# Patient Record
Sex: Male | Born: 1945 | Race: White | Hispanic: No | Marital: Married | Smoking: Former smoker
Health system: Southern US, Community
[De-identification: ages and names within clinical notes are randomized; demographics above are authoritative.]

## PROBLEM LIST (undated history)

## (undated) DIAGNOSIS — F329 Major depressive disorder, single episode, unspecified: Secondary | ICD-10-CM

## (undated) DIAGNOSIS — K219 Gastro-esophageal reflux disease without esophagitis: Secondary | ICD-10-CM

## (undated) DIAGNOSIS — M1990 Unspecified osteoarthritis, unspecified site: Secondary | ICD-10-CM

## (undated) DIAGNOSIS — M549 Dorsalgia, unspecified: Secondary | ICD-10-CM

## (undated) DIAGNOSIS — G8929 Other chronic pain: Secondary | ICD-10-CM

## (undated) DIAGNOSIS — F1910 Other psychoactive substance abuse, uncomplicated: Secondary | ICD-10-CM

## (undated) DIAGNOSIS — T4145XA Adverse effect of unspecified anesthetic, initial encounter: Secondary | ICD-10-CM

## (undated) DIAGNOSIS — N179 Acute kidney failure, unspecified: Secondary | ICD-10-CM

## (undated) DIAGNOSIS — I10 Essential (primary) hypertension: Secondary | ICD-10-CM

## (undated) DIAGNOSIS — C61 Malignant neoplasm of prostate: Secondary | ICD-10-CM

## (undated) DIAGNOSIS — Z5189 Encounter for other specified aftercare: Secondary | ICD-10-CM

## (undated) DIAGNOSIS — H269 Unspecified cataract: Secondary | ICD-10-CM

## (undated) DIAGNOSIS — I6529 Occlusion and stenosis of unspecified carotid artery: Secondary | ICD-10-CM

## (undated) DIAGNOSIS — H9190 Unspecified hearing loss, unspecified ear: Secondary | ICD-10-CM

## (undated) DIAGNOSIS — I639 Cerebral infarction, unspecified: Secondary | ICD-10-CM

## (undated) DIAGNOSIS — E785 Hyperlipidemia, unspecified: Secondary | ICD-10-CM

## (undated) DIAGNOSIS — F4310 Post-traumatic stress disorder, unspecified: Secondary | ICD-10-CM

## (undated) DIAGNOSIS — F319 Bipolar disorder, unspecified: Secondary | ICD-10-CM

## (undated) DIAGNOSIS — F32A Depression, unspecified: Secondary | ICD-10-CM

## (undated) DIAGNOSIS — G459 Transient cerebral ischemic attack, unspecified: Secondary | ICD-10-CM

## (undated) DIAGNOSIS — D649 Anemia, unspecified: Secondary | ICD-10-CM

## (undated) HISTORY — DX: Dorsalgia, unspecified: M54.9

## (undated) HISTORY — PX: COLONOSCOPY: SHX174

## (undated) HISTORY — DX: Unspecified hearing loss, unspecified ear: H91.90

## (undated) HISTORY — DX: Gastro-esophageal reflux disease without esophagitis: K21.9

## (undated) HISTORY — DX: Other psychoactive substance abuse, uncomplicated: F19.10

## (undated) HISTORY — DX: Encounter for other specified aftercare: Z51.89

## (undated) HISTORY — DX: Post-traumatic stress disorder, unspecified: F43.10

## (undated) HISTORY — DX: Occlusion and stenosis of unspecified carotid artery: I65.29

## (undated) HISTORY — DX: Hyperlipidemia, unspecified: E78.5

## (undated) HISTORY — PX: BACK SURGERY: SHX140

## (undated) HISTORY — DX: Unspecified cataract: H26.9

## (undated) HISTORY — PX: POLYPECTOMY: SHX149

## (undated) HISTORY — DX: Other chronic pain: G89.29

## (undated) HISTORY — PX: CARDIAC CATHETERIZATION: SHX172

## (undated) HISTORY — PX: EYE SURGERY: SHX253

## (undated) HISTORY — PX: PROSTATE SURGERY: SHX751

---

## 1998-07-05 DIAGNOSIS — T8859XA Other complications of anesthesia, initial encounter: Secondary | ICD-10-CM

## 1998-07-05 HISTORY — DX: Other complications of anesthesia, initial encounter: T88.59XA

## 2012-11-19 ENCOUNTER — Emergency Department (HOSPITAL_COMMUNITY)
Admission: EM | Admit: 2012-11-19 | Discharge: 2012-11-19 | Disposition: A | Discharge status: Home / Self Care | Payer: Medicare Other | Attending: Emergency Medicine | Admitting: Emergency Medicine

## 2012-11-19 ENCOUNTER — Encounter (HOSPITAL_COMMUNITY): Payer: Self-pay | Admitting: Family Medicine

## 2012-11-19 ENCOUNTER — Emergency Department (HOSPITAL_COMMUNITY): Payer: Medicare Other

## 2012-11-19 DIAGNOSIS — Z8546 Personal history of malignant neoplasm of prostate: Secondary | ICD-10-CM | POA: Insufficient documentation

## 2012-11-19 DIAGNOSIS — N453 Epididymo-orchitis: Secondary | ICD-10-CM | POA: Insufficient documentation

## 2012-11-19 DIAGNOSIS — Z8673 Personal history of transient ischemic attack (TIA), and cerebral infarction without residual deficits: Secondary | ICD-10-CM | POA: Insufficient documentation

## 2012-11-19 DIAGNOSIS — K219 Gastro-esophageal reflux disease without esophagitis: Secondary | ICD-10-CM | POA: Insufficient documentation

## 2012-11-19 DIAGNOSIS — N451 Epididymitis: Secondary | ICD-10-CM

## 2012-11-19 DIAGNOSIS — Z791 Long term (current) use of non-steroidal anti-inflammatories (NSAID): Secondary | ICD-10-CM | POA: Insufficient documentation

## 2012-11-19 DIAGNOSIS — Z880 Allergy status to penicillin: Secondary | ICD-10-CM | POA: Insufficient documentation

## 2012-11-19 DIAGNOSIS — N509 Disorder of male genital organs, unspecified: Secondary | ICD-10-CM | POA: Insufficient documentation

## 2012-11-19 DIAGNOSIS — N5089 Other specified disorders of the male genital organs: Secondary | ICD-10-CM | POA: Insufficient documentation

## 2012-11-19 DIAGNOSIS — Z87891 Personal history of nicotine dependence: Secondary | ICD-10-CM | POA: Insufficient documentation

## 2012-11-19 DIAGNOSIS — Z79899 Other long term (current) drug therapy: Secondary | ICD-10-CM | POA: Insufficient documentation

## 2012-11-19 DIAGNOSIS — F319 Bipolar disorder, unspecified: Secondary | ICD-10-CM | POA: Insufficient documentation

## 2012-11-19 DIAGNOSIS — I10 Essential (primary) hypertension: Secondary | ICD-10-CM | POA: Insufficient documentation

## 2012-11-19 HISTORY — DX: Major depressive disorder, single episode, unspecified: F32.9

## 2012-11-19 HISTORY — DX: Cerebral infarction, unspecified: I63.9

## 2012-11-19 HISTORY — DX: Gastro-esophageal reflux disease without esophagitis: K21.9

## 2012-11-19 HISTORY — DX: Malignant neoplasm of prostate: C61

## 2012-11-19 HISTORY — DX: Depression, unspecified: F32.A

## 2012-11-19 HISTORY — DX: Essential (primary) hypertension: I10

## 2012-11-19 HISTORY — DX: Bipolar disorder, unspecified: F31.9

## 2012-11-19 LAB — URINALYSIS, ROUTINE W REFLEX MICROSCOPIC
Bilirubin Urine: NEGATIVE
Hgb urine dipstick: NEGATIVE
Specific Gravity, Urine: 1.027 (ref 1.005–1.030)
pH: 6 (ref 5.0–8.0)

## 2012-11-19 MED ORDER — MORPHINE SULFATE 4 MG/ML IJ SOLN
4.0000 mg | Freq: Once | INTRAMUSCULAR | Status: AC
  Administered 2012-11-19: 4 mg via INTRAVENOUS
  Filled 2012-11-19: qty 1

## 2012-11-19 MED ORDER — SODIUM CHLORIDE 0.9 % IV BOLUS (SEPSIS)
1000.0000 mL | Freq: Once | INTRAVENOUS | Status: AC
  Administered 2012-11-19: 1000 mL via INTRAVENOUS

## 2012-11-19 MED ORDER — LEVOFLOXACIN 500 MG PO TABS: 500.0000 mg | ORAL_TABLET | Freq: Every day | ORAL | Status: AC

## 2012-11-19 NOTE — ED Notes (Signed)
Per pt sts groin swelling and testicle swelling since yesterday. sts also red and warm to touch.

## 2012-11-19 NOTE — ED Notes (Signed)
Pt states understanding of discharge instruction 

## 2012-11-19 NOTE — ED Provider Notes (Signed)
I saw and evaluated the patient, reviewed the resident's note and I agree with the findings and plan.  Patient with rather sudden pain to the side of right testicle that began yesterday, has gradually been worsening. No penile discharge, ulcerations, discharge. Patient has a history of prostate cancer status post prostatectomy. He reports no significant history of urinary tract infection. He denies any fever or chills. No abdominal pain. On exam, right scrotum is mildly swollen with area of erythema most in the proximal lateral portion of the scrotum. No tenderness along his perineum. His penis is normal. I doubt portion, suspect more likely a mild cellulitis versus epididymitis. No abscess is palpable. Plan is to obtain a ultrasound, likely will require antibiotics but I suspect the patient is stable for outpatient followup.    Jason [name]. Oletta Lamas, MD 11/19/12 1801

## 2012-11-19 NOTE — ED Provider Notes (Signed)
History     CSN: [id number]  Arrival date & time 11/19/12  1645   First MD Initiated Contact with Patient 11/19/12 1707      Chief Complaint  Patient presents with  . Groin Swelling    (Consider location/radiation/quality/duration/timing/severity/associated sxs/prior treatment) HPI Comments: 67 y.o. male who presents to the Er w/ the cc of right testicular pain and swelling. Started yesterday. He has not had fevers or chills. States that has noticed right testicular pain and redness overlying his right scrotum. He does not have hx of torsion or diabetes.   Patient is a 67 y.o. male presenting with general illness. The history is provided by the patient.  Illness  The current episode started yesterday. The onset was gradual. The problem occurs rarely. The problem has been unchanged. The problem is mild. Nothing relieves the symptoms. Nothing aggravates the symptoms. Pertinent negatives include no fever, no abdominal pain, no constipation, no diarrhea, no nausea, no vomiting, no headaches, no mouth sores, no neck pain, no wheezing, no rash, no eye discharge and no eye pain.    Past Medical History  Diagnosis Date  . Hypertension   . Stroke   . Prostate cancer   . Depression   . Bipolar 1 disorder   . Acid reflux disease with ulcer     Past Surgical History  Procedure Laterality Date  . Prostate surgery      History reviewed. No pertinent family history.  History  Substance Use Topics  . Smoking status: Former Games developer  . Smokeless tobacco: Not on file  . Alcohol Use: No      Review of Systems  Constitutional: Negative for fever, chills and activity change.  HENT: Negative for drooling, mouth sores and neck pain.   Eyes: Negative for pain and discharge.  Respiratory: Negative for chest tightness, shortness of breath and wheezing.   Gastrointestinal: Negative for nausea, vomiting, abdominal pain, diarrhea and constipation.  Genitourinary: Positive for scrotal swelling  and testicular pain. Negative for dysuria, flank pain and difficulty urinating.  Musculoskeletal: Negative for back pain and arthralgias.  Skin: Negative for color change, pallor and rash.  Neurological: Negative for dizziness, weakness, light-headedness and headaches.  Psychiatric/Behavioral: Negative for behavioral problems, confusion and agitation.    Allergies  Penicillins  Home Medications   Current Outpatient Rx  Name  Route  Sig  Dispense  Refill  . divalproex (DEPAKOTE ER) 250 MG 24 hr tablet   Oral   Take 1,750 mg by mouth every evening.         Marland Kitchen FLUoxetine (PROZAC) 20 MG capsule   Oral   Take 60 mg by mouth daily.         Marland Kitchen glucosamine-chondroitin 500-400 MG tablet   Oral   Take 1 tablet by mouth 3 (three) times daily.         Marland Kitchen HYDROcodone-acetaminophen (NORCO/VICODIN) 5-325 MG per tablet   Oral   Take 2 tablets by mouth every 8 (eight) hours as needed for pain.         Marland Kitchen lisinopril-hydrochlorothiazide (PRINZIDE,ZESTORETIC) 20-12.5 MG per tablet   Oral   Take 1 tablet by mouth daily.         . mirtazapine (REMERON) 45 MG tablet   Oral   Take 45 mg by mouth at bedtime.         . niacin (SLO-NIACIN) 500 MG tablet   Oral   Take 1,000 mg by mouth at bedtime.         Marland Kitchen  omeprazole (PRILOSEC) 20 MG capsule   Oral   Take 40 mg by mouth daily.         . salsalate (DISALCID) 750 MG tablet   Oral   Take 1,500 mg by mouth 2 (two) times daily.         . simvastatin (ZOCOR) 80 MG tablet   Oral   Take 120 mg by mouth at bedtime.           BP 133/82  Pulse 101  Temp(Src) 97.9 F (36.6 C) (Oral)  Resp 18  SpO2 96%  Physical Exam  Constitutional: He is oriented to person, place, and time. He appears well-developed. No distress.  HENT:  Head: Normocephalic.  Eyes: Pupils are equal, round, and reactive to light. Right eye exhibits no discharge. Left eye exhibits no discharge.  Neck: Neck supple. No tracheal deviation present.   Cardiovascular: Regular rhythm and intact distal pulses.   Pulmonary/Chest: Effort normal. No stridor. No respiratory distress. He has no wheezes.  Abdominal: Soft. He exhibits no distension. There is no tenderness. There is no rebound.  Genitourinary:  Right testicle is ttp, he has increased fullness of his right scrotum, with overlying erythema on right side of scrotum.   Musculoskeletal: Normal range of motion. He exhibits no tenderness.  Neurological: He is alert and oriented to person, place, and time. No cranial nerve deficit.  Skin: Skin is warm. No rash noted. He is not diaphoretic. No erythema.    ED Course  Procedures (including critical care time)  Labs Reviewed  GC/CHLAMYDIA PROBE AMP  URINALYSIS, ROUTINE W REFLEX MICROSCOPIC   US Scrotum  11/19/2012   *RADIOLOGY REPORT*  Clinical Data:  Right-sided scrotal pain and swelling.  SCROTAL ULTRASOUND DOPPLER ULTRASOUND OF THE TESTICLES  Technique: Complete ultrasound examination of the testicles, epididymis, and other scrotal structures was performed.  Color and spectral Doppler ultrasound were also utilized to evaluate blood flow to the testicles.  Comparison:  None  Findings:  Right testis:  3.4 x 2.0 x 2.3 cm.  No evidence of testicular mass or microlithiasis. Internal blood flow is seen on color Doppler ultrasound.  Left testis:  3.6 x 2.2 x 2.3 cm.  No evidence of testicular mass or microlithiasis.  Internal blood flow is seen on color Doppler ultrasound.  Right epididymis:  Enlarged right epididymis is seen with increased blood flow on color Doppler ultrasound, consistent with epididymitis.  Multiple epididymal cysts or spermatoceles also noted.  Left epididymis:  Normal in size.  No increased blood flow seen. Several tiny less than 1 cm epididymal cysts or spermatoceles noted.  Hydrocele:  Moderate right and small left hydrocele is noted.  Varicocele:  None visualized  Pulsed Doppler interrogation of both testes demonstrates low  resistance flow bilaterally.  IMPRESSION:  1.  No evidence of testicular mass or torsion. 2.  Right-sided epididymitis. 3.  Moderate right and small left hydroceles.   Original Report Authenticated By: Myles Rosenthal, M.D.   [place] Art/ven Flow Abd Pelv Doppler  11/19/2012   *RADIOLOGY REPORT*  Clinical Data:  Right-sided scrotal pain and swelling.  SCROTAL ULTRASOUND DOPPLER ULTRASOUND OF THE TESTICLES  Technique: Complete ultrasound examination of the testicles, epididymis, and other scrotal structures was performed.  Color and spectral Doppler ultrasound were also utilized to evaluate blood flow to the testicles.  Comparison:  None  Findings:  Right testis:  3.4 x 2.0 x 2.3 cm.  No evidence of testicular mass or microlithiasis. Internal blood flow is seen on  color Doppler ultrasound.  Left testis:  3.6 x 2.2 x 2.3 cm.  No evidence of testicular mass or microlithiasis.  Internal blood flow is seen on color Doppler ultrasound.  Right epididymis:  Enlarged right epididymis is seen with increased blood flow on color Doppler ultrasound, consistent with epididymitis.  Multiple epididymal cysts or spermatoceles also noted.  Left epididymis:  Normal in size.  No increased blood flow seen. Several tiny less than 1 cm epididymal cysts or spermatoceles noted.  Hydrocele:  Moderate right and small left hydrocele is noted.  Varicocele:  None visualized  Pulsed Doppler interrogation of both testes demonstrates low resistance flow bilaterally.  IMPRESSION:  1.  No evidence of testicular mass or torsion. 2.  Right-sided epididymitis. 3.  Moderate right and small left hydroceles.   Original Report Authenticated By: Myles Rosenthal, M.D.     MDM  Suspect epidymidis verse variocele, lower suspicion for torsion -- however, will get U/S to rule out torsion and to further eval right testicular pain. No signs of necrotizing fascitis on exam, no crepitus appreciated.   U/S does not show torsion, shows epididymitis. Will tx w/ levofloxacin,  pt does not have prostate (s/p prostatectomy). Have told to f/u with his pcp within 2 days for re-evaluation of symptoms.    1. Epididymitis, right            Bernadene Person, MD 11/19/12 2012

## 2013-02-12 ENCOUNTER — Encounter (HOSPITAL_COMMUNITY): Payer: Self-pay | Admitting: Emergency Medicine

## 2013-02-12 ENCOUNTER — Inpatient Hospital Stay (HOSPITAL_COMMUNITY)
Admission: EM | Admit: 2013-02-12 | Discharge: 2013-02-14 | DRG: 195 | Disposition: A | Discharge status: Home / Self Care | Payer: Medicare Other | Attending: Family Medicine | Admitting: Family Medicine

## 2013-02-12 ENCOUNTER — Emergency Department (HOSPITAL_COMMUNITY): Payer: Medicare Other

## 2013-02-12 DIAGNOSIS — Z8546 Personal history of malignant neoplasm of prostate: Secondary | ICD-10-CM

## 2013-02-12 DIAGNOSIS — F329 Major depressive disorder, single episode, unspecified: Secondary | ICD-10-CM | POA: Diagnosis present

## 2013-02-12 DIAGNOSIS — I10 Essential (primary) hypertension: Secondary | ICD-10-CM | POA: Diagnosis present

## 2013-02-12 DIAGNOSIS — K219 Gastro-esophageal reflux disease without esophagitis: Secondary | ICD-10-CM | POA: Diagnosis present

## 2013-02-12 DIAGNOSIS — R112 Nausea with vomiting, unspecified: Secondary | ICD-10-CM | POA: Diagnosis present

## 2013-02-12 DIAGNOSIS — Z880 Allergy status to penicillin: Secondary | ICD-10-CM

## 2013-02-12 DIAGNOSIS — I69993 Ataxia following unspecified cerebrovascular disease: Secondary | ICD-10-CM

## 2013-02-12 DIAGNOSIS — Z9181 History of falling: Secondary | ICD-10-CM

## 2013-02-12 DIAGNOSIS — Z8673 Personal history of transient ischemic attack (TIA), and cerebral infarction without residual deficits: Secondary | ICD-10-CM

## 2013-02-12 DIAGNOSIS — E785 Hyperlipidemia, unspecified: Secondary | ICD-10-CM | POA: Diagnosis present

## 2013-02-12 DIAGNOSIS — M7918 Myalgia, other site: Secondary | ICD-10-CM | POA: Diagnosis present

## 2013-02-12 DIAGNOSIS — F32A Depression, unspecified: Secondary | ICD-10-CM | POA: Diagnosis present

## 2013-02-12 DIAGNOSIS — J189 Pneumonia, unspecified organism: Principal | ICD-10-CM | POA: Diagnosis present

## 2013-02-12 DIAGNOSIS — R2689 Other abnormalities of gait and mobility: Secondary | ICD-10-CM | POA: Diagnosis present

## 2013-02-12 DIAGNOSIS — Z87891 Personal history of nicotine dependence: Secondary | ICD-10-CM

## 2013-02-12 DIAGNOSIS — F319 Bipolar disorder, unspecified: Secondary | ICD-10-CM | POA: Diagnosis present

## 2013-02-12 LAB — COMPREHENSIVE METABOLIC PANEL
Albumin: 3.1 g/dL — ABNORMAL LOW (ref 3.5–5.2)
Alkaline Phosphatase: 88 U/L (ref 39–117)
BUN: 26 mg/dL — ABNORMAL HIGH (ref 6–23)
Chloride: 100 mEq/L (ref 96–112)
GFR calc Af Amer: 75 mL/min — ABNORMAL LOW (ref >90)
Glucose, Bld: 120 mg/dL — ABNORMAL HIGH (ref 70–99)
Potassium: 3.6 mEq/L (ref 3.5–5.1)
Total Bilirubin: 0.2 mg/dL — ABNORMAL LOW (ref 0.3–1.2)

## 2013-02-12 LAB — CREATININE, SERUM
Creatinine, Ser: 1.06 mg/dL (ref 0.50–1.35)
GFR calc Af Amer: 83 mL/min — ABNORMAL LOW (ref >90)
GFR calc non Af Amer: 72 mL/min — ABNORMAL LOW (ref >90)

## 2013-02-12 LAB — CBC WITH DIFFERENTIAL/PLATELET
Basophils Relative: 0 % (ref 0–1)
Eosinophils Absolute: 0.1 10*3/uL (ref 0.0–0.7)
Eosinophils Relative: 1 % (ref 0–5)
Hemoglobin: 12.6 g/dL — ABNORMAL LOW (ref 13.0–17.0)
MCH: 32.1 pg (ref 26.0–34.0)
MCHC: 34.2 g/dL (ref 30.0–36.0)
MCV: 93.9 fL (ref 78.0–100.0)
Monocytes Absolute: 1.5 10*3/uL — ABNORMAL HIGH (ref 0.1–1.0)
Monocytes Relative: 12 % (ref 3–12)
Neutrophils Relative %: 76 % (ref 43–77)

## 2013-02-12 LAB — CBC
Hemoglobin: 11.8 g/dL — ABNORMAL LOW (ref 13.0–17.0)
RBC: 3.91 MIL/uL — ABNORMAL LOW (ref 4.22–5.81)
WBC: 11.9 10*3/uL — ABNORMAL HIGH (ref 4.0–10.5)

## 2013-02-12 LAB — URINALYSIS, DIPSTICK ONLY
Ketones, ur: 15 mg/dL — AB
Nitrite: NEGATIVE
Protein, ur: NEGATIVE mg/dL
Urobilinogen, UA: 1 mg/dL (ref 0.0–1.0)
pH: 5 (ref 5.0–8.0)

## 2013-02-12 LAB — POCT I-STAT TROPONIN I: Troponin i, poc: 0 ng/mL (ref 0.00–0.08)

## 2013-02-12 MED ORDER — SODIUM CHLORIDE 0.9 % IV SOLN
INTRAVENOUS | Status: DC
  Administered 2013-02-12 – 2013-02-14 (×5): via INTRAVENOUS

## 2013-02-12 MED ORDER — LEVOFLOXACIN IN D5W 750 MG/150ML IV SOLN
750.0000 mg | INTRAVENOUS | Status: DC
  Administered 2013-02-13: 750 mg via INTRAVENOUS
  Filled 2013-02-12 (×2): qty 150

## 2013-02-12 MED ORDER — MIRTAZAPINE 45 MG PO TABS
45.0000 mg | ORAL_TABLET | Freq: Every day | ORAL | Status: DC
  Administered 2013-02-12 – 2013-02-13 (×2): 45 mg via ORAL
  Filled 2013-02-12 (×3): qty 1

## 2013-02-12 MED ORDER — DOCUSATE SODIUM 100 MG PO CAPS
100.0000 mg | ORAL_CAPSULE | Freq: Two times a day (BID) | ORAL | Status: DC | PRN
  Filled 2013-02-12: qty 1

## 2013-02-12 MED ORDER — FLUOXETINE HCL 20 MG PO CAPS
60.0000 mg | ORAL_CAPSULE | Freq: Every day | ORAL | Status: DC
  Administered 2013-02-12 – 2013-02-14 (×3): 60 mg via ORAL
  Filled 2013-02-12 (×3): qty 3

## 2013-02-12 MED ORDER — HYDROCODONE-ACETAMINOPHEN 5-325 MG PO TABS
2.0000 | ORAL_TABLET | Freq: Three times a day (TID) | ORAL | Status: DC | PRN
  Administered 2013-02-12: 2 via ORAL
  Filled 2013-02-12: qty 2

## 2013-02-12 MED ORDER — LISINOPRIL-HYDROCHLOROTHIAZIDE 20-12.5 MG PO TABS: 1.0000 | ORAL_TABLET | Freq: Every day | ORAL | Status: DC

## 2013-02-12 MED ORDER — SODIUM CHLORIDE 0.9 % IV BOLUS (SEPSIS)
1000.0000 mL | Freq: Once | INTRAVENOUS | Status: AC
  Administered 2013-02-12: 1000 mL via INTRAVENOUS

## 2013-02-12 MED ORDER — SODIUM CHLORIDE 0.9 % IV SOLN
INTRAVENOUS | Status: AC
  Administered 2013-02-12: 18:00:00 via INTRAVENOUS

## 2013-02-12 MED ORDER — SODIUM CHLORIDE 0.9 % IJ SOLN
3.0000 mL | Freq: Two times a day (BID) | INTRAMUSCULAR | Status: DC
  Administered 2013-02-12: 3 mL via INTRAVENOUS

## 2013-02-12 MED ORDER — LISINOPRIL 20 MG PO TABS
20.0000 mg | ORAL_TABLET | Freq: Every day | ORAL | Status: DC
  Administered 2013-02-12 – 2013-02-14 (×3): 20 mg via ORAL
  Filled 2013-02-12 (×3): qty 1

## 2013-02-12 MED ORDER — BIOTENE DRY MOUTH MT LIQD
15.0000 mL | Freq: Two times a day (BID) | OROMUCOSAL | Status: DC
  Administered 2013-02-13 – 2013-02-14 (×2): 15 mL via OROMUCOSAL

## 2013-02-12 MED ORDER — HEPARIN SODIUM (PORCINE) 5000 UNIT/ML IJ SOLN
5000.0000 [IU] | Freq: Three times a day (TID) | INTRAMUSCULAR | Status: DC
  Administered 2013-02-12 – 2013-02-14 (×7): 5000 [IU] via SUBCUTANEOUS
  Filled 2013-02-12 (×9): qty 1

## 2013-02-12 MED ORDER — ACETAMINOPHEN 650 MG RE SUPP: 650.0000 mg | Freq: Four times a day (QID) | RECTAL | Status: DC | PRN

## 2013-02-12 MED ORDER — ASPIRIN EC 81 MG PO TBEC
81.0000 mg | DELAYED_RELEASE_TABLET | Freq: Every day | ORAL | Status: DC
  Administered 2013-02-12 – 2013-02-14 (×3): 81 mg via ORAL
  Filled 2013-02-12 (×3): qty 1

## 2013-02-12 MED ORDER — DIVALPROEX SODIUM ER 500 MG PO TB24
1750.0000 mg | ORAL_TABLET | Freq: Every evening | ORAL | Status: DC
  Administered 2013-02-12 – 2013-02-13 (×2): 1750 mg via ORAL
  Filled 2013-02-12 (×3): qty 1

## 2013-02-12 MED ORDER — ONDANSETRON HCL 4 MG/2ML IJ SOLN: 4.0000 mg | Freq: Four times a day (QID) | INTRAMUSCULAR | Status: DC | PRN

## 2013-02-12 MED ORDER — ASPIRIN 325 MG PO TABS
325.0000 mg | ORAL_TABLET | Freq: Every day | ORAL | Status: DC
  Administered 2013-02-12: 325 mg via ORAL
  Filled 2013-02-12: qty 1

## 2013-02-12 MED ORDER — ONDANSETRON HCL 4 MG PO TABS: 4.0000 mg | ORAL_TABLET | Freq: Four times a day (QID) | ORAL | Status: DC | PRN

## 2013-02-12 MED ORDER — SALSALATE 750 MG PO TABS
1500.0000 mg | ORAL_TABLET | Freq: Two times a day (BID) | ORAL | Status: DC
  Administered 2013-02-12 – 2013-02-14 (×4): 1500 mg via ORAL
  Filled 2013-02-12 (×5): qty 2

## 2013-02-12 MED ORDER — ATORVASTATIN CALCIUM 40 MG PO TABS
40.0000 mg | ORAL_TABLET | Freq: Every day | ORAL | Status: DC
  Administered 2013-02-12 – 2013-02-13 (×2): 40 mg via ORAL
  Filled 2013-02-12 (×3): qty 1

## 2013-02-12 MED ORDER — ACETAMINOPHEN 325 MG PO TABS: 650.0000 mg | ORAL_TABLET | Freq: Four times a day (QID) | ORAL | Status: DC | PRN

## 2013-02-12 MED ORDER — PNEUMOCOCCAL VAC POLYVALENT 25 MCG/0.5ML IJ INJ
0.5000 mL | INJECTION | INTRAMUSCULAR | Status: AC
  Administered 2013-02-13: 0.5 mL via INTRAMUSCULAR
  Filled 2013-02-12: qty 0.5

## 2013-02-12 MED ORDER — HYDROCHLOROTHIAZIDE 12.5 MG PO CAPS
12.5000 mg | ORAL_CAPSULE | Freq: Every day | ORAL | Status: DC
  Administered 2013-02-12 – 2013-02-14 (×3): 12.5 mg via ORAL
  Filled 2013-02-12 (×3): qty 1

## 2013-02-12 MED ORDER — LEVOFLOXACIN IN D5W 750 MG/150ML IV SOLN
750.0000 mg | Freq: Once | INTRAVENOUS | Status: AC
  Administered 2013-02-12: 750 mg via INTRAVENOUS
  Filled 2013-02-12: qty 150

## 2013-02-12 MED ORDER — CHLORHEXIDINE GLUCONATE 0.12 % MT SOLN
15.0000 mL | Freq: Two times a day (BID) | OROMUCOSAL | Status: DC
  Administered 2013-02-12 – 2013-02-14 (×4): 15 mL via OROMUCOSAL
  Filled 2013-02-12 (×6): qty 15

## 2013-02-12 MED ORDER — PANTOPRAZOLE SODIUM 40 MG PO TBEC
80.0000 mg | DELAYED_RELEASE_TABLET | Freq: Every day | ORAL | Status: DC
  Administered 2013-02-12 – 2013-02-14 (×3): 80 mg via ORAL
  Filled 2013-02-12 (×3): qty 2

## 2013-02-12 NOTE — ED Notes (Signed)
ECG given to Dr. Rubin Payor, no older copy

## 2013-02-12 NOTE — ED Notes (Signed)
Wife stated in the waiting area that at times he was sweating profusely and feeling like he was going to fall.

## 2013-02-12 NOTE — Care Management Note (Signed)
    Page 1 of 2   02/15/2013     10:39:27 AM   CARE MANAGEMENT NOTE 02/15/2013  Patient:  Jason [name],Jason [name]   Account Number:  [id number]  Date Initiated:  02/12/2013  Documentation initiated by:  Letha Cape  Subjective/Objective Assessment:   dx pna, n/v  admit- lives with spouse.     Action/Plan:   pt eval- rec hhpt and rolling walker   Anticipated DC Date:  02/14/2013   Anticipated DC Plan:  HOME W HOME HEALTH SERVICES      DC Planning Services  CM consult      [facility] Choice  HOME HEALTH   Choice offered to / List presented to:  C-1 Patient   DME arranged  Levan Hurst      DME agency  [facility]     HH arranged  HH-2 PT      [facility] agency  [facility]   Status of service:  Completed, signed off Medicare Important Message given?   (If response is "NO", the following Medicare IM given date fields will be blank) Date Medicare IM given:   Date Additional Medicare IM given:    Discharge Disposition:  HOME W HOME HEALTH SERVICES  Per UR Regulation:  Reviewed for med. necessity/level of care/duration of stay  If discussed at Long Length of Stay Meetings, dates discussed:    Comments:  02/15/13 10:28 Letha Cape RN, BSN [phone] patient dc to home, [facility] notified.  I spoke with wife she states they did not get their walker, informed her to go to the [facility]. [facility] store to pick it up , Jill Alexanders will be sending the order over for them, it [facility] deliver this to them they will charge $75.  Wife states she will go to [facility]. store to pick it up.  02/14/13 11:24 Letha Cape RN BSN [phone] patient lives with spouse and daughter, patient will need HHPT, patient chose [facility] from agency list, referral made to [facility] and order in for rolling walker.  Soc will begin 24-48 hrs post discharge.

## 2013-02-12 NOTE — Progress Notes (Signed)
PGY-2 Interim Note UPPER LEVEL COSIGN/ADDENDUM TO H&P PENDING, DELAYED DUE TO DIRECT PATIENT CARE OF ANOTHER, CRITICALLY ILL PATIENT.  Paged by RN. New stroke is on pt's differential but no evidence of new neuro deficit on exam and CT is negative for acute findings. Per RN, 5W is not a stroke-certified floor and thus she felt rapid response would be more appropriate for stroke swallow screen. Per rapid response, SLP may be more appropriate for formal swallow evaluation. Will place order for NPO, sips with meds, and SLP swallow eval.  Bobbye Morton, MD PGY-2, [facility] 02/12/2013, 6:41 PM FPTS Service pager: [phone] (text pages welcome through [facility])

## 2013-02-12 NOTE — H&P (Signed)
Family Medicine Teaching [facility] Admission History and Physical Service Pager: [phone]  Patient name: Jason [name] Medical record number: [id number] Date of birth: Nov 21, 1945 Age: 67 y.o. Gender: male  Primary Care Provider: No primary provider on file. Consultants: none  Code Status: Full Code  Chief Complaint: nausea and vomiting x 4d, right sided back pain, cough, balance issues  Assessment and Plan: Jason [name] is a 67 y.o. 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 PMH is significant for a right cerebellar stroke in 2005 with subsequent chronic balance issues with a recent increase in falls without known trauma. Also he has been previously diagnosed with and treated for bipolar disorder, HTN, GERD/PUD, prostate cancer, as well as a heart catheterization "for a blockage" but has never had surgery and is only on a daily aspirin.   1. Right Upper lobe pneumonia- CXR shows upper lobe consolidation. Pt has non productive cough. Was given one dose of Levaquin 750 mg PO, and this will be continued during his hospital stay.   2. Back pain, NOS- monitor symptoms, Continue Acetaminophen for now. Pain is worse with deep inspiration. Has been coughing and pain may be musculoskeletal related to repetat ive coughing, and patient also has a history of back pain, of which this may be an exacerbation. May consider Xray in the future if pain worsens.   3. Ataxia- at this time CT shows no new or worsening of old cerebellar infarct, but balance issues are most likely chronic that have been exacerbated by an acute illness.   4. Nausea/Emesis- treat with zofran 4 mg PO q6h PRN   FEN/GI: IV NS @100ml /hr / NPO pending swallow ability clearance  / Protonix 80 mg PO daily  Prophylaxis: Heparin SQ  Disposition: home pending medical clearance  History of Present  Illness: Jason [name] is a 67 y.o. year old male presenting with a 4-5 day history of nausea and vomiting that has been worsening. He was described by his wife as being sweaty, gray, and weak last night and he has fallen several times over the past week. He has some dizziness. He also has had worsening dyspnea on exertion and a cough that has been worsening lately as well, without production of sputum. His wife stated patient had a low grade fever "the other night" of 99.9 and he had fevers and chills. His wife noted that their granddaughter has had "strep throat" recently, but no one else in the family has been sick. He has been able to walk, dress himself since recovering from his 2005 stroke. He also notes increased urination frequency with slightly less control with a few episodes of dribbling and possible bowel incontinence, but this was not made clear by patient. He has had a poor appetite over the last several days. His past medical history includes a right cerebellar stroke in 2005 with subsequent chronic balance issues with a recent increase in falls without known trauma. Also he has been previously diagnosed with and treated for bipolar disorder, HTN, GERD/PUD, prostate cancer, as well as a heart catheterization "for a blockage" but has never had surgery and is only on a daily aspirin.   Review Of Systems: Per HPI with the following additions: Denies abdominal pain, denies N/B/T in extremities.  Otherwise 12 point review of systems was performed and was unremarkable.  There are no active problems to display for this patient.  Past Medical History: Past Medical History  Diagnosis Date  . Hypertension   . Stroke   . Prostate cancer   . Depression   . Bipolar 1 disorder   . Acid reflux disease with ulcer    Past Surgical History: Past Surgical History  Procedure Laterality Date  . Prostate surgery     Social History: History  Substance Use Topics  . Smoking status: Former Smoker     Quit date: 03/06/2003  . Smokeless tobacco: Not on file  . Alcohol Use: No   Additional social history: none  Please also refer to relevant sections of EMR.  Family History: No family history on file. Allergies and Medications: Allergies  Allergen Reactions  . Penicillins     Unknown reaction   No current facility-administered medications on file prior to encounter.   Current Outpatient Prescriptions on File Prior to Encounter  Medication Sig Dispense Refill  . divalproex (DEPAKOTE ER) 250 MG 24 hr tablet Take 1,750 mg by mouth every evening.      Marland Kitchen FLUoxetine (PROZAC) 20 MG capsule Take 60 mg by mouth daily.      Marland Kitchen glucosamine-chondroitin 500-400 MG tablet Take 1 tablet by mouth 2 (two) times daily.       Marland Kitchen HYDROcodone-acetaminophen (NORCO/VICODIN) 5-325 MG per tablet Take 2 tablets by mouth every 8 (eight) hours as needed for pain.      Marland Kitchen lisinopril-hydrochlorothiazide (PRINZIDE,ZESTORETIC) 20-12.5 MG per tablet Take 1 tablet by mouth daily.      . mirtazapine (REMERON) 45 MG tablet Take 45 mg by mouth at bedtime.      . niacin (SLO-NIACIN) 500 MG tablet Take 1,000 mg by mouth at bedtime.      Marland Kitchen omeprazole (PRILOSEC) 20 MG capsule Take 40 mg by mouth daily.      . salsalate (DISALCID) 750 MG tablet Take 1,500 mg by mouth 2 (two) times daily.      . simvastatin (ZOCOR) 80 MG tablet Take 40 mg by mouth at bedtime.         Objective: BP 93/53  Pulse 92  Temp(Src) 99.8 F (37.7 C) (Oral)  Resp 18  Ht 5\' 4"  (1.626 m)  Wt 179 lb 6.4 oz (81.375 kg)  BMI 30.78 kg/m2  SpO2 93% Exam: General: NAD, well nourished, AAO x3.  HEENT:   Head: atraumatic and normocephalic  Mouth: poor dentition with malodorous breath  Eyes: PERRLA, EOMI, sclera non-icteric  Throat: Slightly erythematous without exudate or petechiae.   Neck: No thyromegaly or lymphadenopathy noted on palpation.   Cardiovascular: RRR, no m/r/g Respiratory: Right sided upper and lower course lung sounds Abdomen: NBS,  Soft/NT/ND, no guarding.  Extremities: no cyanosis, clubbing, or edema, pulses 2+ b/l upper and lower extremiteis Neuro: CN2-12 grossly intact, strength 5/5 upper and lower extremity b/l, mild dysmetria with finger to nose test, right slightly worse than left. Heel-to-shin test normal b/l, no dysdiadokinesis noted.  Skin: No lesions, rashes, or wounds noted on limited exam.  MSK: Tenderness to palpation of right sided mid-thoracic region of back, reproducing patient's pain.   Labs and Imaging: CBC BMET   Recent Labs Lab 02/12/13 1831  WBC 11.9*  HGB 11.8*  HCT 36.7*  PLT 199    Recent Labs Lab 02/12/13 1049 02/12/13 1831  NA 139  --   K 3.6  --   CL 100  --   CO2 25  --   BUN 26*  --   CREATININE 1.15 1.06  GLUCOSE 120*  --  CALCIUM 9.6  --      8.11.14 Head CT-scan IMPRESSION:  Atrophy and chronic microvascular ischemic changes.  Remote right inferior cerebellar infarct.  No acute finding by noncontrast CT.  Original Report Authenticated By: Judie Petit. Shick, M.D.  8.11.14 Chest X-Ray IMPRESSION:  Patchy opacity in the right upper lobe may reflect pneumonia in the  appropriate clinical setting.  An underlying pulmonary nodule is difficult to exclude. Recommend  imaging followup to resolution.  Original Report Authenticated By: Malachy Moan, M.D.  Stephanie Coup Somnang Mahan, MD 02/13/2013, 12:21 AM MS-4, [facility] FPTS Intern pager: [phone], text pages welcome  FPTS Upper-Level Resident Addendum Physical Exam: General: adult male in NAD HEENT: [place]/AT, poor dentition but MMM, throat erythematous but otherwise normal  PERRLA, EOMI Cardiovascular: RRR, no m/r/g Respiratory: coarse breath sounds diffusely in right lung fields, upper field with  Abdomen: soft, nontender, BS+  Extremities: no cyanosis/clubbing/edema, distal pulses intact/symmetric Neuro: CN2-12 grossly intact, strength 5/5 in all four extremities  mild dysmetria with finger to nose test,  question subtle intention tremor; no tremor at rest   Heel-to-shin normal bilaterally, normal ability of rapid-alternating movements of hands  Skin: warm, dry, intact, no frank rashes MSK: mild/moderate reproducible chest wall pain right side of mid-back  A/P: Kelvyn Schunk is a 67 y.o. year old male presenting with nausea/vomiting, cough, right sided back pain, and increased frequency of falls at home/worsening chronic balance issues. PMH is significant for a right cerebellar stroke in 2005 with subsequent chronic balance issues, bipolar disorder, HTN, GERD/PUD, prostate cancer (treated with "some kind of surgery"); pt has had heart cath in the past but is only on a daily aspirin  #Right Upper lobe pneumonia - noted on CXR; question acute illness worsening chronic balance/other issues -no hypoxia noted, normal WOB and no respiratory distress at time of admission -s/p Levaquin x1 in the ED, will continue daily for now -could consider alternative therapy if aspiration is a concern (uncertain if PNA caused/worsened current symptoms or developed subsequently with cough, etc) [ ]  f/u resp status  #Hx of cerebellar stroke, ?chronic vs ?acute ataxia - CT shows no obvious new/worsening infarct -possible worsening baseline symptoms due to acute illness -will need to evaluate functioning once PNA starts to clear [ ]  f/u PT/OT [ ]  consider SW consult if SNF/acute rehab recommended; pt agreeable  #Back pain, appears MSK - worse with inspiration, possibly related to PNA itself, cough, or falls, or combination -continue Tylenol PRN, home Norco  #Bipolar - at baseline, on Depakote, Prozac, Remeron -continue home meds; may need to consider reduction/hold to see if contributing to falls  #HTN - Doubt new stroke, as above, and BP generally normotensive on home meds -continue home HCTZ, lisinopril  #HLD/hx of stroke - continue home statin (Lipitor sub for Zocor)  #GERD - continue home salsalate,  Protonix substituted for home omeprazole  #Nausea/Emesis - uncertain if due to pneumonia or if pt may have had mild GI/URI-type illness, which has then lead to a new pneumonia - monitor clinically - Zofran PRN   #FEN/GI: IV NS @100ml /h, NPO pending SLP eval (see interim note by Dr. Casper Harrison) #Prophylaxis: subQ heparin #Follow-up: Pt currently seen at the [facility], though interested in f/u with [facility] urgent/primary care office  DISPO - Management as above, admitting to telemetry bed, attending Dr. McDiarmid  -anticipate discharge home in 1-2 days pending improvement in symptoms, vs short-term rehab/SNF  I have independently interviewed and examined the patient. I have discussed the  above with Mr. Blondell Reveal and agree with his documentation as above. The above addendum reflects my independent exam and assessment/plan, with any edits for correction/additions/clarification to Mr. Kristine Garbe original note in orange. Please see also any attending notes.  Bobbye Morton, MD PGY-2, [facility] FPTS Service pager: [phone] (text pages welcome through [facility])

## 2013-02-12 NOTE — ED Notes (Signed)
Pt. Stated, i started having N/V and back pain to the point were I can't hardly cough.  All this started last Wednesday.

## 2013-02-12 NOTE — Progress Notes (Signed)
Jason [name] [id number] Admission Data: 02/12/2013 6:45 PM Attending Provider: Leighton Roach McDiarmid, MD  PCP:No primary provider on file. Consults/ Treatment Team:    Jason [name] is a 67 y.o. male patient admitted from ED awake, alert  & orientated  X 3,  No Order, VSS - Blood pressure 130/74, pulse 87, temperature 98.2 F (36.8 C), temperature source Oral, resp. rate 20, height 5\' 4"  (1.626 m), weight 81.375 kg (179 lb 6.4 oz), SpO2 97.00%., no c/o shortness of breath, no c/o chest pain, no distress noted. Tele #06 placed and pt is currently running:normal sinus rhythm.   IV site WDL:  wrist left, condition patent and no redness with a transparent dsg that's clean dry and intact.  Allergies:   Allergies  Allergen Reactions  . Penicillins     Unknown reaction     Past Medical History  Diagnosis Date  . Hypertension   . Stroke   . Prostate cancer   . Depression   . Bipolar 1 disorder   . Acid reflux disease with ulcer     History:  obtained from the patient. Tobacco/alcohol: denied none  Pt orientation to unit, room and routine. Information packet given to patient/family and safety video watched.  Admission INP armband ID verified with patient/family, and in place. SR up x 2, fall risk assessment complete with Patient and family verbalizing understanding of risks associated with falls. Pt verbalizes an understanding of how to use the call bell and to call for help before getting out of bed.  Skin, clean-dry- intact without evidence of bruising, or skin tears.   No evidence of skin break down noted on exam. no rashes, no ecchymoses    Will cont to monitor and assist as needed.  Jason [name] Consuella Lose, RN 02/12/2013 6:45 PM

## 2013-02-12 NOTE — ED Provider Notes (Signed)
CSN: [id number]     Arrival date & time 02/12/13  1018 History     First MD Initiated Contact with Patient 02/12/13 1037     Chief Complaint  Patient presents with  . Nausea  . Emesis   (Consider location/radiation/quality/duration/timing/severity/associated sxs/prior Treatment) HPI Comments: This a 67 year old man with history of CVA and hypertension who presents with nausea, vomiting, left-sided back pain, and recurrent falls. History was taken from the patient and his wife.  Per the wife, the patient had 2 falls last Wednesday night. The patient describes getting off balance and falling. He denies any syncope. Patient has a history of a cerebellar stroke and has fallen in the past.  The patient states that since last Wednesday he has had multiple falls and worsening left-sided back pain with nausea. This morning he woke up and "couldn't take it anymore."   He has had multiple episodes of nonbilious, nonbloody emesis.  Regarding the patient's back pain, the patient states that it is nonradiating.  It was worsened with movement and coughing makes it worse. He states he had a back pain prior to his falls.   He denies any chest pain or shortness of breath.  Non-productive cough.  Denies fever with a tmax of 99.9. He did have a cardiac catheterization in 2007 and was told "he has some blockage." He was not intervened upon at that time.  The patient denies any focal weakness or numbness but states that he feels that when he gets off balance he tends to fall to the right.  The wife states that the patient also appears to be having staring spells. She states that he is slow to respond during these episodes they last for a few seconds at a time. He is not appear to be postictal afterwards.  Patient is a 67 y.o. male presenting with vomiting. The history is provided by the patient and a significant other.  Emesis Severity:  Moderate Duration:  5 days Timing:  Intermittent Emesis appearance: nonbloody,  nonbilious. Able to tolerate:  Liquids and solids Progression:  Worsening Chronicity:  New Recent urination:  Normal Relieved by:  Nothing Worsened by:  Nothing tried Associated symptoms: chills   Associated symptoms: no headaches     Past Medical History  Diagnosis Date  . Hypertension   . Stroke   . Prostate cancer   . Depression   . Bipolar 1 disorder   . Acid reflux disease with ulcer    Past Surgical History  Procedure Laterality Date  . Prostate surgery     No family history on file. History  Substance Use Topics  . Smoking status: Former Games developer  . Smokeless tobacco: Not on file  . Alcohol Use: No    Review of Systems  Constitutional: Positive for chills. Negative for fever.  HENT: Negative for tinnitus.   Respiratory: Negative.   Cardiovascular: Negative.   Gastrointestinal: Positive for vomiting.  Neurological: Positive for weakness. Negative for dizziness, facial asymmetry, speech difficulty, numbness and headaches.  All other systems reviewed and are negative.    Allergies  Penicillins  Home Medications   Current Outpatient Rx  Name  Route  Sig  Dispense  Refill  . divalproex (DEPAKOTE ER) 250 MG 24 hr tablet   Oral   Take 1,750 mg by mouth every evening.         Marland Kitchen FLUoxetine (PROZAC) 20 MG capsule   Oral   Take 60 mg by mouth daily.         Marland Kitchen  glucosamine-chondroitin 500-400 MG tablet   Oral   Take 1 tablet by mouth 2 (two) times daily.          Marland Kitchen HYDROcodone-acetaminophen (NORCO/VICODIN) 5-325 MG per tablet   Oral   Take 2 tablets by mouth every 8 (eight) hours as needed for pain.         Marland Kitchen lisinopril-hydrochlorothiazide (PRINZIDE,ZESTORETIC) 20-12.5 MG per tablet   Oral   Take 1 tablet by mouth daily.         . Magnesium Hydroxide (MAGNESIA PO)   Oral   Take 1 tablet by mouth daily as needed (for headache).         . mirtazapine (REMERON) 45 MG tablet   Oral   Take 45 mg by mouth at bedtime.         . niacin  (SLO-NIACIN) 500 MG tablet   Oral   Take 1,000 mg by mouth at bedtime.         Marland Kitchen omeprazole (PRILOSEC) 20 MG capsule   Oral   Take 40 mg by mouth daily.         . salsalate (DISALCID) 750 MG tablet   Oral   Take 1,500 mg by mouth 2 (two) times daily.         . simvastatin (ZOCOR) 80 MG tablet   Oral   Take 40 mg by mouth at bedtime.           BP 123/79  Pulse 89  Temp(Src) 98.1 F (36.7 C) (Oral)  Resp 16  SpO2 95% Physical Exam  Constitutional: He is oriented to person, place, and time. He appears well-developed and well-nourished.  HENT:  Head: Normocephalic and atraumatic.  Eyes: EOM are normal. Pupils are equal, round, and reactive to light.  Cardiovascular: Normal rate, regular rhythm and normal heart sounds.   Pulmonary/Chest: Effort normal and breath sounds normal.  Abdominal: Soft. Bowel sounds are normal.  Musculoskeletal:  Tenderness to palpation over the right scapula and right back, no mass noted  Neurological: He is alert and oriented to person, place, and time. He has normal reflexes. No cranial nerve deficit.  Mild dysmetria with finger-nose-finger on the right  Skin: Skin is warm and dry.  Psychiatric: He has a normal mood and affect.    ED Course   ED EKG   (only if pt is 66 y.o. or older and pain is above umbilicus)  Date/Time: 02/12/2013 10:26 AM Performed by: Ross Marcus, F Authorized by: Shon Baton Interpreted by ED physician Previous ECG: no previous ECG available Rhythm: sinus rhythm Rate: normal BPM: 92 QRS axis: normal ST Segments: ST segments normal T Waves: T waves normal    (including critical care time)  Labs Reviewed  COMPREHENSIVE METABOLIC PANEL - Abnormal; Notable for the following:    Glucose, Bld 120 (*)    BUN 26 (*)    Albumin 3.1 (*)    Total Bilirubin 0.2 (*)    GFR calc non Af Amer 65 (*)    GFR calc Af Amer 75 (*)    All other components within normal limits  CBC WITH DIFFERENTIAL -  Abnormal; Notable for the following:    WBC 12.8 (*)    RBC 3.92 (*)    Hemoglobin 12.6 (*)    HCT 36.8 (*)    Neutro Abs 9.8 (*)    Lymphocytes Relative 11 (*)    Monocytes Absolute 1.5 (*)    All other components within normal limits  URINALYSIS, DIPSTICK  ONLY - Abnormal; Notable for the following:    Hgb urine dipstick TRACE (*)    Ketones, ur 15 (*)    Leukocytes, UA SMALL (*)    All other components within normal limits  CBC WITH DIFFERENTIAL  POCT I-STAT TROPONIN I   Dg Chest 2 View  02/12/2013   *RADIOLOGY REPORT*  Clinical Data: Back pain, nausea and emesis  CHEST - 2 VIEW  Comparison: None.  Findings: Irregular patchy opacity in the right lung apex.  The remainder the lungs are clear.  Cardiac and mediastinal contours are within normal limits.  No acute osseous abnormality.  No pleural effusion or pneumothorax.  Visualized upper abdominal bowel gas pattern is unremarkable.  IMPRESSION:  Patchy opacity in the right upper lobe may reflect pneumonia in the appropriate clinical setting.  An underlying pulmonary nodule is difficult to exclude.  Recommend imaging followup to resolution.   Original Report Authenticated By: Malachy Moan, M.D.   Ct Head Wo Contrast  02/12/2013   *RADIOLOGY REPORT*  Clinical Data: Left-sided weakness, nausea, unsteady gait  CT HEAD WITHOUT CONTRAST  Technique:  Contiguous axial images were obtained from the base of the skull through the vertex without contrast.  Comparison: None.  Findings: Diffuse brain atrophy evident with periventricular chronic microvascular ischemic changes diffusely.  No acute intracranial hemorrhage, definite acute infarction, mass lesion, midline shift, herniation, hydrocephalus, or extra-axial fluid collection.  Cisterns patent.  Remote inferior right cerebellar infarct with encephalomalacia.  Mastoids and sinuses clear.  IMPRESSION: Atrophy and chronic microvascular ischemic changes.  Remote right inferior cerebellar infarct.  No  acute finding by noncontrast CT.   Original Report Authenticated By: Judie Petit. Miles Costain, M.D.   No diagnosis found.  MDM  This is a 67 year old woman who presents with nausea, vomiting, back pain, and recurrent falls. He is nontoxic-appearing on exam his vital signs are notable for a pulse ox of 94%. Patient physical exam is notable for mild dysmetria on the right and tenderness to palpation in the right scapula. Otherwise exam is benign. The patient presents with multiple complaints. EKG shows no evidence of ST elevation. Given that his symptoms have been present for days, 1 troponin was sent and is negative. Chest x-ray is concerning for right upper lobe opacity which may represent pneumonia. The patient has had a dry cough and temperature to 99.9. He has an allergy to penicillins and was given Levaquin. Regarding the patient's recurrent falls and staring spells, this could be neurologic in nature however my suspicion is that the dysmetria is likely residual from his old stroke and his recurrent falls may be exacerbated by dehydration and pneumonia. A CT scan of the head shows no acute intracranial abnormality. I will defer an MRI to the inpatient team. The patient will be admitted to the inpatient service for rehydration, pneumonia management, serial troponins, and possible further neurologic evaluation.  Shon Baton, MD 02/12/13 1410

## 2013-02-13 DIAGNOSIS — M7918 Myalgia, other site: Secondary | ICD-10-CM | POA: Diagnosis present

## 2013-02-13 DIAGNOSIS — F319 Bipolar disorder, unspecified: Secondary | ICD-10-CM | POA: Diagnosis present

## 2013-02-13 DIAGNOSIS — J189 Pneumonia, unspecified organism: Secondary | ICD-10-CM | POA: Diagnosis present

## 2013-02-13 DIAGNOSIS — Z8546 Personal history of malignant neoplasm of prostate: Secondary | ICD-10-CM

## 2013-02-13 DIAGNOSIS — K219 Gastro-esophageal reflux disease without esophagitis: Secondary | ICD-10-CM | POA: Diagnosis present

## 2013-02-13 DIAGNOSIS — Z8673 Personal history of transient ischemic attack (TIA), and cerebral infarction without residual deficits: Secondary | ICD-10-CM

## 2013-02-13 DIAGNOSIS — R2689 Other abnormalities of gait and mobility: Secondary | ICD-10-CM | POA: Diagnosis present

## 2013-02-13 DIAGNOSIS — R112 Nausea with vomiting, unspecified: Secondary | ICD-10-CM | POA: Diagnosis present

## 2013-02-13 DIAGNOSIS — F329 Major depressive disorder, single episode, unspecified: Secondary | ICD-10-CM | POA: Diagnosis present

## 2013-02-13 DIAGNOSIS — I10 Essential (primary) hypertension: Secondary | ICD-10-CM | POA: Diagnosis present

## 2013-02-13 LAB — COMPREHENSIVE METABOLIC PANEL
AST: 13 U/L (ref 0–37)
CO2: 23 mEq/L (ref 19–32)
Calcium: 9.4 mg/dL (ref 8.4–10.5)
Chloride: 103 mEq/L (ref 96–112)
Creatinine, Ser: 1.18 mg/dL (ref 0.50–1.35)
GFR calc Af Amer: 73 mL/min — ABNORMAL LOW (ref >90)
GFR calc non Af Amer: 63 mL/min — ABNORMAL LOW (ref >90)
Glucose, Bld: 92 mg/dL (ref 70–99)
Total Bilirubin: 0.2 mg/dL — ABNORMAL LOW (ref 0.3–1.2)

## 2013-02-13 LAB — CBC
HCT: 34.5 % — ABNORMAL LOW (ref 39.0–52.0)
Hemoglobin: 11.8 g/dL — ABNORMAL LOW (ref 13.0–17.0)
MCV: 93 fL (ref 78.0–100.0)
RDW: 13.1 % (ref 11.5–15.5)

## 2013-02-13 LAB — TROPONIN I: Troponin I: <0.3 ng/mL (ref <0.30)

## 2013-02-13 MED ORDER — OXYCODONE HCL 5 MG PO TABS
2.5000 mg | ORAL_TABLET | ORAL | Status: DC | PRN
  Administered 2013-02-13 – 2013-02-14 (×2): 2.5 mg via ORAL
  Filled 2013-02-13 (×2): qty 1

## 2013-02-13 MED ORDER — LIDOCAINE 5 % EX PTCH
1.0000 | MEDICATED_PATCH | CUTANEOUS | Status: DC
  Administered 2013-02-13 – 2013-02-14 (×2): 1 via TRANSDERMAL
  Filled 2013-02-13 (×2): qty 1

## 2013-02-13 MED ORDER — ACETAMINOPHEN 325 MG PO TABS
650.0000 mg | ORAL_TABLET | Freq: Four times a day (QID) | ORAL | Status: DC
  Administered 2013-02-13 – 2013-02-14 (×5): 650 mg via ORAL
  Filled 2013-02-13 (×5): qty 2

## 2013-02-13 NOTE — H&P (Signed)
I discussed with  Dr Street.  I agree with their plans documented in their progress note for today.  

## 2013-02-13 NOTE — Progress Notes (Signed)
Family Medicine Teaching Service Daily Progress Note Intern Pager: [phone]  Patient name: Jason [name] Medical record number: [id number] Date of birth: 13-Dec-1945 Age: 67 y.o. Gender: male  Primary Care Provider: No primary provider on file. Consultants: neuro  Code Status: full code  Pt Overview and Major Events to Date: no new events or complaints.   Assessment and Plan:   Jason [name] is a 67 y.o. year old male with history of balance issues secondary to a cerebellar stroke who presented with nausea, cough, right sided back pain and found to have community acquired pneumonia and worsening of his baseline balance issues. PMH is significant for a right cerebellar stroke in 2005 with subsequent chronic balance issues with a recent increase in falls without known trauma.   1. Right Upper lobe pneumonia- Continue Levaquin 750 mg PO. Day 2/7 of antibiotics. May be cause of back pain.   2. Back pain, NOS- monitor symptoms, start treatment with oxycodone IR 2.5 mg q 4hr PRN for severe pain. Also start treatment with lidoderm patch 5% q12 hrs for 24 hrs.   3. Ataxia, improving- frequent neuro checks and ambulation with assistance. Seems to be improving with treatment of pneumonia-think pneumonia may have exacerbated issues by increasing weakness.  4. Nausea/Emesis- continue to treat with zofran 4 mg PO q6h PRN   FEN/GI: IV NS @100ml /hr. Regular diet. Protonix 80 mg PO daily  PPx: Heparin SQ  Disposition: home when medically cleared  Subjective: Jason [name] is doing well this morning. He admits to not sleeping very well, but has not vomited, nausea has improved slightly, and his dizziness/ataxia has reportedly improved as well. He was a little light headed when he stood to go to the bathroom but it soon passed and was able to walk with assistance. He his suppressing his cough because his back hurts so much, the pain wrapping around his ribs on the right when he takes a deep breath or  sneezes or coughs. He denies any headaches, chest pain, SOB, abdominal pain, N/V/D/C, although his one stool this morning was slightly loose. He did not notice any blood in his stool. Denies any numbness burning or tingling in his extremities.      Objective: Temp:  [98.2 F (36.8 C)-100.2 F (37.9 C)] 98.4 F (36.9 C) (08/12 1439) Pulse Rate:  [87-116] 98 (08/12 1439) Resp:  [18-20] 20 (08/12 1439) BP: (93-134)/(53-79) 134/79 mmHg (08/12 1439) SpO2:  [93 %-97 %] 97 % (08/12 1439) Weight:  [179 lb 6.4 oz (81.375 kg)] 179 lb 6.4 oz (81.375 kg) (08/11 1610) Physical Exam: General: NAD HEENT: [place]/AT, PERRLA, EOMI Cardiovascular: RRR, no m/r/g  Respiratory: course breath sounds on right, but improved from yesterday...no wheezes or ronchi.  Abdomen: NBSx4Q, Soft/NT/ND  Extremities: no cyanosis, clubbing, or edema, pulses 2+ upper and lower extremities bilaterally Neuro: CN2-12 intact, strength 5/5 upper and lower extremities b/l. Sensation intact upper and lower extremities b/l.  MSK: right lateral back mid thoracic region tender to palpation.   Laboratory:  Recent Labs Lab 02/12/13 1130 02/12/13 1831 02/13/13 0505  WBC 12.8* 11.9* 12.1*  HGB 12.6* 11.8* 11.8*  HCT 36.8* 36.7* 34.5*  PLT 197 199 194    Recent Labs Lab 02/12/13 1049 02/12/13 1831 02/13/13 0505  NA 139  --  141  K 3.6  --  3.5  CL 100  --  103  CO2 25  --  23  BUN 26*  --  20  CREATININE 1.15 1.06 1.18  CALCIUM 9.6  --  9.4  PROT 7.2  --  6.9  BILITOT 0.2*  --  0.2*  ALKPHOS 88  --  84  ALT 11  --  9  AST 14  --  13  GLUCOSE 120*  --  92     Imaging/Diagnostic Tests:  8.11.14 CT head w/o contrast  Findings: Diffuse brain atrophy evident with periventricular  chronic microvascular ischemic changes diffusely. No acute  intracranial hemorrhage, definite acute infarction, mass lesion,  midline shift, herniation, hydrocephalus, or extra-axial fluid  collection. Cisterns patent. Remote inferior right  cerebellar  infarct with encephalomalacia. Mastoids and sinuses clear.  IMPRESSION:  Atrophy and chronic microvascular ischemic changes.  Remote right inferior cerebellar infarct.  No acute finding by noncontrast CT.  Original Report Authenticated By: Judie Petit. Miles Costain, M.D.   Gretta Began, Med Student 02/13/2013, 4:01 PM MS-4, [facility] FPTS Intern pager: [phone], text pages welcome  Family Medicine Upper Level Addendum:   I have seen and examined the patient independently, discussed with student Doctor Judkings, fully reviewed the progress note and agree with it's contents with the additions as noted in blue text. My independent exam is below.   S: Feels much better overall including dizziness.   O: BP 134/79  Pulse 98  Temp(Src) 98.4 F (36.9 C) (Oral)  Resp 20  Ht 5\' 4"  (1.626 m)  Wt 179 lb 6.4 oz (81.375 kg)  BMI 30.78 kg/m2  SpO2 97% Gen: NAD, resting comfortably in bed HEENT: Mucous membranes are moist. CV: RRR no mrg  Lungs: slightly course breath sounds at right base otherwise CTAB Abd: soft/nontender/nondistended/normal bowel sounds  MSK: moves all extremities, no edema  Skin: warm and dry, no rash  Neuro: CN II-XII intact, sensation and reflexes normal throughout, 5/5 muscle strength in bilateral upper and lower extremities. Slight dysmetria noted. Did not have patient walk.    A/P:   67 year old male with community acquired pneumonia likely worsening baseline dizziness from prior CVA.  -Levaquin day 2/7 -Back pain ? Related to pneumonia-pain control as above -dizziness/ataxia improving with PNA treatment and hydration -continue zofran and hydration for nausea/emesis. Improving overall.   Bipolar-continue home meds  (depakote, fluoxetine) GERD-continue PPI HTN-continue home meds including lisinopril, HCTZ(doubt CVA so not requiring permissive HTN). Continue ASA.  HLD-continue lipitor  Tana Conch, MD, PGY3 02/13/2013 4:53 PM

## 2013-02-13 NOTE — Evaluation (Signed)
Physical Therapy Evaluation Patient Details Name: Jason [name] MRN: [id number] DOB: Nov 29, 1945 Today's Date: 02/13/2013 Time: 4098-1191 PT Time Calculation (min): 28 min  PT Assessment / Plan / Recommendation History of Present Illness  Pt is a 67 y/o male admitted for HCAP.  Clinical Impression  Pt is a very pleasant 67 y/o gentleman who reports back pain at beginning of session, which was intensified with coughing/sneezing. Pt reports feeling better after sitting in recliner, and does not have complaints of pain at end of session. Pt did well negotiating in bathroom with RW, and pt overall demonstrates ability to perform functional mobility with supervision/modified independence. Some balance deficits noted during ambulation but pt did not require any assist to recover.    PT Assessment  Patient needs continued PT services    Follow Up Recommendations  Home health PT    Does the patient have the potential to tolerate intense rehabilitation      Barriers to Discharge        Equipment Recommendations  Rolling walker with 5" wheels    Recommendations for Other Services     Frequency Min 3X/week    Precautions / Restrictions Precautions Precautions: Fall Restrictions Weight Bearing Restrictions: No   Pertinent Vitals/Pain Back pain rated 9/10 at beginning of session. Reports he had already received pain medicine, and did not want to call the nurse again. At end of session pt sitting in recliner and reports no back pain.      Mobility  Bed Mobility Bed Mobility: Supine to Sit;Sitting - Scoot to Edge of Bed Supine to Sit: 6: Modified independent (Device/Increase time);HOB elevated;With rails Sitting - Scoot to Edge of Bed: 6: Modified independent (Device/Increase time) Details for Bed Mobility Assistance: Pt demonstrated proper hand placement and safety awareness Transfers Transfers: Sit to Stand;Stand to Sit Sit to Stand: 5: Supervision;From bed;With upper extremity  assist Stand to Sit: 5: Supervision;To toilet;To chair/3-in-1;With upper extremity assist Ambulation/Gait Ambulation/Gait Assistance: 4: Min guard Ambulation Distance (Feet): 175 Feet Assistive device: Rolling walker Ambulation/Gait Assistance Details: Pt ambulated with decreased cadence and did not require any assist to maintain balance with the walker. Gait Pattern: Step-through pattern;Decreased stride length Gait velocity: Decreased    Exercises     PT Diagnosis: Difficulty walking  PT Problem List: Decreased strength;Decreased activity tolerance;Decreased balance;Decreased mobility;Decreased knowledge of use of DME;Decreased safety awareness PT Treatment Interventions: DME instruction;Gait training;Stair training;Functional mobility training;Therapeutic activities;Therapeutic exercise;Neuromuscular re-education;Patient/family education     PT Goals(Current goals can be found in the care plan section) Acute Rehab PT Goals Patient Stated Goal: To return home PT Goal Formulation: With patient Time For Goal Achievement: 02/20/13 Potential to Achieve Goals: Good  Visit Information  Last PT Received On: 02/13/13 Assistance Needed: +1 History of Present Illness: Pt is a 67 y/o male admitted for HCAP.       Prior Functioning  Home Living Family/patient expects to be discharged to:: Private residence Living Arrangements: Spouse/significant other Available Help at Discharge: Family Type of Home: House Home Access: Stairs to enter Secretary/administrator of Steps: 2 Home Layout: Two level Alternate Level Stairs-Number of Steps: 18 Alternate Level Stairs-Rails: Left Home Equipment: Cane - single point;Grab bars - tub/shower Prior Function Level of Independence: Independent with assistive device(s) Communication Communication: No difficulties Dominant Hand: Right    Cognition  Cognition Arousal/Alertness: Awake/alert Behavior During Therapy: WFL for tasks  assessed/performed Overall Cognitive Status: Within Functional Limits for tasks assessed    Extremity/Trunk Assessment Upper Extremity Assessment Upper Extremity Assessment: Defer  to OT evaluation Lower Extremity Assessment Lower Extremity Assessment: Overall WFL for tasks assessed Cervical / Trunk Assessment Cervical / Trunk Assessment: Kyphotic   Balance Balance Balance Assessed: Yes Static Sitting Balance Static Sitting - Balance Support: No upper extremity supported Static Sitting - Level of Assistance: 6: Modified independent (Device/Increase time) Static Sitting - Comment/# of Minutes: 5 Static Standing Balance Static Standing - Balance Support: Bilateral upper extremity supported Static Standing - Level of Assistance: 5: Stand by assistance Static Standing - Comment/# of Minutes: 1  End of Session PT - End of Session Equipment Utilized During Treatment: Gait belt Activity Tolerance: Patient tolerated treatment well Patient left: in chair;with call bell/phone within reach Nurse Communication: Mobility status  GP     Ruthann Cancer 02/13/2013, 1:37 PM  Ruthann Cancer, PT Acute Rehabilitation Services

## 2013-02-13 NOTE — Progress Notes (Signed)
I have seen and examined this patient. I have discussed with Dr Durene Cal and MS IV Judkins .  I agree with their findings and plans as documented in their Progress notes.

## 2013-02-13 NOTE — Evaluation (Signed)
Clinical/Bedside Swallow Evaluation Patient Details  Name: Jason [name] MRN: [id number] Date of Birth: [birth year]  Today's Date: 02/13/2013 Time: 0865-7846 SLP Time Calculation (min): 12 min  Past Medical History:  Past Medical History  Diagnosis Date  . Hypertension   . Stroke   . Prostate cancer   . Depression   . Bipolar 1 disorder   . Acid reflux disease with ulcer    Past Surgical History:  Past Surgical History  Procedure Laterality Date  . Prostate surgery     HPI:  67 y.o. 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 PMH is significant for a right cerebellar stroke in 2005 with subsequent chronic balance issues with a recent increase in falls without known trauma. Also he has been previously diagnosed with and treated for bipolar disorder, HTN, GERD/PUD, prostate cancer, as well as a heart catheterization    Assessment / Plan / Recommendation Clinical Impression  Pt presents with functional oropharyngeal swallow with adequate mastication, swift swallow trigger, and occasional throat-clearing during PO consumption, but overall appears to have adequate airway protection.  Recommend resuming PO diet - regular, thin liquids, meds whole with water.  SLP to sign off.     Aspiration Risk  Mild    Diet Recommendation Regular;Thin liquid   Liquid Administration via: Cup;Straw Medication Administration: Whole meds with liquid Supervision: Patient able to self feed    Other  Recommendations Oral Care Recommendations: Oral care BID   Follow Up Recommendations  None           SLP Swallow Goals     Swallow Study Prior Functional Status       General Date of Onset: 02/12/13 HPI: 67 y.o. year old male presenting with nausea and vomiting, cough, right sided back pain and chronic balance issues secondary to a cerebellar stroke that has recently  been worsening. PMH is significant for a right cerebellar stroke in 2005 with subsequent chronic balance issues with a recent increase in falls without known trauma. Also he has been previously diagnosed with and treated for bipolar disorder, HTN, GERD/PUD, prostate cancer, as well as a heart catheterization  Type of Study: Bedside swallow evaluation Previous Swallow Assessment: none per records Diet Prior to this Study: NPO Temperature Spikes Noted: No Respiratory Status: Room air Behavior/Cognition: Alert;Cooperative Oral Cavity - Dentition: Adequate natural dentition Self-Feeding Abilities: Able to feed self (tremor) Patient Positioning: Upright in bed Baseline Vocal Quality: Clear Volitional Cough: Strong Volitional Swallow: Able to elicit    Oral/Motor/Sensory Function Overall Oral Motor/Sensory Function: Appears within functional limits for tasks assessed   Ice Chips Ice chips: Within functional limits Presentation: Self Fed;Spoon   Thin Liquid Thin Liquid: Within functional limits Presentation: Cup;Straw;Self Fed    Nectar Thick Nectar Thick Liquid: Not tested   Honey Thick Honey Thick Liquid: Not tested   Puree Puree: Within functional limits   Solid   GO    Solid: Within functional limits Presentation: Self Fed       Jason [name] Jason [name] 02/13/2013,9:08 AM

## 2013-02-14 DIAGNOSIS — R29818 Other symptoms and signs involving the nervous system: Secondary | ICD-10-CM

## 2013-02-14 LAB — CBC
HCT: 33 % — ABNORMAL LOW (ref 39.0–52.0)
Hemoglobin: 11.1 g/dL — ABNORMAL LOW (ref 13.0–17.0)
MCH: 31.2 pg (ref 26.0–34.0)
MCHC: 33.6 g/dL (ref 30.0–36.0)
MCV: 92.7 fL (ref 78.0–100.0)

## 2013-02-14 MED ORDER — LEVOFLOXACIN 750 MG PO TABS
750.0000 mg | ORAL_TABLET | Freq: Every day | ORAL | Status: DC
  Administered 2013-02-14: 750 mg via ORAL
  Filled 2013-02-14: qty 1

## 2013-02-14 MED ORDER — ONDANSETRON HCL 4 MG PO TABS: 4.0000 mg | ORAL_TABLET | Freq: Four times a day (QID) | ORAL | Status: DC | PRN

## 2013-02-14 MED ORDER — OXYCODONE HCL 5 MG PO TABS: 2.5000 mg | ORAL_TABLET | ORAL | Status: DC | PRN

## 2013-02-14 MED ORDER — LIDOCAINE 5 % EX PTCH: 1.0000 | MEDICATED_PATCH | CUTANEOUS | Status: DC

## 2013-02-14 MED ORDER — LEVOFLOXACIN 750 MG PO TABS: 750.0000 mg | ORAL_TABLET | Freq: Every day | ORAL | Status: DC

## 2013-02-14 MED ORDER — ASPIRIN 81 MG PO TBEC: 81.0000 mg | DELAYED_RELEASE_TABLET | Freq: Every day | ORAL | Status: DC

## 2013-02-14 NOTE — Progress Notes (Signed)
Family Medicine Teaching Service Daily Progress Note Intern Pager: [phone]  Patient name: Jason [name] Medical record number: [id number] Date of birth: July 24, 1945 Age: 67 y.o. Gender: male  Primary Care Provider: No primary provider on file. Consultants: none Code Status: Full Code  Pt Overview and Major Events to Date: No new events or complaints. Leukocytosis increased over night despite abx, but patients symptoms are greatly improving.   Assessment and Plan:  Jason [name] is a 67 year old male who presented with worsening nausea and emesis along with a worsening of a chronic balance issue 2/2 and old cerebellar stroke. Found to have a pneumonia in right upper lobe on admission, as well as severe right mid thoracic back pain that was worse with inspiration.   1. RUL Pneumonia, improving: Continue Levaquin 750 mg, but convert to PO at same dose. Day 3/7 treatment. Cough has decreased greatly and denies fevers or chills. May be cause of back pain, which has also been improving with oxycodone and lidocaine patch.   2. Back pain, NOS, improving: Responded well to lidoderm patch. Continue treatment with patch on discharge. Anticipate discharge with with 30 tablets for 5 days of treatment.   3. Ataxia, improving- ambulation with assistance. Seems to be improving with treatment of pneumonia; pneumonia may have exacerbated issues by increasing weakness. PT/OT has recommended a wheeled walker for home to assist with ambulation.   4. Nausea/Emesis- continue to treat with zofran 4 mg PO q6h PRN.    FEN/GI: Regular diet. Protonix 80 mg PO daily  PPx: Heparin SQ   Disposition: home with assistance in ambulation (walker) into care of wife  Subjective: Jason [name] is doing much better this morning. His back pain is greatly improved with the lidoderm patch and oxycodone 2.5 mg q 4hr prn. His cough has improved greatly, too, and he feels as though his ataxia is better, only needing moderate  assistance to ambulate. He no longer is experiencing nausea or vomiting. He denies headache, dizziness, chest pain, sob, nausea, vomiting.   Objective: Temp:  [98.4 F (36.9 C)-101.1 F (38.4 C)] 98.9 F (37.2 C) (08/13 0542) Pulse Rate:  [95-98] 95 (08/13 0542) Resp:  [18-20] 18 (08/13 0542) BP: (113-151)/(45-89) 149/89 mmHg (08/13 1213) SpO2:  [94 %-98 %] 94 % (08/13 0542) Physical Exam: General: NAD, AAOx3 Cardiovascular: RRR, no m/r/g Respiratory: CTAB, maybe slight crackles in right lower lobe Abdomen: nbs, nt/nd, soft Extremities: no cyanosis, clubbing, edema, pulses 2+/4 upper and lower extremities, b/l  Laboratory:  Recent Labs Lab 02/12/13 1831 02/13/13 0505 02/14/13 0530  WBC 11.9* 12.1* 13.5*  HGB 11.8* 11.8* 11.1*  HCT 36.7* 34.5* 33.0*  PLT 199 194 203    Recent Labs Lab 02/12/13 1049 02/12/13 1831 02/13/13 0505  NA 139  --  141  K 3.6  --  3.5  CL 100  --  103  CO2 25  --  23  BUN 26*  --  20  CREATININE 1.15 1.06 1.18  CALCIUM 9.6  --  9.4  PROT 7.2  --  6.9  BILITOT 0.2*  --  0.2*  ALKPHOS 88  --  84  ALT 11  --  9  AST 14  --  13  GLUCOSE 120*  --  92    Imaging/Diagnostic Tests: None new in the last 48 hours.  Gretta Began, Med Student 02/14/2013, 2:14 PM MS4, [place] [facility] FPTS Intern pager: [phone], text pages welcome  FPTS Upper Level Addendum  S: As above.  Pt feels well, ambulated in hallway with PT and states he feels much closer to normal. His breathing is unlabored and his cough is improved. He states his back pain is much better with Lidoderm and PO meds. Denies fever/chills, abdominal pain.  O: I have reviewed the patient's current medications, labs/imaging, and diagnostic tests. BP 149/89  Pulse 95  Temp(Src) 98.9 F (37.2 C) (Oral)  Resp 18  Ht 5\' 4"  (1.626 m)  Wt 179 lb 6.4 oz (81.375 kg)  BMI 30.78 kg/m2  SpO2 94% General: elderly male eating breakfast in NAD, AAOx3 Cardiovascular: RRR, no murmur  appreciated Respiratory: CTAB, very soft crackles in the right base, much improved from previous days Abdomen: soft, nontender Extremities: no LE edema, distal pulses intact Neuro: strength 5/5 in both upper and lower extremities, no gross focal deficit  Did not stand/ambulate pt but he reports his unsteady balance is "no worse than it was before he got sick"  A/P: 67yo male with 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, improving with treatment of pneumonia # HTN - continue home HCTZ, lisinopril # GERD, N/V - continue salsalate, PPI # Bipolar disorder - continue home Depakote, Prozac, Remeron # HLD - continue home statin at discharge # N/V - seems resolved, continue Zofran PRN  #FEN/GI: saline lock IV #Prophylaxis: subQ heparin  Dispo: discharge home today with home health PT; will give pt information f/u / establish with new PCP of his choice  I have independently interviewed and examined this patient in conjunction with Jason [name], whose note is documented above with any additions/clarifications/edits in orange. The above addendum reflects my independent interview, exam, and assessment/plan. Please see also attending notes.   Bobbye Morton, MD PGY-2, [facility] 02/14/2013, 3:21 PM FPTS Service pager: [phone] (text pages welcome through [facility])

## 2013-02-14 NOTE — Progress Notes (Signed)
Physical Therapy Treatment Patient Details Name: Jason [name] MRN: [id number] DOB: 04-May-1946 Today's Date: 02/14/2013 Time: 4098-1191 PT Time Calculation (min): 30 min  PT Assessment / Plan / Recommendation  History of Present Illness Pt is a 67 y/o male admitted for HCAP.   PT Comments   Pt is progressing well towards physical therapy goals. Pt was able to improve ambulation distance with RW, and required only supervision throughout. Pt required cueing for improved posture and looking up throughout the session, however overall demonstrated good technique for functional mobility. D/C to home with HHPT to follow remains appropriate.  Follow Up Recommendations  Home health PT     Does the patient have the potential to tolerate intense rehabilitation     Barriers to Discharge        Equipment Recommendations  Rolling walker with 5" wheels    Recommendations for Other Services    Frequency Min 3X/week   Progress towards PT Goals Progress towards PT goals: Progressing toward goals  Plan Current plan remains appropriate    Precautions / Restrictions Precautions Precautions: Fall Restrictions Weight Bearing Restrictions: No   Pertinent Vitals/Pain Pt reports low back pain throughout session that he states feels better when he is up and walking. Pt has heating pad present in bed for LB pain and therapist set up at end of session.   Mobility  Bed Mobility Bed Mobility: Supine to Sit;Sitting - Scoot to [place] of Bed;Sit to Supine;Scooting to [facility] Supine to Sit: 6: Modified independent (Device/Increase time);HOB elevated;With rails Sitting - Scoot to Edge of Bed: 6: Modified independent (Device/Increase time);With rail Sit to Supine: 6: Modified independent (Device/Increase time);HOB flat;With rail Scooting to [facility]: 6: Modified independent (Device/Increase time);With rail Details for Bed Mobility Assistance: Pt demonstrated proper hand placement and safety  awareness Transfers Transfers: Sit to Stand;Stand to Sit;Lateral/Scoot Transfers Sit to Stand: 5: Supervision;From bed;With upper extremity assist Stand to Sit: 5: Supervision;To bed;With upper extremity assist Lateral/Scoot Transfers: 6: Modified independent (Device/Increase time) Details for Transfer Assistance: Pt demonstrated proper hand placement and safety awareness however seemed slightly unsteady with sit>stand. Pt also somewhat impulsive with transfers before walker is available. Ambulation/Gait Ambulation/Gait Assistance: 5: Supervision Ambulation Distance (Feet): 250 Feet Assistive device: Rolling walker Ambulation/Gait Assistance Details: Pt able to ambulate in hallway and to/from bathroom with supervision and assist for IV pole negotiation. Pt otherwise steady and safe with the walker. Gait Pattern: Step-through pattern;Decreased stride length;Trunk flexed Gait velocity: Decreased Stairs: No    Exercises General Exercises - Lower Extremity Long Arc Quad: 10 reps;Both Hip ABduction/ADduction: 10 reps;Both;Strengthening (with manual resistance each way) Hip Flexion/Marching: 10 reps;Both   PT Diagnosis:    PT Problem List:   PT Treatment Interventions:     PT Goals (current goals can now be found in the care plan section) Acute Rehab PT Goals Patient Stated Goal: To return home PT Goal Formulation: With patient Time For Goal Achievement: 02/20/13 Potential to Achieve Goals: Good  Visit Information  Last PT Received On: 02/14/13 Assistance Needed: +1 History of Present Illness: Pt is a 67 y/o male admitted for HCAP.    Subjective Data  Subjective: "I feel much better than yesterday, and I have a heat pack on my back." Patient Stated Goal: To return home   Cognition  Cognition Arousal/Alertness: Awake/alert Behavior During Therapy: WFL for tasks assessed/performed Overall Cognitive Status: Within Functional Limits for tasks assessed    Balance   Balance Balance Assessed: Yes Static Sitting Balance Static Sitting - Balance  Support: No upper extremity supported Static Sitting - Level of Assistance: 7: Independent Static Sitting - Comment/# of Minutes: 5 (while pt got shorts on and therapist got equipment ready) Static Standing Balance Static Standing - Balance Support: Bilateral upper extremity supported Static Standing - Level of Assistance: 6: Modified independent (Device/Increase time) Static Standing - Comment/# of Minutes: 2  End of Session PT - End of Session Equipment Utilized During Treatment: Gait belt Activity Tolerance: Patient tolerated treatment well Patient left: in bed;with call bell/phone within reach   GP     Ruthann Cancer 02/14/2013, 11:40 AM  Ruthann Cancer, PT Acute Rehabilitation Services

## 2013-02-14 NOTE — Evaluation (Signed)
Occupational Therapy Evaluation Patient Details Name: Jason [name] MRN: [id number] DOB: 08-06-1945 Today's Date: 02/14/2013 Time: 7829-5621 OT Time Calculation (min): 27 min  OT Assessment / Plan / Recommendation History of present illness Pt is a 67 y/o male admitted for HCAP.  H/o of old cerebellar stroke.   Clinical Impression   Pt admitted with above. Pt overall supervision/setup assist with ADLs. Pt reports he will have 24/7 supervision/assist at home from family.  Feel that pt does not need further acute OT as education needs have been met. Will sign off.    OT Assessment  Patient does not need any further OT services    Follow Up Recommendations  No OT follow up;Supervision/Assistance - 24 hour    Barriers to Discharge      Equipment Recommendations  None recommended by OT (pt to purchase shower chair in community)    Recommendations for Other Services    Frequency       Precautions / Restrictions Precautions Precautions: Fall   Pertinent Vitals/Pain See vitals    ADL  Lower Body Bathing: Performed;Set up Where Assessed - Lower Body Bathing: Unsupported sit to stand Upper Body Dressing: Performed;Set up Where Assessed - Upper Body Dressing: Unsupported sitting Lower Body Dressing: Performed;Set up Where Assessed - Lower Body Dressing: Unsupported sit to stand Toilet Transfer: Simulated;Supervision/safety Toilet Transfer Method: Sit to Barista:  (bed) Transfers/Ambulation Related to ADLs: supervision with RW ADL Comments: Pt with urgent need to use urinal on OT arrival.  Pt with someu urinary incontinence before able to reach for urinal. Pt reports he feels this is due to all the fluids he is getting and does not usually have episodes of incontinence.  Pt states he has had several falls in the shower over past few years, primarily due to reaching down for soap. Educated pt on use of shower chair vs bench and how to attain tub DME.  Pt  verbalized understanding and states he plans to have his wife or daughter purchase a shower chair in the community.    OT Diagnosis:    OT Problem List:   OT Treatment Interventions:     OT Goals(Current goals can be found in the care plan section) Acute Rehab OT Goals Patient Stated Goal: To return home  Visit Information  Last OT Received On: 02/14/13 Assistance Needed: +1 History of Present Illness: Pt is a 67 y/o male admitted for HCAP.       Prior Functioning     Home Living Family/patient expects to be discharged to:: Private residence Living Arrangements: Spouse/significant other Available Help at Discharge: Family;Available 24 hours/day Type of Home: House Home Access: Stairs to enter [employer] of Steps: 2 Home Layout: Two level Alternate Level Stairs-Number of Steps: 18 Alternate Level Stairs-Rails: Left Home Equipment: Cane - single point;Grab bars - tub/shower Prior Function Level of Independence: Independent with assistive device(s) Communication Communication: No difficulties Dominant Hand: Right         Vision/Perception     Cognition  Cognition Arousal/Alertness: Awake/alert Behavior During Therapy: WFL for tasks assessed/performed Overall Cognitive Status: Within Functional Limits for tasks assessed    Extremity/Trunk Assessment Upper Extremity Assessment Upper Extremity Assessment: Overall WFL for tasks assessed     Mobility Bed Mobility Bed Mobility: Supine to Sit;Sitting - Scoot to Edge of Bed;Sit to Supine Supine to Sit: 6: Modified independent (Device/Increase time) Sitting - Scoot to Edge of Bed: 6: Modified independent (Device/Increase time) Sit to Supine: 6: Modified independent (Device/Increase  time) Transfers Transfers: Stand to Sit;Sit to Stand Sit to Stand: 5: Supervision;From bed Stand to Sit: 5: Supervision;To bed     Exercise     Balance     End of Session OT - End of Session Equipment Utilized During  Treatment: Rolling walker Activity Tolerance: Patient tolerated treatment well Patient left: in bed;with call bell/phone within reach;with nursing/sitter in room Nurse Communication: Mobility status  GO    02/14/2013 Jason [name] OTR/L Pager [phone] Office [phone]  Jason, [name] 02/14/2013, 3:45 PM

## 2013-02-14 NOTE — Progress Notes (Signed)
NURSING PROGRESS NOTE  Quantarius Genrich [id number] Discharge Data: 02/14/2013 6:00 PM Attending Provider: Leighton Roach McDiarmid, MD PCP:No primary provider on file.     Baldo Ash to be D/C'd Home per MD order.  Discussed with the patient the After Visit Summary and all questions fully answered. All IV's discontinued with no bleeding noted. All belongings returned to patient for patient to take home.   Last Vital Signs:  Blood pressure 142/78, pulse 98, temperature 97.8 F (36.6 C), temperature source Oral, resp. rate 18, height 5\' 4"  (1.626 m), weight 81.375 kg (179 lb 6.4 oz), SpO2 98.00%.  Discharge Medication List   Medication List         aspirin 81 MG EC tablet  Take 1 tablet (81 mg total) by mouth daily.     divalproex 250 MG 24 hr tablet  Commonly known as:  DEPAKOTE ER  Take 1,750 mg by mouth every evening.     FLUoxetine 20 MG capsule  Commonly known as:  PROZAC  Take 60 mg by mouth daily.     glucosamine-chondroitin 500-400 MG tablet  Take 1 tablet by mouth 2 (two) times daily.     HYDROcodone-acetaminophen 5-325 MG per tablet  Commonly known as:  NORCO/VICODIN  Take 2 tablets by mouth every 8 (eight) hours as needed for pain.     levofloxacin 750 MG tablet  Commonly known as:  LEVAQUIN  Take 1 tablet (750 mg total) by mouth daily. First dose 8/14. One pill daily at lunchtime until finished.     lidocaine 5 %  Commonly known as:  LIDODERM  Place 1 patch onto the skin daily. Remove patch after 12 hours. DO NOT use more than 1 patch in any given 24-hour period.     lisinopril-hydrochlorothiazide 20-12.5 MG per tablet  Commonly known as:  PRINZIDE,ZESTORETIC  Take 1 tablet by mouth daily.     MAGNESIA PO  Take 1 tablet by mouth daily as needed (for headache).     mirtazapine 45 MG tablet  Commonly known as:  REMERON  Take 45 mg by mouth at bedtime.     niacin 500 MG tablet  Commonly known as:  SLO-NIACIN  Take 1,000 mg by mouth at bedtime.     omeprazole  20 MG capsule  Commonly known as:  PRILOSEC  Take 40 mg by mouth daily.     ondansetron 4 MG tablet  Commonly known as:  ZOFRAN  Take 1 tablet (4 mg total) by mouth every 6 (six) hours as needed for nausea.     oxyCODONE 5 MG immediate release tablet  Commonly known as:  Oxy IR/ROXICODONE  Take 0.5-1 tablets (2.5-5 mg total) by mouth every 4 (four) hours as needed.     salsalate 750 MG tablet  Commonly known as:  DISALCID  Take 1,500 mg by mouth 2 (two) times daily.     simvastatin 80 MG tablet  Commonly known as:  ZOCOR  Take 40 mg by mouth at bedtime.

## 2013-02-14 NOTE — Progress Notes (Signed)
Pt reassessed prior to discharge. Reported by RN to have several/increased number of voids, today. Per pt, he has no dysuria, burning or pain on urination, and no real difficulty with starting his stream, though he has had to stand "to get started." No suprapubic tenderness, flank tenderness, CVA tenderness, fever/chills.   Increased voids can likely be explained by IVF (pt has been on 100 mL/h for at least the past two days, despite good PO intake), and some mild retention could be explained by oxycodone use. Bladder scan performed with 128 mL residual. Considered in-and-out cath but did not feel this was necessary. Do not believe this should hold pt from discharge. Reviewed f/u plans with [facility] primary care office and provided pt with Rx's. Reviewed reasons to return to care promptly (including increased SOB/difficulty breathing, fever, worse symptoms related to balance, etc).  Bobbye Morton, MD PGY-2, [facility] 02/14/2013, 4:13 PM FPTS Service pager: [phone] (text pages welcome through [facility])

## 2013-02-14 NOTE — Discharge Summary (Signed)
Family Medicine Teaching [facility] Discharge Summary  Patient name: Jason [name] Medical record number: [id number] Date of birth: 1945-08-16 Age: 67 y.o. Gender: male Date of Admission: 02/12/2013  Date of Discharge: 8.13.14 Admitting Physician: Leighton Roach McDiarmid, MD  Primary Care Provider: No primary provider on file. (Plan to establish care with [facility]) Consultants: none  Indication for Hospitalization: nausea/emesis, worsening chronic balance issues, back pain, cough  Discharge Diagnoses/Problem List:  1. RUL Pneumonia 2. Musculoskeletal back pain 3. History of cerebellar stroke 4. Ataxia 5. Nausea/Emesis 6. Bipolar disorder 7. HTN 8. HLD 9. GERD  Disposition: home with home health PT  Discharge Condition: stable  Brief Hospital Course: Jason [name] presented with nausea and vomiting and falls due to worsening ataxia which had been present though less severe since his cerebellar stroke several years ago. He also complained on presentation with back pain that worsened greatly with inspiration or coughing, and a non-productive cough and increasing fevers at home. 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 CT scan of head was negative for any acute findings, and chest xray was notable for a right upper lobe consolidation, both done on 8.11.14. He was treated for pneumonia with Levaquin 750 mg IV, now on day 3 of 7, and was given oxycodone 2.5mg  plus a lidoderm 5% patch for back pain. His cough improved and his back pain was greatly improved with Lidoderm and oxycodone. His nausea also resolved over the course of his hospitalization and he did not have any episodes of emesis. His balance improved to where it had been prior to hospitalization according to patient as he was walking to and from the bathroom with a walker without issue; patient was evaluated  by PT, who recommended home health PT and a rolling walker, both of which were arranged at discharge.  Of note, pt had marked improvement with abx therapy, as above, though his WBC was increased at time of discharge; however, as pt had continued to be afebrile without O2 requirement and had clinically improved, this was not felt to be significant enough to hold his discharge.  Chronic issues were controlled with home meds (bipolar disorder, home Depakote, Prozac, Remeron; HTN, home HCTZ and Lisinopril, initially held due to concern for possible new stroke, continued at discharge; HLD, Lipitor substituted for home Zocor continued at discharge; GERD, home salsalate with Protonix substituted for home omeprazole, both home meds continued at discharge). Of note, pt was not on aspirin prior to discharge and was instructed to start 81 mg per day.  Issues for Follow Up:  1. RUL PNA - ensure pt completes course of Levaquin for total 7 days (4 days remaining at time of discharge); please evaluate for any further cough, N/V, fever, etc. Acute pneumonia believed to exacerbate chronic balance issues. Consider repeat CXR in 4-6 weeks and/or repeat CBC to evaluate for elevated WBC.  2. Hx of cerebellar stroke - please evaluate for any worsening neurologic symptoms, worse ataxia, etc. Note pt was not on aspirin previously and was instructed to start 81 mg per day. Pt was continued on BP meds, statin.  3. Chronic issues (HTN, HLD, GERD, bipolar) - Follow up any acute or new issues as needed.   Significant Procedures: none  Significant Labs and Imaging:   Recent Labs Lab 02/12/13 1831 02/13/13 0505 02/14/13 0530  WBC 11.9* 12.1* 13.5*  HGB 11.8* 11.8* 11.1*  HCT 36.7* 34.5* 33.0*  PLT 199 194 203    Recent Labs Lab 02/12/13 1049 02/12/13 1831 02/13/13 0505  NA 139  --  141  K 3.6  --  3.5  CL 100  --  103  CO2 25  --  23  GLUCOSE 120*  --  92  BUN 26*  --  20  CREATININE 1.15 1.06 1.18  CALCIUM  9.6  --  9.4  ALKPHOS 88  --  84  AST 14  --  13  ALT 11  --  9  ALBUMIN 3.1*  --  2.8*   8.11.14 Head CT-scan  IMPRESSION:  Atrophy and chronic microvascular ischemic changes.  Remote right inferior cerebellar infarct.  No acute finding by noncontrast CT.  Original Report Authenticated By: Judie Petit. Shick, M.D.   8.11.14 Chest X-Ray  IMPRESSION:  Patchy opacity in the right upper lobe may reflect pneumonia in the  appropriate clinical setting.  An underlying pulmonary nodule is difficult to exclude. Recommend  imaging followup to resolution.  Original Report Authenticated By: Malachy Moan, M.D.  Outstanding Results: none  Discharge Medications:    Medication List         aspirin 81 MG EC tablet  Take 1 tablet (81 mg total) by mouth daily.     divalproex 250 MG 24 hr tablet  Commonly known as:  DEPAKOTE ER  Take 1,750 mg by mouth every evening.     FLUoxetine 20 MG capsule  Commonly known as:  PROZAC  Take 60 mg by mouth daily.     glucosamine-chondroitin 500-400 MG tablet  Take 1 tablet by mouth 2 (two) times daily.     HYDROcodone-acetaminophen 5-325 MG per tablet  Commonly known as:  NORCO/VICODIN  Take 2 tablets by mouth every 8 (eight) hours as needed for pain.     levofloxacin 750 MG tablet  Commonly known as:  LEVAQUIN  Take 1 tablet (750 mg total) by mouth daily. First dose 8/14. One pill daily at lunchtime until finished.     lidocaine 5 %  Commonly known as:  LIDODERM  Place 1 patch onto the skin daily. Remove patch after 12 hours. DO NOT use more than 1 patch in any given 24-hour period.     lisinopril-hydrochlorothiazide 20-12.5 MG per tablet  Commonly known as:  PRINZIDE,ZESTORETIC  Take 1 tablet by mouth daily.     MAGNESIA PO  Take 1 tablet by mouth daily as needed (for headache).     mirtazapine 45 MG tablet  Commonly known as:  REMERON  Take 45 mg by mouth at bedtime.     niacin 500 MG tablet  Commonly known as:  SLO-NIACIN  Take 1,000  mg by mouth at bedtime.     omeprazole 20 MG capsule  Commonly known as:  PRILOSEC  Take 40 mg by mouth daily.     ondansetron 4 MG tablet  Commonly known as:  ZOFRAN  Take 1 tablet (4 mg total) by mouth every 6 (six) hours as needed for nausea.     oxyCODONE 5 MG immediate release tablet  Commonly known as:  Oxy IR/ROXICODONE  Take 0.5-1 tablets (2.5-5 mg total) by mouth every 4 (four) hours as needed.     salsalate 750 MG tablet  Commonly known as:  DISALCID  Take 1,500 mg by mouth 2 (two) times daily.     simvastatin 80 MG tablet  Commonly known as:  ZOCOR  Take 40 mg by mouth at bedtime.  Discharge Instructions: Please refer to Patient Instructions section of EMR for full details.  Patient was counseled important signs and symptoms that should prompt return to medical care, changes in medications, dietary instructions, activity restrictions, and follow up appointments.   Follow-Up Appointments: Follow-up Information   Follow up with [facility]. Schedule an appointment as soon as possible for a visit in 1 week.   Contact information:   [address] [place], [place] [address] [phone]     Gretta Began, Medical Student 02/14/2013, 4:48 PM MS-4, [facility]  FPTS Upper Level Resident Addendum  I independently interviewed and examined this patient. Jason [name]'s discharge planning as outlined above was discussed and arranged by the FPTS team as a whole, including attending physician Dr. McDiarmid. I have discussed the above with Jason [name] and agree with his documentation as above. The above reflects his original note with minor edits for correction/clarification/emphasis.  Bobbye Morton, MD PGY-2, [facility] 02/15/2013, 9:23 AM FPTS Service pager: [phone] (text pages welcome through [facility])

## 2013-02-15 NOTE — Discharge Summary (Signed)
I have seen and examined this patient. I have discussed with Dr Street.  I agree with their findings and plans as documented in their progress note.    

## 2013-02-15 NOTE — Progress Notes (Signed)
I discussed with  Jason [name] I agree with their plans documented in their progress note for today.

## 2014-04-13 ENCOUNTER — Encounter (HOSPITAL_COMMUNITY): Payer: Self-pay | Admitting: Radiology

## 2014-04-13 ENCOUNTER — Inpatient Hospital Stay (HOSPITAL_COMMUNITY): Payer: Medicare Other

## 2014-04-13 ENCOUNTER — Inpatient Hospital Stay (HOSPITAL_COMMUNITY)
Admission: EM | Admit: 2014-04-13 | Discharge: 2014-04-14 | DRG: 069 | Disposition: A | Discharge status: Home / Self Care | Payer: Medicare Other | Attending: Internal Medicine | Admitting: Internal Medicine

## 2014-04-13 ENCOUNTER — Emergency Department (HOSPITAL_COMMUNITY): Payer: Medicare Other

## 2014-04-13 DIAGNOSIS — Z79899 Other long term (current) drug therapy: Secondary | ICD-10-CM | POA: Diagnosis not present

## 2014-04-13 DIAGNOSIS — R32 Unspecified urinary incontinence: Secondary | ICD-10-CM | POA: Diagnosis present

## 2014-04-13 DIAGNOSIS — Z7982 Long term (current) use of aspirin: Secondary | ICD-10-CM

## 2014-04-13 DIAGNOSIS — N179 Acute kidney failure, unspecified: Secondary | ICD-10-CM | POA: Diagnosis present

## 2014-04-13 DIAGNOSIS — Z8546 Personal history of malignant neoplasm of prostate: Secondary | ICD-10-CM | POA: Diagnosis not present

## 2014-04-13 DIAGNOSIS — K2100 Gastro-esophageal reflux disease with esophagitis, without bleeding: Secondary | ICD-10-CM

## 2014-04-13 DIAGNOSIS — Z880 Allergy status to penicillin: Secondary | ICD-10-CM

## 2014-04-13 DIAGNOSIS — I6523 Occlusion and stenosis of bilateral carotid arteries: Secondary | ICD-10-CM | POA: Diagnosis present

## 2014-04-13 DIAGNOSIS — K219 Gastro-esophageal reflux disease without esophagitis: Secondary | ICD-10-CM | POA: Diagnosis present

## 2014-04-13 DIAGNOSIS — Z8673 Personal history of transient ischemic attack (TIA), and cerebral infarction without residual deficits: Secondary | ICD-10-CM

## 2014-04-13 DIAGNOSIS — E876 Hypokalemia: Secondary | ICD-10-CM | POA: Diagnosis present

## 2014-04-13 DIAGNOSIS — I10 Essential (primary) hypertension: Secondary | ICD-10-CM | POA: Diagnosis present

## 2014-04-13 DIAGNOSIS — J189 Pneumonia, unspecified organism: Secondary | ICD-10-CM

## 2014-04-13 DIAGNOSIS — E785 Hyperlipidemia, unspecified: Secondary | ICD-10-CM | POA: Diagnosis present

## 2014-04-13 DIAGNOSIS — Z87891 Personal history of nicotine dependence: Secondary | ICD-10-CM | POA: Diagnosis not present

## 2014-04-13 DIAGNOSIS — G459 Transient cerebral ischemic attack, unspecified: Secondary | ICD-10-CM

## 2014-04-13 DIAGNOSIS — F319 Bipolar disorder, unspecified: Secondary | ICD-10-CM | POA: Diagnosis present

## 2014-04-13 DIAGNOSIS — R471 Dysarthria and anarthria: Secondary | ICD-10-CM | POA: Diagnosis present

## 2014-04-13 DIAGNOSIS — F329 Major depressive disorder, single episode, unspecified: Secondary | ICD-10-CM

## 2014-04-13 DIAGNOSIS — G8194 Hemiplegia, unspecified affecting left nondominant side: Secondary | ICD-10-CM | POA: Diagnosis present

## 2014-04-13 DIAGNOSIS — I517 Cardiomegaly: Secondary | ICD-10-CM

## 2014-04-13 DIAGNOSIS — F32A Depression, unspecified: Secondary | ICD-10-CM | POA: Diagnosis present

## 2014-04-13 DIAGNOSIS — G8104 Flaccid hemiplegia affecting left nondominant side: Secondary | ICD-10-CM

## 2014-04-13 DIAGNOSIS — M7918 Myalgia, other site: Secondary | ICD-10-CM

## 2014-04-13 DIAGNOSIS — R2689 Other abnormalities of gait and mobility: Secondary | ICD-10-CM

## 2014-04-13 DIAGNOSIS — I639 Cerebral infarction, unspecified: Secondary | ICD-10-CM

## 2014-04-13 LAB — DIFFERENTIAL
BASOS ABS: 0 10*3/uL (ref 0.0–0.1)
BASOS PCT: 0 % (ref 0–1)
EOS ABS: 0.1 10*3/uL (ref 0.0–0.7)
Eosinophils Relative: 1 % (ref 0–5)
Lymphocytes Relative: 24 % (ref 12–46)
Lymphs Abs: 2.6 10*3/uL (ref 0.7–4.0)
MONO ABS: 0.5 10*3/uL (ref 0.1–1.0)
Monocytes Relative: 5 % (ref 3–12)
NEUTROS ABS: 7.4 10*3/uL (ref 1.7–7.7)
NEUTROS PCT: 70 % (ref 43–77)

## 2014-04-13 LAB — COMPREHENSIVE METABOLIC PANEL
ALBUMIN: 3.7 g/dL (ref 3.5–5.2)
ALT: 45 U/L (ref 0–53)
AST: 37 U/L (ref 0–37)
Alkaline Phosphatase: 171 U/L — ABNORMAL HIGH (ref 39–117)
Anion gap: 16 — ABNORMAL HIGH (ref 5–15)
BUN: 18 mg/dL (ref 6–23)
CHLORIDE: 101 meq/L (ref 96–112)
CO2: 26 mEq/L (ref 19–32)
CREATININE: 1.48 mg/dL — AB (ref 0.50–1.35)
Calcium: 8.6 mg/dL (ref 8.4–10.5)
GFR calc Af Amer: 55 mL/min — ABNORMAL LOW (ref >90)
GFR calc non Af Amer: 47 mL/min — ABNORMAL LOW (ref >90)
Glucose, Bld: 165 mg/dL — ABNORMAL HIGH (ref 70–99)
POTASSIUM: 3.3 meq/L — AB (ref 3.7–5.3)
Sodium: 143 mEq/L (ref 137–147)
TOTAL PROTEIN: 7.4 g/dL (ref 6.0–8.3)

## 2014-04-13 LAB — CBC
HCT: 33.4 % — ABNORMAL LOW (ref 39.0–52.0)
Hemoglobin: 10.8 g/dL — ABNORMAL LOW (ref 13.0–17.0)
MCH: 27.9 pg (ref 26.0–34.0)
MCHC: 32.3 g/dL (ref 30.0–36.0)
MCV: 86.3 fL (ref 78.0–100.0)
Platelets: 302 10*3/uL (ref 150–400)
RBC: 3.87 MIL/uL — ABNORMAL LOW (ref 4.22–5.81)
RDW: 13.9 % (ref 11.5–15.5)
WBC: 10.6 10*3/uL — ABNORMAL HIGH (ref 4.0–10.5)

## 2014-04-13 LAB — PROTIME-INR
INR: 1.12 (ref 0.00–1.49)
Prothrombin Time: 14.4 seconds (ref 11.6–15.2)

## 2014-04-13 LAB — I-STAT CHEM 8, ED
BUN: 18 mg/dL (ref 6–23)
CHLORIDE: 103 meq/L (ref 96–112)
Calcium, Ion: 1.07 mmol/L — ABNORMAL LOW (ref 1.13–1.30)
Creatinine, Ser: 1.7 mg/dL — ABNORMAL HIGH (ref 0.50–1.35)
GLUCOSE: 165 mg/dL — AB (ref 70–99)
HEMATOCRIT: 36 % — AB (ref 39.0–52.0)
Hemoglobin: 12.2 g/dL — ABNORMAL LOW (ref 13.0–17.0)
POTASSIUM: 3.1 meq/L — AB (ref 3.7–5.3)
SODIUM: 142 meq/L (ref 137–147)
TCO2: 26 mmol/L (ref 0–100)

## 2014-04-13 LAB — CBG MONITORING, ED: GLUCOSE-CAPILLARY: 148 mg/dL — AB (ref 70–99)

## 2014-04-13 LAB — HEMOGLOBIN A1C
HEMOGLOBIN A1C: 5.7 % — AB (ref <5.7)
MEAN PLASMA GLUCOSE: 117 mg/dL — AB (ref <117)

## 2014-04-13 LAB — I-STAT TROPONIN, ED: TROPONIN I, POC: 0 ng/mL (ref 0.00–0.08)

## 2014-04-13 LAB — APTT: APTT: 24 s (ref 24–37)

## 2014-04-13 MED ORDER — PANTOPRAZOLE SODIUM 40 MG PO TBEC
40.0000 mg | DELAYED_RELEASE_TABLET | Freq: Every day | ORAL | Status: DC
  Administered 2014-04-13 – 2014-04-14 (×2): 40 mg via ORAL
  Filled 2014-04-13 (×2): qty 1

## 2014-04-13 MED ORDER — SODIUM CHLORIDE 0.9 % IV SOLN
INTRAVENOUS | Status: DC
  Administered 2014-04-13 – 2014-04-14 (×2): 75 mL via INTRAVENOUS

## 2014-04-13 MED ORDER — DIVALPROEX SODIUM ER 500 MG PO TB24
1750.0000 mg | ORAL_TABLET | Freq: Every evening | ORAL | Status: DC
  Administered 2014-04-13: 1750 mg via ORAL
  Filled 2014-04-13 (×2): qty 1

## 2014-04-13 MED ORDER — SENNOSIDES-DOCUSATE SODIUM 8.6-50 MG PO TABS
1.0000 | ORAL_TABLET | Freq: Every evening | ORAL | Status: DC | PRN
  Filled 2014-04-13: qty 1

## 2014-04-13 MED ORDER — HEPARIN SODIUM (PORCINE) 5000 UNIT/ML IJ SOLN
5000.0000 [IU] | Freq: Three times a day (TID) | INTRAMUSCULAR | Status: DC
  Administered 2014-04-13 (×3): 5000 [IU] via SUBCUTANEOUS
  Filled 2014-04-13 (×4): qty 1

## 2014-04-13 MED ORDER — ASPIRIN EC 81 MG PO TBEC
81.0000 mg | DELAYED_RELEASE_TABLET | Freq: Every day | ORAL | Status: DC
  Administered 2014-04-13 – 2014-04-14 (×2): 81 mg via ORAL
  Filled 2014-04-13 (×3): qty 1

## 2014-04-13 MED ORDER — POTASSIUM CHLORIDE CRYS ER 20 MEQ PO TBCR
40.0000 meq | EXTENDED_RELEASE_TABLET | Freq: Once | ORAL | Status: AC
Start: 2014-04-13 — End: 2014-04-13
  Administered 2014-04-13: 40 meq via ORAL
  Filled 2014-04-13: qty 2

## 2014-04-13 MED ORDER — GLUCOSAMINE-CHONDROITIN 500-400 MG PO TABS: 1.0000 | ORAL_TABLET | Freq: Two times a day (BID) | ORAL | Status: DC

## 2014-04-13 MED ORDER — ATORVASTATIN CALCIUM 40 MG PO TABS
40.0000 mg | ORAL_TABLET | Freq: Every day | ORAL | Status: DC
  Administered 2014-04-13: 40 mg via ORAL
  Filled 2014-04-13 (×2): qty 1

## 2014-04-13 MED ORDER — IOHEXOL 350 MG/ML SOLN
50.0000 mL | Freq: Once | INTRAVENOUS | Status: AC | PRN
  Administered 2014-04-13: 50 mL via INTRAVENOUS

## 2014-04-13 MED ORDER — MIRTAZAPINE 45 MG PO TABS
45.0000 mg | ORAL_TABLET | Freq: Every day | ORAL | Status: DC
  Administered 2014-04-13: 45 mg via ORAL
  Filled 2014-04-13 (×2): qty 1

## 2014-04-13 MED ORDER — STROKE: EARLY STAGES OF RECOVERY BOOK
Freq: Once | Status: AC
  Administered 2014-04-13: 07:00:00
  Filled 2014-04-13: qty 1

## 2014-04-13 MED ORDER — SODIUM CHLORIDE 0.9 % IV BOLUS (SEPSIS)
500.0000 mL | Freq: Once | INTRAVENOUS | Status: AC
  Administered 2014-04-13: 500 mL via INTRAVENOUS

## 2014-04-13 MED ORDER — FLUOXETINE HCL 20 MG PO CAPS
60.0000 mg | ORAL_CAPSULE | Freq: Every day | ORAL | Status: DC
  Administered 2014-04-13 – 2014-04-14 (×2): 60 mg via ORAL
  Filled 2014-04-13 (×2): qty 3

## 2014-04-13 MED ORDER — LORAZEPAM 2 MG/ML IJ SOLN
1.0000 mg | Freq: Once | INTRAMUSCULAR | Status: AC
  Administered 2014-04-13: 1 mg via INTRAVENOUS
  Filled 2014-04-13: qty 1

## 2014-04-13 MED ORDER — NIACIN ER 500 MG PO CPCR
1000.0000 mg | ORAL_CAPSULE | Freq: Every day | ORAL | Status: DC
  Administered 2014-04-13: 1000 mg via ORAL
  Filled 2014-04-13 (×2): qty 2

## 2014-04-13 NOTE — Procedures (Signed)
ELECTROENCEPHALOGRAM REPORT   Patient: Jason [name]       Room #: 2I94 EEG No. ID: [id number] Age: 68 y.o.        Sex: male Referring Physician: Ree Kida Report Date:  04/13/2014        Interpreting Physician: Alexis Goodell D  History: Lautaro Koral is an 68 y.o. male presenting after a presyncopal episode  Medications:  Scheduled: . aspirin EC  81 mg Oral Daily  . atorvastatin  40 mg Oral q1800  . divalproex  1,750 mg Oral QPM  . FLUoxetine  60 mg Oral Daily  . heparin  5,000 Units Subcutaneous 3 times per day  . mirtazapine  45 mg Oral QHS  . niacin  1,000 mg Oral QHS  . pantoprazole  40 mg Oral Daily    Conditions of Recording:  This is a 16 channel EEG carried out with the patient in the awake, drowsy and asleep states.  Description:  The waking background activity consists of a low voltage, symmetrical, fairly well organized, 8 Hz alpha activity, seen from the parieto-occipital and posterior temporal regions.  Low voltage fast activity, poorly organized, is seen anteriorly and is at times superimposed on more posterior regions.  A mixture of theta and alpha rhythms are seen from the central and temporal regions.  The patient drowses with slowing to irregular, low voltage theta and beta activity.    The patient goes in to a light sleep with symmetrical sleep spindles, vertex central sharp transients and irregular slow activity.   Hyperventilation and intermittent photic stimulation were not performed.  IMPRESSION: This is a normal electroencephalogram.  No epileptiform activity is noted.     Alexis Goodell, MD [facility] [phone] 04/13/2014, 12:02 PM

## 2014-04-13 NOTE — Progress Notes (Signed)
OT Cancellation Note  Patient Details Name: Ugo Thoma MRN: [id number] DOB: 07/24/1945   Cancelled Treatment:    Reason Eval/Treat Not Completed: Patient not medically ready. Pt on bedrest till 5pm tonight. Will re-attempt to see pt at next available time.    Benito Mccreedy OTR/L [phone] 04/13/2014, 3:40 PM

## 2014-04-13 NOTE — Consult Note (Signed)
Referring Physician: ED    Chief Complaint: code stroke, left hemiparesis, speech impairment  HPI:                                                                                                                                         Jason [name] is an 68 y.o. 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 He said that he was sitting in front of his computer when suddenly developed an odd feeling, then became very sweaty, confused, and lost control of his bladder. He is unsure if he lost consciousness. EMS was summoned and find him with flaccid weakness left side, having speech difficulty, and with SBP in the low 80's,  but otherwise able to answer questions appropriately. NIHSS 5. CT/CTA brain showed no acute intracranial abnormality. Date last known well: 04/13/14 Time last known well: 12:30 am tPA Given: no: NIHSS: 5   Past Medical History  Diagnosis Date  . Hypertension   . Stroke   . Prostate cancer   . Depression   . Bipolar 1 disorder   . Acid reflux disease with ulcer     Past Surgical History  Procedure Laterality Date  . Prostate surgery      No family history on file. Social History:  reports that he quit smoking about 11 years ago. He does not have any smokeless tobacco history on file. He reports that he does not drink alcohol or use illicit drugs.  Allergies:  Allergies  Allergen Reactions  . Penicillins     Unknown reaction    Medications:                                                                                                                           I have reviewed the patient's current medications.  ROS:  History obtained from the patient and  EMS  General ROS: negative for - chills, fatigue, fever, night sweats, or weight loss Psychological ROS: negative for - behavioral disorder, hallucinations, memory difficulties Ophthalmic ROS: negative for - blurry vision, double vision, eye pain or loss of vision ENT ROS: negative for - epistaxis, nasal discharge, oral lesions, sore throat, tinnitus or vertigo Allergy and Immunology ROS: negative for - hives or itchy/watery eyes Hematological and Lymphatic ROS: negative for - bleeding problems, bruising or swollen lymph nodes Endocrine ROS: negative for - galactorrhea, hair pattern changes, polydipsia/polyuria or temperature intolerance Respiratory ROS: negative for - cough, hemoptysis, shortness of breath or wheezing Cardiovascular ROS: negative for - chest pain, dyspnea on exertion, edema or irregular heartbeat Gastrointestinal ROS: negative for - abdominal pain, diarrhea, hematemesis, nausea/vomiting or stool incontinence Genito-Urinary ROS: negative for - dysuria, hematuria, incontinence or urinary frequency/urgency Musculoskeletal ROS: negative for - joint swelling Neurological ROS: as noted in HPI Dermatological ROS: negative for rash and skin lesion changes  Physical exam: pleasant male in no apparent distress. Blood pressure 105/55, pulse 79, temperature 97.4 F (36.3 C), temperature source Oral, resp. rate 16, SpO2 100.00%. Head: normocephalic. Neck: supple, no bruits, no JVD. Cardiac: no murmurs. Lungs: clear. Abdomen: soft, no tender, no mass. Extremities: no edema.  Neurologic Examination:                                                                                                      General: Mental Status: Alert, oriented, thought content appropriate.  ? Mild dysarthria without evidence of aphasia.  Able to follow 3 step commands without difficulty. Cranial Nerves: II: Discs flat bilaterally; Visual fields grossly normal, pupils equal, round, reactive to light and  accommodation III,IV, VI: ptosis not present, extra-ocular motions intact bilaterally V,VII: smile symmetric, facial light touch sensation normal bilaterally VIII: hearing normal bilaterally IX,X: gag reflex present XI: bilateral shoulder shrug XII: midline tongue extension without atrophy or fasciculations Motor: Mild left sided weakness Tone and bulk:normal tone throughout; no atrophy noted Sensory: Pinprick and light touch intact throughout, bilaterally Deep Tendon Reflexes:  Right: Upper Extremity   Left: Upper extremity   biceps (C-5 to C-6) 2/4   biceps (C-5 to C-6) 2/4 tricep (C7) 2/4    triceps (C7) 2/4 Brachioradialis (C6) 2/4  Brachioradialis (C6) 2/4  Lower Extremity Lower Extremity  quadriceps (L-2 to L-4) 2/4   quadriceps (L-2 to L-4) 2/4 Achilles (S1) 2/4   Achilles (S1) 2/4 Plantars: Right: downgoing   Left: downgoing Cerebellar: normal finger-to-nose,  normal heel-to-shin test Gait:  No tested CV: pulses palpable throughout    Results for orders placed during the hospital encounter of 04/13/14 (from the past 48 hour(s))  PROTIME-INR     Status: None   Collection Time    04/13/14  1:39 AM      Result Value Ref Range   Prothrombin Time 14.4  11.6 - 15.2 seconds   INR 1.12  0.00 - 1.49  APTT     Status: None   Collection Time    04/13/14  1:39  AM      Result Value Ref Range   aPTT 24  24 - 37 seconds  CBC     Status: Abnormal   Collection Time    04/13/14  1:39 AM      Result Value Ref Range   WBC 10.6 (*) 4.0 - 10.5 K/uL   RBC 3.87 (*) 4.22 - 5.81 MIL/uL   Hemoglobin 10.8 (*) 13.0 - 17.0 g/dL   HCT 33.4 (*) 39.0 - 52.0 %   MCV 86.3  78.0 - 100.0 fL   MCH 27.9  26.0 - 34.0 pg   MCHC 32.3  30.0 - 36.0 g/dL   RDW 13.9  11.5 - 15.5 %   Platelets 302  150 - 400 K/uL  DIFFERENTIAL     Status: None   Collection Time    04/13/14  1:39 AM      Result Value Ref Range   Neutrophils Relative % 70  43 - 77 %   Neutro Abs 7.4  1.7 - 7.7 K/uL   Lymphocytes  Relative 24  12 - 46 %   Lymphs Abs 2.6  0.7 - 4.0 K/uL   Monocytes Relative 5  3 - 12 %   Monocytes Absolute 0.5  0.1 - 1.0 K/uL   Eosinophils Relative 1  0 - 5 %   Eosinophils Absolute 0.1  0.0 - 0.7 K/uL   Basophils Relative 0  0 - 1 %   Basophils Absolute 0.0  0.0 - 0.1 K/uL  I-STAT TROPOININ, ED     Status: None   Collection Time    04/13/14  1:45 AM      Result Value Ref Range   Troponin i, poc 0.00  0.00 - 0.08 ng/mL   Comment 3            Comment: Due to the release kinetics of cTnI,     a negative result within the first hours     of the onset of symptoms does not rule out     myocardial infarction with certainty.     If myocardial infarction is still suspected,     repeat the test at appropriate intervals.  I-STAT CHEM 8, ED     Status: Abnormal   Collection Time    04/13/14  1:49 AM      Result Value Ref Range   Sodium 142  137 - 147 mEq/L   Potassium 3.1 (*) 3.7 - 5.3 mEq/L   Chloride 103  96 - 112 mEq/L   BUN 18  6 - 23 mg/dL   Creatinine, Ser 1.70 (*) 0.50 - 1.35 mg/dL   Glucose, Bld 165 (*) 70 - 99 mg/dL   Calcium, Ion 1.07 (*) 1.13 - 1.30 mmol/L   TCO2 26  0 - 100 mmol/L   Hemoglobin 12.2 (*) 13.0 - 17.0 g/dL   HCT 36.0 (*) 39.0 - 52.0 %  CBG MONITORING, ED     Status: Abnormal   Collection Time    04/13/14  2:07 AM      Result Value Ref Range   Glucose-Capillary 148 (*) 70 - 99 mg/dL   Comment 1 Notify RN     Ct Head (brain) Wo Contrast  04/13/2014   CLINICAL DATA:  Code stroke. Left-sided weakness, ataxia and difficulty speaking. Initial encounter.  EXAM: CT HEAD WITHOUT CONTRAST  TECHNIQUE: Contiguous axial images were obtained from the base of the skull through the vertex without intravenous contrast.  COMPARISON:  CT of the  head performed 02/12/2013  FINDINGS: There is no evidence of acute infarction, mass lesion, or intra- or extra-axial hemorrhage on CT.  Prominence of the ventricles and sulci reflects moderate cortical volume loss. Diffuse  periventricular white matter change likely reflects small vessel ischemic microangiopathy. A chronic infarct is noted at the right cerebellar hemisphere, with associated encephalomalacia. Mild chronic ischemic change is noted at the basal ganglia bilaterally.  The brainstem and fourth ventricle are within normal limits. The cerebral hemispheres demonstrate grossly normal gray-white differentiation. No mass effect or midline shift is seen.  There is no evidence of fracture; visualized osseous structures are unremarkable in appearance. The visualized portions of the orbits are within normal limits. The paranasal sinuses and mastoid air cells are well-aerated. No significant soft tissue abnormalities are seen.  IMPRESSION: 1. No acute intracranial pathology seen on CT. 2. Moderate cortical volume loss and diffuse small vessel ischemic microangiopathy. 3. Chronic infarct at the right cerebellar hemisphere; mild chronic ischemic change at the basal ganglia bilaterally.  These results were called by telephone at the time of interpretation on 04/13/2014 at 1:52 am to Dr. Armida Sans, who verbally acknowledged these results.   Electronically Signed   By: Garald Balding M.D.   On: 04/13/2014 01:52     Assessment: 68 y.o. male brought in via EMS due to acute onset left hemiparesis, speech impairment. Patient had confusion and bladder incontinence. NIHSS 5, CT/CTA brain unremarkable for acute abnormality. I am unsure if patient had a seizure, a syncopal episode, or stroke, and thus did not treat with IV thrombolysis. Admit to medicine, complete stroke work up. Aspirin  Stroke Risk Factors - HTN, prior stroke  Plan: 1. HgbA1c, fasting lipid panel 2. MRI, MRA  of the brain without contrast 3. Echocardiogram 4. Carotid dopplers 5. Prophylactic therapy-aspirin 6. Risk factor modification 7. Telemetry monitoring 8. Frequent neuro checks 9. PT/OT SLP   Dorian Pod, MD [facility] [phone]  04/13/2014, 2:24 AM

## 2014-04-13 NOTE — ED Notes (Signed)
ECG HR erroneous d/t shivering and tremors. HR 88 manually. Correlates with pulse ox. Given warm blankets. IVF changed to warm IVF. family x2 at [facility].

## 2014-04-13 NOTE — ED Provider Notes (Signed)
CSN: [id number]     Arrival date & time 04/13/14  0138 History   First MD Initiated Contact with Patient 04/13/14 0214     Chief Complaint  Patient presents with  . Code Stroke    An emergency department physician performed an initial assessment on this suspected stroke patient at 68. (Consider location/radiation/quality/duration/timing/severity/associated sxs/prior Treatment) HPI 68 yo male presents from home via EMS as code stroke.  Pt reports around 1230 was sitting at computer and began to feel ill.  Pt lost control of urine, was shaky, clammy.  He attempted to get cleaned up and had profound left sided weakness.  Pt with improved left sided weakness upon arrival, but has some slurred speech, perseveration of speech, ataxia.  Pt has h/o stroke in 2004 and had similar ataxia a year ago when he had pneumonia.  No headache, no head trauma.  Pt reports he had been off all of his medications for some time, but recently got them refilled and started back on them yesterday.  Pt met at the bridge with Dr Aram Beecham with neurology and myself. Past Medical History  Diagnosis Date  . Hypertension   . Stroke   . Prostate cancer   . Depression   . Bipolar 1 disorder   . Acid reflux disease with ulcer    Past Surgical History  Procedure Laterality Date  . Prostate surgery     No family history on file. History  Substance Use Topics  . Smoking status: Former Smoker    Quit date: 03/06/2003  . Smokeless tobacco: Not on file  . Alcohol Use: No    Review of Systems  All other systems reviewed and are negative.     Allergies  Penicillins  Home Medications   Prior to Admission medications   Medication Sig Start Date End Date Taking? Authorizing Provider  aspirin EC 81 MG EC tablet Take 1 tablet (81 mg total) by mouth daily. 02/14/13  Yes Sharon Mt Street, MD  divalproex (DEPAKOTE ER) 250 MG 24 hr tablet Take 1,750 mg by mouth every evening.   Yes Historical Provider, MD   FLUoxetine (PROZAC) 20 MG capsule Take 60 mg by mouth daily.   Yes Historical Provider, MD  glucosamine-chondroitin 500-400 MG tablet Take 1 tablet by mouth 2 (two) times daily.    Yes Historical Provider, MD  lisinopril-hydrochlorothiazide (PRINZIDE,ZESTORETIC) 20-12.5 MG per tablet Take 1 tablet by mouth daily.   Yes Historical Provider, MD  Magnesium Hydroxide (MAGNESIA PO) Take 1 tablet by mouth daily as needed (for headache).   Yes Historical Provider, MD  mirtazapine (REMERON) 45 MG tablet Take 45 mg by mouth at bedtime.   Yes Historical Provider, MD  niacin (SLO-NIACIN) 500 MG tablet Take 1,000 mg by mouth at bedtime.   Yes Historical Provider, MD  omeprazole (PRILOSEC) 20 MG capsule Take 40 mg by mouth daily.   Yes Historical Provider, MD  salsalate (DISALCID) 750 MG tablet Take 1,500 mg by mouth 2 (two) times daily.   Yes Historical Provider, MD  simvastatin (ZOCOR) 80 MG tablet Take 40 mg by mouth at bedtime.    Yes Historical Provider, MD   BP 110/65  Pulse 86  Temp(Src) 97.4 F (36.3 C) (Oral)  Resp 13  SpO2 100% Physical Exam  Nursing note and vitals reviewed. Constitutional: He is oriented to person, place, and time. He appears well-developed and well-nourished.  HENT:  Head: Normocephalic and atraumatic.  Nose: Nose normal.  Mouth/Throat: Oropharynx is clear and moist.  Eyes: Conjunctivae and EOM are normal. Pupils are equal, round, and reactive to light.  Neck: Normal range of motion. Neck supple. No JVD present. No tracheal deviation present. No thyromegaly present.  Cardiovascular: Normal rate, regular rhythm, normal heart sounds and intact distal pulses.  Exam reveals no gallop and no friction rub.   No murmur heard. Pulmonary/Chest: Effort normal and breath sounds normal. No stridor. No respiratory distress. He has no wheezes. He has no rales. He exhibits no tenderness.  Abdominal: Soft. Bowel sounds are normal. He exhibits no distension and no mass. There is no  tenderness. There is no rebound and no guarding.  Musculoskeletal: Normal range of motion. He exhibits no edema and no tenderness.  Lymphadenopathy:    He has no cervical adenopathy.  Neurological: He is alert and oriented to person, place, and time. He displays normal reflexes. A cranial nerve deficit (left sided facial droop) is present. He exhibits normal muscle tone. Coordination (abnormal fnf, ataxia noted, weakness of left arm/leg compared to right) abnormal.  Rambling speech, perseveration  Skin: Skin is warm and dry. No rash noted. No erythema. No pallor.  Psychiatric: He has a normal mood and affect. His behavior is normal. Judgment and thought content normal.    ED Course  Procedures (including critical care time) Labs Review Labs Reviewed  CBC - Abnormal; Notable for the following:    WBC 10.6 (*)    RBC 3.87 (*)    Hemoglobin 10.8 (*)    HCT 33.4 (*)    All other components within normal limits  COMPREHENSIVE METABOLIC PANEL - Abnormal; Notable for the following:    Potassium 3.3 (*)    Glucose, Bld 165 (*)    Creatinine, Ser 1.48 (*)    Alkaline Phosphatase 171 (*)    Total Bilirubin <0.2 (*)    GFR calc non Af Amer 47 (*)    GFR calc Af Amer 55 (*)    Anion gap 16 (*)    All other components within normal limits  CBG MONITORING, ED - Abnormal; Notable for the following:    Glucose-Capillary 148 (*)    All other components within normal limits  I-STAT CHEM 8, ED - Abnormal; Notable for the following:    Potassium 3.1 (*)    Creatinine, Ser 1.70 (*)    Glucose, Bld 165 (*)    Calcium, Ion 1.07 (*)    Hemoglobin 12.2 (*)    HCT 36.0 (*)    All other components within normal limits  PROTIME-INR  APTT  DIFFERENTIAL  HEMOGLOBIN A1C  I-STAT TROPOININ, ED    Imaging Review Ct Angio Head W/cm &/or Wo Cm  04/13/2014   CLINICAL DATA:  Code stroke.  EXAM: CT ANGIOGRAPHY HEAD  TECHNIQUE: Multidetector CT imaging of the head was performed using the standard protocol  during bolus administration of intravenous contrast. Multiplanar CT image reconstructions and MIPs were obtained to evaluate the vascular anatomy.  CONTRAST:  76mL OMNIPAQUE IOHEXOL 350 MG/ML SOLN  COMPARISON:  Prior noncontrast head CT performed earlier on the same day.  FINDINGS: ANTERIOR CIRCULATION:  Visualized distal cervical segments of the internal carotid arteries are widely patent. Petrous segments within normal limits. Irregular calcified atheromatous plaque present throughout the cavernous segments of the internal carotid arteries bilaterally with associated multi focal stenoses of approximately 30-40%, slightly worse on the left. Supra clinoid segments well opacified.  A1 segments, anterior communicating artery, and anterior cerebral arteries are well opacified.  M1 segments are widely patent  without hemodynamically significant stenosis or proximal branch occlusion. MCA bifurcations and distal MCA branches within normal limits.  POSTERIOR CIRCULATION:  Left vertebral artery is dominant. Scattered atherosclerotic plaque present within the distal left vertebral artery without significant stenosis. Posterior inferior cerebral arteries grossly patent. Vertebrobasilar junction and basilar artery are within normal limits. There is mild ectasia of the basilar tip without focal aneurysm. Posterior cerebral arteries are patent bilaterally. Superior cerebellar arteries within normal limits. Proximal anterior inferior cerebral arteries are patent bilaterally.  Atrophy with chronic microvascular ischemic disease again noted. Chronic right cerebellar infarct again noted. No acute intracranial hemorrhage. No abnormal enhancement seen on delayed sequence.  IMPRESSION: 1. No proximal branch occlusion identified within the intracranial circulation. 2. Moderate multi focal atherosclerotic irregularity within the cavernous segments of the internal carotid arteries bilaterally. 3. Mild ectasia of the basilar tip without  focal aneurysm. Results were called by telephone at the time of interpretation on 04/13/2014 at 3:01 am to Dr. Dorian Pod , who verbally acknowledged these results.   Electronically Signed   By: Jeannine Boga M.D.   On: 04/13/2014 03:05   Ct Head (brain) Wo Contrast  04/13/2014   CLINICAL DATA:  Code stroke. Left-sided weakness, ataxia and difficulty speaking. Initial encounter.  EXAM: CT HEAD WITHOUT CONTRAST  TECHNIQUE: Contiguous axial images were obtained from the base of the skull through the vertex without intravenous contrast.  COMPARISON:  CT of the head performed 02/12/2013  FINDINGS: There is no evidence of acute infarction, mass lesion, or intra- or extra-axial hemorrhage on CT.  Prominence of the ventricles and sulci reflects moderate cortical volume loss. Diffuse periventricular white matter change likely reflects small vessel ischemic microangiopathy. A chronic infarct is noted at the right cerebellar hemisphere, with associated encephalomalacia. Mild chronic ischemic change is noted at the basal ganglia bilaterally.  The brainstem and fourth ventricle are within normal limits. The cerebral hemispheres demonstrate grossly normal gray-white differentiation. No mass effect or midline shift is seen.  There is no evidence of fracture; visualized osseous structures are unremarkable in appearance. The visualized portions of the orbits are within normal limits. The paranasal sinuses and mastoid air cells are well-aerated. No significant soft tissue abnormalities are seen.  IMPRESSION: 1. No acute intracranial pathology seen on CT. 2. Moderate cortical volume loss and diffuse small vessel ischemic microangiopathy. 3. Chronic infarct at the right cerebellar hemisphere; mild chronic ischemic change at the basal ganglia bilaterally.  These results were called by telephone at the time of interpretation on 04/13/2014 at 1:52 am to Dr. Armida Sans, who verbally acknowledged these results.   Electronically  Signed   By: Garald Balding M.D.   On: 04/13/2014 01:52     EKG Interpretation None     CRITICAL CARE Performed by: Kalman Drape Total critical care time: 60 min Critical care time was exclusive of separately billable procedures and treating other patients. Critical care was necessary to treat or prevent imminent or life-threatening deterioration. Critical care was time spent personally by me on the following activities: development of treatment plan with patient and/or surrogate as well as nursing, discussions with consultants, evaluation of patient's response to treatment, examination of patient, obtaining history from patient or surrogate, ordering and performing treatments and interventions, ordering and review of laboratory studies, ordering and review of radiographic studies, pulse oximetry and re-evaluation of patient's condition.  MDM   Final diagnoses:  CVA (cerebral vascular accident)    68 yo male with urinary incontinence, left sided weakness and ataxia,  sxs improving during ED evaluation.  Pt seen by Dr Aram Beecham who recommends further inpatient evaluation and treatment.  Will d/w hospitalist for admission.    Kalman Drape, MD 04/13/14 [phone]

## 2014-04-13 NOTE — H&P (Addendum)
[facility] History and Physical  Jason [name] TKW:[id number] DOB: 1945-09-08 DOA: 04/13/2014  Referring physician: ED physician PCP: No primary provider on file.  Specialists:   Chief Complaint: Dysarthria and confusion  HPI: Jason [name] is a 68 y.o. male with PMH of HTN, cerebellar infarct with mild residual left sided weakness, prostate cancer, bipolar disorder, who presents with   Patient reports that he was sitting in front of his computer yesterday evening when suddenly developed an odd feeling, then became very sweaty, confused, and lost control of his bladder. When her daughter came home at about 12:30 in the night, he was sitting in chair, shaky and very tired with difficulty in speaking. Patient is unsure if he lost consciousness. EMS was called and found him with flaccid weakness in the left side, having speech difficulty, and with SBP in the low 80's. Patient has a mild headache. No vision or hearing loss. He does not have fever, chills, chest pain, shortness of breath, nausea, vomiting, abdominal pain, diarrhea.  CT/CTA brain showed no acute intracranial abnormality. Neurology was consulted. Patient is admitted to the inpatient for further evaluation and treatment.  Review of Systems: As presented in the history of presenting illness, rest negative.  Where does patient live?   Can patient participate in ADLs? Yes  Allergy:  Allergies  Allergen Reactions  . Penicillins     Unknown reaction    Past Medical History  Diagnosis Date  . Hypertension   . Stroke   . Prostate cancer   . Depression   . Bipolar 1 disorder   . Acid reflux disease with ulcer     Past Surgical History  Procedure Laterality Date  . Prostate surgery      Social History:  reports that he quit smoking about 11 years ago. He does not have any smokeless tobacco history on file. He reports that he does not drink alcohol or use illicit drugs.  Family History:  Family History  Problem  Relation Age of Onset  . Stroke Mother     Hemorrhagic stroke  . Cancer Father     Pancreatic cancer  . Hypertension Father      Prior to Admission medications   Medication Sig Start Date End Date Taking? Authorizing Provider  aspirin EC 81 MG EC tablet Take 1 tablet (81 mg total) by mouth daily. 02/14/13  Yes Sharon Mt Street, MD  divalproex (DEPAKOTE ER) 250 MG 24 hr tablet Take 1,750 mg by mouth every evening.   Yes Historical Provider, MD  FLUoxetine (PROZAC) 20 MG capsule Take 60 mg by mouth daily.   Yes Historical Provider, MD  glucosamine-chondroitin 500-400 MG tablet Take 1 tablet by mouth 2 (two) times daily.    Yes Historical Provider, MD  lisinopril-hydrochlorothiazide (PRINZIDE,ZESTORETIC) 20-12.5 MG per tablet Take 1 tablet by mouth daily.   Yes Historical Provider, MD  Magnesium Hydroxide (MAGNESIA PO) Take 1 tablet by mouth daily as needed (for headache).   Yes Historical Provider, MD  mirtazapine (REMERON) 45 MG tablet Take 45 mg by mouth at bedtime.   Yes Historical Provider, MD  niacin (SLO-NIACIN) 500 MG tablet Take 1,000 mg by mouth at bedtime.   Yes Historical Provider, MD  omeprazole (PRILOSEC) 20 MG capsule Take 40 mg by mouth daily.   Yes Historical Provider, MD  salsalate (DISALCID) 750 MG tablet Take 1,500 mg by mouth 2 (two) times daily.   Yes Historical Provider, MD  simvastatin (ZOCOR) 80 MG tablet Take 40 mg by mouth  at bedtime.    Yes Historical Provider, MD    Physical Exam: Filed Vitals:   04/13/14 0345 04/13/14 0400 04/13/14 0415 04/13/14 0417  BP: 126/67 124/68 134/54 134/54  Pulse: 84 92 90   Temp:      TempSrc:      Resp: 19 12  17   SpO2: 97% 97% 97% 97%   General: Not in acute distress HEENT:       Eyes: PERRL, EOMI, no scleral icterus       ENT: No discharge from the ears and nose, no pharynx injection, no tonsillar enlargement.        Neck: No JVD, no bruit, no mass felt. Cardiac: S1/S2, RRR, No murmurs, gallops or rubs Pulm: Good air  movement bilaterally. Clear to auscultation bilaterally. No rales, wheezing, rhonchi or rubs. Abd: Soft, nondistended, nontender, no rebound pain, no organomegaly, BS present Ext: No edema. 2+DP/PT pulse bilaterally Musculoskeletal: No joint deformities, erythema, or stiffness, ROM full Skin: No rashes.  Neuro: Alert and oriented X3, cranial nerves II-XII grossly intact except for left facial mild droop, muscle strength 4/5 in left arm and leg. Sensation to light touch intact. Brachial reflex 2+ bilaterally. Knee reflex 2+ bilaterally. Negative Babinski's sign. Normal finger to nose test.   Labs on Admission:  Basic Metabolic Panel:  Recent Labs Lab 04/13/14 0139 04/13/14 0149  NA 143 142  K 3.3* 3.1*  CL 101 103  CO2 26  --   GLUCOSE 165* 165*  BUN 18 18  CREATININE 1.48* 1.70*  CALCIUM 8.6  --    Liver Function Tests:  Recent Labs Lab 04/13/14 0139  AST 37  ALT 45  ALKPHOS 171*  BILITOT <0.2*  PROT 7.4  ALBUMIN 3.7   No results found for this basename: LIPASE, AMYLASE,  in the last 168 hours No results found for this basename: AMMONIA,  in the last 168 hours CBC:  Recent Labs Lab 04/13/14 0139 04/13/14 0149  WBC 10.6*  --   NEUTROABS 7.4  --   HGB 10.8* 12.2*  HCT 33.4* 36.0*  MCV 86.3  --   PLT 302  --    Cardiac Enzymes: No results found for this basename: CKTOTAL, CKMB, CKMBINDEX, TROPONINI,  in the last 168 hours  BNP (last 3 results) No results found for this basename: PROBNP,  in the last 8760 hours CBG:  Recent Labs Lab 04/13/14 0207  GLUCAP 148*    Radiological Exams on Admission: Ct Angio Head W/cm &/or Wo Cm  04/13/2014   CLINICAL DATA:  Code stroke.  EXAM: CT ANGIOGRAPHY HEAD  TECHNIQUE: Multidetector CT imaging of the head was performed using the standard protocol during bolus administration of intravenous contrast. Multiplanar CT image reconstructions and MIPs were obtained to evaluate the vascular anatomy.  CONTRAST:  69mL OMNIPAQUE  IOHEXOL 350 MG/ML SOLN  COMPARISON:  Prior noncontrast head CT performed earlier on the same day.  FINDINGS: ANTERIOR CIRCULATION:  Visualized distal cervical segments of the internal carotid arteries are widely patent. Petrous segments within normal limits. Irregular calcified atheromatous plaque present throughout the cavernous segments of the internal carotid arteries bilaterally with associated multi focal stenoses of approximately 30-40%, slightly worse on the left. Supra clinoid segments well opacified.  A1 segments, anterior communicating artery, and anterior cerebral arteries are well opacified.  M1 segments are widely patent without hemodynamically significant stenosis or proximal branch occlusion. MCA bifurcations and distal MCA branches within normal limits.  POSTERIOR CIRCULATION:  Left vertebral artery is dominant.  Scattered atherosclerotic plaque present within the distal left vertebral artery without significant stenosis. Posterior inferior cerebral arteries grossly patent. Vertebrobasilar junction and basilar artery are within normal limits. There is mild ectasia of the basilar tip without focal aneurysm. Posterior cerebral arteries are patent bilaterally. Superior cerebellar arteries within normal limits. Proximal anterior inferior cerebral arteries are patent bilaterally.  Atrophy with chronic microvascular ischemic disease again noted. Chronic right cerebellar infarct again noted. No acute intracranial hemorrhage. No abnormal enhancement seen on delayed sequence.  IMPRESSION: 1. No proximal branch occlusion identified within the intracranial circulation. 2. Moderate multi focal atherosclerotic irregularity within the cavernous segments of the internal carotid arteries bilaterally. 3. Mild ectasia of the basilar tip without focal aneurysm. Results were called by telephone at the time of interpretation on 04/13/2014 at 3:01 am to Dr. Dorian Pod , who verbally acknowledged these results.    Electronically Signed   By: Jeannine Boga M.D.   On: 04/13/2014 03:05   Ct Head (brain) Wo Contrast  04/13/2014   CLINICAL DATA:  Code stroke. Left-sided weakness, ataxia and difficulty speaking. Initial encounter.  EXAM: CT HEAD WITHOUT CONTRAST  TECHNIQUE: Contiguous axial images were obtained from the base of the skull through the vertex without intravenous contrast.  COMPARISON:  CT of the head performed 02/12/2013  FINDINGS: There is no evidence of acute infarction, mass lesion, or intra- or extra-axial hemorrhage on CT.  Prominence of the ventricles and sulci reflects moderate cortical volume loss. Diffuse periventricular white matter change likely reflects small vessel ischemic microangiopathy. A chronic infarct is noted at the right cerebellar hemisphere, with associated encephalomalacia. Mild chronic ischemic change is noted at the basal ganglia bilaterally.  The brainstem and fourth ventricle are within normal limits. The cerebral hemispheres demonstrate grossly normal gray-white differentiation. No mass effect or midline shift is seen.  There is no evidence of fracture; visualized osseous structures are unremarkable in appearance. The visualized portions of the orbits are within normal limits. The paranasal sinuses and mastoid air cells are well-aerated. No significant soft tissue abnormalities are seen.  IMPRESSION: 1. No acute intracranial pathology seen on CT. 2. Moderate cortical volume loss and diffuse small vessel ischemic microangiopathy. 3. Chronic infarct at the right cerebellar hemisphere; mild chronic ischemic change at the basal ganglia bilaterally.  These results were called by telephone at the time of interpretation on 04/13/2014 at 1:52 am to Dr. Armida Sans, who verbally acknowledged these results.   Electronically Signed   By: Garald Balding M.D.   On: 04/13/2014 01:52    EKG: Independently reviewed.   Assessment/Plan Principal Problem:   Stroke Active Problems:   Bipolar 1  disorder   Depression   HTN (hypertension)   GERD (gastroesophageal reflux disease)  # Stroke: most likely ischemic stroke. CT-head without intracranial bleeding. Neurology was consulted. Dr. Armida Sans evaluated the patient to suggest stroke work up.   - will Admit to tele bed  - follow dr. Armida Sans recommendation as follows: 1. HgbA1c, fasting lipid panel 2. MRI, MRA  of the brain without contrast 3. Echocardiogram 4. Carotid dopplers 5. Prophylactic therapy-aspirin 6. Risk factor modification 7. Telemetry monitoring 8. Frequent neuro checks 9. PT/OT SLP 10. EEG  # Bipolar 1 disorder: Stable. On Depakote at home - continue home medications   #: HTN: on Prinzide at home  -Will hold Prinzide in the setting of acute stroke.  # GERD: continue PPI  #: depression: stable. Continue Prozac and Remeron  #: Hyperlipidemia: Continue statin and niacin  DVT ppx: SQ Heparin   Code Status: Full code Family Communication:  Yes, patient's wife and daughter at bed side Disposition Plan: Admit to inpatient   Date of Service 04/13/2014    Ivor Costa [facility] Pager [phone]  If 7PM-7AM, please contact night-coverage [email or web address] Password [email or web address] 04/13/2014, 5:02 AM

## 2014-04-13 NOTE — Progress Notes (Signed)
Patient admitted to floor from ED. Patient oriented to room and call bell system. Patient arrived to room with wife and daughter. Rod Holler [phone] (wife) and Gilmore Laroche [phone] (daughter) Patient placed on telemetry, vital signs obtained and patient assessed. Will continue to monitor accordingly.

## 2014-04-13 NOTE — Progress Notes (Addendum)
   [facility]                                                                              Patient Demographics  Jason [name], is a 68 y.o. male, DOB - [birth year], GYI:[id number]  Admit date - 04/13/2014   Admitting Physician Ivor Costa, MD  Outpatient Primary MD for the patient is No primary provider on file.  LOS - 0   Chief Complaint  Patient presents with  . Code Stroke      HPI on 04/13/2014 by Dr. Ivor Costa Jason [name] is a 68 y.o. 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  Patient reported that he was sitting in front of his computer yesterday evening when suddenly developed an odd feeling, then became very sweaty, confused, and lost control of his bladder. When his daughter came home at about 12:30 in the night, he was sitting in chair, shaky and very tired with difficulty speaking. Patient i=was unsure if he lost consciousness. EMS was called and found him with flaccid weakness in the left side, having speech difficulty, and with SBP in the low 80's. Patient has a mild headache. No vision or hearing loss. He does not have fever, chills, chest pain, shortness of breath, nausea, vomiting, abdominal pain, diarrhea.  CT/CTA brain showed no acute intracranial abnormality. Neurology was consulted. Patient was admitted to the inpatient for further evaluation and treatment.   Assessment & Plan   Patient admitted early this morning.  Agree with current assessment and plan by Dr. Ivor Costa.  Acute left-sided hearing paresis with speech impairment/?CVA -Appears to be improving -CT head: No acute intracranial pathology seen on CT. -Pending MRI and MRA of the brain, echocardiogram, carotid Dopplers, lipid panel, hemoglobin A1c -Neurology consulted and appreciated -Continue aspirin -PT, OT, speech therapy consulted for evaluation and  treatment  Hypertension -Will hold home medications at this time allow permissive hypertension  GERD -Continue PPI  Depression/bipolar 1 disorder -Continue Prozac, Remeron, Depakote  Hyperlipidemia -Continue statin and niacin -Fasting lipid panel pending  Hypokalemia -Will replace, and continue to monitor her BMP  Acute kidney injury -Baseline creatinine appears to be approximately 1.1, currently 1.7 -There is a decrease in GFR -Possibly secondary to medications: lisinopril/HCTZ -Will give gentle IVF and continue to monitor Cr  Code Status: Full  Family Communication: None at bedside  Disposition Plan: Admitted, pending further evaluation  Time Spent in minutes   30  minutes  Procedures  None  Consults   Neurology  DVT Prophylaxis  heparin  Jason [name] D.O. on 04/13/2014 at 8:09 AM  Between 7am to 7pm - Pager - [phone]  After 7pm go to [email or web address] - password [email or web address]  And look for the night coverage person covering for me after hours  [facility] Office  [phone]

## 2014-04-13 NOTE — ED Notes (Signed)
CTA head continues, no change, VSS.

## 2014-04-13 NOTE — ED Notes (Signed)
Transporting patient to new room assignment. 

## 2014-04-13 NOTE — Code Documentation (Signed)
68 yo wm brought to [facility] by [facility] after having an episode of starring off into space while working on the computer that resulted in loss of his bladder control.  He admits to having multiple energy drinks tonight & over the last few nights.  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  He has a hx of TIA x3 in 2004.  NIH 5.  See doc flowsheets for code stroke times.  No acute treatment at this time.  Admit to medicine.

## 2014-04-13 NOTE — ED Notes (Signed)
CBG 148.Marland Kitchen RN Notified

## 2014-04-13 NOTE — ED Notes (Signed)
CTA complete

## 2014-04-13 NOTE — Evaluation (Signed)
SLP Cancellation Note  Patient Details Name: Jason [name] MRN: [id number] DOB: [birth year]   Cancelled treatment:       Reason Eval/Treat Not Completed:  (order for speech language eval for 10/11, thank you)   Luanna Salk, [facility] SLP [phone]

## 2014-04-13 NOTE — Progress Notes (Signed)
  Echocardiogram 2D Echocardiogram has been performed.  Roseanna Rainbow R 04/13/2014, 1:32 PM

## 2014-04-13 NOTE — Progress Notes (Signed)
PT Cancellation Note  Patient Details Name: Jason [name] MRN: [id number] DOB: March 25, 1946   Cancelled Treatment:    Reason Eval/Treat Not Completed: Patient not medically ready. Pt on bedrest till 5pm tonight. Will re-attempt to see pt at next available time.    Melina Modena [place], [place] 04/13/2014, 2:28 PM

## 2014-04-13 NOTE — ED Notes (Signed)
Report called to 3W for primary RN.

## 2014-04-13 NOTE — Progress Notes (Signed)
EEG Completed; Results Pending  

## 2014-04-13 NOTE — ED Notes (Signed)
In to CT 3 at this time.

## 2014-04-13 NOTE — ED Notes (Signed)
Back in room D33. Neuro exam/assessment continues. RRT RN & neuro MD Dr. Aram Beecham at [facility] Dr. Sharol Given present. Pt alert, NAD, calm, interactive, participatory, cooperative, follows directions, IVF bolus continues, pending lab results.

## 2014-04-13 NOTE — ED Notes (Signed)
Back to CT1 for angio head. no changes.

## 2014-04-14 DIAGNOSIS — G459 Transient cerebral ischemic attack, unspecified: Principal | ICD-10-CM

## 2014-04-14 DIAGNOSIS — E876 Hypokalemia: Secondary | ICD-10-CM

## 2014-04-14 DIAGNOSIS — N179 Acute kidney failure, unspecified: Secondary | ICD-10-CM

## 2014-04-14 LAB — BASIC METABOLIC PANEL
ANION GAP: 14 (ref 5–15)
BUN: 16 mg/dL (ref 6–23)
CO2: 23 meq/L (ref 19–32)
Calcium: 8.3 mg/dL — ABNORMAL LOW (ref 8.4–10.5)
Chloride: 106 mEq/L (ref 96–112)
Creatinine, Ser: 1.05 mg/dL (ref 0.50–1.35)
GFR calc Af Amer: 83 mL/min — ABNORMAL LOW (ref >90)
GFR calc non Af Amer: 71 mL/min — ABNORMAL LOW (ref >90)
Glucose, Bld: 100 mg/dL — ABNORMAL HIGH (ref 70–99)
Potassium: 3.9 mEq/L (ref 3.7–5.3)
SODIUM: 143 meq/L (ref 137–147)

## 2014-04-14 LAB — LIPID PANEL
Cholesterol: 220 mg/dL — ABNORMAL HIGH (ref 0–200)
HDL: 28 mg/dL — ABNORMAL LOW (ref >39)
LDL CALC: 153 mg/dL — AB (ref 0–99)
TRIGLYCERIDES: 194 mg/dL — AB (ref <150)
Total CHOL/HDL Ratio: 7.9 RATIO
VLDL: 39 mg/dL (ref 0–40)

## 2014-04-14 MED ORDER — ATORVASTATIN CALCIUM 40 MG PO TABS: 40.0000 mg | ORAL_TABLET | Freq: Every day | ORAL | Status: AC

## 2014-04-14 NOTE — Evaluation (Signed)
Physical Therapy Evaluation Patient Details Name: Jason [name] MRN: [id number] DOB: 1946/06/27 Today's Date: 04/14/2014   History of Present Illness  Pt admitted with confusion, slurred speech and low BP, r/o CVA.  Clinical Impression  Pt admitted for testing to r/o stroke. All tests are negative for acute infarct.  Pt is at mod I to supervision level for all mobility and appears to be at baseline.  No further acute care PT or f/u services indicated.  Plan is for d/c home today.  PT signing off.    Follow Up Recommendations No PT follow up    Equipment Recommendations  None recommended by PT    Recommendations for Other Services       Precautions / Restrictions Precautions Precautions: None      Mobility  Bed Mobility Overal bed mobility: Modified Independent                Transfers Overall transfer level: Modified independent Equipment used: None                Ambulation/Gait Ambulation/Gait assistance: Supervision Ambulation Distance (Feet): 200 Feet Assistive device: None Gait Pattern/deviations: Step-through pattern;Decreased stride length Gait velocity: decreased      Stairs            Wheelchair Mobility    Modified Rankin (Stroke Patients Only)       Balance Overall balance assessment: History of Falls Sitting-balance support: No upper extremity supported Sitting balance-[name]: Normal     Standing balance support: No upper extremity supported Standing balance-[name]: Fair                               Pertinent Vitals/Pain Pain Assessment: No/denies pain    Home Living Family/patient expects to be discharged to:: Private residence Living Arrangements: Spouse/significant other Available Help at Discharge: Family;Available 24 hours/day Type of Home: House Home Access: Stairs to enter   [employer] of Steps: 1 Home Layout: Two level Home Equipment: Cane - single point;Grab bars -  tub/shower      Prior Function Level of Independence: Independent with assistive device(s)               Hand Dominance   Dominant Hand: Right    Extremity/Trunk Assessment   Upper Extremity Assessment: Overall WFL for tasks assessed           Lower Extremity Assessment: Overall WFL for tasks assessed      Cervical / Trunk Assessment: Normal  Communication   Communication: No difficulties  Cognition Arousal/Alertness: Awake/alert Behavior During Therapy: WFL for tasks assessed/performed Overall Cognitive Status: Within Functional Limits for tasks assessed                      General Comments      Exercises        Assessment/Plan    PT Assessment Patent does not need any further PT services  PT Diagnosis Difficulty walking   PT Problem List    PT Treatment Interventions     PT Goals (Current goals can be found in the Care Plan section) Acute Rehab PT Goals Patient Stated Goal: home today PT Goal Formulation: No goals set, d/c therapy    Frequency     Barriers to discharge        Co-evaluation               End of Session Equipment Utilized  During Treatment: Gait belt Activity Tolerance: Patient tolerated treatment well Patient left: in bed;with family/visitor present;with call bell/phone within reach Nurse Communication: Mobility status         Time: 4235-3614 PT Time Calculation (min): 25 min   Charges:   PT Evaluation $Initial PT Evaluation Tier I: 1 Procedure PT Treatments $Gait Training: 8-22 mins   PT G Codes:          Lorriane Shire 04/14/2014, 12:39 PM

## 2014-04-14 NOTE — Discharge Summary (Signed)
Physician Discharge Summary  Jason [name] JSE:[id number] DOB: 11-Jan-1946 DOA: 04/13/2014  PCP: Pcp Not In System  Admit date: 04/13/2014 Discharge date: 04/14/2014  Time spent: 45 minutes  Recommendations for Outpatient Follow-up:  Patient will be discharged to home. He is to followup with his primary care physician within one week of discharge. Patient should also follow up Dr. Milta Deiters, neurologist, within one week of discharge. Patient should continue taking his medications as prescribed. He should continue to follow a heart healthy diet. Patient will need a repeat carotid doppler in 6 months.  Discharge Diagnoses:  Acute left-sided hemiparesis with speech impairment/?TIA  Hypertension  GERD  Depression/bipolar 1 disorder  Hyperlipidemia Hypokalemia  Acute kidney injury   Discharge Condition: Stable  Diet recommendation: Heart healthy  Filed Weights   04/13/14 0530  Weight: 84.278 kg (185 lb 12.8 oz)    History of present illness:  on 04/13/2014 by Dr. Ivor Costa  Jason [name] is a 68 y.o. 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 Patient reported that he was sitting in front of his computer yesterday evening when suddenly developed an odd feeling, then became very sweaty, confused, and lost control of his bladder. When his daughter came home at about 12:30 in the night, he was sitting in chair, shaky and very tired with difficulty speaking. Patient i=was unsure if he lost consciousness. EMS was called and found him with flaccid weakness in the left side, having speech difficulty, and with SBP in the low 80's. Patient has a mild headache. No vision or hearing loss. He does not have fever, chills, chest pain, shortness of breath, nausea, vomiting, abdominal pain, diarrhea. CT/CTA brain showed no acute intracranial abnormality. Neurology was consulted. Patient  was admitted to the inpatient for further evaluation and treatment.  Hospital Course:  Acute left-sided hemiparesis with speech impairment/?TIA  -Appears to be improving  -CT head: No acute intracranial pathology seen on CT.  -MRI head: No acute intracranial abnormality. -Echocardiogram: EF 65-70%, no PFO or ASD -Carotid Doppler: Right: 1-39% ICA stenosis. Left: 60-79% internal carotid artery stenosis. Bilateral: Vertebral artery flow is antegrade.   -CTA: internal carotid arteries bilaterally with associated multi focal stenoses of approximately 30-40%, slightly  worse on the left.  -Spoke with Dr. Scot Dock, vascular surgery, via phone, who recommended 9month followup with carotid doppler. -EEG: This is a normal electroencephalogram. No epileptiform activity is noted.  -LDL 153, hemoglobin A1c 5.7 -Neurology consulted and appreciated. Recommended patient followup with Dr.Ahren within one week of discharge.  -Continue aspirin and statin -PT, OT consulted and patient does not require further therapy.  Hypertension  -Will hold home medications at this time allow permissive hypertension   GERD  -Continue PPI   Depression/bipolar 1 disorder  -Continue Prozac, Remeron, Depakote   Hyperlipidemia  -Lipid panel: Total cholesterol 220, triglycerides 194, HDL 20, LDL 153 -Continue niacin and statin however increase the dose of statin.  Hypokalemia  -Resolved  Acute kidney injury  -Resolved -Baseline creatinine appears to be approximately 1.1, was 1.7 upon admission, currently 1.05 -Possibly secondary to medications: lisinopril/HCTZ   Procedures: Echocardiogram: Study Conclusions  - Left ventricle: The cavity size was normal. Wall thickness was increased in a pattern of mild LVH. Systolic function was vigorous. The estimated ejection fraction was in the range of 65% to 70%. Wall motion was normal; there were no regional wall motion abnormalities. The study is not technically  sufficient  to allow evaluation of LV diastolic function. - Aortic valve: Trileaflet; mildly calcified leaflets. There was no significant regurgitation. - Mitral valve: There was trivial regurgitation. - Left atrium: The atrium was mildly dilated. - Right atrium: Central venous pressure (est): 3 mm Hg. - Atrial septum: No defect or patent foramen ovale was identified. - Tricuspid valve: There was physiologic regurgitation. - Pulmonary arteries: Systolic pressure could not be accurately estimated. - Pericardium, extracardiac: There was no pericardial effusion. Impressions: Mild LVH with LVEF 65-70%, indeterminate diastolic function. Mild left atrial enlargement. Unable to assess PASP. No obvious PFO or ASD.  Carotid doppler Right: 1-39% ICA stenosis. Left: 60-79% internal carotid artery stenosis. Bilateral: Vertebral artery flow is antegrade.   EEG This is a normal electroencephalogram. No epileptiform activity is noted.   Consultations: Neurology  Vascular surgery, Dr. Scot Dock, via phone  Discharge Exam: Filed Vitals:   04/14/14 1348  BP: 144/67  Pulse: 95  Temp: 98.1 F (36.7 C)  Resp: 19     General: Well developed, well nourished, NAD, appears stated age  HEENT: NCAT, PERRLA, EOMI, Anicteic Sclera, mucous membranes moist.  Neck: Supple, no JVD, no masses  Cardiovascular: S1 S2 auscultated, no rubs, murmurs or gallops. Regular rate and rhythm.  Respiratory: Clear to auscultation bilaterally with equal chest rise  Abdomen: Soft, nontender, nondistended, + bowel sounds  Extremities: warm dry without cyanosis clubbing or edema  Neuro: AAOx3, cranial nerves grossly intact. Strength 5/5 in patient's upper and lower extremities bilaterally  Skin: Without rashes exudates or nodules  Psych: Normal affect and demeanor with intact judgement and insight  Discharge Instructions     Medication List    STOP taking these medications       simvastatin 80 MG tablet  Commonly known  as:  ZOCOR  Replaced by:  atorvastatin 40 MG tablet      TAKE these medications       aspirin 81 MG EC tablet  Take 1 tablet (81 mg total) by mouth daily.     atorvastatin 40 MG tablet  Commonly known as:  LIPITOR  Take 1 tablet (40 mg total) by mouth daily at 6 PM.     divalproex 250 MG 24 hr tablet  Commonly known as:  DEPAKOTE ER  Take 1,750 mg by mouth every evening.     FLUoxetine 20 MG capsule  Commonly known as:  PROZAC  Take 60 mg by mouth daily.     glucosamine-chondroitin 500-400 MG tablet  Take 1 tablet by mouth 2 (two) times daily.     lisinopril-hydrochlorothiazide 20-12.5 MG per tablet  Commonly known as:  PRINZIDE,ZESTORETIC  Take 1 tablet by mouth daily.     MAGNESIA PO  Take 1 tablet by mouth daily as needed (for headache).     mirtazapine 45 MG tablet  Commonly known as:  REMERON  Take 45 mg by mouth at bedtime.     omeprazole 20 MG capsule  Commonly known as:  PRILOSEC  Take 40 mg by mouth daily.     salsalate 750 MG tablet  Commonly known as:  DISALCID  Take 1,500 mg by mouth 2 (two) times daily.       Allergies  Allergen Reactions  . Penicillins     Unknown reaction   Follow-up Information   Follow up with Primary care physician. Schedule an appointment as soon as possible for a visit in 1 week. [facility] followup)       Follow up with Dr. Milta Deiters.  Schedule an appointment as soon as possible for a visit in 1 week. [facility] followup)    Contact information:   [facility] [place], [place] [address] Tel: [phone]       The results of significant diagnostics from this hospitalization (including imaging, microbiology, ancillary and laboratory) are listed below for reference.    Significant Diagnostic Studies: Ct Angio Head W/cm &/or Wo Cm  04/13/2014   CLINICAL DATA:  Code stroke.  EXAM: CT ANGIOGRAPHY HEAD  TECHNIQUE: Multidetector CT imaging of the head was performed using the  standard protocol during bolus administration of intravenous contrast. Multiplanar CT image reconstructions and MIPs were obtained to evaluate the vascular anatomy.  CONTRAST:  57mL OMNIPAQUE IOHEXOL 350 MG/ML SOLN  COMPARISON:  Prior noncontrast head CT performed earlier on the same day.  FINDINGS: ANTERIOR CIRCULATION:  Visualized distal cervical segments of the internal carotid arteries are widely patent. Petrous segments within normal limits. Irregular calcified atheromatous plaque present throughout the cavernous segments of the internal carotid arteries bilaterally with associated multi focal stenoses of approximately 30-40%, slightly worse on the left. Supra clinoid segments well opacified.  A1 segments, anterior communicating artery, and anterior cerebral arteries are well opacified.  M1 segments are widely patent without hemodynamically significant stenosis or proximal branch occlusion. MCA bifurcations and distal MCA branches within normal limits.  POSTERIOR CIRCULATION:  Left vertebral artery is dominant. Scattered atherosclerotic plaque present within the distal left vertebral artery without significant stenosis. Posterior inferior cerebral arteries grossly patent. Vertebrobasilar junction and basilar artery are within normal limits. There is mild ectasia of the basilar tip without focal aneurysm. Posterior cerebral arteries are patent bilaterally. Superior cerebellar arteries within normal limits. Proximal anterior inferior cerebral arteries are patent bilaterally.  Atrophy with chronic microvascular ischemic disease again noted. Chronic right cerebellar infarct again noted. No acute intracranial hemorrhage. No abnormal enhancement seen on delayed sequence.  IMPRESSION: 1. No proximal branch occlusion identified within the intracranial circulation. 2. Moderate multi focal atherosclerotic irregularity within the cavernous segments of the internal carotid arteries bilaterally. 3. Mild ectasia of the  basilar tip without focal aneurysm. Results were called by telephone at the time of interpretation on 04/13/2014 at 3:01 am to Dr. Dorian Pod , who verbally acknowledged these results.   Electronically Signed   By: Jeannine Boga M.D.   On: 04/13/2014 03:05   Ct Head (brain) Wo Contrast  04/13/2014   CLINICAL DATA:  Code stroke. Left-sided weakness, ataxia and difficulty speaking. Initial encounter.  EXAM: CT HEAD WITHOUT CONTRAST  TECHNIQUE: Contiguous axial images were obtained from the base of the skull through the vertex without intravenous contrast.  COMPARISON:  CT of the head performed 02/12/2013  FINDINGS: There is no evidence of acute infarction, mass lesion, or intra- or extra-axial hemorrhage on CT.  Prominence of the ventricles and sulci reflects moderate cortical volume loss. Diffuse periventricular white matter change likely reflects small vessel ischemic microangiopathy. A chronic infarct is noted at the right cerebellar hemisphere, with associated encephalomalacia. Mild chronic ischemic change is noted at the basal ganglia bilaterally.  The brainstem and fourth ventricle are within normal limits. The cerebral hemispheres demonstrate grossly normal gray-white differentiation. No mass effect or midline shift is seen.  There is no evidence of fracture; visualized osseous structures are unremarkable in appearance. The visualized portions of the orbits are within normal limits. The paranasal sinuses and mastoid air cells are well-aerated. No significant soft tissue abnormalities are seen.  IMPRESSION:  1. No acute intracranial pathology seen on CT. 2. Moderate cortical volume loss and diffuse small vessel ischemic microangiopathy. 3. Chronic infarct at the right cerebellar hemisphere; mild chronic ischemic change at the basal ganglia bilaterally.  These results were called by telephone at the time of interpretation on 04/13/2014 at 1:52 am to Dr. Armida Sans, who verbally acknowledged these  results.   Electronically Signed   By: Garald Balding M.D.   On: 04/13/2014 01:52   Mr Brain Wo Contrast  04/13/2014   CLINICAL DATA:  68 year old male found down with flaccid weakness on the left and abnormal speech. Hypotension with systolic blood pressure of 80 mmHg. Initial encounter.  EXAM: MRI HEAD WITHOUT CONTRAST  TECHNIQUE: Multiplanar, multiecho pulse sequences of the brain and surrounding structures were obtained without intravenous contrast.  COMPARISON:  CTA head 0218 hr the same day. Head CT without contrast 0151 hrs.  FINDINGS: Chronic right PICA cerebellar infarct with encephalomalacia.  No restricted diffusion to suggest acute infarction. No midline shift, mass effect, evidence of mass lesion, ventriculomegaly, extra-axial collection or acute intracranial hemorrhage. Cervicomedullary junction and pituitary are within normal limits. Negative visualized cervical spine. Major intracranial vascular flow voids are preserved.  Widespread patchy cerebral white matter T2 and FLAIR hyperintensity. Summed superimposed perivascular spaces but some areas also most suggestive of chronic white matter lacunar infarcts. Moderate patchy T2 and FLAIR hyperintensity in the pons.  Visible internal auditory structures appear normal. Mastoids are clear. Minor paranasal sinus mucosal thickening. Visualized orbit soft tissues are within normal limits. Visualized scalp soft tissues are within normal limits. Normal bone marrow signal.  IMPRESSION: 1.  No acute intracranial abnormality. 2. Chronic small and medium-sized vessel ischemia.   Electronically Signed   By: Lars Pinks M.D.   On: 04/13/2014 16:28    Microbiology: No results found for this or any previous visit (from the past 240 hour(s)).   Labs: Basic Metabolic Panel:  Recent Labs Lab 04/13/14 0139 04/13/14 0149 04/14/14 0442  NA 143 142 143  K 3.3* 3.1* 3.9  CL 101 103 106  CO2 26  --  23  GLUCOSE 165* 165* 100*  BUN 18 18 16   CREATININE 1.48*  1.70* 1.05  CALCIUM 8.6  --  8.3*   Liver Function Tests:  Recent Labs Lab 04/13/14 0139  AST 37  ALT 45  ALKPHOS 171*  BILITOT <0.2*  PROT 7.4  ALBUMIN 3.7   No results found for this basename: LIPASE, AMYLASE,  in the last 168 hours No results found for this basename: AMMONIA,  in the last 168 hours CBC:  Recent Labs Lab 04/13/14 0139 04/13/14 0149  WBC 10.6*  --   NEUTROABS 7.4  --   HGB 10.8* 12.2*  HCT 33.4* 36.0*  MCV 86.3  --   PLT 302  --    Cardiac Enzymes: No results found for this basename: CKTOTAL, CKMB, CKMBINDEX, TROPONINI,  in the last 168 hours BNP: BNP (last 3 results) No results found for this basename: PROBNP,  in the last 8760 hours CBG:  Recent Labs Lab 04/13/14 0207  GLUCAP 148*    Signed:  Camy Leder  [facility] 04/14/2014, 2:43 PM

## 2014-04-14 NOTE — Discharge Instructions (Signed)
Transient Ischemic Attack  A transient ischemic attack (TIA) is a "warning stroke" that causes stroke-like symptoms. Unlike a stroke, a TIA does not cause permanent damage to the brain. The symptoms of a TIA can happen very fast and do not last long. It is important to know the symptoms of a TIA and what to do. This can help prevent a major stroke or death.  CAUSES   · A TIA is caused by a temporary blockage in an artery in the brain or neck (carotid artery). The blockage does not allow the brain to get the blood supply it needs and can cause different symptoms. The blockage can be caused by either:  ¨ A blood clot.  ¨ Fatty buildup (plaque) in a neck or brain artery.  RISK FACTORS  · High blood pressure (hypertension).  · High cholesterol.  · Diabetes mellitus.  · Heart disease.  · The build up of plaque in the blood vessels (peripheral artery disease or atherosclerosis).  · The build up of plaque in the blood vessels providing blood and oxygen to the brain (carotid artery stenosis).  · An abnormal heart rhythm (atrial fibrillation).  · Obesity.  · Smoking.  · Taking oral contraceptives (especially in combination with smoking).  · Physical inactivity.  · A diet high in fats, salt (sodium), and calories.  · Alcohol use.  · Use of illegal drugs (especially cocaine and methamphetamine).  · Being male.  · Being African American.  · Being over the age of 55.  · Family history of stroke.  · Previous history of blood clots, stroke, TIA, or heart attack.  · Sickle cell disease.  SYMPTOMS   TIA symptoms are the same as a stroke but are temporary. These symptoms usually develop suddenly, or may be newly present upon awakening from sleep:  · Sudden weakness or numbness of the face, arm, or leg, especially on one side of the body.  · Sudden trouble walking or difficulty moving arms or legs.  · Sudden confusion.  · Sudden personality changes.  · Trouble speaking (aphasia) or understanding.  · Difficulty swallowing.  · Sudden  trouble seeing in one or both eyes.  · Double vision.  · Dizziness.  · Loss of balance or coordination.  · Sudden severe headache with no known cause.  · Trouble reading or writing.  · Loss of bowel or bladder control.  · Loss of consciousness.  DIAGNOSIS   Your caregiver may be able to determine the presence or absence of a TIA based on your symptoms, history, and physical exam. Computed tomography (CT scan) of the brain is usually performed to help identify a TIA. Other tests may be done to diagnose a TIA. These tests may include:  · Electrocardiography.  · Continuous heart monitoring.  · Echocardiography.  · Carotid ultrasonography.  · Magnetic resonance imaging (MRI).  · A scan of the brain circulation.  · Blood tests.  PREVENTION   The risk of a TIA can be decreased by appropriately treating high blood pressure, high cholesterol, diabetes, heart disease, and obesity and by quitting smoking, limiting alcohol, and staying physically active.  TREATMENT   Time is of the essence. Since the symptoms of TIA are the same as a stroke, it is important to seek treatment as soon as possible because you may need a medicine to dissolve the clot (thrombolytic) that cannot be given if too much time has passed. Treatment options vary. Treatment options may include rest, oxygen, intravenous (IV) fluids,   and medicines to thin the blood (anticoagulants). Medicines and diet may be used to address diabetes, high blood pressure, and other risk factors. Measures will be taken to prevent short-term and long-term complications, including infection from breathing foreign material into the lungs (aspiration pneumonia), blood clots in the legs, and falls. Treatment options include procedures to either remove plaque in the carotid arteries or dilate carotid arteries that have narrowed due to plaque. Those procedures are:  · Carotid endarterectomy.  · Carotid angioplasty and stenting.  HOME CARE INSTRUCTIONS   · Take all medicines prescribed  by your caregiver. Follow the directions carefully. Medicines may be used to control risk factors for a stroke. Be sure you understand all your medicine instructions.  · You may be told to take aspirin or the anticoagulant warfarin. Warfarin needs to be taken exactly as instructed.  ¨ Taking too much or too little warfarin is dangerous. Too much warfarin increases the risk of bleeding. Too little warfarin continues to allow the risk for blood clots. While taking warfarin, you will need to have regular blood tests to measure your blood clotting time. A PT blood test measures how long it takes for blood to clot. Your PT is used to calculate another value called an INR. Your PT and INR help your caregiver to adjust your dose of warfarin. The dose can change for many reasons. It is critically important that you take warfarin exactly as prescribed.  ¨ Many foods, especially foods high in vitamin K can interfere with warfarin and affect the PT and INR. Foods high in vitamin K include spinach, kale, broccoli, cabbage, collard and turnip greens, brussels sprouts, peas, cauliflower, seaweed, and parsley as well as beef and pork liver, green tea, and soybean oil. You should eat a consistent amount of foods high in vitamin K. Avoid major changes in your diet, or notify your caregiver before changing your diet. Arrange a visit with a dietitian to answer your questions.  ¨ Many medicines can interfere with warfarin and affect the PT and INR. You must tell your caregiver about any and all medicines you take, this includes all vitamins and supplements. Be especially cautious with aspirin and anti-inflammatory medicines. Do not take or discontinue any prescribed or over-the-counter medicine except on the advice of your caregiver or pharmacist.  ¨ Warfarin can have side effects, such as excessive bruising or bleeding. You will need to hold pressure over cuts for longer than usual. Your caregiver or pharmacist will discuss other  potential side effects.  ¨ Avoid sports or activities that may cause injury or bleeding.  ¨ Be mindful when shaving, flossing your teeth, or handling sharp objects.  ¨ Alcohol can change the body's ability to handle warfarin. It is best to avoid alcoholic drinks or consume only very small amounts while taking warfarin. Notify your caregiver if you change your alcohol intake.  ¨ Notify your dentist or other caregivers before procedures.  · Eat a diet that includes 5 or more servings of fruits and vegetables each day. This may reduce the risk of stroke. Certain diets may be prescribed to address high blood pressure, high cholesterol, diabetes, or obesity.  ¨ A low-sodium, low-saturated fat, low-trans fat, low-cholesterol diet is recommended to manage high blood pressure.  ¨ A low-saturated fat, low-trans fat, low-cholesterol, and high-fiber diet may control cholesterol levels.  ¨ A controlled-carbohydrate, controlled-sugar diet is recommended to manage diabetes.  ¨ A reduced-calorie, low-sodium, low-saturated fat, low-trans fat, low-cholesterol diet is recommended to manage obesity.  ·   Maintain a healthy weight.  · Stay physically active. It is recommended that you get at least 30 minutes of activity on most or all days.  · Do not smoke.  · Limit alcohol use even if you are not taking warfarin. Moderate alcohol use is considered to be:  ¨ No more than 2 drinks each day for men.  ¨ No more than 1 drink each day for nonpregnant women.  · Stop drug abuse.  · Home safety. A safe home environment is important to reduce the risk of falls. Your caregiver may arrange for specialists to evaluate your home. Having grab bars in the bedroom and bathroom is often important. Your caregiver may arrange for equipment to be used at home, such as raised toilets and a seat for the shower.  · Follow all instructions for follow-up with your caregiver. This is very important. This includes any referrals and lab tests. Proper follow up can  prevent a stroke or another TIA from occurring.  SEEK MEDICAL CARE IF:  · You have personality changes.  · You have difficulty swallowing.  · You are seeing double.  · You have dizziness.  · You have a fever.  · You have skin breakdown.  SEEK IMMEDIATE MEDICAL CARE IF:   Any of these symptoms may represent a serious problem that is an emergency. Do not wait to see if the symptoms will go away. Get medical help right away. Call your local emergency services (911 in [place] Do not drive yourself to the hospital.  · You have sudden weakness or numbness of the face, arm, or leg, especially on one side of the body.  · You have sudden trouble walking or difficulty moving arms or legs.  · You have sudden confusion.  · You have trouble speaking (aphasia) or understanding.  · You have sudden trouble seeing in one or both eyes.  · You have a loss of balance or coordination.  · You have a sudden, severe headache with no known cause.  · You have new chest pain or an irregular heartbeat.  · You have a partial or total loss of consciousness.  MAKE SURE YOU:   · Understand these instructions.  · Will watch your condition.  · Will get help right away if you are not doing well or get worse.  Document Released: 03/31/2005 Document Revised: 06/26/2013 Document Reviewed: 09/26/2013  [employer]® Patient Information ©2015 [employer]. This information is not intended to replace advice given to you by your health care provider. Make sure you discuss any questions you have with your health care provider.

## 2014-04-14 NOTE — Progress Notes (Signed)
Bilateral carotid artery duplex completed.  Right:  1-39% ICA stenosis.  Left:  60-79% internal carotid artery stenosis.  Bilateral:  Vertebral artery flow is antegrade.

## 2014-04-14 NOTE — Progress Notes (Signed)
STROKE TEAM PROGRESS NOTE   HISTORY  Jason [name] is an 68 y.o. 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  He said that he was sitting in front of his computer when suddenly developed an odd feeling, then became very sweaty, confused, and lost control of his bladder. He is unsure if he lost consciousness. EMS was summoned and find him with flaccid weakness left side, having speech difficulty, and with SBP in the low 80's, but otherwise able to answer questions appropriately.  NIHSS 5.  CT/CTA brain showed no acute intracranial abnormality.  Date last known well: 04/13/14  Time last known well: 12:30 am  tPA Given: no:  NIHSS: 5   SUBJECTIVE (INTERVAL HISTORY) He feels much improved. He had a PMHx of stroke with left-sided weakness. Was working at home Friday in his office when he started feeling bad, it got worse. He noticed he had urinated in his pants but didn't remember urinating. He thinks he blacked out, felt confused, got weak all over, legs felt like dead weight and he had difficulty changing his pants.    OBJECTIVE Temp:  [97.8 F (36.6 C)-98.1 F (36.7 C)] 98.1 F (36.7 C) (10/11 1348) Pulse Rate:  [83-97] 95 (10/11 1348) Cardiac Rhythm:  [-] Normal sinus rhythm (10/11 0800) Resp:  [19-22] 19 (10/11 1348) BP: (139-148)/(49-79) 144/67 mmHg (10/11 1348) SpO2:  [95 %-97 %] 95 % (10/11 1348)   Recent Labs Lab 04/13/14 0207  GLUCAP 148*    Recent Labs Lab 04/13/14 0139 04/13/14 0149 04/14/14 0442  NA 143 142 143  K 3.3* 3.1* 3.9  CL 101 103 106  CO2 26  --  23  GLUCOSE 165* 165* 100*  BUN 18 18 16   CREATININE 1.48* 1.70* 1.05  CALCIUM 8.6  --  8.3*    Recent Labs Lab 04/13/14 0139  AST 37  ALT 45  ALKPHOS 171*  BILITOT <0.2*  PROT 7.4  ALBUMIN 3.7    Recent Labs Lab  04/13/14 0139 04/13/14 0149  WBC 10.6*  --   NEUTROABS 7.4  --   HGB 10.8* 12.2*  HCT 33.4* 36.0*  MCV 86.3  --   PLT 302  --    No results found for this basename: CKTOTAL, CKMB, CKMBINDEX, TROPONINI,  in the last 168 hours  Recent Labs  04/13/14 0139  LABPROT 14.4  INR 1.12   No results found for this basename: COLORURINE, APPERANCEUR, LABSPEC, PHURINE, GLUCOSEU, HGBUR, BILIRUBINUR, KETONESUR, PROTEINUR, UROBILINOGEN, NITRITE, LEUKOCYTESUR,  in the last 72 hours     Component Value Date/Time   CHOL 220* 04/14/2014 0442   TRIG 194* 04/14/2014 0442   HDL 28* 04/14/2014 0442   CHOLHDL 7.9 04/14/2014 0442   VLDL 39 04/14/2014 0442   LDLCALC 153* 04/14/2014 0442   Lab Results  Component Value Date   HGBA1C 5.7* 04/13/2014   No results found for this basename: labopia, cocainscrnur, labbenz, amphetmu, thcu, labbarb    No results found for this basename: ETH,  in the last 168 hours  Ct Angio Head W/cm &/or Wo Cm  04/13/2014   CLINICAL DATA:  Code stroke.  EXAM: CT ANGIOGRAPHY HEAD  TECHNIQUE: Multidetector CT imaging of the head was performed using the standard protocol during bolus administration of intravenous contrast. Multiplanar CT image reconstructions and MIPs were obtained to evaluate the vascular anatomy.  CONTRAST:  15mL OMNIPAQUE IOHEXOL 350 MG/ML SOLN  COMPARISON:  Prior noncontrast head CT performed earlier on the same day.  FINDINGS: ANTERIOR CIRCULATION:  Visualized distal cervical segments of the internal carotid arteries are widely patent. Petrous segments within normal limits. Irregular calcified atheromatous plaque present throughout the cavernous segments of the internal carotid arteries bilaterally with associated multi focal stenoses of approximately 30-40%, slightly worse on the left. Supra clinoid segments well opacified.  A1 segments, anterior communicating artery, and anterior cerebral arteries are well opacified.  M1 segments are widely patent without  hemodynamically significant stenosis or proximal branch occlusion. MCA bifurcations and distal MCA branches within normal limits.  POSTERIOR CIRCULATION:  Left vertebral artery is dominant. Scattered atherosclerotic plaque present within the distal left vertebral artery without significant stenosis. Posterior inferior cerebral arteries grossly patent. Vertebrobasilar junction and basilar artery are within normal limits. There is mild ectasia of the basilar tip without focal aneurysm. Posterior cerebral arteries are patent bilaterally. Superior cerebellar arteries within normal limits. Proximal anterior inferior cerebral arteries are patent bilaterally.  Atrophy with chronic microvascular ischemic disease again noted. Chronic right cerebellar infarct again noted. No acute intracranial hemorrhage. No abnormal enhancement seen on delayed sequence.  IMPRESSION: 1. No proximal branch occlusion identified within the intracranial circulation. 2. Moderate multi focal atherosclerotic irregularity within the cavernous segments of the internal carotid arteries bilaterally. 3. Mild ectasia of the basilar tip without focal aneurysm. Results were called by telephone at the time of interpretation on 04/13/2014 at 3:01 am to Dr. Dorian Pod , who verbally acknowledged these results.   Electronically Signed   By: Jeannine Boga M.D.   On: 04/13/2014 03:05   Ct Head (brain) Wo Contrast  04/13/2014   CLINICAL DATA:  Code stroke. Left-sided weakness, ataxia and difficulty speaking. Initial encounter.  EXAM: CT HEAD WITHOUT CONTRAST  TECHNIQUE: Contiguous axial images were obtained from the base of the skull through the vertex without intravenous contrast.  COMPARISON:  CT of the head performed 02/12/2013  FINDINGS: There is no evidence of acute infarction, mass lesion, or intra- or extra-axial hemorrhage on CT.  Prominence of the ventricles and sulci reflects moderate cortical volume loss. Diffuse periventricular white  matter change likely reflects small vessel ischemic microangiopathy. A chronic infarct is noted at the right cerebellar hemisphere, with associated encephalomalacia. Mild chronic ischemic change is noted at the basal ganglia bilaterally.  The brainstem and fourth ventricle are within normal limits. The cerebral hemispheres demonstrate grossly normal gray-white differentiation. No mass effect or midline shift is seen.  There is no evidence of fracture; visualized osseous structures are unremarkable in appearance. The visualized portions of the orbits are within normal limits. The paranasal sinuses and mastoid air cells are well-aerated. No significant soft tissue abnormalities are seen.  IMPRESSION: 1. No acute intracranial pathology seen on CT. 2. Moderate cortical volume loss and diffuse small vessel ischemic microangiopathy. 3. Chronic infarct at the right cerebellar hemisphere; mild chronic ischemic change at the basal ganglia bilaterally.  These results were called by telephone at the time of interpretation on 04/13/2014 at 1:52 am to Dr. Armida Sans, who verbally acknowledged these results.   Electronically Signed   By: Garald Balding M.D.   On: 04/13/2014 01:52   Mr Brain Wo Contrast  04/13/2014   CLINICAL DATA:  68 year old male found down with flaccid weakness on the left and abnormal speech. Hypotension with systolic blood pressure of 80 mmHg. Initial encounter.  EXAM: MRI HEAD WITHOUT CONTRAST  TECHNIQUE: Multiplanar,  multiecho pulse sequences of the brain and surrounding structures were obtained without intravenous contrast.  COMPARISON:  CTA head 0218 hr the same day. Head CT without contrast 0151 hrs.  FINDINGS: Chronic right PICA cerebellar infarct with encephalomalacia.  No restricted diffusion to suggest acute infarction. No midline shift, mass effect, evidence of mass lesion, ventriculomegaly, extra-axial collection or acute intracranial hemorrhage. Cervicomedullary junction and pituitary are within  normal limits. Negative visualized cervical spine. Major intracranial vascular flow voids are preserved.  Widespread patchy cerebral white matter T2 and FLAIR hyperintensity. Summed superimposed perivascular spaces but some areas also most suggestive of chronic white matter lacunar infarcts. Moderate patchy T2 and FLAIR hyperintensity in the pons.  Visible internal auditory structures appear normal. Mastoids are clear. Minor paranasal sinus mucosal thickening. Visualized orbit soft tissues are within normal limits. Visualized scalp soft tissues are within normal limits. Normal bone marrow signal.  IMPRESSION: 1.  No acute intracranial abnormality. 2. Chronic small and medium-sized vessel ischemia.   Electronically Signed   By: Lars Pinks M.D.   On: 04/13/2014 16:28   Echo: Impressions:  - Mild LVH with LVEF 65-70%, indeterminate diastolic function. Mild left atrial enlargement. Unable to assess PASP. No obvious PFO or ASD.   PHYSICAL EXAM   ASSESSMENT/PLAN  IMPRESSION: Rahiem Schellinger is an 68 y.o. 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 MRI of the brain showed no acute intracranial abnormality. Did see chronic small and medium-sized vessel ischemia. Suspicious for seizure.  Seizure vs TIA   LDL 153, add statin   HgbA1c 5.7  Patient counseled to be compliant with his antithrombotic medications  Risk factor education  Ongoing aggressive risk factor management  Disposition:  Home  Follow up with neurology within the next week.   Should have EEG while inpatient. If inpatient EEG is negative, will repeat in the office this week and if further seizure wkup negative can  consider replacing ASA with plavix.     Hospital day # 1  I have personally examined this patient, reviewed notes, independently viewed  imaging studies, participated in medical decision making and plan of care. I have made any additions or clarifications directly to the above note. Agree with note above.  Georgia Dom, MD  [facility]  Pager: [phone] 04/12/2014 5:16 PM      To contact Stroke Continuity provider, please refer to [email or web address] After hours, contact General Neurology

## 2014-04-23 ENCOUNTER — Ambulatory Visit: Payer: Medicare Other | Admitting: Neurology

## 2014-04-30 ENCOUNTER — Encounter (HOSPITAL_COMMUNITY): Payer: Self-pay | Admitting: Radiology

## 2014-04-30 ENCOUNTER — Emergency Department (HOSPITAL_COMMUNITY): Payer: Medicare Other

## 2014-04-30 ENCOUNTER — Observation Stay (HOSPITAL_COMMUNITY)
Admission: EM | Admit: 2014-04-30 | Discharge: 2014-05-03 | Disposition: A | Discharge status: Home Health | Payer: Medicare Other | Attending: Internal Medicine | Admitting: Internal Medicine

## 2014-04-30 DIAGNOSIS — I639 Cerebral infarction, unspecified: Secondary | ICD-10-CM

## 2014-04-30 DIAGNOSIS — I69354 Hemiplegia and hemiparesis following cerebral infarction affecting left non-dominant side: Secondary | ICD-10-CM | POA: Diagnosis not present

## 2014-04-30 DIAGNOSIS — E785 Hyperlipidemia, unspecified: Secondary | ICD-10-CM | POA: Insufficient documentation

## 2014-04-30 DIAGNOSIS — Z7982 Long term (current) use of aspirin: Secondary | ICD-10-CM | POA: Insufficient documentation

## 2014-04-30 DIAGNOSIS — R471 Dysarthria and anarthria: Secondary | ICD-10-CM | POA: Diagnosis not present

## 2014-04-30 DIAGNOSIS — I9589 Other hypotension: Secondary | ICD-10-CM

## 2014-04-30 DIAGNOSIS — F319 Bipolar disorder, unspecified: Secondary | ICD-10-CM | POA: Diagnosis not present

## 2014-04-30 DIAGNOSIS — R531 Weakness: Secondary | ICD-10-CM

## 2014-04-30 DIAGNOSIS — Z9181 History of falling: Secondary | ICD-10-CM | POA: Insufficient documentation

## 2014-04-30 DIAGNOSIS — G9340 Encephalopathy, unspecified: Secondary | ICD-10-CM

## 2014-04-30 DIAGNOSIS — Z8546 Personal history of malignant neoplasm of prostate: Secondary | ICD-10-CM | POA: Insufficient documentation

## 2014-04-30 DIAGNOSIS — Z79899 Other long term (current) drug therapy: Secondary | ICD-10-CM | POA: Insufficient documentation

## 2014-04-30 DIAGNOSIS — K219 Gastro-esophageal reflux disease without esophagitis: Secondary | ICD-10-CM | POA: Diagnosis not present

## 2014-04-30 DIAGNOSIS — I10 Essential (primary) hypertension: Secondary | ICD-10-CM | POA: Diagnosis not present

## 2014-04-30 DIAGNOSIS — G459 Transient cerebral ischemic attack, unspecified: Secondary | ICD-10-CM

## 2014-04-30 DIAGNOSIS — R627 Adult failure to thrive: Secondary | ICD-10-CM | POA: Insufficient documentation

## 2014-04-30 DIAGNOSIS — I959 Hypotension, unspecified: Secondary | ICD-10-CM | POA: Diagnosis not present

## 2014-04-30 DIAGNOSIS — Z87891 Personal history of nicotine dependence: Secondary | ICD-10-CM | POA: Insufficient documentation

## 2014-04-30 DIAGNOSIS — R4182 Altered mental status, unspecified: Secondary | ICD-10-CM | POA: Diagnosis not present

## 2014-04-30 DIAGNOSIS — N179 Acute kidney failure, unspecified: Secondary | ICD-10-CM | POA: Diagnosis not present

## 2014-04-30 DIAGNOSIS — W19XXXA Unspecified fall, initial encounter: Secondary | ICD-10-CM | POA: Diagnosis present

## 2014-04-30 LAB — COMPREHENSIVE METABOLIC PANEL
ALT: 8 U/L (ref 0–53)
ANION GAP: 14 (ref 5–15)
AST: 15 U/L (ref 0–37)
Albumin: 3.4 g/dL — ABNORMAL LOW (ref 3.5–5.2)
Alkaline Phosphatase: 121 U/L — ABNORMAL HIGH (ref 39–117)
BUN: 22 mg/dL (ref 6–23)
CO2: 27 mEq/L (ref 19–32)
CREATININE: 1.65 mg/dL — AB (ref 0.50–1.35)
Calcium: 9 mg/dL (ref 8.4–10.5)
Chloride: 105 mEq/L (ref 96–112)
GFR calc non Af Amer: 41 mL/min — ABNORMAL LOW (ref >90)
GFR, EST AFRICAN AMERICAN: 48 mL/min — AB (ref >90)
GLUCOSE: 84 mg/dL (ref 70–99)
Potassium: 4.1 mEq/L (ref 3.7–5.3)
Sodium: 146 mEq/L (ref 137–147)
TOTAL PROTEIN: 6.9 g/dL (ref 6.0–8.3)
Total Bilirubin: <0.2 mg/dL — ABNORMAL LOW (ref 0.3–1.2)

## 2014-04-30 LAB — CBC
HCT: 30 % — ABNORMAL LOW (ref 39.0–52.0)
HEMATOCRIT: 31.6 % — AB (ref 39.0–52.0)
HEMOGLOBIN: 9.8 g/dL — AB (ref 13.0–17.0)
Hemoglobin: 9.1 g/dL — ABNORMAL LOW (ref 13.0–17.0)
MCH: 28 pg (ref 26.0–34.0)
MCH: 27.1 pg (ref 26.0–34.0)
MCHC: 31 g/dL (ref 30.0–36.0)
MCHC: 30.3 g/dL (ref 30.0–36.0)
MCV: 90.3 fL (ref 78.0–100.0)
MCV: 89.3 fL (ref 78.0–100.0)
PLATELETS: 157 10*3/uL (ref 150–400)
Platelets: 144 10*3/uL — ABNORMAL LOW (ref 150–400)
RBC: 3.5 MIL/uL — ABNORMAL LOW (ref 4.22–5.81)
RBC: 3.36 MIL/uL — ABNORMAL LOW (ref 4.22–5.81)
RDW: 15.5 % (ref 11.5–15.5)
RDW: 15.8 % — AB (ref 11.5–15.5)
WBC: 7.9 10*3/uL (ref 4.0–10.5)
WBC: 6.8 10*3/uL (ref 4.0–10.5)

## 2014-04-30 LAB — I-STAT CHEM 8, ED
BUN: 23 mg/dL (ref 6–23)
CALCIUM ION: 1.15 mmol/L (ref 1.13–1.30)
CREATININE: 1.9 mg/dL — AB (ref 0.50–1.35)
Chloride: 106 mEq/L (ref 96–112)
Glucose, Bld: 83 mg/dL (ref 70–99)
HCT: 31 % — ABNORMAL LOW (ref 39.0–52.0)
HEMOGLOBIN: 10.5 g/dL — AB (ref 13.0–17.0)
Potassium: 4 mEq/L (ref 3.7–5.3)
SODIUM: 144 meq/L (ref 137–147)
TCO2: 27 mmol/L (ref 0–100)

## 2014-04-30 LAB — PROTIME-INR
INR: 1.08 (ref 0.00–1.49)
Prothrombin Time: 14.2 seconds (ref 11.6–15.2)

## 2014-04-30 LAB — DIFFERENTIAL
Basophils Absolute: 0 10*3/uL (ref 0.0–0.1)
Basophils Relative: 0 % (ref 0–1)
EOS ABS: 0.2 10*3/uL (ref 0.0–0.7)
Eosinophils Relative: 2 % (ref 0–5)
LYMPHS ABS: 1.4 10*3/uL (ref 0.7–4.0)
Lymphocytes Relative: 17 % (ref 12–46)
MONO ABS: 0.8 10*3/uL (ref 0.1–1.0)
MONOS PCT: 10 % (ref 3–12)
NEUTROS PCT: 71 % (ref 43–77)
Neutro Abs: 5.6 10*3/uL (ref 1.7–7.7)

## 2014-04-30 LAB — URINALYSIS, ROUTINE W REFLEX MICROSCOPIC
Bilirubin Urine: NEGATIVE
Glucose, UA: NEGATIVE mg/dL
Hgb urine dipstick: NEGATIVE
KETONES UR: NEGATIVE mg/dL
Leukocytes, UA: NEGATIVE
Nitrite: NEGATIVE
PROTEIN: NEGATIVE mg/dL
Specific Gravity, Urine: 1.018 (ref 1.005–1.030)
UROBILINOGEN UA: 0.2 mg/dL (ref 0.0–1.0)
pH: 5 (ref 5.0–8.0)

## 2014-04-30 LAB — TSH
TSH: 2.3 u[IU]/mL (ref 0.350–4.500)
TSH: 3.55 u[IU]/mL (ref 0.350–4.500)

## 2014-04-30 LAB — CREATININE, SERUM
Creatinine, Ser: 1.35 mg/dL (ref 0.50–1.35)
GFR calc Af Amer: 61 mL/min — ABNORMAL LOW (ref >90)
GFR calc non Af Amer: 53 mL/min — ABNORMAL LOW (ref >90)

## 2014-04-30 LAB — I-STAT TROPONIN, ED: TROPONIN I, POC: 0.01 ng/mL (ref 0.00–0.08)

## 2014-04-30 LAB — I-STAT CG4 LACTIC ACID, ED: LACTIC ACID, VENOUS: 1.09 mmol/L (ref 0.5–2.2)

## 2014-04-30 LAB — VALPROIC ACID LEVEL: Valproic Acid Lvl: 64.6 ug/mL (ref 50.0–100.0)

## 2014-04-30 LAB — AMMONIA: AMMONIA: 42 umol/L (ref 11–60)

## 2014-04-30 LAB — APTT: aPTT: 28 seconds (ref 24–37)

## 2014-04-30 MED ORDER — SODIUM CHLORIDE 0.9 % IV BOLUS (SEPSIS)
1000.0000 mL | Freq: Once | INTRAVENOUS | Status: AC
  Administered 2014-04-30: 1000 mL via INTRAVENOUS

## 2014-04-30 MED ORDER — FLUOXETINE HCL 20 MG PO CAPS
60.0000 mg | ORAL_CAPSULE | Freq: Every day | ORAL | Status: DC
  Administered 2014-04-30 – 2014-05-03 (×4): 60 mg via ORAL
  Filled 2014-04-30 (×4): qty 3

## 2014-04-30 MED ORDER — ENOXAPARIN SODIUM 30 MG/0.3ML ~~LOC~~ SOLN
30.0000 mg | SUBCUTANEOUS | Status: DC
  Administered 2014-04-30 – 2014-05-02 (×3): 30 mg via SUBCUTANEOUS
  Filled 2014-04-30 (×3): qty 0.3

## 2014-04-30 MED ORDER — SALSALATE 750 MG PO TABS
1500.0000 mg | ORAL_TABLET | Freq: Two times a day (BID) | ORAL | Status: DC
  Administered 2014-04-30 – 2014-05-03 (×6): 1500 mg via ORAL
  Filled 2014-04-30 (×7): qty 2

## 2014-04-30 MED ORDER — MIRTAZAPINE 45 MG PO TABS
45.0000 mg | ORAL_TABLET | Freq: Every day | ORAL | Status: DC
  Administered 2014-04-30 – 2014-05-02 (×3): 45 mg via ORAL
  Filled 2014-04-30 (×4): qty 1

## 2014-04-30 MED ORDER — STROKE: EARLY STAGES OF RECOVERY BOOK
Freq: Once | Status: AC
  Administered 2014-04-30
  Filled 2014-04-30: qty 1

## 2014-04-30 MED ORDER — PANTOPRAZOLE SODIUM 40 MG PO TBEC
40.0000 mg | DELAYED_RELEASE_TABLET | Freq: Every day | ORAL | Status: DC
  Administered 2014-04-30 – 2014-05-03 (×4): 40 mg via ORAL
  Filled 2014-04-30 (×4): qty 1

## 2014-04-30 MED ORDER — LORAZEPAM 2 MG/ML IJ SOLN
1.0000 mg | Freq: Once | INTRAMUSCULAR | Status: AC
  Administered 2014-05-01: 1 mg via INTRAVENOUS
  Filled 2014-04-30: qty 1

## 2014-04-30 MED ORDER — GLUCOSAMINE-CHONDROITIN 500-400 MG PO TABS: 1.0000 | ORAL_TABLET | Freq: Two times a day (BID) | ORAL | Status: DC

## 2014-04-30 MED ORDER — ASPIRIN EC 325 MG PO TBEC
325.0000 mg | DELAYED_RELEASE_TABLET | Freq: Every day | ORAL | Status: DC
  Administered 2014-04-30 – 2014-05-03 (×4): 325 mg via ORAL
  Filled 2014-04-30 (×4): qty 1

## 2014-04-30 MED ORDER — SODIUM CHLORIDE 0.9 % IV SOLN
INTRAVENOUS | Status: DC
  Administered 2014-04-30: via INTRAVENOUS

## 2014-04-30 MED ORDER — ACETAMINOPHEN 325 MG PO TABS
650.0000 mg | ORAL_TABLET | ORAL | Status: DC | PRN
  Administered 2014-05-03: 650 mg via ORAL
  Filled 2014-04-30: qty 2

## 2014-04-30 MED ORDER — ATORVASTATIN CALCIUM 40 MG PO TABS
40.0000 mg | ORAL_TABLET | Freq: Every day | ORAL | Status: DC
  Administered 2014-04-30 – 2014-05-02 (×3): 40 mg via ORAL
  Filled 2014-04-30 (×3): qty 1

## 2014-04-30 MED ORDER — DIVALPROEX SODIUM ER 500 MG PO TB24
1750.0000 mg | ORAL_TABLET | Freq: Every evening | ORAL | Status: DC
  Administered 2014-04-30 – 2014-05-02 (×3): 1750 mg via ORAL
  Filled 2014-04-30 (×4): qty 1

## 2014-04-30 NOTE — ED Notes (Signed)
Ordered reg diet tray 

## 2014-04-30 NOTE — ED Notes (Signed)
Pt remains monitored by blood pressure, pulse ox, and 12 lead.  

## 2014-04-30 NOTE — Progress Notes (Signed)
Pt arrived to 4N29, oriented to unit/room, wife at bedside.  Report given to pm RN.

## 2014-04-30 NOTE — ED Notes (Signed)
Wife reports increasing generalized weakness, slurred speech and difficulty walking x1 week. Upon arrival pt noted to have garbled speech, he is also hypotensive and c/o dizziness. He is alert, able to answer questions correctly and follow commands. Denies pain at the time.

## 2014-04-30 NOTE — ED Notes (Signed)
Pt placed on tele monitor and transported to floor

## 2014-04-30 NOTE — H&P (Signed)
History and Physical       Hospital Admission Note Date: 04/30/2014  Patient name: Jason [name] Medical record number: [id number] Date of birth: 07-14-1945 Age: 68 y.o. Gender: male  PCP: Verline Lema, MD    Chief Complaint:  Dysarthria, increased dizziness over the last 3 days  HPI: Patient is a 68 year old male with history of hypertension, prior cerebellar infarct with residual left-sided weakness, history of bipolar disorder, prostate CA who was recently discharged on 10/11 when he had presented with similar symptoms. History was obtained from the patient and his wife at the bedside. Patient reported increasing dizziness, dysarthria, confusion worsening in the last 3 days. Patient had undergone full stroke workup during the previous admission with no acute stroke on MRI, carotid Dopplers had shown 60-79% ICA stenosis on the left, EEG was normal. Patient's wife reported that he followed up with his primary care physician and his blood pressure was noted to be high hence he doubled up on his antihypertensives. Otherwise he denied any nausea, vomiting, diarrhea or poor by mouth intake in the last few days. BP in ED was 73/43. Patient's wife also reported increasing forgetfulness, gait instability, urinary incontinence and had a fall yesterday. No seizures however patient had reported jerking motion of his arms at night.  Neurology was consulted in ED   Review of Systems:  Constitutional: Denies fever, chills, diaphoresis, poor appetite and fatigue.  HEENT: Denies photophobia, eye pain, redness, hearing loss, ear pain, congestion, sore throat, rhinorrhea, sneezing, mouth sores, trouble swallowing, neck pain, neck stiffness and tinnitus.   Respiratory: Denies SOB, DOE, cough, chest tightness,  and wheezing.   Cardiovascular: Denies chest pain, palpitations and leg swelling.  Gastrointestinal: Denies nausea, vomiting, abdominal pain,  diarrhea, constipation, blood in stool and abdominal distention.  Genitourinary: Denies dysuria, urgency, frequency, hematuria, flank pain and difficulty urinating.  Musculoskeletal: Denies myalgias, back pain, joint swelling, arthralgias and gait problem.  Skin: Denies pallor, rash and wound.  Neurological:Please see history of present illness  Hematological: Denies adenopathy. Easy bruising, personal or family bleeding history  Psychiatric/Behavioral: Denies suicidal ideation, mood changes, confusion, nervousness, sleep disturbance and agitation  Past Medical History: Past Medical History  Diagnosis Date  . Hypertension   . Stroke   . Prostate cancer   . Depression   . Bipolar 1 disorder   . Acid reflux disease with ulcer    Past Surgical History  Procedure Laterality Date  . Prostate surgery      Medications: Prior to Admission medications   Medication Sig Start Date End Date Taking? Authorizing Provider  aspirin EC 325 MG tablet Take 325 mg by mouth daily.   Yes Historical Provider, MD  atorvastatin (LIPITOR) 40 MG tablet Take 1 tablet (40 mg total) by mouth daily at 6 PM. 04/14/14  Yes Maryann Mikhail, DO  divalproex (DEPAKOTE ER) 250 MG 24 hr tablet Take 1,750 mg by mouth every evening.   Yes Historical Provider, MD  FLUoxetine (PROZAC) 20 MG capsule Take 60 mg by mouth daily.   Yes Historical Provider, MD  glucosamine-chondroitin 500-400 MG tablet Take 1 tablet by mouth 2 (two) times daily.    Yes Historical Provider, MD  lisinopril-hydrochlorothiazide (PRINZIDE,ZESTORETIC) 20-12.5 MG per tablet Take 1 tablet by mouth daily.   Yes Historical Provider, MD  Magnesium Hydroxide (MAGNESIA PO) Take 1 tablet by mouth daily as needed (for headache).   Yes Historical Provider, MD  mirtazapine (REMERON) 45 MG tablet Take 45 mg by mouth at bedtime.  Yes Historical Provider, MD  omeprazole (PRILOSEC) 20 MG capsule Take 40 mg by mouth daily.   Yes Historical Provider, MD  salsalate  (DISALCID) 750 MG tablet Take 1,500 mg by mouth 2 (two) times daily.   Yes Historical Provider, MD    Allergies:   Allergies  Allergen Reactions  . Penicillins     Unknown reaction    Social History:  reports that he quit smoking about 11 years ago. He does not have any smokeless tobacco history on file. He reports that he does not drink alcohol or use illicit drugs.  Family History: Family History  Problem Relation Age of Onset  . Stroke Mother     Hemorrhagic stroke  . Cancer Father     Pancreatic cancer  . Hypertension Father     Physical Exam: Blood pressure 120/56, pulse 88, temperature 97.8 F (36.6 C), temperature source Oral, resp. rate 18, SpO2 95.00%. General: Alert, awake, oriented x3, in no acute distress, dysarthria + . HEENT: normocephalic, atraumatic, anicteric sclera, pink conjunctiva, pupils equal and reactive to light and accomodation, oropharynx clear Neck: supple, no masses or lymphadenopathy, no goiter, no bruits  Heart: Regular rate and rhythm, without murmurs, rubs or gallops. Lungs: Clear to auscultation bilaterally, no wheezing, rales or rhonchi. Abdomen: Soft, nontender, nondistended, positive bowel sounds, no masses. Extremities: No clubbing, cyanosis or edema with positive pedal pulses. Neuro: Grossly intact, no focal neurological deficits, strength 5/5 upper and lower extremities bilaterally Psych: alert and oriented x 3, normal mood and affect Skin: no rashes or lesions, warm and dry   LABS on Admission:  Basic Metabolic Panel:  Recent Labs Lab 04/30/14 1337 04/30/14 1344  NA 146 144  K 4.1 4.0  CL 105 106  CO2 27  --   GLUCOSE 84 83  BUN 22 23  CREATININE 1.65* 1.90*  CALCIUM 9.0  --    Liver Function Tests:  Recent Labs Lab 04/30/14 1337  AST 15  ALT 8  ALKPHOS 121*  BILITOT <0.2*  PROT 6.9  ALBUMIN 3.4*   No results found for this basename: LIPASE, AMYLASE,  in the last 168 hours No results found for this basename:  AMMONIA,  in the last 168 hours CBC:  Recent Labs Lab 04/30/14 1337 04/30/14 1344  WBC 7.9  --   NEUTROABS 5.6  --   HGB 9.8* 10.5*  HCT 31.6* 31.0*  MCV 90.3  --   PLT 144*  --    Cardiac Enzymes: No results found for this basename: CKTOTAL, CKMB, CKMBINDEX, TROPONINI,  in the last 168 hours BNP: No components found with this basename: POCBNP,  CBG: No results found for this basename: GLUCAP,  in the last 168 hours   Radiological Exams on Admission: Ct Angio Head W/cm &/or Wo Cm  04/13/2014   CLINICAL DATA:  Code stroke.  EXAM: CT ANGIOGRAPHY HEAD  TECHNIQUE: Multidetector CT imaging of the head was performed using the standard protocol during bolus administration of intravenous contrast. Multiplanar CT image reconstructions and MIPs were obtained to evaluate the vascular anatomy.  CONTRAST:  42mL OMNIPAQUE IOHEXOL 350 MG/ML SOLN  COMPARISON:  Prior noncontrast head CT performed earlier on the same day.  FINDINGS: ANTERIOR CIRCULATION:  Visualized distal cervical segments of the internal carotid arteries are widely patent. Petrous segments within normal limits. Irregular calcified atheromatous plaque present throughout the cavernous segments of the internal carotid arteries bilaterally with associated multi focal stenoses of approximately 30-40%, slightly worse on the left. Supra clinoid  segments well opacified.  A1 segments, anterior communicating artery, and anterior cerebral arteries are well opacified.  M1 segments are widely patent without hemodynamically significant stenosis or proximal branch occlusion. MCA bifurcations and distal MCA branches within normal limits.  POSTERIOR CIRCULATION:  Left vertebral artery is dominant. Scattered atherosclerotic plaque present within the distal left vertebral artery without significant stenosis. Posterior inferior cerebral arteries grossly patent. Vertebrobasilar junction and basilar artery are within normal limits. There is mild ectasia of the  basilar tip without focal aneurysm. Posterior cerebral arteries are patent bilaterally. Superior cerebellar arteries within normal limits. Proximal anterior inferior cerebral arteries are patent bilaterally.  Atrophy with chronic microvascular ischemic disease again noted. Chronic right cerebellar infarct again noted. No acute intracranial hemorrhage. No abnormal enhancement seen on delayed sequence.  IMPRESSION: 1. No proximal branch occlusion identified within the intracranial circulation. 2. Moderate multi focal atherosclerotic irregularity within the cavernous segments of the internal carotid arteries bilaterally. 3. Mild ectasia of the basilar tip without focal aneurysm. Results were called by telephone at the time of interpretation on 04/13/2014 at 3:01 am to Dr. Dorian Pod , who verbally acknowledged these results.   Electronically Signed   By: Jeannine Boga M.D.   On: 04/13/2014 03:05   Ct Head (brain) Wo Contrast  04/30/2014   CLINICAL DATA:  Dizziness for 3 days. Left-sided weakness from prior CVA which patient reports is becoming more pronounced. Dysarthria  EXAM: CT HEAD WITHOUT CONTRAST  TECHNIQUE: Contiguous axial images were obtained from the base of the skull through the vertex without intravenous contrast.  COMPARISON:  Head CT and brain MRI April 13, 2014  FINDINGS: There is moderate generalized atrophy, stable. There is no intracranial mass, hemorrhage, extra-axial fluid collection, or midline shift. There is evidence of an old infarct in the posterior inferior cerebellum, stable. There is patchy small vessel disease in the centra semiovale bilaterally, stable. There are small lacunar type infarcts in the anterior limb of each internal capsule. There is no new gray-white compartment lesion. No acute infarct apparent.  The bony calvarium appears intact. The mastoid air cells are clear. There is mild mucosal thickening in the inferior maxillary antra bilaterally.  IMPRESSION:  Atrophy with small vessel disease. Prior infarct in the posterior inferior right cerebellum, stable. Small lacunar infarcts in each internal capsule, stable. No intracranial mass, hemorrhage, or acute appearing infarct. Mild inferior maxillary sinus disease bilaterally.   Electronically Signed   By: Lowella Grip M.D.   On: 04/30/2014 15:16   Ct Head (brain) Wo Contrast  04/13/2014   CLINICAL DATA:  Code stroke. Left-sided weakness, ataxia and difficulty speaking. Initial encounter.  EXAM: CT HEAD WITHOUT CONTRAST  TECHNIQUE: Contiguous axial images were obtained from the base of the skull through the vertex without intravenous contrast.  COMPARISON:  CT of the head performed 02/12/2013  FINDINGS: There is no evidence of acute infarction, mass lesion, or intra- or extra-axial hemorrhage on CT.  Prominence of the ventricles and sulci reflects moderate cortical volume loss. Diffuse periventricular white matter change likely reflects small vessel ischemic microangiopathy. A chronic infarct is noted at the right cerebellar hemisphere, with associated encephalomalacia. Mild chronic ischemic change is noted at the basal ganglia bilaterally.  The brainstem and fourth ventricle are within normal limits. The cerebral hemispheres demonstrate grossly normal gray-white differentiation. No mass effect or midline shift is seen.  There is no evidence of fracture; visualized osseous structures are unremarkable in appearance. The visualized portions of the orbits are within normal  limits. The paranasal sinuses and mastoid air cells are well-aerated. No significant soft tissue abnormalities are seen.  IMPRESSION: 1. No acute intracranial pathology seen on CT. 2. Moderate cortical volume loss and diffuse small vessel ischemic microangiopathy. 3. Chronic infarct at the right cerebellar hemisphere; mild chronic ischemic change at the basal ganglia bilaterally.  These results were called by telephone at the time of interpretation  on 04/13/2014 at 1:52 am to Dr. Armida Sans, who verbally acknowledged these results.   Electronically Signed   By: Garald Balding M.D.   On: 04/13/2014 01:52   Mr Brain Wo Contrast  04/13/2014   CLINICAL DATA:  68 year old male found down with flaccid weakness on the left and abnormal speech. Hypotension with systolic blood pressure of 80 mmHg. Initial encounter.  EXAM: MRI HEAD WITHOUT CONTRAST  TECHNIQUE: Multiplanar, multiecho pulse sequences of the brain and surrounding structures were obtained without intravenous contrast.  COMPARISON:  CTA head 0218 hr the same day. Head CT without contrast 0151 hrs.  FINDINGS: Chronic right PICA cerebellar infarct with encephalomalacia.  No restricted diffusion to suggest acute infarction. No midline shift, mass effect, evidence of mass lesion, ventriculomegaly, extra-axial collection or acute intracranial hemorrhage. Cervicomedullary junction and pituitary are within normal limits. Negative visualized cervical spine. Major intracranial vascular flow voids are preserved.  Widespread patchy cerebral white matter T2 and FLAIR hyperintensity. Summed superimposed perivascular spaces but some areas also most suggestive of chronic white matter lacunar infarcts. Moderate patchy T2 and FLAIR hyperintensity in the pons.  Visible internal auditory structures appear normal. Mastoids are clear. Minor paranasal sinus mucosal thickening. Visualized orbit soft tissues are within normal limits. Visualized scalp soft tissues are within normal limits. Normal bone marrow signal.  IMPRESSION: 1.  No acute intracranial abnormality. 2. Chronic small and medium-sized vessel ischemia.   Electronically Signed   By: Lars Pinks M.D.   On: 04/13/2014 16:28    Assessment/Plan Principal Problem:   Dysarthria with the gait instability; possibly due to TIA versus hypotension, orthostasis versus other causes - Admit to telemetry, patient recently had a 2-D echo, carotid Dopplers done, will repeat MRI,  EEG - Neurology has been consulted will follow medications - Continue aspirin, statin - PT, OT evaluation, orthostatics  - Obtain TSH, B12, folate, B1 levels   Active Problems:   Bipolar 1 disorder - Continue outpatient psychiatric medications  Hypotension with history of  HTN (hypertension) - Hold all antihypertensives, placed on gentle hydration    AKI (acute kidney injury) with Hypotension; possibly due to increasing his doses of antihypertensives - Hold lisinopril, HCTZ, place on gentle hydration     Fall - PT, OT EVALUATION   DVT prophylaxis: Lovenox   CODE STATUS: Full code   Family Communication: Admission, patients condition and plan of care including tests being ordered have been discussed with the patient and  Wife who indicates understanding and agree with the plan and Code Status   Further plan will depend as patient's clinical course evolves and further radiologic and laboratory data become available.   Time Spent on Admission: 77mins  RAI,RIPUDEEP M.D. [facility] 04/30/2014, 5:06 PM Pager: [phone]  If 7PM-7AM, please contact night-coverage [email or web address] Password [email or web address]

## 2014-04-30 NOTE — Consult Note (Signed)
Neurology Consultation Reason for Consult: Slurred speech Referring Physician: Rai, R  CC: Altered mental status  History is obtained from: Patient, wife  HPI: Braiden Rodman is a 68 y.o. male with a history of previous stroke causing left-sided weakness who presents with worsening gait, altered mental status. His wife states that over the past few weeks, he has been progressively going downhill experiencing memory problems. She states that he was very resistant to coming into the ED today, but after getting into the car he did not remember where they were going.  He has had one episode of possible loss of consciousness that occurred 3 weeks ago in which she was admitted and had a complete stroke workup  Of note, he states that he feels a "cold coming on."  He also describes intermittent jerks of his arms bilaterally. He has dropped sodas because of this.   He describes episodes of "zoning out" during which his wife describes him to lean his head back as a falling asleep. He is responsive when she goes to him, but may appear groggy for a few minutes. He has not had another clear episode of loss of consciousness since his episode 3 weeks ago.  LKW: Several weeks ago tpa given?: no, onset of window    ROS: A 14 point ROS was performed and is negative except as noted in the HPI.   Past Medical History  Diagnosis Date  . Hypertension   . Stroke   . Prostate cancer   . Depression   . Bipolar 1 disorder   . Acid reflux disease with ulcer     Family History: MOther - stroke  Social History: Tob: former smoker  Exam: Current vital signs: BP 120/56  Pulse 88  Temp(Src) 97.8 F (36.6 C) (Oral)  Resp 18  SpO2 95% Vital signs in last 24 hours: Temp:  [97.8 F (36.6 C)] 97.8 F (36.6 C) (10/27 1358) Pulse Rate:  [74-93] 88 (10/27 1555) Resp:  [16-18] 18 (10/27 1555) BP: (73-120)/(39-77) 120/56 mmHg (10/27 1555) SpO2:  [92 %-96 %] 95 % (10/27 1555)  General: in bed,  NAD CV: RRR Mental Status: Patient is awake, alert, oriented to person, place, month, year, and situation. Patient is able to give a clear and coherent history. No signs of aphasia or neglect He does have some dysarthria.  Cranial Nerves: II: Visual Fields are full. Pupils are equal, round, and reactive to light.  Discs are difficult to visualize. III,IV, VI: EOMI without ptosis or diploplia.  V: Facial sensation is symmetric to temperature VII: Facial movement is symmetric.  VIII: hearing is intact to voice X: Uvula elevates symmetrically XI: Shoulder shrug is symmetric. XII: tongue is midline without atrophy or fasciculations.  Motor: Tone is normal. Bulk is normal. 5/5 strength was present in right arm and leg, as well as left leg. His left arm has mild 4+/5 weakness.  Sensory: Sensation is symmetric to light touch and temperature in the arms and legs. Deep Tendon Reflexes: 2+ and symmetric in the biceps and patellae.  Cerebellar: FNF intact on right, mild difficulty on left.  Gait: Not tested due to multiple medical monitors in the ED setting.        I have reviewed labs in epic and the results pertinent to this consultation are: Elevated creatinine  I have reviewed the images obtained: CT head-previous infarcts, no acute findings  Impression: 68 year old male with a history of a previous pica infarct who presents with worsening gait (bilateral leg  weakness), confusion in the setting of hypotension. His description of myoclonus does raise the possibility of a toxic/metabolic etiology and some possibilities would include a hyperammonemic state from Depakote, decreased clearance of his high dose of mirtazapine with kidney disease, thyroid disease. I would not typically expect his level of renal insufficiency alone causes altered mental status.  Also possible with respect to his dysarthria would be "peeling of the onion" which is unmasking of previous stroke deficits in the  setting of physiological stressor. He does complain of sore throat and he has had a history of delirium with infection in the past(when he had pneumonia).  Intermittent seizures has been a consideration in him, but I am not convinced by history alone that these are seizures.  Finally, cerebral hypoperfusion due to hypotension can certainly cause people to have the feeling of bilateral lower extremity weakness and this would be my final consideration. He has certainly been very hypotensive on 2 episodes at least the past 3 weeks.  Recommendations: 1) MRI brain 2) EEG, routine study 3) orthostatic vital signs 4) ammonia, TSH, B12   Roland Rack, MD [facility] [phone]  If 7pm- 7am, please page neurology on call as listed in [place].

## 2014-04-30 NOTE — ED Provider Notes (Signed)
TIME SEEN: 13:30  CHIEF COMPLAINT: altered mental status, lightheaded, dysarthria  HPI: Pt is a 68 y.o. M with history of hypertension, prior stroke was recently admitted to the hospital for a recent stroke and hypertension who returned to the emergency department with three weeks of intermittent lightheadedness, altered mental status and dysarthria. Wife reports it has been worse over the past three days. He denies that he has had any head injury or headache. No new numbness or focal weakness. He is markedly dysarthric on exam. He is also hypotensive. He reports he has been eating and drinking well. No chest pain or shortness of breath. No vomiting or diarrhea. No bloody solar melena.  ROS: See HPI Constitutional: no fever  Eyes: no drainage  ENT: no runny nose   Cardiovascular:  no chest pain  Resp: no SOB  GI: no vomiting GU: no dysuria Integumentary: no rash  Allergy: no hives  Musculoskeletal: no leg swelling  Neurological: slurred speech ROS otherwise negative  PAST MEDICAL HISTORY/PAST SURGICAL HISTORY:  Past Medical History  Diagnosis Date  . Hypertension   . Stroke   . Prostate cancer   . Depression   . Bipolar 1 disorder   . Acid reflux disease with ulcer     MEDICATIONS:  Prior to Admission medications   Medication Sig Start Date End Date Taking? Authorizing Provider  aspirin EC 325 MG tablet Take 325 mg by mouth daily.   Yes Historical Provider, MD  atorvastatin (LIPITOR) 40 MG tablet Take 1 tablet (40 mg total) by mouth daily at 6 PM. 04/14/14  Yes Maryann Mikhail, DO  divalproex (DEPAKOTE ER) 250 MG 24 hr tablet Take 1,750 mg by mouth every evening.   Yes Historical Provider, MD  FLUoxetine (PROZAC) 20 MG capsule Take 60 mg by mouth daily.   Yes Historical Provider, MD  glucosamine-chondroitin 500-400 MG tablet Take 1 tablet by mouth 2 (two) times daily.    Yes Historical Provider, MD  lisinopril-hydrochlorothiazide (PRINZIDE,ZESTORETIC) 20-12.5 MG per tablet Take  1 tablet by mouth daily.   Yes Historical Provider, MD  Magnesium Hydroxide (MAGNESIA PO) Take 1 tablet by mouth daily as needed (for headache).   Yes Historical Provider, MD  mirtazapine (REMERON) 45 MG tablet Take 45 mg by mouth at bedtime.   Yes Historical Provider, MD  omeprazole (PRILOSEC) 20 MG capsule Take 40 mg by mouth daily.   Yes Historical Provider, MD  salsalate (DISALCID) 750 MG tablet Take 1,500 mg by mouth 2 (two) times daily.   Yes Historical Provider, MD    ALLERGIES:  Allergies  Allergen Reactions  . Penicillins     Unknown reaction    SOCIAL HISTORY:  History  Substance Use Topics  . Smoking status: Former Smoker    Quit date: 03/06/2003  . Smokeless tobacco: Not on file  . Alcohol Use: No    FAMILY HISTORY: Family History  Problem Relation Age of Onset  . Stroke Mother     Hemorrhagic stroke  . Cancer Father     Pancreatic cancer  . Hypertension Father     EXAM: BP 120/56  Pulse 88  Temp(Src) 97.8 F (36.6 C) (Oral)  Resp 18  SpO2 95% CONSTITUTIONAL: Alert and oriented and responds appropriately to questions. Well-appearing; well-nourished HEAD: Normocephalic EYES: Conjunctivae clear, PERRL ENT: normal nose; no rhinorrhea; moist mucous membranes; pharynx without lesions noted NECK: Supple, no meningismus, no LAD  CARD: RRR; S1 and S2 appreciated; no murmurs, no clicks, no rubs, no gallops RESP: Normal  chest excursion without splinting or tachypnea; breath sounds clear and equal bilaterally; no wheezes, no rhonchi, no rales,  ABD/GI: Normal bowel sounds; non-distended; soft, non-tender, no rebound, no guarding BACK:  The back appears normal and is non-tender to palpation, there is no CVA tenderness EXT: Normal ROM in all joints; non-tender to palpation; no edema; normal capillary refill; no cyanosis    SKIN: Normal color for age and race; warm NEURO: Moves all extremities equally; strength is 5/5 in all four extremities and he reports normal  sensation diffusely, patient is dysarthric, no aphasia, cranial nerve two through 12 intact PSYCH: The patient's mood and manner are appropriate. Grooming and personal hygiene are appropriate.  MEDICAL DECISION MAKING:  Patient here with dysarthria and hypertension. He was recently admitted for a stroke. Last seen normal was three days ago. He is outside of any TPA window. Unclear etiology for patients hypotension. Will obtain labs, head CT and give IV fluids.  He denies any chest pain or shortness of breath. Denies any infectious symptoms. Denies any vomiting or diarrhea. Denies any blood loss. Patient will need admission.  ED PROGRESS: Patient's labs show mild elevation of his creatinine. Otherwise his labs. Their baseline. His blood pressure is improving with IV fluids. Head CT shows no acute abnormality. Discussed with Dr. Janann Colonel with neuro hospitalist service who recommend admission, MRI. Discussed with hospitalist for admission to the hospital. Patient family updated with plan.     EKG Interpretation  Date/Time:  Tuesday April 30 2014 13:07:32 EDT Ventricular Rate:  95 PR Interval:  176 QRS Duration: 82 QT Interval:  346 QTC Calculation: 434 R Axis:   88 Text Interpretation:  Normal sinus rhythm Normal ECG ED PHYSICIAN INTERPRETATION AVAILABLE IN [facility] Confirmed by TEST, Record ([id number]) on 05/02/2014 8:28:31 AM        CRITICAL CARE Performed by: Nyra Jabs   Total critical care time: 45 minutes  Critical care time was exclusive of separately billable procedures and treating other patients.  Critical care was necessary to treat or prevent imminent or life-threatening deterioration.  Critical care was time spent personally by me on the following activities: development of treatment plan with patient and/or surrogate as well as nursing, discussions with consultants, evaluation of patient's response to treatment, examination of patient, obtaining history from  patient or surrogate, ordering and performing treatments and interventions, ordering and review of laboratory studies, ordering and review of radiographic studies, pulse oximetry and re-evaluation of patient's condition.    [place], DO 05/02/14 1255

## 2014-04-30 NOTE — ED Notes (Signed)
Pt placed into gown and on monitor upon arrival to room. Pt monitored by blood pressure, pulse ox, and 12 lead. Pts family remains at bedside.

## 2014-04-30 NOTE — ED Notes (Signed)
Pt reports increased dizziness x 3 days. Pt has hx of CVA with residual left sided weakness, but pt states it has been progressively worsening over last several days. Pt speech noted to have dysarthria in triage, unsure if this is new. Pt poor historian.

## 2014-04-30 NOTE — ED Notes (Signed)
Pt remains monitored by blood pressure, pulse ox, and 12 lead. Pts family remains at bedside.  

## 2014-04-30 NOTE — ED Notes (Signed)
Attempted to call report x 1  

## 2014-05-01 ENCOUNTER — Encounter (HOSPITAL_COMMUNITY): Payer: Self-pay | Admitting: General Practice

## 2014-05-01 ENCOUNTER — Inpatient Hospital Stay (HOSPITAL_COMMUNITY): Payer: Medicare Other

## 2014-05-01 DIAGNOSIS — R471 Dysarthria and anarthria: Secondary | ICD-10-CM | POA: Diagnosis not present

## 2014-05-01 DIAGNOSIS — I639 Cerebral infarction, unspecified: Secondary | ICD-10-CM

## 2014-05-01 DIAGNOSIS — I9589 Other hypotension: Secondary | ICD-10-CM | POA: Diagnosis not present

## 2014-05-01 DIAGNOSIS — F319 Bipolar disorder, unspecified: Secondary | ICD-10-CM

## 2014-05-01 LAB — URINALYSIS, ROUTINE W REFLEX MICROSCOPIC
Bilirubin Urine: NEGATIVE
GLUCOSE, UA: NEGATIVE mg/dL
Hgb urine dipstick: NEGATIVE
Ketones, ur: NEGATIVE mg/dL
LEUKOCYTES UA: NEGATIVE
Nitrite: NEGATIVE
Protein, ur: NEGATIVE mg/dL
Specific Gravity, Urine: 1.015 (ref 1.005–1.030)
Urobilinogen, UA: 0.2 mg/dL (ref 0.0–1.0)
pH: 5.5 (ref 5.0–8.0)

## 2014-05-01 LAB — LIPID PANEL
CHOLESTEROL: 162 mg/dL (ref 0–200)
HDL: 26 mg/dL — ABNORMAL LOW (ref >39)
LDL Cholesterol: 101 mg/dL — ABNORMAL HIGH (ref 0–99)
Total CHOL/HDL Ratio: 6.2 RATIO
Triglycerides: 177 mg/dL — ABNORMAL HIGH (ref <150)
VLDL: 35 mg/dL (ref 0–40)

## 2014-05-01 LAB — RAPID URINE DRUG SCREEN, HOSP PERFORMED
AMPHETAMINES: NOT DETECTED
BENZODIAZEPINES: NOT DETECTED
Barbiturates: NOT DETECTED
Cocaine: NOT DETECTED
OPIATES: POSITIVE — AB
Tetrahydrocannabinol: NOT DETECTED

## 2014-05-01 LAB — HEMOGLOBIN A1C
HEMOGLOBIN A1C: 5.6 % (ref <5.7)
Mean Plasma Glucose: 114 mg/dL (ref <117)

## 2014-05-01 LAB — FOLATE: FOLATE: 14.9 ng/mL

## 2014-05-01 LAB — GLUCOSE, CAPILLARY: GLUCOSE-CAPILLARY: 79 mg/dL (ref 70–99)

## 2014-05-01 LAB — VITAMIN B12
Vitamin B-12: 1236 pg/mL — ABNORMAL HIGH (ref 211–911)
Vitamin B-12: 1137 pg/mL — ABNORMAL HIGH (ref 211–911)

## 2014-05-01 NOTE — Progress Notes (Signed)
Subjective:  Patient continues to feel he has slurred speech and some generalized weakness. Otherwise he he feels back to his baseline.  Objective: Current vital signs: BP 132/69  Pulse 83  Temp(Src) 98.1 F (36.7 C) (Oral)  Resp 20  Ht 5\' 6"  (1.676 m)  Wt 87.998 kg (194 lb)  BMI 31.33 kg/m2  SpO2 95% Vital signs in last 24 hours: Temp:  [97 F (36.1 C)-98.4 F (36.9 C)] 98.1 F (36.7 C) (10/28 0630) Pulse Rate:  [60-93] 83 (10/28 0630) Resp:  [16-20] 20 (10/28 0630) BP: (73-134)/(39-86) 132/69 mmHg (10/28 0630) SpO2:  [92 %-96 %] 95 % (10/28 0630) Weight:  [87.998 kg (194 lb)] 87.998 kg (194 lb) (10/27 2031)  Intake/Output from previous day: 10/27 0701 - 10/28 0700 In: 2000 [I.V.:2000] Out: 150 [Urine:150] Intake/Output this shift:   Nutritional status: Cardiac  Neurologic Exam: General: Mental Status: Alert, oriented, thought content appropriate.  Speech slightly slurred without evidence of aphasia.  Able to follow 3 step commands without difficulty. Cranial Nerves: II: Discs flat bilaterally; Visual fields grossly normal, pupils equal, round, reactive to light and accommodation III,IV, VI: ptosis not present, extra-ocular motions intact bilaterally V,VII: smile symmetric, facial light touch sensation normal bilaterally VIII: hearing normal bilaterally IX,X: gag reflex present XI: bilateral shoulder shrug XII: midline tongue extension without atrophy or fasciculations  Motor: Right : Upper extremity   5/5    Left:     Upper extremity   5/5  Lower extremity   5/5     Lower extremity   5/5 Tone and bulk:normal tone throughout; no atrophy noted Sensory: Pinprick and light touch intact throughout, bilaterally Deep Tendon Reflexes:  Right: Upper Extremity   Left: Upper extremity   biceps (C-5 to C-6) 2/4   biceps (C-5 to C-6) 2/4 tricep (C7) 2/4    triceps (C7) 2/4 Brachioradialis (C6) 2/4  Brachioradialis (C6) 2/4  Lower Extremity Lower Extremity  quadriceps  (L-2 to L-4) 2/4   quadriceps (L-2 to L-4) 2/4 Achilles (S1) 1/4   Achilles (S1) 1/4  Plantars: Right: downgoing   Left: downgoing Cerebellar: normal finger-to-nose,  normal heel-to-shin test    Lab Results: Basic Metabolic Panel:  Recent Labs Lab 04/30/14 1337 04/30/14 1344 04/30/14 2142  NA 146 144  --   K 4.1 4.0  --   CL 105 106  --   CO2 27  --   --   GLUCOSE 84 83  --   BUN 22 23  --   CREATININE 1.65* 1.90* 1.35  CALCIUM 9.0  --   --     Liver Function Tests:  Recent Labs Lab 04/30/14 1337  AST 15  ALT 8  ALKPHOS 121*  BILITOT <0.2*  PROT 6.9  ALBUMIN 3.4*   No results found for this basename: LIPASE, AMYLASE,  in the last 168 hours  Recent Labs Lab 04/30/14 1738  AMMONIA 42    CBC:  Recent Labs Lab 04/30/14 1337 04/30/14 1344 04/30/14 2142  WBC 7.9  --  6.8  NEUTROABS 5.6  --   --   HGB 9.8* 10.5* 9.1*  HCT 31.6* 31.0* 30.0*  MCV 90.3  --  89.3  PLT 144*  --  157    Cardiac Enzymes: No results found for this basename: CKTOTAL, CKMB, CKMBINDEX, TROPONINI,  in the last 168 hours  Lipid Panel:  Recent Labs Lab 05/01/14 0700  CHOL 162  TRIG 177*  HDL 26*  CHOLHDL 6.2  VLDL 35  LDLCALC 101*  CBG: No results found for this basename: GLUCAP,  in the last 168 hours  Microbiology: Results for orders placed during the hospital encounter of 11/19/12  GC/CHLAMYDIA PROBE AMP     Status: None   Collection Time    11/19/12  6:38 PM      Result Value Ref Range Status   CT Probe RNA NEGATIVE  NEGATIVE Final   GC Probe RNA NEGATIVE  NEGATIVE Final   Comment: (NOTE)                                                                                              Normal Reference Range: Negative          Assay performed using the Gen-Probe APTIMA COMBO2 (R) Assay.     Acceptable specimen types for this assay include APTIMA Swabs (Unisex,     endocervical, urethral, or vaginal), first void urine, and ThinPrep     liquid based cytology  samples.    Coagulation Studies:  Recent Labs  04/30/14 1337  LABPROT 14.2  INR 1.08    Imaging: Ct Head (brain) Wo Contrast  04/30/2014   CLINICAL DATA:  Dizziness for 3 days. Left-sided weakness from prior CVA which patient reports is becoming more pronounced. Dysarthria  EXAM: CT HEAD WITHOUT CONTRAST  TECHNIQUE: Contiguous axial images were obtained from the base of the skull through the vertex without intravenous contrast.  COMPARISON:  Head CT and brain MRI April 13, 2014  FINDINGS: There is moderate generalized atrophy, stable. There is no intracranial mass, hemorrhage, extra-axial fluid collection, or midline shift. There is evidence of an old infarct in the posterior inferior cerebellum, stable. There is patchy small vessel disease in the centra semiovale bilaterally, stable. There are small lacunar type infarcts in the anterior limb of each internal capsule. There is no new gray-white compartment lesion. No acute infarct apparent.  The bony calvarium appears intact. The mastoid air cells are clear. There is mild mucosal thickening in the inferior maxillary antra bilaterally.  IMPRESSION: Atrophy with small vessel disease. Prior infarct in the posterior inferior right cerebellum, stable. Small lacunar infarcts in each internal capsule, stable. No intracranial mass, hemorrhage, or acute appearing infarct. Mild inferior maxillary sinus disease bilaterally.   Electronically Signed   By: Lowella Grip M.D.   On: 04/30/2014 15:16    Medications:  Scheduled: . aspirin EC  325 mg Oral Daily  . atorvastatin  40 mg Oral q1800  . divalproex  1,750 mg Oral QPM  . enoxaparin (LOVENOX) injection  30 mg Subcutaneous Q24H  . FLUoxetine  60 mg Oral Daily  . LORazepam  1 mg Intravenous Once  . mirtazapine  45 mg Oral QHS  . pantoprazole  40 mg Oral Daily  . salsalate  1,500 mg Oral BID    Assessment/Plan:  68 YO male with confusion and hypotension. Currently confusion has resolved but  slurred speeches is still present.  CT head shows no acute infarct, TSH, B12, VPA and ammonia within normal limits. Orthostatics are normal. Blood pressures have been normalized. EEG has been done and awaiting reading.  Recommend: 1) MRI  brain 2) EEG--done --awaiting reading  Will continue to follow      Etta Quill PA-C [facility] [phone]  05/01/2014, 10:39 AM

## 2014-05-01 NOTE — Progress Notes (Signed)
EEG completed, results pending. 

## 2014-05-01 NOTE — Progress Notes (Signed)
[facility] PROGRESS NOTE  Jason [name] JEH:[id number] DOB: 01/29/1946 DOA: 04/30/2014 PCP: Verline Lema, MD  Assessment/Plan: 1. Acute encephalopathy. -Patient having a steep functional decline/failure to thrive, and becoming increasing confused. -Continues to have dysarthria and generalized weakness. -Pending MRI of brain, initial CT scan unrevealing. -EEG unremarkable -Occupational therapy and physical therapy recommending home health services. -Continue aspirin 325 mg by mouth daily and statin therapy  2. Hypertension. -Antihypertensive agents health, last blood pressure 137/69.  3.  Dyslipidemia -Continue Lipitor 40 mg by mouth daily  4.  Bipolar disorder -Continue Depakote  Code Status: Full code Family Communication:  Disposition Plan: Physical therapy and occupational therapy recommending home health services   Consultants:  Neurology   HPI/Subjective: Patient is a pleasant 68 year old gentleman with a past medical history hypertension, history of cerebellar infarct with left-sided residual weakness, admitted to the medicine service on 04/30/2014 presented with acute encephalopathy, failure to thrive and functional decline. CT scan of brain without contrast performed on admission showed prior infarct in the posterior inferior right cerebellum which was stable. No new infarct was noted. Patient was seen and evaluated by neurology. He remains stable, currently pending MRI of the brain.  Objective: Filed Vitals:   05/01/14 1411  BP: 137/69  Pulse: 85  Temp: 98.5 F (36.9 C)  Resp: 18    Intake/Output Summary (Last 24 hours) at 05/01/14 1710 Last data filed at 05/01/14 1046  Gross per 24 hour  Intake      0 ml  Output    450 ml  Net   -450 ml   Filed Weights   04/30/14 2031  Weight: 87.998 kg (194 lb)    Exam:   General:  Patient is in no acute distress, awake, following commands  Cardiovascular: Regular rate and rhythm normal  S1-S2  Respiratory: Clear to auscultation bilaterally  Abdomen: Soft nontender nondistended positive bowel sounds  Musculoskeletal: Preserved range of motion 12 extremities  Neurological. He has 4-5 muscle strength to left upper and left lower extremities, 5 of 50 strength to right upper and lower extremities, sensation was intact  Data Reviewed: Basic Metabolic Panel:  Recent Labs Lab 04/30/14 1337 04/30/14 1344 04/30/14 2142  NA 146 144  --   K 4.1 4.0  --   CL 105 106  --   CO2 27  --   --   GLUCOSE 84 83  --   BUN 22 23  --   CREATININE 1.65* 1.90* 1.35  CALCIUM 9.0  --   --    Liver Function Tests:  Recent Labs Lab 04/30/14 1337  AST 15  ALT 8  ALKPHOS 121*  BILITOT <0.2*  PROT 6.9  ALBUMIN 3.4*   No results found for this basename: LIPASE, AMYLASE,  in the last 168 hours  Recent Labs Lab 04/30/14 1738  AMMONIA 42   CBC:  Recent Labs Lab 04/30/14 1337 04/30/14 1344 04/30/14 2142  WBC 7.9  --  6.8  NEUTROABS 5.6  --   --   HGB 9.8* 10.5* 9.1*  HCT 31.6* 31.0* 30.0*  MCV 90.3  --  89.3  PLT 144*  --  157   Cardiac Enzymes: No results found for this basename: CKTOTAL, CKMB, CKMBINDEX, TROPONINI,  in the last 168 hours BNP (last 3 results) No results found for this basename: PROBNP,  in the last 8760 hours CBG: No results found for this basename: GLUCAP,  in the last 168 hours  No results found for this or any  previous visit (from the past 240 hour(s)).   Studies: Ct Head (brain) Wo Contrast  04/30/2014   CLINICAL DATA:  Dizziness for 3 days. Left-sided weakness from prior CVA which patient reports is becoming more pronounced. Dysarthria  EXAM: CT HEAD WITHOUT CONTRAST  TECHNIQUE: Contiguous axial images were obtained from the base of the skull through the vertex without intravenous contrast.  COMPARISON:  Head CT and brain MRI April 13, 2014  FINDINGS: There is moderate generalized atrophy, stable. There is no intracranial mass, hemorrhage,  extra-axial fluid collection, or midline shift. There is evidence of an old infarct in the posterior inferior cerebellum, stable. There is patchy small vessel disease in the centra semiovale bilaterally, stable. There are small lacunar type infarcts in the anterior limb of each internal capsule. There is no new gray-white compartment lesion. No acute infarct apparent.  The bony calvarium appears intact. The mastoid air cells are clear. There is mild mucosal thickening in the inferior maxillary antra bilaterally.  IMPRESSION: Atrophy with small vessel disease. Prior infarct in the posterior inferior right cerebellum, stable. Small lacunar infarcts in each internal capsule, stable. No intracranial mass, hemorrhage, or acute appearing infarct. Mild inferior maxillary sinus disease bilaterally.   Electronically Signed   By: Lowella Grip M.D.   On: 04/30/2014 15:16    Scheduled Meds: . aspirin EC  325 mg Oral Daily  . atorvastatin  40 mg Oral q1800  . divalproex  1,750 mg Oral QPM  . enoxaparin (LOVENOX) injection  30 mg Subcutaneous Q24H  . FLUoxetine  60 mg Oral Daily  . LORazepam  1 mg Intravenous Once  . mirtazapine  45 mg Oral QHS  . pantoprazole  40 mg Oral Daily  . salsalate  1,500 mg Oral BID   Continuous Infusions: . sodium chloride 100 mL/hr at 04/30/14 2358    Principal Problem:   Dysarthria Active Problems:   Bipolar 1 disorder   HTN (hypertension)   AKI (acute kidney injury)   Hypotension   Fall    Time spent: 64 min    [name]  [facility] Pager [phone]. If 7PM-7AM, please contact night-coverage at [email or web address], password [facility] 05/01/2014, 5:10 PM  LOS: 1 day

## 2014-05-01 NOTE — Procedures (Signed)
History: 68 yo M with confusion  Sedation: None  Technique: This is a 17 channel routine scalp EEG performed at the bedside with bipolar and monopolar montages arranged in accordance to the international 10/20 system of electrode placement. One channel was dedicated to EKG recording.    Background: The back ground consists predominantly of intermixed alpha and beta activities during the waking state. There is a well defined posterior dominant rhythm of 9 Hz that attenuates with eye opening. There is an increase in delta with drowsiness and vertex waves and spindles are observed.   Photic stimulation: Physiologic driving is present  EEG Abnormalities: none  Clinical Interpretation: This normal EEG is recorded in the waking and sleep state. There was no seizure or seizure predisposition recorded on this study.   Roland Rack, MD [facility] [phone]  If 7pm- 7am, please page neurology on call as listed in [place].

## 2014-05-01 NOTE — Evaluation (Signed)
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 Patient reports that he was sitting in front of his computer yesterday evening when suddenly developed an odd feeling, then became very sweaty, confused, and lost control of his bladder. EMS was called and found him with flaccid weakness in the left side, having speech difficulty and pt with SBP in the low 80's. CT/CTA brain showed no acute intracranial abnormality.     Clinical Impression  Patient presents with functional limitations due to deficits listed in PT problem list (see below). Pt with generalized weakness and balance deficits impacting safe mobility. Balance improved with use of RW for support. Will need to assess ability to negotiate 17 steps prior to return home. Pt would benefit from acute PT to improve transfers, gait, balance and mobility so pt can maximize independence and minimize fall risk at home.    Follow Up Recommendations Home health PT;Supervision/Assistance - 24 hour    Equipment Recommendations  None recommended by PT    Recommendations for Other Services       Precautions / Restrictions Precautions Precautions: Fall Restrictions Weight Bearing Restrictions: No      Mobility  Bed Mobility Overal bed mobility: Modified Independent             General bed mobility comments: HOB elevated, use of rails.  Transfers Overall transfer level: Needs assistance Equipment used: None Transfers: Sit to/from Stand Sit to Stand: Min guard         General transfer comment: Min guard for safety. Sway noted upon standing.  Ambulation/Gait Ambulation/Gait assistance: Min guard Ambulation Distance (Feet): 150 Feet Assistive  device: Rolling walker (2 wheeled) Gait Pattern/deviations: Step-through pattern;Decreased stride length;Shuffle;Trunk flexed Gait velocity: decreased   General Gait Details: Unsteady gait pattern with assist to negotiate RW esp during turns. No overt LOB. Attempted to ambulate without RW, increased sway and pt reaching for objects for support.   Stairs            Wheelchair Mobility    Modified Rankin (Stroke Patients Only)       Balance Overall balance assessment: Needs assistance;History of Falls Sitting-balance support: Feet supported Sitting balance-[name]: Good     Standing balance support: During functional activity Standing balance-[name]: Fair Standing balance comment: Tolerated static standing without UE support with sway noted however requires UE support on RW for balance/safety during gait.                             Pertinent Vitals/Pain Pain Assessment: No/denies pain    Home Living Family/patient expects to be discharged to:: Private residence Living Arrangements: Spouse/significant other Available Help at Discharge: Family;Available 24 hours/day Type of Home: House Home Access: Stairs to enter Entrance Stairs-Rails: None Entrance Stairs-Number of Steps: 1 Home Layout: Two level;Bed/bath upstairs Home Equipment: Cane - single point;Grab bars - tub/shower;Walker - 2 wheels;Bedside commode      Prior Function Level of Independence: Independent with assistive device(s)         Comments: Pt uses SPC at all times during mobility. Wife reports recent decline in ambulation over the last 2 months. Reports falls and difficulty negotiating steps.     Hand Dominance  Dominant Hand: Right    Extremity/Trunk Assessment   Upper Extremity Assessment: Defer to OT evaluation           Lower Extremity Assessment: Generalized weakness      Cervical / Trunk Assessment: Normal  Communication   Communication: No difficulties  (Mild dysarthria?)  Cognition Arousal/Alertness: Awake/alert Behavior During Therapy: WFL for tasks assessed/performed Overall Cognitive Status: Impaired/Different from baseline Area of Impairment: Safety/judgement     Memory: Decreased short-term memory   Safety/Judgement: Decreased awareness of safety     General Comments: "Maybe you can keep the alarm off, so I can sneak around."    General Comments General comments (skin integrity, edema, etc.): Pt wears glasses.     Exercises        Assessment/Plan    PT Assessment Patient needs continued PT services  PT Diagnosis Difficulty walking;Generalized weakness   PT Problem List Decreased strength;Decreased cognition;Decreased activity tolerance;Decreased knowledge of use of DME;Decreased safety awareness;Decreased balance;Decreased mobility  PT Treatment Interventions Balance training;Gait training;Neuromuscular re-education;DME instruction;Stair training;Functional mobility training;Patient/family education;Therapeutic activities;Therapeutic exercise   PT Goals (Current goals can be found in the Care Plan section) Acute Rehab PT Goals Patient Stated Goal: to go home PT Goal Formulation: With patient Time For Goal Achievement: 05/15/14 Potential to Achieve Goals: Good    Frequency Min 4X/week   Barriers to discharge Inaccessible home environment pt has to negotiate 17 steps to get to bedroom.    Co-evaluation               End of Session Equipment Utilized During Treatment: Gait belt Activity Tolerance: Patient limited by fatigue Patient left: in bed;with call bell/phone within reach;with bed alarm set;with family/visitor present Nurse Communication: Mobility status         Time: 1345-1405 PT Time Calculation (min): 20 min   Charges:   PT Evaluation $Initial PT Evaluation Tier I: 1 Procedure PT Treatments $Gait Training: 8-22 mins   PT G CodesCandy Sledge A 05/01/2014, 2:49 PM Candy Sledge, [place], DPT [phone]

## 2014-05-01 NOTE — Progress Notes (Addendum)
Occupational Therapy Evaluation Patient Details Name: Jason [name] MRN: [id number] DOB: August 25, 1945 Today's Date: 05/01/2014    History of Present Illness Jason [name] is a 68 y.o. Male admitted 04/30/14 with confusion and dysarthria. 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 CT/CTA were negative for acute infarct. MRI pending.    Clinical Impression   PTA pt lived at home and reports he was independent with use of [facility] for ADLs. Pt demonstrated mild difficulties with word finding and dysarthria. Pt reports increased forgetfulness and states that he has had multiple falls at home. Pt would benefit from [facility] to address safety at home and use of visual aids for reminders to decrease pt risk of fall. Pt will benefit from acute OT for safety with ADLs and to address cognition.     Follow Up Recommendations  Home health OT;Supervision/Assistance - 24 hour    Equipment Recommendations  Tub/shower bench    Recommendations for Other Services       Precautions / Restrictions Precautions Precautions: Fall Precaution Comments: mild balance deficits Restrictions Weight Bearing Restrictions: No      Mobility Bed Mobility Overal bed mobility: Modified Independent             General bed mobility comments: HOB elevated, use of rails.  Transfers Overall transfer level: Needs assistance Equipment used: None Transfers: Sit to/from Stand Sit to Stand: Min guard         General transfer comment: Min guard for safety. Sway noted upon standing.    Balance Overall balance assessment: Needs assistance;History of Falls Sitting-balance support: Feet supported Sitting balance-[name]: Good     Standing balance support: During functional activity Standing balance-[name]: Fair Standing balance comment: Tolerated static standing without UE support with sway noted however requires UE support on RW for  balance/safety during gait.                            ADL Overall ADL's : Needs assistance/impaired Eating/Feeding: Independent;Sitting   Grooming: Min guard;Standing   Upper Body Bathing: Set up;Sitting   Lower Body Bathing: Min guard;Sit to/from stand   Upper Body Dressing : Set up;Sitting   Lower Body Dressing: Min guard;Sit to/from stand   Toilet Transfer: Min guard;Ambulation;RW           Functional mobility during ADLs: Min guard;Rolling walker General ADL Comments: Reinforced education on use of RW for improved balance during functional mobility. Pt admits he does not sit for his shower and had difficulty verbalizing the sequencing for shower transfer. Noted that previous therapists have recommended that pt sit for shower, and he admits having falls in shower recently. Encouraged pt to sit for full duration of shower and may need to discuss with family the need for visual cue to remind pt to sit for duration. Also think pt would benefit from tub bench (difficult to determine whether he has one).      Vision  Pt wears glasses and reports no change from baseline.  No apparent deficits.                  Perception Perception Perception Tested?: No   Praxis Praxis Praxis tested?: Within functional limits    Pertinent Vitals/Pain Pain Assessment: No/denies pain     Hand Dominance Right   Extremity/Trunk Assessment Upper Extremity Assessment Upper Extremity Assessment: Generalized weakness (mild dysmetria at baseline Bilaterally)   Lower  Extremity Assessment Lower Extremity Assessment: Generalized weakness   Cervical / Trunk Assessment Cervical / Trunk Assessment: Normal   Communication Communication Communication: Expressive difficulties (mild word finding difficulty, mild dysarthria)   Cognition Arousal/Alertness: Awake/alert Behavior During Therapy: WFL for tasks assessed/performed Overall Cognitive Status: Impaired/Different from  baseline Area of Impairment: Attention;Memory;Safety/judgement;Awareness;Problem solving   Current Attention Level: Sustained Memory: Decreased short-term memory   Safety/Judgement: Decreased awareness of safety Awareness: Anticipatory Problem Solving: Difficulty sequencing;Requires verbal cues General Comments: Pt reports increased forgetfulness and slurring of speech as symptoms that have worsened in the last few days.               Home Living Family/patient expects to be discharged to:: Private residence Living Arrangements: Spouse/significant other Available Help at Discharge: Family;Available 24 hours/day Type of Home: House Home Access: Stairs to enter [employer] of Steps: 1 Entrance Stairs-Rails: None Home Layout: Two level;Bed/bath upstairs Alternate Level Stairs-Number of Steps: 17 with landing half way. Alternate Level Stairs-Rails: Left Bathroom Shower/Tub: Tub/shower unit Shower/tub characteristics: Curtain Biochemist, clinical: Standard     Home Equipment: Cane - single point;Grab bars - tub/shower;Walker - 2 wheels;Bedside commode;Shower seat   Additional Comments: pt possibly has tub bench? difficult to determine due to tangential speech and pt with difficulty explaining sequencing for tub transfer. Pt initially reported he had a "walk in shower."      Prior Functioning/Environment Level of Independence: Independent with assistive device(s)        Comments: Pt uses SPC at all times during mobility. Wife reports recent decline in ambulation over the last 2 months. Reports falls and difficulty negotiating steps. Pt reports falls in shower over the last few weeks and admits he does not sit to shower.     OT Diagnosis: Generalized weakness;Cognitive deficits   OT Problem List: Decreased strength;Decreased activity tolerance;Impaired balance (sitting and/or standing);Decreased cognition;Decreased safety awareness;Decreased knowledge of use of DME or  AE   OT Treatment/Interventions: Self-care/ADL training;Therapeutic exercise;Energy conservation;DME and/or AE instruction;Therapeutic activities;Patient/family education;Balance training;Cognitive remediation/compensation    OT Goals(Current goals can be found in the care plan section) Acute Rehab OT Goals Patient Stated Goal: to get back to walking daily OT Goal Formulation: With patient Time For Goal Achievement: 05/15/14 Potential to Achieve Goals: Good ADL Goals Pt Will Perform Grooming: with modified independence;standing Pt Will Perform Lower Body Bathing: with modified independence;sit to/from stand Pt Will Perform Lower Body Dressing: with modified independence;sit to/from stand Pt Will Transfer to Toilet: with modified independence;ambulating Pt Will Perform Tub/Shower Transfer: Tub transfer;with modified independence;tub bench;rolling walker Additional ADL Goal #1: Pt will verbalize 3/3 fall prevention strategies Independently to promote safety with ADLs.   OT Frequency: Min 2X/week              End of Session Equipment Utilized During Treatment: Rolling walker  Activity Tolerance: Patient tolerated treatment well Patient left: in bed;with call bell/phone within reach;with bed alarm set   Time: 0092-3300 OT Time Calculation (min): 32 min     May 02, 2014 1500  OT G-codes **NOT FOR INPATIENT CLASS**  Functional Assessment Tool Used clinical judgment  Functional Limitation Self care  Self Care Current Status (T6226) CI  Self Care Goal Status (J3354) [place]    Charges:  OT General Charges $OT Visit: 1 Procedure OT Evaluation $Initial OT Evaluation Tier I: 1 Procedure OT Treatments $Self Care/Home Management : 23-37 mins  Juluis Rainier 05-02-2014, 4:00 PM  Cyndie Chime, OTR/L Occupational Therapist [phone] (pager)

## 2014-05-01 NOTE — Progress Notes (Signed)
Chaplain visited with pt for approx 20 min. Pt told chaplain about illness journey and concerns about not having a pinpointed answer of what is going on. Pt is eager to receive more information about his prognosis. Chaplain and pt explored pt's concept of the divine. Chaplain informed pt about her further services. Chaplain will follow as needed.   05/01/14 1500  Clinical Encounter Type  Visited With Patient  Visit Type Initial;Spiritual support  Referral From Nurse  Spiritual Encounters  Spiritual Needs Emotional  Stress Factors  Patient Stress Factors Health changes;Lack of caregivers  Marcelino Scot 05/01/2014 3:57 PM

## 2014-05-01 NOTE — Progress Notes (Signed)
Patient was scheduled to have MRI on 04/30/2014 at 2000 patient request order for Ativan for anxiety prior to procedure.  Notified Dr. Lamar Blinks received order 2230.  MRI tech states will do procedure in the morning.

## 2014-05-02 ENCOUNTER — Inpatient Hospital Stay (HOSPITAL_COMMUNITY): Payer: Medicare Other

## 2014-05-02 DIAGNOSIS — R471 Dysarthria and anarthria: Secondary | ICD-10-CM

## 2014-05-02 DIAGNOSIS — G9340 Encephalopathy, unspecified: Secondary | ICD-10-CM

## 2014-05-02 DIAGNOSIS — I10 Essential (primary) hypertension: Secondary | ICD-10-CM

## 2014-05-02 DIAGNOSIS — N179 Acute kidney failure, unspecified: Secondary | ICD-10-CM

## 2014-05-02 DIAGNOSIS — I9589 Other hypotension: Secondary | ICD-10-CM | POA: Diagnosis not present

## 2014-05-02 MED ORDER — LISINOPRIL 10 MG PO TABS
10.0000 mg | ORAL_TABLET | Freq: Every day | ORAL | Status: DC
  Filled 2014-05-02: qty 1

## 2014-05-02 NOTE — Progress Notes (Signed)
Transport picked up patient from [facility] went down for MRI at 2353 received Ativan 1 mg prior to procedure.

## 2014-05-02 NOTE — Progress Notes (Signed)
Occupational Therapy Treatment Patient Details Name: Jason [name] MRN: [id number] DOB: 1945-07-15 Today's Date: 05/02/2014    History of present illness 68 y.o. male admitted 04/30/14 with confusion and dysarthria. 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 CT/CTA were negative for acute infarct. MRI pending.    OT comments  Pt asleep on arrival, pt's wife present and stated he had been sleeping all day and had not been up more than 30 minutes since she arrived at 9 AM. Once awake and progressing to EOB, pt became slightly more alert. Pt completed ADLs and mobility at a min guard (A) level with RW use. Pt limited by severe lethargic state. On return to bed, pt fell back asleep within 1-2 minutes. Pt will benefit from continued OT services to increase safety and independence with ADLs and mobility.     Follow Up Recommendations  Home health OT;Supervision/Assistance - 24 hour    Equipment Recommendations       Recommendations for Other Services      Precautions / Restrictions Precautions Precautions: Fall Restrictions Weight Bearing Restrictions: No       Mobility Bed Mobility Overal bed mobility: Modified Independent             General bed mobility comments: Use of bed rails. Pt reported being dizzy at EOB  Transfers Overall transfer level: Needs assistance Equipment used: Rolling walker (2 wheeled) Transfers: Sit to/from Stand Sit to Stand: Min guard         General transfer comment: Min guard (A) for balance and safety. Pt reported being dizzy at EOB    Balance Overall balance assessment: Needs assistance Sitting-balance support: No upper extremity supported;Feet supported Sitting balance-[name]: Good Sitting balance - Comments: Pt reported being dizzy at EOB   Standing balance support: No upper extremity supported;During functional activity Standing balance-[name]:  Fair Standing balance comment: Upon standing pt reports still feeling dizzy. Pt tolerated static stand without UE during toileting and sink level adl, but needed RW for balance/safety while ambulating                    ADL Overall ADL's : Needs assistance/impaired     Grooming: Wash/dry hands;Min Dispensing optician: Min guard;Ambulation;RW           Functional mobility during ADLs: Min guard;Rolling walker General ADL Comments: Pt completing adls and mobility at min guard (A) level for balance support and safety. Pt very lethargic, but once up and moving he becomes more responsive and alert. Obtained information from wife, pt does have shower bench at home. Wife reports it is difficult to get pt to use it. Educated wife (pt fell back asleep) on encouraging pt to sit during showers and to provide (A) when using stairs (pt fell on stairs twice this past Monday, 04/29/14).        Vision                     Perception     Praxis      Cognition   Behavior During Therapy: Flat affect Overall Cognitive Status: Impaired/Different from baseline Area of Impairment: Safety/judgement;Awareness;Attention   Current Attention Level: Sustained Memory: Decreased short-term memory    Safety/Judgement: Decreased awareness of safety Awareness: Intellectual   General Comments: When asked about incr sleeping, pt unsure if  this was unusual for him and looked to wife to answer. Pt very lethargic on arrival, but became more alert once EOB and ambulating. Once pt returned to bed, he fell asleep within 1-2 minutes    Extremity/Trunk Assessment               Exercises     Shoulder Instructions       General Comments      Pertinent Vitals/ Pain       Pain Assessment: 0-10 Pain Score: 2  Pain Location: heartburn Pain Descriptors / Indicators: Burning Pain Intervention(s): Monitored during session;Repositioned  Home Living                                           Prior Functioning/Environment              Frequency Min 2X/week     Progress Toward Goals  OT Goals(current goals can now be found in the care plan section)     Acute Rehab OT Goals Patient Stated Goal: none stated OT Goal Formulation: With patient Time For Goal Achievement: 05/15/14 Potential to Achieve Goals: Good ADL Goals Pt Will Perform Grooming: with modified independence;standing Pt Will Perform Lower Body Bathing: with modified independence;sit to/from stand Pt Will Perform Lower Body Dressing: with modified independence;sit to/from stand Pt Will Transfer to Toilet: with modified independence;ambulating Pt Will Perform Tub/Shower Transfer: Tub transfer;with modified independence;tub bench;rolling walker Additional ADL Goal #1: Pt will verbalize 3/3 fall prevention strategies Independently to promote safety with ADLs.   Plan Discharge plan remains appropriate    Co-evaluation                 End of Session Equipment Utilized During Treatment: Gait belt;Rolling walker   Activity Tolerance Patient limited by lethargy   Patient Left in bed;with call bell/phone within reach;with family/visitor present   Nurse Communication Mobility status;Precautions        Time: 3335-4562 OT Time Calculation (min): 24 min  Charges:    Redmond Baseman 05/02/2014, 2:22 PM

## 2014-05-02 NOTE — Progress Notes (Signed)
[facility] PROGRESS NOTE  Jason [name] XIH:[id number] DOB: 1945/07/06 DOA: 04/30/2014 PCP: Verline Lema, MD  Assessment/Plan: 1. Acute encephalopathy. -Patient having a steep functional decline/failure to thrive, and becoming increasing confused. -Continues to have dysarthria and generalized weakness. -MRI did not reveal acute intracranial infarct or other abnormality identified -EEG unremarkable -Occupational therapy and physical therapy recommending home health services. -Continue aspirin 325 mg by mouth daily and statin therapy -Unclear what may have caused encephalopathy, case discussed with neurology had no further recommendations. His wife expressing concerns for taking him home.   2. Hypertension. -Patient's blood pressures fluctuating, having last blood pressure 171/89 -Will restart lisinopril 10 mg by mouth daily  3.  Dyslipidemia -Continue Lipitor 40 mg by mouth daily  4.  Bipolar disorder -Continue Depakote  Code Status: Full code Family Communication:  Disposition Plan: Physical therapy and occupational therapy recommending home health services   Consultants:  Neurology   HPI/Subjective: Patient is a pleasant 68 year old gentleman with a past medical history hypertension, history of cerebellar infarct with left-sided residual weakness, admitted to the medicine service on 04/30/2014 presented with acute encephalopathy, failure to thrive and functional decline. CT scan of brain without contrast performed on admission showed prior infarct in the posterior inferior right cerebellum which was stable. No new infarct was noted. Patient was seen and evaluated by neurology. He remains stable, currently pending MRI of the brain.  Objective: Filed Vitals:   05/02/14 1729  BP: 171/89  Pulse: 84  Temp: 98.8 F (37.1 C)  Resp: 20   No intake or output data in the 24 hours ending 05/02/14 1811 Filed Weights   04/30/14 2031  Weight: 87.998 kg (194 lb)     Exam:   General:  Patient is in no acute distress, awake, following commands, he ambulated with walker  Cardiovascular: Regular rate and rhythm normal S1-S2  Respiratory: Clear to auscultation bilaterally  Abdomen: Soft nontender nondistended positive bowel sounds  Musculoskeletal: Preserved range of motion 12 extremities  Neurological. He has 4-5 muscle strength to left upper and left lower extremities, 5 of 5 strength to right upper and lower extremities, sensation was intact  Data Reviewed: Basic Metabolic Panel:  Recent Labs Lab 04/30/14 1337 04/30/14 1344 04/30/14 2142  NA 146 144  --   K 4.1 4.0  --   CL 105 106  --   CO2 27  --   --   GLUCOSE 84 83  --   BUN 22 23  --   CREATININE 1.65* 1.90* 1.35  CALCIUM 9.0  --   --    Liver Function Tests:  Recent Labs Lab 04/30/14 1337  AST 15  ALT 8  ALKPHOS 121*  BILITOT <0.2*  PROT 6.9  ALBUMIN 3.4*   No results found for this basename: LIPASE, AMYLASE,  in the last 168 hours  Recent Labs Lab 04/30/14 1738  AMMONIA 42   CBC:  Recent Labs Lab 04/30/14 1337 04/30/14 1344 04/30/14 2142  WBC 7.9  --  6.8  NEUTROABS 5.6  --   --   HGB 9.8* 10.5* 9.1*  HCT 31.6* 31.0* 30.0*  MCV 90.3  --  89.3  PLT 144*  --  157   Cardiac Enzymes: No results found for this basename: CKTOTAL, CKMB, CKMBINDEX, TROPONINI,  in the last 168 hours BNP (last 3 results) No results found for this basename: PROBNP,  in the last 8760 hours CBG:  Recent Labs Lab 05/01/14 2236  GLUCAP 79    No  results found for this or any previous visit (from the past 240 hour(s)).   Studies: Mri Brain Without Contrast  05/02/2014   CLINICAL DATA:  Initial evaluation for worsening gait, altered mental status. History of stroke.  EXAM: MRI HEAD WITHOUT CONTRAST  MRA HEAD WITHOUT CONTRAST  TECHNIQUE: Multiplanar, multiecho pulse sequences of the brain and surrounding structures were obtained without intravenous contrast. Angiographic  images of the head were obtained using MRA technique without contrast.  COMPARISON:  Prior CT from 04/30/2014 as well as MRI from 04/13/2014.  FINDINGS: MRI HEAD FINDINGS  Diffuse prominence of the CSF containing spaces is compatible with generalized cerebral atrophy. Patchy T2/FLAIR hyperintensity within the periventricular and deep white matter is most consistent with moderate chronic small vessel ischemic disease. Patchy small vessel changes present within the pons as well.  No focal parenchymal signal abnormality is identified. No mass lesion, midline shift, or extra-axial fluid collection. Ventricles are normal in size without evidence of hydrocephalus.  No diffusion-weighted signal abnormality is identified to suggest acute intracranial infarct. Gray-white matter differentiation is maintained. Normal flow voids are seen within the intracranial vasculature. No intracranial hemorrhage identified.  Encephalomalacia within the right cerebral hemisphere again noted, compatible with remote right pica territory infarct. Small remote lacunar infarcts present within the basal ganglia bilaterally.  The cervicomedullary junction is normal. Pituitary gland is within normal limits. Pituitary stalk is midline. The globes and optic nerves demonstrate a normal appearance with normal signal intensity.  The bone marrow signal intensity is normal. Calvarium is intact. Visualized upper cervical spine is within normal limits.  Scalp soft tissues are unremarkable.  Paranasal sinuses are clear.  No mastoid effusion.  MRA HEAD FINDINGS  ANTERIOR CIRCULATION:  Distal cervical segments of the internal carotid arteries are widely patent with antegrade flow. Petrous, cavernous, and supra clinoid segments are widely patent bilaterally without hemodynamically significant stenosis. Right A1 segment is mildly hypoplastic. Anterior communicating artery and anterior cerebral arteries within normal limits.  The M1 segments well opacified  without proximal branch occlusion or hemodynamically significant stenosis. MCA bifurcations within normal limits. Distal MCA branches symmetric bilaterally.  POSTERIOR CIRCULATION:  Left vertebral artery is dominant and widely patent. The diminutive right vertebral artery is grossly patent but not well evaluated. Vertebrobasilar junction and basilar artery within normal limits. Superior cerebral arteries patent bilaterally. Posterior cerebral arteries well opacified without hemodynamically significant stenosis.  IMPRESSION: MRI HEAD IMPRESSION:  1. No acute intracranial infarct or other abnormality identified. 2. Atrophy with chronic small vessel ischemic disease, stable from prior. 3. Remote right cerebellar infarct.  MRA HEAD IMPRESSION:  1. Dominant left vertebral artery with poorly visualized right vertebral artery, which may be occluded or partially occluded proximally within the neck. 2. No other proximal branch occlusion or hemodynamically significant stenosis identified within the intracranial circulation.   Electronically Signed   By: Jeannine Boga M.D.   On: 05/02/2014 03:05   Mr Jodene Nam Head/brain Wo Cm  05/02/2014   CLINICAL DATA:  Initial evaluation for worsening gait, altered mental status. History of stroke.  EXAM: MRI HEAD WITHOUT CONTRAST  MRA HEAD WITHOUT CONTRAST  TECHNIQUE: Multiplanar, multiecho pulse sequences of the brain and surrounding structures were obtained without intravenous contrast. Angiographic images of the head were obtained using MRA technique without contrast.  COMPARISON:  Prior CT from 04/30/2014 as well as MRI from 04/13/2014.  FINDINGS: MRI HEAD FINDINGS  Diffuse prominence of the CSF containing spaces is compatible with generalized cerebral atrophy. Patchy T2/FLAIR hyperintensity within the  periventricular and deep white matter is most consistent with moderate chronic small vessel ischemic disease. Patchy small vessel changes present within the pons as well.  No focal  parenchymal signal abnormality is identified. No mass lesion, midline shift, or extra-axial fluid collection. Ventricles are normal in size without evidence of hydrocephalus.  No diffusion-weighted signal abnormality is identified to suggest acute intracranial infarct. Gray-white matter differentiation is maintained. Normal flow voids are seen within the intracranial vasculature. No intracranial hemorrhage identified.  Encephalomalacia within the right cerebral hemisphere again noted, compatible with remote right pica territory infarct. Small remote lacunar infarcts present within the basal ganglia bilaterally.  The cervicomedullary junction is normal. Pituitary gland is within normal limits. Pituitary stalk is midline. The globes and optic nerves demonstrate a normal appearance with normal signal intensity.  The bone marrow signal intensity is normal. Calvarium is intact. Visualized upper cervical spine is within normal limits.  Scalp soft tissues are unremarkable.  Paranasal sinuses are clear.  No mastoid effusion.  MRA HEAD FINDINGS  ANTERIOR CIRCULATION:  Distal cervical segments of the internal carotid arteries are widely patent with antegrade flow. Petrous, cavernous, and supra clinoid segments are widely patent bilaterally without hemodynamically significant stenosis. Right A1 segment is mildly hypoplastic. Anterior communicating artery and anterior cerebral arteries within normal limits.  The M1 segments well opacified without proximal branch occlusion or hemodynamically significant stenosis. MCA bifurcations within normal limits. Distal MCA branches symmetric bilaterally.  POSTERIOR CIRCULATION:  Left vertebral artery is dominant and widely patent. The diminutive right vertebral artery is grossly patent but not well evaluated. Vertebrobasilar junction and basilar artery within normal limits. Superior cerebral arteries patent bilaterally. Posterior cerebral arteries well opacified without hemodynamically  significant stenosis.  IMPRESSION: MRI HEAD IMPRESSION:  1. No acute intracranial infarct or other abnormality identified. 2. Atrophy with chronic small vessel ischemic disease, stable from prior. 3. Remote right cerebellar infarct.  MRA HEAD IMPRESSION:  1. Dominant left vertebral artery with poorly visualized right vertebral artery, which may be occluded or partially occluded proximally within the neck. 2. No other proximal branch occlusion or hemodynamically significant stenosis identified within the intracranial circulation.   Electronically Signed   By: Jeannine Boga M.D.   On: 05/02/2014 03:05    Scheduled Meds: . aspirin EC  325 mg Oral Daily  . atorvastatin  40 mg Oral q1800  . divalproex  1,750 mg Oral QPM  . enoxaparin (LOVENOX) injection  30 mg Subcutaneous Q24H  . FLUoxetine  60 mg Oral Daily  . mirtazapine  45 mg Oral QHS  . pantoprazole  40 mg Oral Daily  . salsalate  1,500 mg Oral BID   Continuous Infusions: . sodium chloride 100 mL/hr at 04/30/14 2358    Principal Problem:   Dysarthria Active Problems:   Bipolar 1 disorder   HTN (hypertension)   AKI (acute kidney injury)   Hypotension   Fall    Time spent: 67 min    [name]  [facility] Pager [phone]. If 7PM-7AM, please contact night-coverage at [email or web address], password [facility] 05/02/2014, 6:11 PM  LOS: 2 days

## 2014-05-02 NOTE — Progress Notes (Signed)
Patient returned to 4N29 from MRI procedure via tech transport at 0100.

## 2014-05-02 NOTE — Progress Notes (Signed)
Subjective:  No further complaints.  Still stating he has some slurred speech.    Objective: Current vital signs: BP 138/60  Pulse 86  Temp(Src) 98.8 F (37.1 C) (Axillary)  Resp 20  Ht 5\' 6"  (1.676 m)  Wt 87.998 kg (194 lb)  BMI 31.33 kg/m2  SpO2 96% Vital signs in last 24 hours: Temp:  [97.9 F (36.6 C)-100 F (37.8 C)] 98.8 F (37.1 C) (10/29 0945) Pulse Rate:  [85-92] 86 (10/29 0945) Resp:  [16-20] 20 (10/29 0945) BP: (98-166)/(60-80) 138/60 mmHg (10/29 0945) SpO2:  [93 %-97 %] 96 % (10/29 0945)  Intake/Output from previous day: 10/28 0701 - 10/29 0700 In: -  Out: 300 [Urine:300] Intake/Output this shift:   Nutritional status: Cardiac  Neurologic Exam: General: NAD Mental Status: Alert, oriented, thought content appropriate.  Speech slurred without evidence of aphasia.  Able to follow 3 step commands without difficulty. Cranial Nerves: II: Visual fields grossly normal, pupils equal, round, reactive to light and accommodation III,IV, VI: ptosis not present, extra-ocular motions intact bilaterally V,VII: smile symmetric, facial light touch sensation normal bilaterally VIII: hearing normal bilaterally IX,X: gag reflex present XI: bilateral shoulder shrug XII: midline tongue extension without atrophy or fasciculations  Motor: Right : Upper extremity   5/5    Left:     Upper extremity   5/5  Lower extremity   5/5     Lower extremity   5/5 Tone and bulk:normal tone throughout; no atrophy noted Sensory: Pinprick and light touch intact throughout, bilaterally Deep Tendon Reflexes:  Right: Upper Extremity   Left: Upper extremity   biceps (C-5 to C-6) 2/4   biceps (C-5 to C-6) 2/4 tricep (C7) 2/4    triceps (C7) 2/4 Brachioradialis (C6) 2/4  Brachioradialis (C6) 2/4  Lower Extremity Lower Extremity  quadriceps (L-2 to L-4) 2/4   quadriceps (L-2 to L-4) 2/4 Achilles (S1) 1/4   Achilles (S1) 1/4  Plantars: Right: downgoing   Left: downgoing Cerebellar: normal  finger-to-nose,  normal heel-to-shin test    Lab Results: Basic Metabolic Panel:  Recent Labs Lab 04/30/14 1337 04/30/14 1344 04/30/14 2142  NA 146 144  --   K 4.1 4.0  --   CL 105 106  --   CO2 27  --   --   GLUCOSE 84 83  --   BUN 22 23  --   CREATININE 1.65* 1.90* 1.35  CALCIUM 9.0  --   --     Liver Function Tests:  Recent Labs Lab 04/30/14 1337  AST 15  ALT 8  ALKPHOS 121*  BILITOT <0.2*  PROT 6.9  ALBUMIN 3.4*   No results found for this basename: LIPASE, AMYLASE,  in the last 168 hours  Recent Labs Lab 04/30/14 1738  AMMONIA 42    CBC:  Recent Labs Lab 04/30/14 1337 04/30/14 1344 04/30/14 2142  WBC 7.9  --  6.8  NEUTROABS 5.6  --   --   HGB 9.8* 10.5* 9.1*  HCT 31.6* 31.0* 30.0*  MCV 90.3  --  89.3  PLT 144*  --  157    Cardiac Enzymes: No results found for this basename: CKTOTAL, CKMB, CKMBINDEX, TROPONINI,  in the last 168 hours  Lipid Panel:  Recent Labs Lab 05/01/14 0700  CHOL 162  TRIG 177*  HDL 26*  CHOLHDL 6.2  VLDL 35  LDLCALC 101*    CBG:  Recent Labs Lab 05/01/14 2236  GLUCAP 79    Microbiology: Results for orders placed during  the hospital encounter of 11/19/12  GC/CHLAMYDIA PROBE AMP     Status: None   Collection Time    11/19/12  6:38 PM      Result Value Ref Range Status   CT Probe RNA NEGATIVE  NEGATIVE Final   GC Probe RNA NEGATIVE  NEGATIVE Final   Comment: (NOTE)                                                                                             Normal Reference Range: Negative          Assay performed using the Gen-Probe APTIMA COMBO2 (R) Assay.     Acceptable specimen types for this assay include APTIMA Swabs (Unisex,     endocervical, urethral, or vaginal), first void urine, and ThinPrep     liquid based cytology samples.    Coagulation Studies:  Recent Labs  04/30/14 1337  LABPROT 14.2  INR 1.08    Imaging: Ct Head (brain) Wo Contrast  04/30/2014   CLINICAL DATA:   Dizziness for 3 days. Left-sided weakness from prior CVA which patient reports is becoming more pronounced. Dysarthria  EXAM: CT HEAD WITHOUT CONTRAST  TECHNIQUE: Contiguous axial images were obtained from the base of the skull through the vertex without intravenous contrast.  COMPARISON:  Head CT and brain MRI April 13, 2014  FINDINGS: There is moderate generalized atrophy, stable. There is no intracranial mass, hemorrhage, extra-axial fluid collection, or midline shift. There is evidence of an old infarct in the posterior inferior cerebellum, stable. There is patchy small vessel disease in the centra semiovale bilaterally, stable. There are small lacunar type infarcts in the anterior limb of each internal capsule. There is no new gray-white compartment lesion. No acute infarct apparent.  The bony calvarium appears intact. The mastoid air cells are clear. There is mild mucosal thickening in the inferior maxillary antra bilaterally.  IMPRESSION: Atrophy with small vessel disease. Prior infarct in the posterior inferior right cerebellum, stable. Small lacunar infarcts in each internal capsule, stable. No intracranial mass, hemorrhage, or acute appearing infarct. Mild inferior maxillary sinus disease bilaterally.   Electronically Signed   By: Lowella Grip M.D.   On: 04/30/2014 15:16   Mri Brain Without Contrast  05/02/2014   CLINICAL DATA:  Initial evaluation for worsening gait, altered mental status. History of stroke.  EXAM: MRI HEAD WITHOUT CONTRAST  MRA HEAD WITHOUT CONTRAST  TECHNIQUE: Multiplanar, multiecho pulse sequences of the brain and surrounding structures were obtained without intravenous contrast. Angiographic images of the head were obtained using MRA technique without contrast.  COMPARISON:  Prior CT from 04/30/2014 as well as MRI from 04/13/2014.  FINDINGS: MRI HEAD FINDINGS  Diffuse prominence of the CSF containing spaces is compatible with generalized cerebral atrophy. Patchy T2/FLAIR  hyperintensity within the periventricular and deep white matter is most consistent with moderate chronic small vessel ischemic disease. Patchy small vessel changes present within the pons as well.  No focal parenchymal signal abnormality is identified. No mass lesion, midline shift, or extra-axial fluid collection. Ventricles are normal in size without evidence of hydrocephalus.  No diffusion-weighted signal abnormality is  identified to suggest acute intracranial infarct. Gray-white matter differentiation is maintained. Normal flow voids are seen within the intracranial vasculature. No intracranial hemorrhage identified.  Encephalomalacia within the right cerebral hemisphere again noted, compatible with remote right pica territory infarct. Small remote lacunar infarcts present within the basal ganglia bilaterally.  The cervicomedullary junction is normal. Pituitary gland is within normal limits. Pituitary stalk is midline. The globes and optic nerves demonstrate a normal appearance with normal signal intensity.  The bone marrow signal intensity is normal. Calvarium is intact. Visualized upper cervical spine is within normal limits.  Scalp soft tissues are unremarkable.  Paranasal sinuses are clear.  No mastoid effusion.  MRA HEAD FINDINGS  ANTERIOR CIRCULATION:  Distal cervical segments of the internal carotid arteries are widely patent with antegrade flow. Petrous, cavernous, and supra clinoid segments are widely patent bilaterally without hemodynamically significant stenosis. Right A1 segment is mildly hypoplastic. Anterior communicating artery and anterior cerebral arteries within normal limits.  The M1 segments well opacified without proximal branch occlusion or hemodynamically significant stenosis. MCA bifurcations within normal limits. Distal MCA branches symmetric bilaterally.  POSTERIOR CIRCULATION:  Left vertebral artery is dominant and widely patent. The diminutive right vertebral artery is grossly patent  but not well evaluated. Vertebrobasilar junction and basilar artery within normal limits. Superior cerebral arteries patent bilaterally. Posterior cerebral arteries well opacified without hemodynamically significant stenosis.  IMPRESSION: MRI HEAD IMPRESSION:  1. No acute intracranial infarct or other abnormality identified. 2. Atrophy with chronic small vessel ischemic disease, stable from prior. 3. Remote right cerebellar infarct.  MRA HEAD IMPRESSION:  1. Dominant left vertebral artery with poorly visualized right vertebral artery, which may be occluded or partially occluded proximally within the neck. 2. No other proximal branch occlusion or hemodynamically significant stenosis identified within the intracranial circulation.   Electronically Signed   By: Jeannine Boga M.D.   On: 05/02/2014 03:05   Mr Jodene Nam Head/brain Wo Cm  05/02/2014   CLINICAL DATA:  Initial evaluation for worsening gait, altered mental status. History of stroke.  EXAM: MRI HEAD WITHOUT CONTRAST  MRA HEAD WITHOUT CONTRAST  TECHNIQUE: Multiplanar, multiecho pulse sequences of the brain and surrounding structures were obtained without intravenous contrast. Angiographic images of the head were obtained using MRA technique without contrast.  COMPARISON:  Prior CT from 04/30/2014 as well as MRI from 04/13/2014.  FINDINGS: MRI HEAD FINDINGS  Diffuse prominence of the CSF containing spaces is compatible with generalized cerebral atrophy. Patchy T2/FLAIR hyperintensity within the periventricular and deep white matter is most consistent with moderate chronic small vessel ischemic disease. Patchy small vessel changes present within the pons as well.  No focal parenchymal signal abnormality is identified. No mass lesion, midline shift, or extra-axial fluid collection. Ventricles are normal in size without evidence of hydrocephalus.  No diffusion-weighted signal abnormality is identified to suggest acute intracranial infarct. Gray-white matter  differentiation is maintained. Normal flow voids are seen within the intracranial vasculature. No intracranial hemorrhage identified.  Encephalomalacia within the right cerebral hemisphere again noted, compatible with remote right pica territory infarct. Small remote lacunar infarcts present within the basal ganglia bilaterally.  The cervicomedullary junction is normal. Pituitary gland is within normal limits. Pituitary stalk is midline. The globes and optic nerves demonstrate a normal appearance with normal signal intensity.  The bone marrow signal intensity is normal. Calvarium is intact. Visualized upper cervical spine is within normal limits.  Scalp soft tissues are unremarkable.  Paranasal sinuses are clear.  No mastoid effusion.  MRA  HEAD FINDINGS  ANTERIOR CIRCULATION:  Distal cervical segments of the internal carotid arteries are widely patent with antegrade flow. Petrous, cavernous, and supra clinoid segments are widely patent bilaterally without hemodynamically significant stenosis. Right A1 segment is mildly hypoplastic. Anterior communicating artery and anterior cerebral arteries within normal limits.  The M1 segments well opacified without proximal branch occlusion or hemodynamically significant stenosis. MCA bifurcations within normal limits. Distal MCA branches symmetric bilaterally.  POSTERIOR CIRCULATION:  Left vertebral artery is dominant and widely patent. The diminutive right vertebral artery is grossly patent but not well evaluated. Vertebrobasilar junction and basilar artery within normal limits. Superior cerebral arteries patent bilaterally. Posterior cerebral arteries well opacified without hemodynamically significant stenosis.  IMPRESSION: MRI HEAD IMPRESSION:  1. No acute intracranial infarct or other abnormality identified. 2. Atrophy with chronic small vessel ischemic disease, stable from prior. 3. Remote right cerebellar infarct.  MRA HEAD IMPRESSION:  1. Dominant left vertebral artery  with poorly visualized right vertebral artery, which may be occluded or partially occluded proximally within the neck. 2. No other proximal branch occlusion or hemodynamically significant stenosis identified within the intracranial circulation.   Electronically Signed   By: Jeannine Boga M.D.   On: 05/02/2014 03:05   EEG: This normal EEG is recorded in the waking and sleep state. There was no seizure or seizure predisposition recorded on this study.   Carotid doppler 04-14-2014: Summary: Right: 1-39% ICA stenosis. . Left: 60-79% ICA  Vertebral artery flow is antegrade with low velocities noted in the right vertebral artery. . Mild ECA stenosis.  Echo: 04-13-2014 Impressions:  - Mild LVH with LVEF 65-70%, indeterminate diastolic function. Mild left atrial enlargement. Unable to assess PASP. No obvious PFO or ASD.   Medications:  Scheduled: . aspirin EC  325 mg Oral Daily  . atorvastatin  40 mg Oral q1800  . divalproex  1,750 mg Oral QPM  . enoxaparin (LOVENOX) injection  30 mg Subcutaneous Q24H  . FLUoxetine  60 mg Oral Daily  . mirtazapine  45 mg Oral QHS  . pantoprazole  40 mg Oral Daily  . salsalate  1,500 mg Oral BID    Assessment/Plan:  68 YO male with confusion and hypotension. Currently confusion has resolved but slurred speeches is still present. CT head shows no acute infarct, MRI is normal with exception of old cerebellar infarct. TSH, B12, VPA and ammonia within normal limits. Orthostatics are normal. Blood pressures have been normalized. EEG  Shows no epileptiform activity. Etiology of dysarthria not  clear, however neurology work up is negative.   Recommend: 1) [facility] or [facility].  2) No further in patient neurology work up recommended. S/O    Etta Quill PA-C [facility] [phone]  05/02/2014, 10:23 AM

## 2014-05-02 NOTE — Progress Notes (Signed)
Physical Therapy Treatment Patient Details Name: Jason [name] MRN: [id number] DOB: [birth year] Today's Date: 05/02/2014    History of Present Illness 68 y.o. male admitted 04/30/14 with confusion and dysarthria. 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 CT/CTA were negative for acute infarct. MRI pending.     PT Comments    Patient progressing well with mobility. Tolerated stair negotiation with min guard A for safety and cues for safe technique. Unsteady during gait training without use of RW. Encouraged pt to use RW at all times during mobility to decrease fall risk. Recommend 24/7 supervision at home due to impaired safety awareness. Will continue to follow and progress as tolerated.   Follow Up Recommendations  Home health PT;Supervision/Assistance - 24 hour     Equipment Recommendations  None recommended by PT    Recommendations for Other Services       Precautions / Restrictions Precautions Precautions: Fall Precaution Comments: mild balance deficits Restrictions Weight Bearing Restrictions: No    Mobility  Bed Mobility Overal bed mobility: Modified Independent             General bed mobility comments: HOB elevated, use of rails.  Transfers Overall transfer level: Needs assistance Equipment used: Rolling walker (2 wheeled);None Transfers: Sit to/from Stand Sit to Stand: Min guard         General transfer comment: Min guard for safety.   Ambulation/Gait Ambulation/Gait assistance: Min guard Ambulation Distance (Feet): 75 Feet Assistive device: Rolling walker (2 wheeled) Gait Pattern/deviations: Step-through pattern;Decreased stride length;Shuffle;Trunk flexed Gait velocity: decreased   General Gait Details: Demonstrates more steady gait pattern today with use of RW. Ambulated to bathroom without RW- unsteady, grabbing onto furniture/door for support and 1 almost LOB when exiting  door.    Stairs Stairs: Yes Stairs assistance: Min guard Stair Management: Step to pattern;One rail Right Number of Stairs: 11 (+3+3) General stair comments: Use of BUEs on 1 rail for support. Min guard for safety/balance and technique.   Wheelchair Mobility    Modified Rankin (Stroke Patients Only)       Balance Overall balance assessment: Needs assistance Sitting-balance support: No upper extremity supported;Feet supported Sitting balance-[name]: Good Sitting balance - Comments: Pt reported being dizzy at EOB   Standing balance support: During functional activity Standing balance-[name]: Fair Standing balance comment: Tolerated static standing when urinating in bathroom for short periods however requires UE support during dynamic standing/gait for safety/balance.                     Cognition Arousal/Alertness: Awake/alert Behavior During Therapy: Flat affect Overall Cognitive Status: Impaired/Different from baseline Area of Impairment: Safety/judgement;Problem solving   Current Attention Level: Sustained Memory: Decreased short-term memory   Safety/Judgement: Decreased awareness of safety Awareness: Intellectual Problem Solving: Requires verbal cues General Comments: When asked about incr sleeping, pt unsure if this was unusual for him and looked to wife to answer. Pt very lethargic on arrival, but became more alert once EOB and ambulating. Once pt returned to bed, he fell asleep within 1-2 minutes    Exercises      General Comments        Pertinent Vitals/Pain Pain Assessment: No/denies pain Pain Score: 2  Pain Location: heartburn Pain Descriptors / Indicators: Burning Pain Intervention(s): Monitored during session;Repositioned    Home Living  Prior Function            PT Goals (current goals can now be found in the care plan section) Acute Rehab PT Goals Patient Stated Goal: none stated Progress  towards PT goals: Progressing toward goals    Frequency  Min 4X/week    PT Plan Current plan remains appropriate    Co-evaluation             End of Session Equipment Utilized During Treatment: Gait belt Activity Tolerance: Patient tolerated treatment well Patient left: in bed;with call bell/phone within reach;with bed alarm set;with family/visitor present     Time: 0867-6195 PT Time Calculation (min): 25 min  Charges:  $Gait Training: 23-37 mins                    G CodesCandy Sledge A 05-24-14, 4:35 PM Candy Sledge, [place], DPT [phone]

## 2014-05-02 NOTE — Progress Notes (Signed)
PT Cancellation Note  Patient Details Name: Jason [name] MRN: [id number] DOB: 11/01/1945   Cancelled Treatment:    Reason Eval/Treat Not Completed: Patient declined, no reason specified pt declined participating in therapy this AM reporting he was tired. Will follow up in PM if time allows or next available time.   Candy Sledge A 05/02/2014, 11:04 AM Candy Sledge, PT, DPT [phone]

## 2014-05-03 DIAGNOSIS — R531 Weakness: Secondary | ICD-10-CM

## 2014-05-03 DIAGNOSIS — I9589 Other hypotension: Secondary | ICD-10-CM

## 2014-05-03 LAB — VITAMIN B1: Vitamin B1 (Thiamine): 9 nmol/L (ref 8–30)

## 2014-05-03 MED ORDER — LISINOPRIL 10 MG PO TABS: 10.0000 mg | ORAL_TABLET | Freq: Every day | ORAL | Status: DC

## 2014-05-03 NOTE — Discharge Summary (Signed)
Physician Discharge Summary  Jason [name] QIW:[id number] DOB: 04/30/1946 DOA: 04/30/2014  PCP: Verline Lema, MD  Admit date: 04/30/2014 Discharge date: 05/03/2014  Time spent: 35 minutes  Recommendations for Outpatient Follow-up:  1. Patient was set up with [facility] for home PT on discharge 2. Please follow up on blood pressures  Discharge Diagnoses:  Principal Problem:   Dysarthria Active Problems:   Bipolar 1 disorder   HTN (hypertension)   AKI (acute kidney injury)   Hypotension   Fall   Weakness   Discharge Condition: Stable/Improved  Diet recommendation: Heart Healthy   Filed Weights   04/30/14 2031  Weight: 87.998 kg (194 lb)    History of present illness:  Jason [name] is a 68 y.o. 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 When her daughter came home at about 12:30 in the night, he was sitting in chair, shaky and very tired with difficulty in speaking. Patient is unsure if he lost consciousness. EMS was called and found him with flaccid weakness in the left side, having speech difficulty, and with SBP in the low 80's. Patient has a mild headache. No vision or hearing loss. He does not have fever, chills, chest pain, shortness of breath, nausea, vomiting, abdominal pain, diarrhea.   Hospital Course:  Patient is a pleasant 68 year old gentleman with a past medical history hypertension, history of cerebellar infarct with left-sided residual weakness, admitted to the medicine service on 04/30/2014 presented with acute encephalopathy, failure to thrive and functional decline. CT scan of brain without contrast performed on admission showed prior infarct in the posterior inferior  right cerebellum which was stable. No new infarct was noted. Patient was seen and evaluated by neurology. He remains stable, MRI of brain showed no acute intracranial infarct or other abnormality identified.  1. Acute encephalopathy. -Patient having a steep functional decline/failure to thrive, and becoming increasing confused.  -Continues to have dysarthria and generalized weakness.  -MRI did not reveal acute intracranial infarct or other abnormality identified  -EEG unremarkable  -Occupational therapy and physical therapy recommending home health services.  -Continue aspirin 325 mg by mouth daily and statin therapy  -Unclear what may have caused encephalopathy, case discussed with neurology had no further recommendations.  -He was set up to follow up with [facility] next week on 05/07/2014 2. Hypertension.  -There were concerns for hypotension for which antihypertensives were held initially.  -Was discharged on lisinopril 10 mg PO q daily 3. Dyslipidemia  -Continue Lipitor 40 mg by mouth daily  4. Bipolar disorder  -Continue Depakote   Consultations:  Neurology  Discharge Exam: Filed Vitals:   05/03/14 0950  BP: 154/79  Pulse: 88  Temp: 97.9 F (36.6 C)  Resp: 20    General: Patient is in no acute distress, awake, following commands, he ambulated with walker  Cardiovascular: Regular rate and rhythm normal S1-S2  Respiratory: Clear to auscultation bilaterally  Abdomen: Soft nontender nondistended positive bowel sounds  Musculoskeletal: Preserved range of motion 12 extremities Neurological. He has 4-5 muscle strength to left upper and left lower extremities, 5 of 5 strength to right upper and lower extremities, sensation was intact   Discharge Instructions You were cared for by a hospitalist during your hospital stay. If  you have any questions about your discharge medications or the care you received while you were in the hospital after you are discharged, you can  call the unit and asked to speak with the hospitalist on call if the hospitalist that took care of you is not available. Once you are discharged, your primary care physician will handle any further medical issues. Please note that NO REFILLS for any discharge medications will be authorized once you are discharged, as it is imperative that you return to your primary care physician (or establish a relationship with a primary care physician if you do not have one) for your aftercare needs so that they can reassess your need for medications and monitor your lab values.  Discharge Instructions   Call MD for:  difficulty breathing, headache or visual disturbances    Complete by:  As directed      Call MD for:  extreme fatigue    Complete by:  As directed      Call MD for:  hives    Complete by:  As directed      Call MD for:  persistant dizziness or light-headedness    Complete by:  As directed      Call MD for:  persistant nausea and vomiting    Complete by:  As directed      Call MD for:  redness, tenderness, or signs of infection (pain, swelling, redness, odor or green/yellow discharge around incision site)    Complete by:  As directed      Call MD for:  severe uncontrolled pain    Complete by:  As directed      Call MD for:  temperature >100.4    Complete by:  As directed      Diet - low sodium heart healthy    Complete by:  As directed      Increase activity slowly    Complete by:  As directed           Current Discharge Medication List    START taking these medications   Details  lisinopril (PRINIVIL,ZESTRIL) 10 MG tablet Take 1 tablet (10 mg total) by mouth daily. Qty: 30 tablet, Refills: 1      CONTINUE these medications which have NOT CHANGED   Details  aspirin EC 325 MG tablet Take 325 mg by mouth daily.    atorvastatin (LIPITOR) 40 MG tablet Take 1 tablet (40 mg total) by mouth daily at 6 PM. Qty: 30 tablet, Refills: 0    divalproex (DEPAKOTE ER) 250 MG 24 hr tablet Take  1,750 mg by mouth every evening.    FLUoxetine (PROZAC) 20 MG capsule Take 60 mg by mouth daily.    glucosamine-chondroitin 500-400 MG tablet Take 1 tablet by mouth 2 (two) times daily.     Magnesium Hydroxide (MAGNESIA PO) Take 1 tablet by mouth daily as needed (for headache).    mirtazapine (REMERON) 45 MG tablet Take 45 mg by mouth at bedtime.    omeprazole (PRILOSEC) 20 MG capsule Take 40 mg by mouth daily.    salsalate (DISALCID) 750 MG tablet Take 1,500 mg by mouth 2 (two) times daily.      STOP taking these medications     lisinopril-hydrochlorothiazide (PRINZIDE,ZESTORETIC) 20-12.5 MG per tablet        Allergies  Allergen Reactions  . Penicillins     Unknown reaction   Follow-up Information   Follow up with Melvenia Beam, MD On 05/07/2014. (at 1 PM be  there at 12:30,  Co pay will be 35$)    Specialty:  Neurology   Contact information:   [place] [address] [phone]       Follow up with PERRY, ANGELA L, MD In 2 weeks.   Specialty:  Internal Medicine   Contact information:   [address] [place] [address] [phone]        The results of significant diagnostics from this hospitalization (including imaging, microbiology, ancillary and laboratory) are listed below for reference.    Significant Diagnostic Studies: Ct Angio Head W/cm &/or Wo Cm  04/13/2014   CLINICAL DATA:  Code stroke.  EXAM: CT ANGIOGRAPHY HEAD  TECHNIQUE: Multidetector CT imaging of the head was performed using the standard protocol during bolus administration of intravenous contrast. Multiplanar CT image reconstructions and MIPs were obtained to evaluate the vascular anatomy.  CONTRAST:  30mL OMNIPAQUE IOHEXOL 350 MG/ML SOLN  COMPARISON:  Prior noncontrast head CT performed earlier on the same day.  FINDINGS: ANTERIOR CIRCULATION:  Visualized distal cervical segments of the internal carotid arteries are widely patent. Petrous segments within normal limits.  Irregular calcified atheromatous plaque present throughout the cavernous segments of the internal carotid arteries bilaterally with associated multi focal stenoses of approximately 30-40%, slightly worse on the left. Supra clinoid segments well opacified.  A1 segments, anterior communicating artery, and anterior cerebral arteries are well opacified.  M1 segments are widely patent without hemodynamically significant stenosis or proximal branch occlusion. MCA bifurcations and distal MCA branches within normal limits.  POSTERIOR CIRCULATION:  Left vertebral artery is dominant. Scattered atherosclerotic plaque present within the distal left vertebral artery without significant stenosis. Posterior inferior cerebral arteries grossly patent. Vertebrobasilar junction and basilar artery are within normal limits. There is mild ectasia of the basilar tip without focal aneurysm. Posterior cerebral arteries are patent bilaterally. Superior cerebellar arteries within normal limits. Proximal anterior inferior cerebral arteries are patent bilaterally.  Atrophy with chronic microvascular ischemic disease again noted. Chronic right cerebellar infarct again noted. No acute intracranial hemorrhage. No abnormal enhancement seen on delayed sequence.  IMPRESSION: 1. No proximal branch occlusion identified within the intracranial circulation. 2. Moderate multi focal atherosclerotic irregularity within the cavernous segments of the internal carotid arteries bilaterally. 3. Mild ectasia of the basilar tip without focal aneurysm. Results were called by telephone at the time of interpretation on 04/13/2014 at 3:01 am to Dr. Dorian Pod , who verbally acknowledged these results.   Electronically Signed   By: Jeannine Boga M.D.   On: 04/13/2014 03:05   Ct Head (brain) Wo Contrast  04/30/2014   CLINICAL DATA:  Dizziness for 3 days. Left-sided weakness from prior CVA which patient reports is becoming more pronounced. Dysarthria   EXAM: CT HEAD WITHOUT CONTRAST  TECHNIQUE: Contiguous axial images were obtained from the base of the skull through the vertex without intravenous contrast.  COMPARISON:  Head CT and brain MRI April 13, 2014  FINDINGS: There is moderate generalized atrophy, stable. There is no intracranial mass, hemorrhage, extra-axial fluid collection, or midline shift. There is evidence of an old infarct in the posterior inferior cerebellum, stable. There is patchy small vessel disease in the centra semiovale bilaterally, stable. There are small lacunar type infarcts in the anterior limb of each internal capsule. There is no new gray-white compartment lesion. No acute infarct apparent.  The bony calvarium appears intact. The mastoid air cells are clear. There is mild mucosal thickening in the inferior maxillary antra bilaterally.  IMPRESSION: Atrophy with small vessel disease. Prior infarct in the posterior inferior right cerebellum, stable. Small lacunar infarcts in each internal capsule, stable. No intracranial mass, hemorrhage, or acute appearing infarct. Mild inferior maxillary sinus disease bilaterally.   Electronically Signed   By: Lowella Grip M.D.   On: 04/30/2014 15:16   Ct Head (brain) Wo Contrast  04/13/2014   CLINICAL DATA:  Code stroke. Left-sided weakness, ataxia and difficulty speaking. Initial encounter.  EXAM: CT HEAD WITHOUT CONTRAST  TECHNIQUE: Contiguous axial images were obtained from the base of the skull through the vertex without intravenous contrast.  COMPARISON:  CT of the head performed 02/12/2013  FINDINGS: There is no evidence of acute infarction, mass lesion, or intra- or extra-axial hemorrhage on CT.  Prominence of the ventricles and sulci reflects moderate cortical volume loss. Diffuse periventricular white matter change likely reflects small vessel ischemic microangiopathy. A chronic infarct is noted at the right cerebellar hemisphere, with associated encephalomalacia. Mild chronic  ischemic change is noted at the basal ganglia bilaterally.  The brainstem and fourth ventricle are within normal limits. The cerebral hemispheres demonstrate grossly normal gray-white differentiation. No mass effect or midline shift is seen.  There is no evidence of fracture; visualized osseous structures are unremarkable in appearance. The visualized portions of the orbits are within normal limits. The paranasal sinuses and mastoid air cells are well-aerated. No significant soft tissue abnormalities are seen.  IMPRESSION: 1. No acute intracranial pathology seen on CT. 2. Moderate cortical volume loss and diffuse small vessel ischemic microangiopathy. 3. Chronic infarct at the right cerebellar hemisphere; mild chronic ischemic change at the basal ganglia bilaterally.  These results were called by telephone at the time of interpretation on 04/13/2014 at 1:52 am to Dr. Armida Sans, who verbally acknowledged these results.   Electronically Signed   By: Garald Balding M.D.   On: 04/13/2014 01:52   Mri Brain Without Contrast  05/02/2014   CLINICAL DATA:  Initial evaluation for worsening gait, altered mental status. History of stroke.  EXAM: MRI HEAD WITHOUT CONTRAST  MRA HEAD WITHOUT CONTRAST  TECHNIQUE: Multiplanar, multiecho pulse sequences of the brain and surrounding structures were obtained without intravenous contrast. Angiographic images of the head were obtained using MRA technique without contrast.  COMPARISON:  Prior CT from 04/30/2014 as well as MRI from 04/13/2014.  FINDINGS: MRI HEAD FINDINGS  Diffuse prominence of the CSF containing spaces is compatible with generalized cerebral atrophy. Patchy T2/FLAIR hyperintensity within the periventricular and deep white matter is most consistent with moderate chronic small vessel ischemic disease. Patchy small vessel changes present within the pons as well.  No focal parenchymal signal abnormality is identified. No mass lesion, midline shift, or extra-axial fluid  collection. Ventricles are normal in size without evidence of hydrocephalus.  No diffusion-weighted signal abnormality is identified to suggest acute intracranial infarct. Gray-white matter differentiation is maintained. Normal flow voids are seen within the intracranial vasculature. No intracranial hemorrhage identified.  Encephalomalacia within the right cerebral hemisphere again noted, compatible with remote right pica territory infarct. Small remote lacunar infarcts present within the basal ganglia bilaterally.  The cervicomedullary junction is normal. Pituitary gland is within normal limits. Pituitary stalk is midline. The globes and optic nerves demonstrate a normal appearance with normal signal intensity.  The bone marrow signal intensity is normal. Calvarium is intact. Visualized upper cervical spine is within normal limits.  Scalp soft tissues are unremarkable.  Paranasal sinuses are clear.  No mastoid effusion.  MRA HEAD FINDINGS  ANTERIOR  CIRCULATION:  Distal cervical segments of the internal carotid arteries are widely patent with antegrade flow. Petrous, cavernous, and supra clinoid segments are widely patent bilaterally without hemodynamically significant stenosis. Right A1 segment is mildly hypoplastic. Anterior communicating artery and anterior cerebral arteries within normal limits.  The M1 segments well opacified without proximal branch occlusion or hemodynamically significant stenosis. MCA bifurcations within normal limits. Distal MCA branches symmetric bilaterally.  POSTERIOR CIRCULATION:  Left vertebral artery is dominant and widely patent. The diminutive right vertebral artery is grossly patent but not well evaluated. Vertebrobasilar junction and basilar artery within normal limits. Superior cerebral arteries patent bilaterally. Posterior cerebral arteries well opacified without hemodynamically significant stenosis.  IMPRESSION: MRI HEAD IMPRESSION:  1. No acute intracranial infarct or other  abnormality identified. 2. Atrophy with chronic small vessel ischemic disease, stable from prior. 3. Remote right cerebellar infarct.  MRA HEAD IMPRESSION:  1. Dominant left vertebral artery with poorly visualized right vertebral artery, which may be occluded or partially occluded proximally within the neck. 2. No other proximal branch occlusion or hemodynamically significant stenosis identified within the intracranial circulation.   Electronically Signed   By: Jeannine Boga M.D.   On: 05/02/2014 03:05   Mr Brain Wo Contrast  04/13/2014   CLINICAL DATA:  68 year old male found down with flaccid weakness on the left and abnormal speech. Hypotension with systolic blood pressure of 80 mmHg. Initial encounter.  EXAM: MRI HEAD WITHOUT CONTRAST  TECHNIQUE: Multiplanar, multiecho pulse sequences of the brain and surrounding structures were obtained without intravenous contrast.  COMPARISON:  CTA head 0218 hr the same day. Head CT without contrast 0151 hrs.  FINDINGS: Chronic right PICA cerebellar infarct with encephalomalacia.  No restricted diffusion to suggest acute infarction. No midline shift, mass effect, evidence of mass lesion, ventriculomegaly, extra-axial collection or acute intracranial hemorrhage. Cervicomedullary junction and pituitary are within normal limits. Negative visualized cervical spine. Major intracranial vascular flow voids are preserved.  Widespread patchy cerebral white matter T2 and FLAIR hyperintensity. Summed superimposed perivascular spaces but some areas also most suggestive of chronic white matter lacunar infarcts. Moderate patchy T2 and FLAIR hyperintensity in the pons.  Visible internal auditory structures appear normal. Mastoids are clear. Minor paranasal sinus mucosal thickening. Visualized orbit soft tissues are within normal limits. Visualized scalp soft tissues are within normal limits. Normal bone marrow signal.  IMPRESSION: 1.  No acute intracranial abnormality. 2. Chronic  small and medium-sized vessel ischemia.   Electronically Signed   By: Lars Pinks M.D.   On: 04/13/2014 16:28   Mr Jodene Nam Head/brain Wo Cm  05/02/2014   CLINICAL DATA:  Initial evaluation for worsening gait, altered mental status. History of stroke.  EXAM: MRI HEAD WITHOUT CONTRAST  MRA HEAD WITHOUT CONTRAST  TECHNIQUE: Multiplanar, multiecho pulse sequences of the brain and surrounding structures were obtained without intravenous contrast. Angiographic images of the head were obtained using MRA technique without contrast.  COMPARISON:  Prior CT from 04/30/2014 as well as MRI from 04/13/2014.  FINDINGS: MRI HEAD FINDINGS  Diffuse prominence of the CSF containing spaces is compatible with generalized cerebral atrophy. Patchy T2/FLAIR hyperintensity within the periventricular and deep white matter is most consistent with moderate chronic small vessel ischemic disease. Patchy small vessel changes present within the pons as well.  No focal parenchymal signal abnormality is identified. No mass lesion, midline shift, or extra-axial fluid collection. Ventricles are normal in size without evidence of hydrocephalus.  No diffusion-weighted signal abnormality is identified to suggest acute intracranial infarct.  Gray-white matter differentiation is maintained. Normal flow voids are seen within the intracranial vasculature. No intracranial hemorrhage identified.  Encephalomalacia within the right cerebral hemisphere again noted, compatible with remote right pica territory infarct. Small remote lacunar infarcts present within the basal ganglia bilaterally.  The cervicomedullary junction is normal. Pituitary gland is within normal limits. Pituitary stalk is midline. The globes and optic nerves demonstrate a normal appearance with normal signal intensity.  The bone marrow signal intensity is normal. Calvarium is intact. Visualized upper cervical spine is within normal limits.  Scalp soft tissues are unremarkable.  Paranasal sinuses  are clear.  No mastoid effusion.  MRA HEAD FINDINGS  ANTERIOR CIRCULATION:  Distal cervical segments of the internal carotid arteries are widely patent with antegrade flow. Petrous, cavernous, and supra clinoid segments are widely patent bilaterally without hemodynamically significant stenosis. Right A1 segment is mildly hypoplastic. Anterior communicating artery and anterior cerebral arteries within normal limits.  The M1 segments well opacified without proximal branch occlusion or hemodynamically significant stenosis. MCA bifurcations within normal limits. Distal MCA branches symmetric bilaterally.  POSTERIOR CIRCULATION:  Left vertebral artery is dominant and widely patent. The diminutive right vertebral artery is grossly patent but not well evaluated. Vertebrobasilar junction and basilar artery within normal limits. Superior cerebral arteries patent bilaterally. Posterior cerebral arteries well opacified without hemodynamically significant stenosis.  IMPRESSION: MRI HEAD IMPRESSION:  1. No acute intracranial infarct or other abnormality identified. 2. Atrophy with chronic small vessel ischemic disease, stable from prior. 3. Remote right cerebellar infarct.  MRA HEAD IMPRESSION:  1. Dominant left vertebral artery with poorly visualized right vertebral artery, which may be occluded or partially occluded proximally within the neck. 2. No other proximal branch occlusion or hemodynamically significant stenosis identified within the intracranial circulation.   Electronically Signed   By: Jeannine Boga M.D.   On: 05/02/2014 03:05    Microbiology: No results found for this or any previous visit (from the past 240 hour(s)).   Labs: Basic Metabolic Panel:  Recent Labs Lab 04/30/14 1337 04/30/14 1344 04/30/14 2142  NA 146 144  --   K 4.1 4.0  --   CL 105 106  --   CO2 27  --   --   GLUCOSE 84 83  --   BUN 22 23  --   CREATININE 1.65* 1.90* 1.35  CALCIUM 9.0  --   --    Liver Function  Tests:  Recent Labs Lab 04/30/14 1337  AST 15  ALT 8  ALKPHOS 121*  BILITOT <0.2*  PROT 6.9  ALBUMIN 3.4*   No results found for this basename: LIPASE, AMYLASE,  in the last 168 hours  Recent Labs Lab 04/30/14 1738  AMMONIA 42   CBC:  Recent Labs Lab 04/30/14 1337 04/30/14 1344 04/30/14 2142  WBC 7.9  --  6.8  NEUTROABS 5.6  --   --   HGB 9.8* 10.5* 9.1*  HCT 31.6* 31.0* 30.0*  MCV 90.3  --  89.3  PLT 144*  --  157   Cardiac Enzymes: No results found for this basename: CKTOTAL, CKMB, CKMBINDEX, TROPONINI,  in the last 168 hours BNP: BNP (last 3 results) No results found for this basename: PROBNP,  in the last 8760 hours CBG:  Recent Labs Lab 05/01/14 2236  GLUCAP 79       Signed:  Deneice Wack  [facility] 05/03/2014, 1:16 PM

## 2014-05-03 NOTE — Progress Notes (Signed)
Talked to patient about [facility]/ [place] choices, patient requested [facility] and wanted Merrilee Seashore as his physical therapist; Amy with [facility] called for arrangements; Aneta Mins [phone]

## 2014-05-03 NOTE — Progress Notes (Signed)
PT Cancellation Note  Patient Details Name: Toretto Tingler MRN: [id number] DOB: July 07, 1945   Cancelled Treatment:    Reason Eval/Treat Not Completed: Patient declined, no reason specified Pt getting ready to be discharged from hospital. Declined therapy due to going home. Will defer to HHPT. Will follow up if still in hospital next date.   Candy Sledge A 05/03/2014, 1:44 PM Candy Sledge, [place], DPT [phone]

## 2014-05-03 NOTE — Progress Notes (Signed)
Discharge orders received, pt for discharge home today, Telemetry and IV D/C,   D/C instructions and Rx given with verbalized understanding.  Family at bedside to assist pt with discharge. Staff brought pt downstairs via wheelchair.

## 2014-05-03 NOTE — Progress Notes (Signed)
Occupational Therapy Treatment Patient Details Name: Jason [name] MRN: [id number] DOB: 06/01/1946 Today's Date: 05/03/2014    History of present illness 68 y.o. male admitted 04/30/14 with confusion and dysarthria. 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 CT/CTA were negative for acute infarct. MRI pending.    OT comments  Pt seen today to address safety with ADLs. Pt reports he is d/c home today with wife Supervision. Pt overall requires supervision for ADLs and functional mobility, however requires VC's for safety and presents with continued decreased cognition and short term memory. Pt's needs will be addressed further in next venue of care ([place]). Acute OT to sign off.    Follow Up Recommendations  Home health OT;Supervision/Assistance - 24 hour    Equipment Recommendations       Recommendations for Other Services      Precautions / Restrictions Precautions Precautions: Fall Precaution Comments: mild balance deficits Restrictions Weight Bearing Restrictions: No       Mobility Bed Mobility Overal bed mobility: Modified Independent             General bed mobility comments: HOB flat, no use of rails  Transfers Overall transfer level: Needs assistance Equipment used: Rolling walker (2 wheeled);None Transfers: Sit to/from Stand Sit to Stand: Supervision         General transfer comment: Supervision for safety and VC's for hand placement.     Balance Overall balance assessment: Needs assistance Sitting-balance support: No upper extremity supported;Feet supported Sitting balance-[name]: Good     Standing balance support: No upper extremity supported;During functional activity Standing balance-[name]: Fair Standing balance comment: Pt able to perform static standing with no LOB without UE support, however requires UE support on RW for dynamic standing/gait.                     ADL Overall ADL's : Needs assistance/impaired             Lower Body Bathing: Supervison/ safety;Sit to/from stand       Lower Body Dressing: Supervision/safety;Sit to/from stand Lower Body Dressing Details (indicate cue type and reason): pt able to adjust Bil socks sitting EOB using figure four technique.  Toilet Transfer: Supervision/safety;Ambulation;RW             General ADL Comments: Pt overall at Supervision level for ADLs and functional mobility due to cognition and memory as well as mild balance deficits. Pt is alert and appears steady with use of RW for ambulation, however VC's needed for safety and hand placement. Reinforced education on sitting for LB dressing and bathing to decrease fall risk. Left note for pt's wife regarding fall prevention techniques and need for 24/7 Supervision. Pt plans to d/c today.                  Cognition  Arousal/Alertness: Awake/Alert Behavior During Therapy: Flat affect Overall Cognitive Status: Impaired/Different from baseline Area of Impairment: Safety/judgement;Problem solving;Memory;Attention   Current Attention Level: Sustained Memory: Decreased short-term memory    Safety/Judgement: Decreased awareness of safety   Problem Solving: Requires verbal cues General Comments: Pt more alert today, however continues to present with decreased memory and cognition.                  Pertinent Vitals/ Pain       Pain Assessment: No/denies pain         Frequency Min 2X/week  Progress Toward Goals  OT Goals(current goals can now be found in the care plan section)  Progress towards OT goals: Progressing toward goals     Plan Discharge plan remains appropriate       End of Session Equipment Utilized During Treatment: Rolling walker   Activity Tolerance Patient tolerated treatment well   Patient Left in bed;with call bell/phone within reach;with bed alarm set   Nurse Communication          Time:  8871-9597 OT Time Calculation (min): 14 min  Charges: OT General Charges $OT Visit: 1 Procedure OT Treatments $Self Care/Home Management : 8-22 mins  Cyndie Chime, OTR/L Occupational Therapist [phone] (pager)   Villa Herb M 05/03/2014, 11:11 AM

## 2014-05-07 ENCOUNTER — Ambulatory Visit (INDEPENDENT_AMBULATORY_CARE_PROVIDER_SITE_OTHER): Payer: Medicare Other | Admitting: Neurology

## 2014-05-07 ENCOUNTER — Encounter: Payer: Self-pay | Admitting: Neurology

## 2014-05-07 VITALS — BP 123/76 | HR 96 | Temp 98.0°F | Ht 65.0 in | Wt 185.0 lb

## 2014-05-07 DIAGNOSIS — M545 Low back pain, unspecified: Secondary | ICD-10-CM

## 2014-05-07 DIAGNOSIS — R404 Transient alteration of awareness: Secondary | ICD-10-CM

## 2014-05-07 DIAGNOSIS — F05 Delirium due to known physiological condition: Secondary | ICD-10-CM

## 2014-05-07 DIAGNOSIS — R410 Disorientation, unspecified: Secondary | ICD-10-CM

## 2014-05-07 DIAGNOSIS — R29898 Other symptoms and signs involving the musculoskeletal system: Secondary | ICD-10-CM

## 2014-05-07 NOTE — Progress Notes (Addendum)
[facility]    Provider:  Dr Jaynee Eagles Referring Provider: Verline Lema, MD Primary Care Physician:  Verline Lema, MD  CC:  Right leg pain  HPI:  Jason [name] is a 68 y.o. male here as a referral from Dr. Henrene Pastor for right leg pain. Wife gives most of the information.  [name] is an 68 y.o. male with a past medical history significant for HTN, prostate cancer, cerebellar infarct with mild residual left sided weakness, prostate cancer, bipolar disorder. He has had multiple recent admissions recently first for worsening left-sided weakness and then right-sided weakness. No etiology was found for his complaints. EEGs were negative. MRI of the brain showed no acute intracranial abnormality.  Today he reports that his right leg hurts. Started last night when sleeping. He kept trying to get comfortable in bed. He couldn't get comfortable and got up. The whole leg hurts. It aches. No numbness, no tingling, no sensory changes.  Worsening gait instability. He can't raise the right leg now. Has low back pain. A few days before the last admission he started hydrocodone for his LBP. He is having weakness mostly in the legs, arms seem ok. No neck pain.  He is slow coming down the steps.   Some confusion as well. Confusion has been going on since the beginning of October. No recent seizure-like episodes, loss of consciousness, staring episodes. EEGs have all been negative. But he does report in the hospital he has had involunary jerking of the arms. Wife has noticed twitching of the shoulders and arms when he puts his hands behind his head.  Has had loss of consciousness the last 2 times in the hospital and EEG has been negative.   Wife believe episodes of confusion have coincided with low blood pressures. He continues to have low pressures.Today his diastoli was 51 and he had an irregular rhythm. Does not have a cardiology. He has a PMHx of a "blockage in his heart". He also  has 60-79% ICA carotid stenosis on the left.    He is on Depakote for bipolar disorder. Last levels have been therapeutic.   Reviewed notes, labs and imaging from outside physicians, which showed: Admission October 10th: He said that he was sitting in front of his computer when suddenly developed an odd feeling, then became very sweaty, confused, and lost control of his bladder. He is unsure if he lost consciousness. EMS was summoned and found him with flaccid weakness left side, having speech difficulty, and with SBP in the low 80's, but otherwise able to answer questions appropriately. NIHSS 5. Was readmitted 10/27 for worsening gait, altered mental status, memory problems Discharged and had PT and did well.  LDL 101, HgbA1c 5.6, drug screen +opiates, b12 high, folate and b1 nml, Valproic acid level 64, ammonia nml, tsh nml, CBC with anemia hct 30, CMP with elevated alkp and AKI at both of last 2 admissions. EEG 10/28 and 10/12 were normal.   Carotid doppler 10/11:  Summary: Right: 1-39% ICA stenosis. ICA/CCA ratio is 1.12. Left: 60-79% ICA stenosis. ICA/CCA ratio is 2.63. Bilateral: Vertebral artery flow is antegrade with low velocities noted in the right vertebral artery. . Mild ECA stenosis.  2D echo 10/10: Impressions:  - Mild LVH with LVEF 65-70%, indeterminate diastolic function. Mild left atrial enlargement. Unable to assess PASP. No obvious PFO or ASD.  MRI HEAD  IMPRESSION:  1. No acute intracranial infarct or other abnormality identified. 2. Atrophy with chronic small vessel  ischemic disease, stable from prior. 3. Remote right cerebellar infarct.  CTA:  IMPRESSION: 1. No proximal branch occlusion identified within the intracranial circulation. 2. Moderate multi focal atherosclerotic irregularity within the cavernous segments of the internal carotid arteries bilaterally     Review of Systems: Patient complains of symptoms per HPI as well as the following  symptoms: weight gain, fatigue, blurred vision, swelling in legs, SOB, cough, snoring, increased thirst, flushing, incontinence, cramps, aching muscles, memory loss, confusion, weakness, slurred speech, tremor, snoring, depression, decreased energy, disinterest in activities. Pertinent negatives per HPI. All others negative.   History   Social History  . Marital Status: Married    Spouse Name: N/A    Number of Children: N/A  . Years of Education: N/A   Occupational History  . Not on file.   Social History Main Topics  . Smoking status: Former Smoker    Quit date: 03/06/2003  . Smokeless tobacco: Not on file  . Alcohol Use: No  . Drug Use: No  . Sexual Activity: No   Other Topics Concern  . Not on file   Social History Narrative    Family History  Problem Relation Age of Onset  . Stroke Mother     Hemorrhagic stroke  . Cancer Father     Pancreatic cancer  . Hypertension Father     Past Medical History  Diagnosis Date  . Hypertension   . Stroke   . Prostate cancer   . Depression   . Bipolar 1 disorder   . Acid reflux disease with ulcer     Past Surgical History  Procedure Laterality Date  . Prostate surgery      Current Outpatient Prescriptions  Medication Sig Dispense Refill  . aspirin EC 325 MG tablet Take 325 mg by mouth daily.    Marland Kitchen atorvastatin (LIPITOR) 40 MG tablet Take 1 tablet (40 mg total) by mouth daily at 6 PM. 30 tablet 0  . divalproex (DEPAKOTE ER) 250 MG 24 hr tablet Take 1,750 mg by mouth every evening.    Marland Kitchen FLUoxetine (PROZAC) 20 MG capsule Take 60 mg by mouth daily.    Marland Kitchen glucosamine-chondroitin 500-400 MG tablet Take 1 tablet by mouth 2 (two) times daily.     Marland Kitchen HYDROcodone-acetaminophen (NORCO) 7.5-325 MG per tablet Take 1 tablet by mouth every 6 (six) hours as needed for moderate pain.    Marland Kitchen lisinopril (PRINIVIL,ZESTRIL) 10 MG tablet Take 1 tablet (10 mg total) by mouth daily. 30 tablet 1  . Magnesium Hydroxide (MAGNESIA PO) Take 1 tablet by  mouth daily as needed (for headache).    . mirtazapine (REMERON) 45 MG tablet Take 45 mg by mouth at bedtime.    Marland Kitchen omeprazole (PRILOSEC) 20 MG capsule Take 40 mg by mouth daily.    . salsalate (DISALCID) 750 MG tablet Take 1,500 mg by mouth 2 (two) times daily.     No current facility-administered medications for this visit.    Allergies as of 05/07/2014 - Review Complete 05/07/2014  Allergen Reaction Noted  . Penicillins  11/19/2012    Vitals: BP 123/76 mmHg  Pulse 96  Temp(Src) 98 F (36.7 C) (Oral)  Ht 5\' 5"  (1.651 m)  Wt 185 lb (83.915 kg)  BMI 30.79 kg/m2 Last Weight:  Wt Readings from Last 1 Encounters:  05/07/14 185 lb (83.915 kg)   Last Height:   Ht Readings from Last 1 Encounters:  05/07/14 5\' 5"  (1.651 m)      Physical exam: Exam:  Gen: NAD, conversant, well nourised,  well groomed                     CV: RRR, no MRG.  Eyes: Conjunctivae clear without exudates or hemorrhage  Neuro: Detailed Neurologic Exam. MoCA 23/30.  Speech:    Speech is normal; fluent and spontaneous with normal comprehension.  Cognition:MoCA 23/30    The patient is oriented to person, place, and time;     recent and remote memory impaired;     language fluent;     Impaired attention, concentration,    fund of knowledge Cranial Nerves:    The pupils are equal, round, and reactive to light. The fundi are normal and spontaneous venous pulsations are present. Visual fields are full to finger confrontation. Extraocular movements are intact. Trigeminal sensation is intact and the muscles of mastication are normal. The face is symmetric. The palate elevates in the midline. Voice is normal. Shoulder shrug is normal. The tongue has normal motion without fasciculations.   Coordination:    Normal finger to nose. Cant do right HTS secondary to weakness and/or pain. Slow but no dysmetria.   Gait:    Wide based with a walker  Motor Observation:    No asymmetry, no atrophy, postural  tremor  Tone:    Normal muscle tone.    Posture:    Posture is slightly stooped    Strength:    Strength is V/V in the upper and lower limbs except    Right hip flexion 2/5     Left hip flexion 4/5 with give-way      Right HS 4+      Right quad 4/5      5/5 bilateral DF/PF            Sensation: intact to LT     Reflex Exam:  DTR's:    Deep tendon reflexes in the upper and lower extremities are normal bilaterally.   Toes:    The toes are downgoing bilaterally.   Clonus:    Few beats clonus bilat   Assessment/Plan:  68 year old male with a history of a previous pica infarct with resultant left-sided weakness who presents for follow up of episodic left-sided weakness, confusion in the setting of repeated hypotensive episodes. Would consider pseudoexacerbation of previous stroke symptoms for his left-sided episodes. Intermittent seizures has been a consideration in him during his last 2 admissions but multiple eegs have been normal and depakote level therapeutic(for psychiatric disorder). Appears is also having episodes of loss of consciousness. Needs a cardiology referral for possible syncopal episodes,recurrent severe hypotension and past history of "heart blockage" and carotid stenosis. Would consider possibility of a toxic/metabolic etiologies and  Psychiatric overlay.    Has new onset of right leg pain and weakness. Would recommend not taking Norco given his memory changes and confusion. Will order an MRI of the lumbar spine. Also order an emg/ncs to evaluate bilateral leg weakness.  Continue statin and aspirin for stroke prevention.   Sarina Ill, MD  [facility] [address] [place], [place] [address]  Phone [phone] Fax [phone] Lenor Coffin

## 2014-05-07 NOTE — Patient Instructions (Signed)
Overall you are doing fairly well but I do want to suggest a few things today:   Remember to drink plenty of fluid, eat healthy meals and do not skip any meals. Try to eat protein with a every meal and eat a healthy snack such as fruit or nuts in between meals. Try to keep a regular sleep-wake schedule and try to exercise daily, particularly in the form of walking, 20-30 minutes a day, if you can.   As far as diagnostic testing: MRI of the lumbar spine, prolonged EEG, cardiology consult  I would like to see you back within 2 weeks for EMG/NCS, sooner if we need to. Please call us with any interim questions, concerns, problems, updates or refill requests.   Please also call us for any test results so we can go over those with you on the phone.  My clinical assistant and will answer any of your questions and relay your messages to me and also relay most of my messages to you.   Our phone number is [phone]. We also have an after hours call service for urgent matters and there is a physician on-call for urgent questions. For any emergencies you know to call 911 or go to the nearest emergency room

## 2014-05-08 ENCOUNTER — Encounter: Payer: Self-pay | Admitting: Neurology

## 2014-05-09 ENCOUNTER — Emergency Department (HOSPITAL_COMMUNITY): Payer: Medicare Other

## 2014-05-09 ENCOUNTER — Emergency Department (HOSPITAL_COMMUNITY)
Admission: EM | Admit: 2014-05-09 | Discharge: 2014-05-09 | Disposition: A | Discharge status: Home / Self Care | Payer: Medicare Other | Attending: Emergency Medicine | Admitting: Emergency Medicine

## 2014-05-09 ENCOUNTER — Encounter (HOSPITAL_COMMUNITY): Payer: Self-pay | Admitting: *Deleted

## 2014-05-09 ENCOUNTER — Telehealth: Payer: Self-pay | Admitting: Neurology

## 2014-05-09 DIAGNOSIS — R410 Disorientation, unspecified: Secondary | ICD-10-CM | POA: Diagnosis not present

## 2014-05-09 DIAGNOSIS — Z9889 Other specified postprocedural states: Secondary | ICD-10-CM | POA: Diagnosis not present

## 2014-05-09 DIAGNOSIS — Z7982 Long term (current) use of aspirin: Secondary | ICD-10-CM | POA: Diagnosis not present

## 2014-05-09 DIAGNOSIS — Z8673 Personal history of transient ischemic attack (TIA), and cerebral infarction without residual deficits: Secondary | ICD-10-CM | POA: Insufficient documentation

## 2014-05-09 DIAGNOSIS — R471 Dysarthria and anarthria: Secondary | ICD-10-CM

## 2014-05-09 DIAGNOSIS — K219 Gastro-esophageal reflux disease without esophagitis: Secondary | ICD-10-CM | POA: Insufficient documentation

## 2014-05-09 DIAGNOSIS — Z79899 Other long term (current) drug therapy: Secondary | ICD-10-CM | POA: Diagnosis not present

## 2014-05-09 DIAGNOSIS — Z87891 Personal history of nicotine dependence: Secondary | ICD-10-CM | POA: Diagnosis not present

## 2014-05-09 DIAGNOSIS — Z8546 Personal history of malignant neoplasm of prostate: Secondary | ICD-10-CM | POA: Insufficient documentation

## 2014-05-09 DIAGNOSIS — R32 Unspecified urinary incontinence: Secondary | ICD-10-CM | POA: Diagnosis not present

## 2014-05-09 DIAGNOSIS — I10 Essential (primary) hypertension: Secondary | ICD-10-CM | POA: Diagnosis not present

## 2014-05-09 DIAGNOSIS — R531 Weakness: Secondary | ICD-10-CM | POA: Diagnosis not present

## 2014-05-09 DIAGNOSIS — Z791 Long term (current) use of non-steroidal anti-inflammatories (NSAID): Secondary | ICD-10-CM | POA: Diagnosis not present

## 2014-05-09 LAB — COMPREHENSIVE METABOLIC PANEL
ALBUMIN: 3.1 g/dL — AB (ref 3.5–5.2)
ALT: 6 U/L (ref 0–53)
AST: 16 U/L (ref 0–37)
Alkaline Phosphatase: 105 U/L (ref 39–117)
Anion gap: 18 — ABNORMAL HIGH (ref 5–15)
BUN: 55 mg/dL — ABNORMAL HIGH (ref 6–23)
CO2: 22 mEq/L (ref 19–32)
CREATININE: 2.98 mg/dL — AB (ref 0.50–1.35)
Calcium: 8.7 mg/dL (ref 8.4–10.5)
Chloride: 105 mEq/L (ref 96–112)
GFR calc Af Amer: 23 mL/min — ABNORMAL LOW (ref >90)
GFR calc non Af Amer: 20 mL/min — ABNORMAL LOW (ref >90)
Glucose, Bld: 76 mg/dL (ref 70–99)
POTASSIUM: 4.1 meq/L (ref 3.7–5.3)
Sodium: 145 mEq/L (ref 137–147)
TOTAL PROTEIN: 7.4 g/dL (ref 6.0–8.3)

## 2014-05-09 LAB — CBC WITH DIFFERENTIAL/PLATELET
BASOS ABS: 0 10*3/uL (ref 0.0–0.1)
BASOS PCT: 0 % (ref 0–1)
EOS ABS: 0.1 10*3/uL (ref 0.0–0.7)
Eosinophils Relative: 1 % (ref 0–5)
HCT: 29.1 % — ABNORMAL LOW (ref 39.0–52.0)
Hemoglobin: 8.9 g/dL — ABNORMAL LOW (ref 13.0–17.0)
Lymphocytes Relative: 14 % (ref 12–46)
Lymphs Abs: 1.3 10*3/uL (ref 0.7–4.0)
MCH: 27.4 pg (ref 26.0–34.0)
MCHC: 30.6 g/dL (ref 30.0–36.0)
MCV: 89.5 fL (ref 78.0–100.0)
Monocytes Absolute: 1.4 10*3/uL — ABNORMAL HIGH (ref 0.1–1.0)
Monocytes Relative: 15 % — ABNORMAL HIGH (ref 3–12)
NEUTROS PCT: 70 % (ref 43–77)
Neutro Abs: 6.6 10*3/uL (ref 1.7–7.7)
PLATELETS: 168 10*3/uL (ref 150–400)
RBC: 3.25 MIL/uL — ABNORMAL LOW (ref 4.22–5.81)
RDW: 17 % — AB (ref 11.5–15.5)
WBC: 9.4 10*3/uL (ref 4.0–10.5)

## 2014-05-09 LAB — URINALYSIS, ROUTINE W REFLEX MICROSCOPIC
Bilirubin Urine: NEGATIVE
GLUCOSE, UA: NEGATIVE mg/dL
Hgb urine dipstick: NEGATIVE
KETONES UR: NEGATIVE mg/dL
LEUKOCYTES UA: NEGATIVE
NITRITE: NEGATIVE
PH: 5 (ref 5.0–8.0)
Protein, ur: NEGATIVE mg/dL
Specific Gravity, Urine: 1.023 (ref 1.005–1.030)
Urobilinogen, UA: 0.2 mg/dL (ref 0.0–1.0)

## 2014-05-09 LAB — I-STAT CG4 LACTIC ACID, ED: LACTIC ACID, VENOUS: 0.32 mmol/L — AB (ref 0.5–2.2)

## 2014-05-09 LAB — PROTIME-INR
INR: 1.21 (ref 0.00–1.49)
Prothrombin Time: 15.4 seconds — ABNORMAL HIGH (ref 11.6–15.2)

## 2014-05-09 MED ORDER — SODIUM CHLORIDE 0.9 % IV BOLUS (SEPSIS)
1000.0000 mL | Freq: Once | INTRAVENOUS | Status: AC
  Administered 2014-05-09: 1000 mL via INTRAVENOUS

## 2014-05-09 NOTE — Telephone Encounter (Signed)
Called spouse. Gave instructions per Dr. Cathren Laine last note.  Spouse agreed. Will call ambulance because she is unable to get patient down the stairs. Dr. Jaynee Eagles advised they would have to go through the ED. Spouse verbalized understanding.

## 2014-05-09 NOTE — Telephone Encounter (Signed)
Would you call and let them know they need to go to the ED if he is having significant worsening

## 2014-05-09 NOTE — Discharge Instructions (Signed)
Confusion Confusion is the inability to think with your usual speed or clarity. Confusion may come on quickly or slowly over time. How quickly the confusion comes on depends on the cause. Confusion can be due to any number of causes. CAUSES   Concussion, head injury, or head trauma.  Seizures.  Stroke.  Fever.  Brain tumor.  Age related decreased brain function (dementia).  Heightened emotional states like rage or terror.  Mental illness in which the person loses the ability to determine what is real and what is not (hallucinations).  Infections such as a urinary tract infection (UTI).  Toxic effects from alcohol, drugs, or prescription medicines.  Dehydration and an imbalance of salts in the body (electrolytes).  Lack of sleep.  Low blood sugar (diabetes).  Low levels of oxygen from conditions such as chronic lung disorders.  Drug interactions or other medicine side effects.  Nutritional deficiencies, especially niacin, thiamine, vitamin C, or vitamin B.  Sudden drop in body temperature (hypothermia).  Change in routine, such as when traveling or hospitalized. SIGNS AND SYMPTOMS  People often describe their thinking as cloudy or unclear when they are confused. Confusion can also include feeling disoriented. That means you are unaware of where or who you are. You may also not know what the date or time is. If confused, you may also have difficulty paying attention, remembering, and making decisions. Some people also act aggressively when they are confused.  DIAGNOSIS  The medical evaluation of confusion may include:  Blood and urine tests.  X-rays.  Brain and nervous system tests.  Analyzing your brain waves (electroencephalogram or EEG).  Magnetic resonance imaging (MRI) of your head.  Computed tomography (CT) scan of your head.  Mental status tests in which your health care provider may ask many questions. Some of these questions may seem silly or strange,  but they are a very important test to help diagnose and treat confusion. TREATMENT  An admission to the hospital may not be needed, but a person with confusion should not be left alone. Stay with a family member or friend until the confusion clears. Avoid alcohol, pain relievers, or sedative drugs until you have fully recovered. Do not drive until directed by your health care provider. HOME CARE INSTRUCTIONS  What family and friends can do:  To find out if someone is confused, ask the person to state his or her name, age, and the date. If the person is unsure or answers incorrectly, he or she is confused.  Always introduce yourself, no matter how well the person knows you.  Often remind the person of his or her location.  Place a calendar and clock near the confused person.  Help the person with his or her medicines. You may want to use a pill box, an alarm as a reminder, or give the person each dose as prescribed.  Talk about current events and plans for the day.  Try to keep the environment calm, quiet, and peaceful.  Make sure the person keeps follow-up visits with his or her health care provider. PREVENTION  Ways to prevent confusion:  Avoid alcohol.  Eat a balanced diet.  Get enough sleep.  Take medicine only as directed by your health care provider.  Do not become isolated. Spend time with other people and make plans for your days.  Keep careful watch on your blood sugar levels if you are diabetic. SEEK IMMEDIATE MEDICAL CARE IF:   You develop severe headaches, repeated vomiting, seizures, blackouts, or   slurred speech.  There is increasing confusion, weakness, numbness, restlessness, or personality changes.  You develop a loss of balance, have marked dizziness, feel uncoordinated, or fall.  You have delusions, hallucinations, or develop severe anxiety.  Your family members think you need to be rechecked. Document Released: 07/29/2004 Document Revised: 11/05/2013  Document Reviewed: 07/27/2013 Exit[employer] Patient Information 2015 [employer]. This information is not intended to replace advice given to you by your health care provider. Make sure you discuss any questions you have with your health care provider.  

## 2014-05-09 NOTE — ED Notes (Signed)
Pt arrives from home via [facility] with intermittent slurred speech and bilateral extremity weakness. Pt has a hx of TIA's. Per EMS pt had a negative stroke screen. 90/60. 500cc bolus given 115/56. 18G LAC 93% RA. CBG 94.

## 2014-05-09 NOTE — Telephone Encounter (Signed)
Spoke to physical therapist Amy, she states patient has decline significantly in the last 2 days, he is stumbling and falling, speech is altered-slurred, vital signs were good,  oxygen level ran between 70-80 at first but has increased since being there, patient is having incontinence issues that he does not normally have also. Requesting advice.  Ok to call spouse on mobile number.

## 2014-05-09 NOTE — ED Notes (Signed)
Notified Kel;y, PA that stool sample is unobtainable at this time. Attempted to get sample but pt is clear of stool at lower level.

## 2014-05-09 NOTE — Telephone Encounter (Signed)
Amy, physical therapist from [place], calling to state that patient is not doing well at all and she observes a big decline in patient's condition, please return call and advise.

## 2014-05-09 NOTE — ED Provider Notes (Signed)
CSN: [id number]     Arrival date & time 05/09/14  1913 History   First MD Initiated Contact with Patient 05/09/14 2009     Chief Complaint  Patient presents with  . Extremity Weakness    (Consider location/radiation/quality/duration/timing/severity/associated sxs/prior Treatment) HPI Comments: Patient is a 68 year old male with a history of hypertension, right cerebellar stroke, prostate CA s/p prostate surgery, depression, and bipolar 1 disorder. Patient presents to the emergency department for worsening weakness, dysarthria, and acute confusion. Wife states that symptoms began on 04/08/14 and patient had an episode where he could not move his extremities. She states that the patient was acutely confused at this time. Patient's symptoms resolved within a day or so, but recurred on 04/13/14 at which time patient presented to our ED as a code stroke. Per imaging, no acute stroke appreciated on CT or MR. Wife states that patient has had a waxing and waning disease course since this time. Improvement and worsening usually cycles over every 72 hour period. Wife states that patient has not been able to ambulate at home today. She states that he has "a hard time manipulating his hands" and becomes acutely confused. Patient has seen Dr. Jaynee Eagles of [place] who is planning to have a nerve conduction study completed, per wife. He has been utilizing HHPT with no improvement in his symptoms. Symptoms have been associated with urinary incontinence, mostly while sleeping. Past notes also reference sporadic hypoxia which patient has not exhibited since arrival in the ED. No fevers, chest pain, shortness of breath, or headache.  Patient is a 68 y.o. male presenting with extremity weakness. The history is provided by the patient and the spouse. No language interpreter was used.  Extremity Weakness Associated symptoms include weakness. Pertinent negatives include no chest pain, fever, headaches or vomiting.    Past Medical  History  Diagnosis Date  . Hypertension   . Stroke   . Prostate cancer   . Depression   . Bipolar 1 disorder   . Acid reflux disease with ulcer    Past Surgical History  Procedure Laterality Date  . Prostate surgery     Family History  Problem Relation Age of Onset  . Stroke Mother     Hemorrhagic stroke  . Cancer Father     Pancreatic cancer  . Hypertension Father    History  Substance Use Topics  . Smoking status: Former Smoker    Quit date: 03/06/2003  . Smokeless tobacco: Not on file  . Alcohol Use: No    Review of Systems  Constitutional: Negative for fever.  Eyes: Negative for visual disturbance.  Respiratory: Negative for shortness of breath.   Cardiovascular: Negative for chest pain.  Gastrointestinal: Negative for vomiting and diarrhea.  Genitourinary:       +urinary incontinence  Musculoskeletal: Positive for gait problem and extremity weakness.  Neurological: Positive for speech difficulty and weakness. Negative for headaches.  Psychiatric/Behavioral: Positive for confusion.    Allergies  Penicillins  Home Medications   Prior to Admission medications   Medication Sig Start Date End Date Taking? Authorizing Provider  aspirin EC 325 MG tablet Take 325 mg by mouth daily.   Yes Historical Provider, MD  atorvastatin (LIPITOR) 40 MG tablet Take 1 tablet (40 mg total) by mouth daily at 6 PM. 04/14/14  Yes Maryann Mikhail, DO  divalproex (DEPAKOTE ER) 250 MG 24 hr tablet Take 1,750 mg by mouth every evening.   Yes Historical Provider, MD  FLUoxetine (PROZAC) 20 MG capsule Take  60 mg by mouth daily.   Yes Historical Provider, MD  glucosamine-chondroitin 500-400 MG tablet Take 1 tablet by mouth 2 (two) times daily.    Yes Historical Provider, MD  HYDROcodone-acetaminophen (NORCO) 7.5-325 MG per tablet Take 1 tablet by mouth every 6 (six) hours as needed for moderate pain.   Yes Historical Provider, MD  lisinopril (PRINIVIL,ZESTRIL) 10 MG tablet Take 1 tablet  (10 mg total) by mouth daily. 05/03/14  Yes [name], MD  Magnesium Hydroxide (MAGNESIA PO) Take 1 tablet by mouth daily as needed (for headache).   Yes Historical Provider, MD  mirtazapine (REMERON) 45 MG tablet Take 45 mg by mouth at bedtime.   Yes Historical Provider, MD  omeprazole (PRILOSEC) 20 MG capsule Take 40 mg by mouth daily.   Yes Historical Provider, MD  salsalate (DISALCID) 750 MG tablet Take 1,500 mg by mouth 2 (two) times daily.   Yes Historical Provider, MD   BP 140/97 mmHg  Pulse 87  Temp(Src) 98 F (36.7 C) (Oral)  Resp 28  Wt 185 lb (83.915 kg)  SpO2 93%   Physical Exam  Constitutional: He is oriented to person, place, and time. He appears well-developed and well-nourished. No distress.  Nontoxic/nonseptic appearing. Well nourished.  HENT:  Head: Normocephalic and atraumatic.  Mouth/Throat: No oropharyngeal exudate.  Dry mm  Eyes: Conjunctivae and EOM are normal. Pupils are equal, round, and reactive to light. No scleral icterus.  Pupils equal round and reactive to direct and consensual light.  Neck: Normal range of motion. Neck supple.  Cardiovascular: Normal rate, regular rhythm and normal heart sounds.   Pulmonary/Chest: Effort normal and breath sounds normal. No respiratory distress. He has no wheezes. He has no rales.  Chest expansion symmetric  Abdominal: Soft. He exhibits distension. There is no tenderness. There is no rebound and no guarding.  Soft, mildly distended abdomen without tenderness.  Musculoskeletal: Normal range of motion.  Neurological: He is alert and oriented to person, place, and time. He is not disoriented. He displays tremor (mild and worse with purposeful movement). No cranial nerve deficit or sensory deficit. He exhibits normal muscle tone. Coordination abnormal. GCS eye subscore is 4. GCS verbal subscore is 5. GCS motor subscore is 6.  GCS 15. Speech is dysarthric, but goal oriented. No cranial nerve deficits appreciated;  symmetric eyebrow raise, no facial drooping, tongue midline. Normal grip strength appreciated with 4/5 strength against resistance in all major muscle groups b/l. Difficulty with FNF on left which is mild; right FNF intact. No pronator drift. Sensation to light touch intact. Patellar and achilles reflexes 2+ b/l.  Skin: Skin is warm and dry. No rash noted. He is not diaphoretic. No erythema. No pallor.  Psychiatric: He has a normal mood and affect. His behavior is normal.  Nursing note and vitals reviewed.   ED Course  Procedures (including critical care time) Labs Review Labs Reviewed  CBC WITH DIFFERENTIAL - Abnormal; Notable for the following:    RBC 3.25 (*)    Hemoglobin 8.9 (*)    HCT 29.1 (*)    RDW 17.0 (*)    Monocytes Relative 15 (*)    Monocytes Absolute 1.4 (*)    All other components within normal limits  COMPREHENSIVE METABOLIC PANEL - Abnormal; Notable for the following:    BUN 55 (*)    Creatinine, Ser 2.98 (*)    Albumin 3.1 (*)    Total Bilirubin <0.2 (*)    GFR calc non Af Amer 20 (*)  GFR calc Af Amer 23 (*)    Anion gap 18 (*)    All other components within normal limits  PROTIME-INR - Abnormal; Notable for the following:    Prothrombin Time 15.4 (*)    All other components within normal limits  I-STAT CG4 LACTIC ACID, ED - Abnormal; Notable for the following:    Lactic Acid, Venous 0.32 (*)    All other components within normal limits  URINE CULTURE  URINALYSIS, ROUTINE W REFLEX MICROSCOPIC  POC OCCULT BLOOD, ED    Imaging Review Dg Chest Portable 1 View  05/09/2014   CLINICAL DATA:  Weakness and confusion. New onset of slurred speech today.  EXAM: PORTABLE CHEST - 1 VIEW  COMPARISON:  02/12/2013  FINDINGS: The heart size and mediastinal contours are within normal limits. Both lungs are clear. The visualized skeletal structures are unremarkable.  IMPRESSION: Normal exam.   Electronically Signed   By: Rozetta Nunnery M.D.   On: 05/09/2014 21:08   Mr Brain  Wo Contrast  04/13/2014   CLINICAL DATA:  68 year old male found down with flaccid weakness on the left and abnormal speech. Hypotension with systolic blood pressure of 80 mmHg. Initial encounter.  EXAM: MRI HEAD WITHOUT CONTRAST  TECHNIQUE: Multiplanar, multiecho pulse sequences of the brain and surrounding structures were obtained without intravenous contrast.  COMPARISON:  CTA head 0218 hr the same day. Head CT without contrast 0151 hrs.  FINDINGS: Chronic right PICA cerebellar infarct with encephalomalacia.  No restricted diffusion to suggest acute infarction. No midline shift, mass effect, evidence of mass lesion, ventriculomegaly, extra-axial collection or acute intracranial hemorrhage. Cervicomedullary junction and pituitary are within normal limits. Negative visualized cervical spine. Major intracranial vascular flow voids are preserved.  Widespread patchy cerebral white matter T2 and FLAIR hyperintensity. Summed superimposed perivascular spaces but some areas also most suggestive of chronic white matter lacunar infarcts. Moderate patchy T2 and FLAIR hyperintensity in the pons.  Visible internal auditory structures appear normal. Mastoids are clear. Minor paranasal sinus mucosal thickening. Visualized orbit soft tissues are within normal limits. Visualized scalp soft tissues are within normal limits. Normal bone marrow signal.  IMPRESSION: 1.  No acute intracranial abnormality. 2. Chronic small and medium-sized vessel ischemia.   Electronically Signed   By: Lars Pinks M.D.   On: 04/13/2014 16:28    EKG Interpretation None      MDM   Final diagnoses:  Weakness  Incontinence  Acute confusion  Dysarthria    68 year old male presents to the emergency department for further evaluation of dysarthria, inability to walk and extremity weakness, and confusion. Patient has been seen 2 for similar complaints since 04/13/2014. Patient had a most recent admission on 04/30/2014 with discharge 6 days  ago.twice states that symptoms have been waxing and waning in nature over the past month. She states that patient seems to have an acute worsening in symptoms every 6 days. Patient has a neurologist who is following up with his care as an outpatient. Patient has also had multiple brain CT scans and MRIs which revealed no acute neurologic process.  Patient today dysarthric. He has trouble with finger-nose-finger on the left. He also appears to have a mild tremor with purposeful movements. His neurologic findings today appear stable with prior neuro exams as documented in patient's chart. H/H appears at baseline. UA and CXR negative. Kidney function is elevated; seems as though his kidney function has been elevated in the past and improves over ED admission with IVF hydration. IVF administered  in the ED.   As patient has no new symptoms today, disease course appears stable. Patient already with appropriate neurologic outpatient follow-up. Do not see indication for further emergent workup at this time. Patient seen also by my attending, Dr. Eulis Foster. Dr. Eulis Foster discussed disease course with the patient as well as inpatient versus outpatient management given momentary functional decline. Patient and wife elect for outpatient management at this time. Patient will be referred back to his neurologist. Return precautions provided. Patient discharged in good condition. Dr. Eulis Foster in agreement with this workup, assessment, management plan, and patient's stability for discharge.   Filed Vitals:   05/09/14 1930 05/09/14 1951 05/09/14 2139 05/09/14 2257  BP: 121/78 106/47 118/49 140/97  Pulse: 90 95 90 87  Temp: 97.8 F (36.6 C)   98 F (36.7 C)  TempSrc: Oral   Oral  Resp:  14 13 28   Weight:      SpO2: 94% 97% 99% 93%      Antonietta Breach, PA-C 05/11/14 [place], MD 05/13/14 1145

## 2014-05-10 ENCOUNTER — Telehealth: Payer: Self-pay | Admitting: Neurology

## 2014-05-10 ENCOUNTER — Other Ambulatory Visit: Payer: Self-pay | Admitting: Neurology

## 2014-05-10 DIAGNOSIS — R55 Syncope and collapse: Secondary | ICD-10-CM

## 2014-05-10 NOTE — Telephone Encounter (Signed)
Jason [name] - I placed an urgent cardiology consult for this patient. Would you have time to get it to cardiology and then work with Jason [name] to get him an appointment? He had an episode of dysarthria,weakness, AMS in the setting of a BP of 73/39 (which is very alarming) as well as irregular heart rate. He keeps being admitted for these episodes. He needs to be seen this week in Cardiology if we can pull it off. Thank you both for any help you can provide.

## 2014-05-10 NOTE — Telephone Encounter (Signed)
Called and left message. Unclear why he keeps having episodes. I just saw patient the other day in the office for his first outpatient visit. The flashing lights (along with the repeated episodes of dysarthria and weakness) is new and may point towards complicated migraines. Seizures have always been a consideration. I think he should be admitted to the EMU unit at [facility] to see if we can capture an episode and make sure it is not seizures. Called [phone] and left all of this on a message. Called and spoke to patient's wife on cell phone and discussed it all. Today he woke up and he was fine. Wife would like EMU admission.

## 2014-05-10 NOTE — Telephone Encounter (Signed)
Patient's spouse stated patient was taken to ER by EMS last evening.  Patient wasn't able to walk, talk and seeing flashes of different color lights.  ER physician stated there wasn't anything medically wrong with him, they could have stayed, but there was nothing they could do.  Spouse very concerned and would like a call back.

## 2014-05-13 ENCOUNTER — Encounter: Payer: Medicare Other | Admitting: Radiology

## 2014-05-13 ENCOUNTER — Ambulatory Visit: Payer: Medicare Other | Admitting: Neurology

## 2014-05-13 LAB — URINE CULTURE

## 2014-05-13 NOTE — Telephone Encounter (Signed)
Dr. Jaynee Eagles addressed call on previous phone note. Will close encounter.

## 2014-05-14 ENCOUNTER — Encounter: Payer: Self-pay | Admitting: Neurology

## 2014-05-14 ENCOUNTER — Telehealth (HOSPITAL_COMMUNITY): Payer: Self-pay

## 2014-05-14 NOTE — ED Notes (Signed)
Post ED Visit - Positive Culture Follow-up  Culture report reviewed by antimicrobial stewardship pharmacist: []  Wes Dulaney, Pharm.D., BCPS [x]  Heide Guile, Pharm.D., BCPS []  Alycia Rossetti, Pharm.D., BCPS []  [place], Pharm.D., BCPS, AAHIVP []  Legrand Como, Pharm.D., BCPS, AAHIVP []  Elicia Lamp, Pharm.D.   Positive  Urine culture No antibiotic needed per Katherine Roan. no further patient follow-up is required at this time.  Ileene Musa 05/14/2014, 11:32 AM

## 2014-05-14 NOTE — Telephone Encounter (Signed)
Spoke to [employer]. Scheduled patient @ [facility] office for 05/15/14 @ 01:40pm. Spouse is aware.

## 2014-05-15 ENCOUNTER — Encounter: Payer: Self-pay | Admitting: Cardiology

## 2014-05-15 ENCOUNTER — Ambulatory Visit (INDEPENDENT_AMBULATORY_CARE_PROVIDER_SITE_OTHER): Payer: Medicare Other | Admitting: Cardiology

## 2014-05-15 ENCOUNTER — Other Ambulatory Visit: Payer: Self-pay | Admitting: Neurology

## 2014-05-15 VITALS — BP 148/90 | HR 82 | Ht 65.0 in | Wt 182.0 lb

## 2014-05-15 DIAGNOSIS — R531 Weakness: Secondary | ICD-10-CM

## 2014-05-15 NOTE — Progress Notes (Signed)
Clinical Summary Jason [name] is a 68 y.o.male seen today as a new patient for weakness.   1. Weakness - episodes started in early October. 1st episode found sitting confused in chair at home, noted +bladder incontninence. Diffuse feeling of weakness all over, + dysarthia. He denies LOC. Patient taken to [facility] for evaluation, extensive neuro workup with no clear etiology. Has had multiple episodes since that time. Wife reports episodes can be associated with low blood pressures, reportedly with sytolics as low as 50. She reports her home monitor will also show varying pulse rates ranging from 50-110s that are irregular. Episodes approx every 6 days. Some of the symptoms of the episodes are transient however he reports lingering continued lower extremity weakness that has progressed to where its difficult for him to walk as well as lingering dysarthria.  - interestingly enough he tend to have very labile renal function as well during these presentations, often elevated on presentation and improved with IVF suggesting hypovolemia.     Past Medical History  Diagnosis Date  . Hypertension   . Stroke   . Prostate cancer   . Depression   . Bipolar 1 disorder   . Acid reflux disease with ulcer      Allergies  Allergen Reactions  . Penicillins     Unknown reaction     Current Outpatient Prescriptions  Medication Sig Dispense Refill  . aspirin EC 325 MG tablet Take 325 mg by mouth daily.    Marland Kitchen atorvastatin (LIPITOR) 40 MG tablet Take 1 tablet (40 mg total) by mouth daily at 6 PM. 30 tablet 0  . divalproex (DEPAKOTE ER) 250 MG 24 hr tablet Take 1,750 mg by mouth every evening.    Marland Kitchen FLUoxetine (PROZAC) 20 MG capsule Take 60 mg by mouth daily.    Marland Kitchen glucosamine-chondroitin 500-400 MG tablet Take 1 tablet by mouth 2 (two) times daily.     Marland Kitchen HYDROcodone-acetaminophen (NORCO) 7.5-325 MG per tablet Take 1 tablet by mouth every 6 (six) hours as needed for moderate pain.    Marland Kitchen lisinopril  (PRINIVIL,ZESTRIL) 10 MG tablet Take 1 tablet (10 mg total) by mouth daily. 30 tablet 1  . Magnesium Hydroxide (MAGNESIA PO) Take 1 tablet by mouth daily as needed (for headache).    . mirtazapine (REMERON) 45 MG tablet Take 45 mg by mouth at bedtime.    Marland Kitchen omeprazole (PRILOSEC) 20 MG capsule Take 40 mg by mouth daily.    . salsalate (DISALCID) 750 MG tablet Take 1,500 mg by mouth 2 (two) times daily.     No current facility-administered medications for this visit.     Past Surgical History  Procedure Laterality Date  . Prostate surgery       Allergies  Allergen Reactions  . Penicillins     Unknown reaction      Family History  Problem Relation Age of Onset  . Stroke Mother     Hemorrhagic stroke  . Cancer Father     Pancreatic cancer  . Hypertension Father      Social History Jason [name] reports that he quit smoking about 11 years ago. He does not have any smokeless tobacco history on file. Jason [name] reports that he does not drink alcohol.   Review of Systems CONSTITUTIONAL: No weight loss, fever, chills, weakness or fatigue.  HEENT: Eyes: No visual loss, blurred vision, double vision or yellow sclerae.No hearing loss, sneezing, congestion, runny nose or sore throat.  SKIN: No rash or  itching.  CARDIOVASCULAR: per HPI RESPIRATORY: No shortness of breath, cough or sputum.  GASTROINTESTINAL: No anorexia, nausea, vomiting or diarrhea. No abdominal pain or blood.  GENITOURINARY: No burning on urination, no polyuria NEUROLOGICAL:per HPI.  MUSCULOSKELETAL: No muscle, back pain, joint pain or stiffness.  LYMPHATICS: No enlarged nodes. No history of splenectomy.  PSYCHIATRIC: No history of depression or anxiety.  ENDOCRINOLOGIC: No reports of sweating, cold or heat intolerance. No polyuria or polydipsia.  Marland Kitchen   Physical Examination p 82 bp 148/90 Wt 182 lbs BMI 30 Gen: resting comfortably, no acute distress HEENT: no scleral icterus, pupils equal round and  reactive, no palptable cervical adenopathy,  CV: RRR, no m/r/g, no JVD, no carotid bruits Resp: Clear to auscultation bilaterally GI: abdomen is soft, non-tender, non-distended, normal bowel sounds, no hepatosplenomegaly MSK: extremities are warm, no edema.  Skin: warm, no rash Psych: appropriate affect   Diagnostic Studies 04/2014 Echo Study Conclusions  - Left ventricle: The cavity size was normal. Wall thickness was increased in a pattern of mild LVH. Systolic function was vigorous. The estimated ejection fraction was in the range of 65% to 70%. Wall motion was normal; there were no regional wall motion abnormalities. The study is not technically sufficient to allow evaluation of LV diastolic function. - Aortic valve: Trileaflet; mildly calcified leaflets. There was no significant regurgitation. - Mitral valve: There was trivial regurgitation. - Left atrium: The atrium was mildly dilated. - Right atrium: Central venous pressure (est): 3 mm Hg. - Atrial septum: No defect or patent foramen ovale was identified. - Tricuspid valve: There was physiologic regurgitation. - Pulmonary arteries: Systolic pressure could not be accurately estimated. - Pericardium, extracardiac: There was no pericardial effusion.  Impressions:  - Mild LVH with LVEF 65-70%, indeterminate diastolic function. Mild left atrial enlargement. Unable to assess PASP. No obvious PFO or ASD.   04/14/14 Carotid US Summary: Right: 1-39% ICA stenosis. ICA/CCA ratio is 1.12. Left: 60-79% ICA stenosis. ICA/CCA ratio is 2.63. Bilateral: Vertebral artery flow is antegrade with low velocities noted in the right vertebral artery. . Mild ECA stenosis.  Other specific details can be found in the table(s) above. Prepared and Electronically Authenticated by  05/02/14 MRI Brain IMPRESSION: MRI HEAD IMPRESSION:  1. No acute intracranial infarct or other abnormality identified. 2. Atrophy with  chronic small vessel ischemic disease, stable from prior. 3. Remote right cerebellar infarct.  MRA HEAD IMPRESSION:  1. Dominant left vertebral artery with poorly visualized right vertebral artery, which may be occluded or partially occluded proximally within the neck. 2. No other proximal Patsie Mccardle occlusion or hemodynamically significant stenosis identified within the intracranial circulation.  04/2014 EKG NSR   Assessment and Plan  1. Weakness - unclear etiology based on constellation of episodic symptoms, a cardiac etiology would not be able to explain these episodes, including that he reports continued leg weakness and dysarthria today despite having a normal blood pressure. His symptoms are not what one would expect if caused by global hypoperfusion related to hypotension only. Unclear how reported low bp's and irregular heat beats are associated. His echo is overall normal. He often presents prerenal with significant elevations in Cr that responds to IVF as well, his oral intake is primarily sodas.  - will stop his ACE-I, encouraged increased oral intake with water and electrolyte rich fluids - will obtain 14 day event monitor to see if any arrhythmia associated with these episodes.   2. AKI - significantly elevated Cr during recent ER visit up to 3,  will repeat. Encouraged increased oral intake.    F/u 1 month for further evaluation.   Arnoldo Lenis, M.D.

## 2014-05-15 NOTE — Patient Instructions (Signed)
Your physician recommends that you schedule a follow-up appointment in: [place] has recommended you make the following change in your medication:   STOP TAKING LISINOPRIL  Your physician recommends that you return for lab work [facility]  Your physician has recommended that you wear an event monitor FOR 14 DAYS. Event monitors are medical devices that record the heart's electrical activity. Doctors most often [place] these monitors to diagnose arrhythmias. Arrhythmias are problems with the speed or rhythm of the heartbeat. The monitor is a small, portable device. You can wear one while you do your normal daily activities. This is usually used to diagnose what is causing palpitations/syncope (passing out).  Thank you for choosing [facility]!!

## 2014-05-16 DIAGNOSIS — M5137 Other intervertebral disc degeneration, lumbosacral region: Secondary | ICD-10-CM | POA: Insufficient documentation

## 2014-05-16 DIAGNOSIS — R002 Palpitations: Secondary | ICD-10-CM

## 2014-05-23 ENCOUNTER — Other Ambulatory Visit: Payer: Self-pay | Admitting: Cardiology

## 2014-05-24 LAB — BASIC METABOLIC PANEL WITH GFR
BUN: 17 mg/dL (ref 6–23)
CALCIUM: 9 mg/dL (ref 8.4–10.5)
CO2: 32 mEq/L (ref 19–32)
Chloride: 106 mEq/L (ref 96–112)
Creat: 1.03 mg/dL (ref 0.50–1.35)
GFR, EST AFRICAN AMERICAN: 86 mL/min
GFR, Est Non African American: 75 mL/min
GLUCOSE: 86 mg/dL (ref 70–99)
Potassium: 4.5 mEq/L (ref 3.5–5.3)
Sodium: 142 mEq/L (ref 135–145)

## 2014-05-24 MED ORDER — DIAZEPAM 5 MG PO TABS: 5.0000 mg | ORAL_TABLET | Freq: Four times a day (QID) | ORAL | Status: DC | PRN

## 2014-05-24 NOTE — Telephone Encounter (Signed)
Jessica: please call in Valium 5mg  3 tabs no refill, for him to his pharmacy listed for MRI tomorrow in Nov 21

## 2014-05-25 ENCOUNTER — Ambulatory Visit
Admission: RE | Admit: 2014-05-25 | Discharge: 2014-05-25 | Disposition: A | Discharge status: Home / Self Care | Payer: Medicare Other | Source: Ambulatory Visit | Attending: Neurology | Admitting: Neurology

## 2014-05-25 DIAGNOSIS — M545 Low back pain, unspecified: Secondary | ICD-10-CM

## 2014-05-25 DIAGNOSIS — R29898 Other symptoms and signs involving the musculoskeletal system: Secondary | ICD-10-CM

## 2014-05-29 ENCOUNTER — Telehealth: Payer: Self-pay | Admitting: Neurology

## 2014-05-29 NOTE — Telephone Encounter (Signed)
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 Asked them to call back for more information and discussion on next steps or we can discuss at follow up.   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

## 2014-06-03 ENCOUNTER — Telehealth: Payer: Self-pay | Admitting: *Deleted

## 2014-06-03 NOTE — Telephone Encounter (Signed)
Received EOS event monitor report. In Dr. Nelly Laurence folder

## 2014-06-11 ENCOUNTER — Telehealth: Payer: Self-pay | Admitting: Neurology

## 2014-06-11 ENCOUNTER — Ambulatory Visit (INDEPENDENT_AMBULATORY_CARE_PROVIDER_SITE_OTHER): Payer: Medicare Other | Admitting: Cardiology

## 2014-06-11 ENCOUNTER — Encounter: Payer: Self-pay | Admitting: Cardiology

## 2014-06-11 VITALS — BP 154/90 | HR 90 | Ht 65.0 in | Wt 189.6 lb

## 2014-06-11 DIAGNOSIS — R531 Weakness: Secondary | ICD-10-CM

## 2014-06-11 DIAGNOSIS — I10 Essential (primary) hypertension: Secondary | ICD-10-CM

## 2014-06-11 NOTE — Patient Instructions (Signed)
Your physician wants you to follow-up in: 1 year You will receive a reminder letter in the mail two months in advance. If you don't receive a letter, please call our office to schedule the follow-up appointment.    Your physician recommends that you continue on your current medications as directed. Please refer to the Current Medication list given to you today.     Thank you for choosing  [facility] !  

## 2014-06-11 NOTE — Telephone Encounter (Signed)
Patient requesting MRI results.  Please call and advise.

## 2014-06-11 NOTE — Progress Notes (Signed)
Clinical Summary Jason [name] is a 68 y.o.male seen today for follow up of the following medical problems.   1. Weakness - episodes started in early October. 1st episode found sitting confused in chair at home, noted +bladder incontninence. Diffuse feeling of weakness all over, + dysarthia. He denies LOC. Patient taken to [facility] for evaluation, extensive neuro workup with no clear etiology. Has had multiple episodes since that time. Wife reports episodes can be associated with low blood pressures, reportedly with sytolics as low as 50. She reports her home monitor will also show varying pulse rates ranging from 50-110s that are irregular. Episodes approx every 6 days. Some of the symptoms of the episodes are transient however he reports lingering continued lower extremity weakness that has progressed to where its difficult for him to walk as well as lingering dysarthria.  - interestingly enough he tend to have very labile renal function as well during these presentations, often elevated on presentation and improved with IVF suggesting hypovolemia.   - since last visit he has had no recurrent episodes. He wore his event monitor for 14 days, without any symptoms and no arrythmias. He is off his ACE-I and drinking sports drinks regularly to keep himself hydrated.   Past Medical History  Diagnosis Date  . Hypertension   . Stroke   . Prostate cancer   . Depression   . Bipolar 1 disorder   . Acid reflux disease with ulcer      Allergies  Allergen Reactions  . Penicillins     Unknown reaction     Current Outpatient Prescriptions  Medication Sig Dispense Refill  . aspirin EC 325 MG tablet Take 325 mg by mouth daily.    Marland Kitchen atorvastatin (LIPITOR) 40 MG tablet Take 1 tablet (40 mg total) by mouth daily at 6 PM. 30 tablet 0  . diazepam (VALIUM) 5 MG tablet Take 1 tablet (5 mg total) by mouth every 6 (six) hours as needed for anxiety. 3 tablet 0  . divalproex (DEPAKOTE ER) 250 MG 24  hr tablet Take 1,750 mg by mouth every evening.    Marland Kitchen FLUoxetine (PROZAC) 20 MG capsule Take 60 mg by mouth daily.    Marland Kitchen glucosamine-chondroitin 500-400 MG tablet Take 1 tablet by mouth 2 (two) times daily.     Marland Kitchen HYDROcodone-acetaminophen (NORCO) 7.5-325 MG per tablet Take 1 tablet by mouth every 6 (six) hours as needed for moderate pain.    . Magnesium Hydroxide (MAGNESIA PO) Take 1 tablet by mouth daily as needed (for headache).    . mirtazapine (REMERON) 45 MG tablet Take 45 mg by mouth at bedtime.    Marland Kitchen omeprazole (PRILOSEC) 20 MG capsule Take 40 mg by mouth daily.    . salsalate (DISALCID) 750 MG tablet Take 1,500 mg by mouth 2 (two) times daily.     No current facility-administered medications for this visit.     Past Surgical History  Procedure Laterality Date  . Prostate surgery       Allergies  Allergen Reactions  . Penicillins     Unknown reaction      Family History  Problem Relation Age of Onset  . Stroke Mother     Hemorrhagic stroke  . Cancer Father     Pancreatic cancer  . Hypertension Father      Social History Jason [name] reports that he quit smoking about 11 years ago. He has never used smokeless tobacco. Jason [name] reports that he does not  drink alcohol.   Review of Systems CONSTITUTIONAL: No weight loss, fever, chills, weakness or fatigue.  HEENT: Eyes: No visual loss, blurred vision, double vision or yellow sclerae.No hearing loss, sneezing, congestion, runny nose or sore throat.  SKIN: No rash or itching.  CARDIOVASCULAR: per HPI RESPIRATORY: No shortness of breath, cough or sputum.  GASTROINTESTINAL: No anorexia, nausea, vomiting or diarrhea. No abdominal pain or blood.  GENITOURINARY: No burning on urination, no polyuria NEUROLOGICAL: leg weakness MUSCULOSKELETAL: No muscle, back pain, joint pain or stiffness.  LYMPHATICS: No enlarged nodes. No history of splenectomy.  PSYCHIATRIC: No history of depression or anxiety.  ENDOCRINOLOGIC: No  reports of sweating, cold or heat intolerance. No polyuria or polydipsia.  Marland Kitchen   Physical Examination p 90 bp 154/90 Wt 189 lbs BMI 32 Gen: resting comfortably, no acute distress HEENT: no scleral icterus, pupils equal round and reactive, no palptable cervical adenopathy,  CV: RRR, no m/r/g, no JVD. Bilateral carotid bruits Resp: Clear to auscultation bilaterally GI: abdomen is soft, non-tender, non-distended, normal bowel sounds, no hepatosplenomegaly MSK: extremities are warm, 1+ bilateral edema Skin: warm, no rash Neuro:  no focal deficits Psych: appropriate affect   Diagnostic Studies 04/2014 Echo Study Conclusions  - Left ventricle: The cavity size was normal. Wall thickness was increased in a pattern of mild LVH. Systolic function was vigorous. The estimated ejection fraction was in the range of 65% to 70%. Wall motion was normal; there were no regional wall motion abnormalities. The study is not technically sufficient to allow evaluation of LV diastolic function. - Aortic valve: Trileaflet; mildly calcified leaflets. There was no significant regurgitation. - Mitral valve: There was trivial regurgitation. - Left atrium: The atrium was mildly dilated. - Right atrium: Central venous pressure (est): 3 mm Hg. - Atrial septum: No defect or patent foramen ovale was identified. - Tricuspid valve: There was physiologic regurgitation. - Pulmonary arteries: Systolic pressure could not be accurately estimated. - Pericardium, extracardiac: There was no pericardial effusion.  Impressions:  - Mild LVH with LVEF 65-70%, indeterminate diastolic function. Mild left atrial enlargement. Unable to assess PASP. No obvious PFO or ASD.   04/14/14 Carotid US Summary: Right: 1-39% ICA stenosis. ICA/CCA ratio is 1.12. Left: 60-79% ICA stenosis. ICA/CCA ratio is 2.63. Bilateral: Vertebral artery flow is antegrade with low velocities noted in the right vertebral artery. .  Mild ECA stenosis.  Other specific details can be found in the table(s) above. Prepared and Electronically Authenticated by  05/02/14 MRI Brain IMPRESSION: MRI HEAD IMPRESSION:  1. No acute intracranial infarct or other abnormality identified. 2. Atrophy with chronic small vessel ischemic disease, stable from prior. 3. Remote right cerebellar infarct.  MRA HEAD IMPRESSION:  1. Dominant left vertebral artery with poorly visualized right vertebral artery, which may be occluded or partially occluded proximally within the neck. 2. No other proximal branch occlusion or hemodynamically significant stenosis identified within the intracranial circulation.  04/2014 EKG NSR    Assessment and Plan  1. Weakness - unclear etiology based on constellation of episodic symptoms, a cardiac etiology would not be able to explain these episodes, including that he reports continued leg weakness and dysarthria today despite having a normal blood pressure. His symptoms are not what one would expect if caused by global hypoperfusion related to hypotension only. Unclear how reported low bp's and irregular heat beats are associated. His echo is overall normal. He often presents prerenal with significant elevations in Cr that responds to IVF. He has increased his oral fluid  intake at home with no recurrent episodes. Heart monitor without significant arrhythmias - no further cardiac testing planned at this time, no evidence to support cardiac cause  2. HTN - off ACE-I due to episodes of hypotension, these episodes have resolved. BP is mildly elevated, will defer on starting therapy at this time to continue monitoring for hypotensive episodes. In near future consider restarting   F/u 1 year   Arnoldo Lenis, M.D.

## 2014-06-13 ENCOUNTER — Other Ambulatory Visit: Payer: Self-pay | Admitting: *Deleted

## 2014-06-13 DIAGNOSIS — R531 Weakness: Secondary | ICD-10-CM

## 2014-06-14 ENCOUNTER — Other Ambulatory Visit: Payer: Self-pay | Admitting: Neurology

## 2014-06-14 DIAGNOSIS — M5416 Radiculopathy, lumbar region: Secondary | ICD-10-CM

## 2014-06-14 NOTE — Telephone Encounter (Signed)
Jason [name] - This patient hasn't heard about his referral to inpatient EEG monitoring. I suspect he just missed the call. Would you follow up on it please? Maybe speak to [name] please?   Also I just placed a neurosurgery referral for his low back. He needs to bring a CD with the images to his appointment. Would you call and give him the procedure on how to get that please? Thank you.

## 2014-06-17 NOTE — Telephone Encounter (Signed)
States that they contacted the patient and he states that he has not had any of the episodes that you referred him for since about a month ago. They do not feel that the patient will benefit from having this monitoring done. Please advise

## 2014-06-17 NOTE — Telephone Encounter (Signed)
Called and spoke to the EMU Dept-Jason [name] states she will call patient back today. Also called and LMVM of patient to inform him, and to let him know if he has any questions to let me know

## 2014-06-18 ENCOUNTER — Other Ambulatory Visit: Payer: Self-pay | Admitting: Neurology

## 2014-06-18 DIAGNOSIS — R569 Unspecified convulsions: Secondary | ICD-10-CM

## 2014-06-18 NOTE — Telephone Encounter (Signed)
Will order in-home eeg monitoring. Patient was having episodes every couple days, apparently he has not had an episode for a month now. So I feel in-home eeg monitoring may be better than admission into an emu unit. I still woul dlike to monitor him for several days and see if there is any epileptiform activity on the eeg.

## 2014-07-03 ENCOUNTER — Telehealth: Payer: Self-pay | Admitting: Neurology

## 2014-07-03 ENCOUNTER — Other Ambulatory Visit: Payer: Self-pay | Admitting: Neurology

## 2014-07-03 DIAGNOSIS — M5416 Radiculopathy, lumbar region: Secondary | ICD-10-CM

## 2014-07-03 NOTE — Telephone Encounter (Signed)
Thank you. I referred him to an orthopaedic practice this time, likely [facility]. Please let him know they will likely call him next week.

## 2014-07-03 NOTE — Telephone Encounter (Signed)
He was originally referral Faxed to [facility] [phone]

## 2014-07-03 NOTE — Telephone Encounter (Signed)
Jason [name] - Please call patient and ask him if he has a specific person he would like to see?

## 2014-07-03 NOTE — Telephone Encounter (Signed)
Pt's wife is calling stating pt wants to get a second opinion regarding back surgery.  He went to see a doctor today and that doctor just wants him to have PT.  Please call and advise.

## 2014-07-03 NOTE — Telephone Encounter (Signed)
Here's one 

## 2014-07-08 NOTE — Telephone Encounter (Signed)
Here's one 

## 2014-07-11 NOTE — Telephone Encounter (Signed)
Patient is scheduled to see Dr. Lynann Bologna at [facility] on 07/22/14. Faxed  over MRI results to [phone]

## 2014-07-15 NOTE — Progress Notes (Signed)
PT addendum - added G-codes    2014-05-17 1452  PT G-Codes **NOT FOR INPATIENT CLASS**  Functional Assessment Tool Used clinical judgment  Functional Limitation Mobility: Walking and moving around  Mobility: Walking and Moving Around Current Status [phone]) CI  Mobility: Walking and Moving Around Goal Status (M6294) CI    Candy Sledge, PT, DPT [phone]

## 2015-08-06 DIAGNOSIS — I639 Cerebral infarction, unspecified: Secondary | ICD-10-CM

## 2015-08-06 HISTORY — DX: Cerebral infarction, unspecified: I63.9

## 2015-08-30 ENCOUNTER — Inpatient Hospital Stay (HOSPITAL_COMMUNITY): Payer: Medicare Other

## 2015-08-30 ENCOUNTER — Encounter (HOSPITAL_COMMUNITY): Payer: Self-pay | Admitting: Emergency Medicine

## 2015-08-30 ENCOUNTER — Emergency Department (HOSPITAL_COMMUNITY): Payer: Medicare Other

## 2015-08-30 ENCOUNTER — Inpatient Hospital Stay (HOSPITAL_COMMUNITY)
Admission: EM | Admit: 2015-08-30 | Discharge: 2015-09-01 | DRG: 065 | Disposition: A | Discharge status: Home / Self Care | Payer: Medicare Other | Attending: Internal Medicine | Admitting: Internal Medicine

## 2015-08-30 DIAGNOSIS — Z7982 Long term (current) use of aspirin: Secondary | ICD-10-CM

## 2015-08-30 DIAGNOSIS — I63539 Cerebral infarction due to unspecified occlusion or stenosis of unspecified posterior cerebral artery: Secondary | ICD-10-CM | POA: Diagnosis not present

## 2015-08-30 DIAGNOSIS — F319 Bipolar disorder, unspecified: Secondary | ICD-10-CM | POA: Diagnosis present

## 2015-08-30 DIAGNOSIS — I739 Peripheral vascular disease, unspecified: Secondary | ICD-10-CM | POA: Diagnosis present

## 2015-08-30 DIAGNOSIS — Z880 Allergy status to penicillin: Secondary | ICD-10-CM | POA: Diagnosis not present

## 2015-08-30 DIAGNOSIS — K219 Gastro-esophageal reflux disease without esophagitis: Secondary | ICD-10-CM | POA: Diagnosis present

## 2015-08-30 DIAGNOSIS — I639 Cerebral infarction, unspecified: Principal | ICD-10-CM | POA: Diagnosis present

## 2015-08-30 DIAGNOSIS — I6789 Other cerebrovascular disease: Secondary | ICD-10-CM | POA: Diagnosis not present

## 2015-08-30 DIAGNOSIS — E782 Mixed hyperlipidemia: Secondary | ICD-10-CM | POA: Diagnosis present

## 2015-08-30 DIAGNOSIS — G919 Hydrocephalus, unspecified: Secondary | ICD-10-CM | POA: Diagnosis present

## 2015-08-30 DIAGNOSIS — Z87891 Personal history of nicotine dependence: Secondary | ICD-10-CM

## 2015-08-30 DIAGNOSIS — Z8546 Personal history of malignant neoplasm of prostate: Secondary | ICD-10-CM | POA: Diagnosis not present

## 2015-08-30 DIAGNOSIS — Z8249 Family history of ischemic heart disease and other diseases of the circulatory system: Secondary | ICD-10-CM

## 2015-08-30 DIAGNOSIS — I674 Hypertensive encephalopathy: Secondary | ICD-10-CM | POA: Diagnosis present

## 2015-08-30 DIAGNOSIS — H5340 Unspecified visual field defects: Secondary | ICD-10-CM | POA: Diagnosis present

## 2015-08-30 DIAGNOSIS — Z79899 Other long term (current) drug therapy: Secondary | ICD-10-CM | POA: Diagnosis not present

## 2015-08-30 DIAGNOSIS — I69354 Hemiplegia and hemiparesis following cerebral infarction affecting left non-dominant side: Secondary | ICD-10-CM

## 2015-08-30 DIAGNOSIS — E785 Hyperlipidemia, unspecified: Secondary | ICD-10-CM | POA: Diagnosis present

## 2015-08-30 DIAGNOSIS — N179 Acute kidney failure, unspecified: Secondary | ICD-10-CM | POA: Diagnosis present

## 2015-08-30 DIAGNOSIS — E669 Obesity, unspecified: Secondary | ICD-10-CM | POA: Diagnosis present

## 2015-08-30 DIAGNOSIS — F329 Major depressive disorder, single episode, unspecified: Secondary | ICD-10-CM | POA: Diagnosis present

## 2015-08-30 DIAGNOSIS — F419 Anxiety disorder, unspecified: Secondary | ICD-10-CM | POA: Diagnosis present

## 2015-08-30 DIAGNOSIS — I10 Essential (primary) hypertension: Secondary | ICD-10-CM | POA: Diagnosis present

## 2015-08-30 DIAGNOSIS — F32A Depression, unspecified: Secondary | ICD-10-CM | POA: Diagnosis present

## 2015-08-30 DIAGNOSIS — Z6830 Body mass index (BMI) 30.0-30.9, adult: Secondary | ICD-10-CM

## 2015-08-30 DIAGNOSIS — I63439 Cerebral infarction due to embolism of unspecified posterior cerebral artery: Secondary | ICD-10-CM | POA: Diagnosis not present

## 2015-08-30 DIAGNOSIS — I6529 Occlusion and stenosis of unspecified carotid artery: Secondary | ICD-10-CM | POA: Insufficient documentation

## 2015-08-30 LAB — COMPREHENSIVE METABOLIC PANEL
ALBUMIN: 3.7 g/dL (ref 3.5–5.0)
ALT: 27 U/L (ref 17–63)
AST: 29 U/L (ref 15–41)
Alkaline Phosphatase: 138 U/L — ABNORMAL HIGH (ref 38–126)
Anion gap: 14 (ref 5–15)
BILIRUBIN TOTAL: 0.4 mg/dL (ref 0.3–1.2)
BUN: 17 mg/dL (ref 6–20)
CALCIUM: 9.4 mg/dL (ref 8.9–10.3)
CHLORIDE: 104 mmol/L (ref 101–111)
CO2: 25 mmol/L (ref 22–32)
Creatinine, Ser: 1.63 mg/dL — ABNORMAL HIGH (ref 0.61–1.24)
GFR calc Af Amer: 48 mL/min — ABNORMAL LOW (ref >60)
GFR calc non Af Amer: 42 mL/min — ABNORMAL LOW (ref >60)
Glucose, Bld: 142 mg/dL — ABNORMAL HIGH (ref 65–99)
POTASSIUM: 3.5 mmol/L (ref 3.5–5.1)
Sodium: 143 mmol/L (ref 135–145)
TOTAL PROTEIN: 6.6 g/dL (ref 6.5–8.1)

## 2015-08-30 LAB — CBC WITH DIFFERENTIAL/PLATELET
BASOS ABS: 0.1 10*3/uL (ref 0.0–0.1)
Basophils Relative: 1 %
Eosinophils Absolute: 0.1 10*3/uL (ref 0.0–0.7)
Eosinophils Relative: 1 %
HEMATOCRIT: 39.4 % (ref 39.0–52.0)
Hemoglobin: 12.4 g/dL — ABNORMAL LOW (ref 13.0–17.0)
LYMPHS PCT: 16 %
Lymphs Abs: 1.2 10*3/uL (ref 0.7–4.0)
MCH: 28.2 pg (ref 26.0–34.0)
MCHC: 31.5 g/dL (ref 30.0–36.0)
MCV: 89.7 fL (ref 78.0–100.0)
Monocytes Absolute: 0.3 10*3/uL (ref 0.1–1.0)
Monocytes Relative: 5 %
NEUTROS ABS: 5.8 10*3/uL (ref 1.7–7.7)
Neutrophils Relative %: 77 %
PLATELETS: 279 10*3/uL (ref 150–400)
RBC: 4.39 MIL/uL (ref 4.22–5.81)
RDW: 15.1 % (ref 11.5–15.5)
WBC: 7.4 10*3/uL (ref 4.0–10.5)

## 2015-08-30 LAB — CBG MONITORING, ED: GLUCOSE-CAPILLARY: 102 mg/dL — AB (ref 65–99)

## 2015-08-30 LAB — MAGNESIUM: Magnesium: 2.5 mg/dL — ABNORMAL HIGH (ref 1.7–2.4)

## 2015-08-30 MED ORDER — GABAPENTIN 300 MG PO CAPS
300.0000 mg | ORAL_CAPSULE | Freq: Two times a day (BID) | ORAL | Status: DC
  Administered 2015-08-30 – 2015-09-01 (×4): 300 mg via ORAL
  Filled 2015-08-30 (×4): qty 1

## 2015-08-30 MED ORDER — MIRTAZAPINE 30 MG PO TABS
45.0000 mg | ORAL_TABLET | Freq: Every day | ORAL | Status: DC
  Administered 2015-08-30 – 2015-08-31 (×2): 45 mg via ORAL
  Filled 2015-08-30 (×2): qty 1

## 2015-08-30 MED ORDER — OXYCODONE-ACETAMINOPHEN 5-325 MG PO TABS
1.0000 | ORAL_TABLET | ORAL | Status: DC | PRN
  Administered 2015-08-30 – 2015-09-01 (×5): 1 via ORAL
  Filled 2015-08-30 (×5): qty 1

## 2015-08-30 MED ORDER — STROKE: EARLY STAGES OF RECOVERY BOOK
Freq: Once | Status: AC
  Administered 2015-08-30: 18:00:00
  Filled 2015-08-30: qty 1

## 2015-08-30 MED ORDER — FLUOXETINE HCL 20 MG PO CAPS
60.0000 mg | ORAL_CAPSULE | Freq: Every day | ORAL | Status: DC
  Administered 2015-08-30 – 2015-09-01 (×3): 60 mg via ORAL
  Filled 2015-08-30 (×3): qty 3

## 2015-08-30 MED ORDER — SENNOSIDES-DOCUSATE SODIUM 8.6-50 MG PO TABS: 1.0000 | ORAL_TABLET | Freq: Every evening | ORAL | Status: DC | PRN

## 2015-08-30 MED ORDER — METHOCARBAMOL 750 MG PO TABS
750.0000 mg | ORAL_TABLET | Freq: Every day | ORAL | Status: DC
  Administered 2015-08-30 – 2015-08-31 (×2): 750 mg via ORAL
  Filled 2015-08-30 (×2): qty 1

## 2015-08-30 MED ORDER — ENOXAPARIN SODIUM 40 MG/0.4ML ~~LOC~~ SOLN: 40.0000 mg | SUBCUTANEOUS | Status: DC

## 2015-08-30 MED ORDER — SODIUM CHLORIDE 0.9 % IV SOLN
INTRAVENOUS | Status: DC
  Administered 2015-08-30 – 2015-08-31 (×2): via INTRAVENOUS

## 2015-08-30 MED ORDER — ATORVASTATIN CALCIUM 40 MG PO TABS
40.0000 mg | ORAL_TABLET | Freq: Every day | ORAL | Status: DC
  Administered 2015-08-30: 40 mg via ORAL
  Filled 2015-08-30: qty 1

## 2015-08-30 MED ORDER — ENOXAPARIN SODIUM 40 MG/0.4ML ~~LOC~~ SOLN
40.0000 mg | SUBCUTANEOUS | Status: DC
  Administered 2015-08-30 – 2015-08-31 (×2): 40 mg via SUBCUTANEOUS
  Filled 2015-08-30 (×3): qty 0.4

## 2015-08-30 MED ORDER — ASPIRIN EC 325 MG PO TBEC
325.0000 mg | DELAYED_RELEASE_TABLET | Freq: Every day | ORAL | Status: DC
  Administered 2015-08-30 – 2015-09-01 (×3): 325 mg via ORAL
  Filled 2015-08-30 (×3): qty 1

## 2015-08-30 MED ORDER — PANTOPRAZOLE SODIUM 40 MG PO TBEC
40.0000 mg | DELAYED_RELEASE_TABLET | Freq: Every day | ORAL | Status: DC
  Administered 2015-08-30 – 2015-09-01 (×3): 40 mg via ORAL
  Filled 2015-08-30 (×3): qty 1

## 2015-08-30 NOTE — ED Provider Notes (Signed)
CSN: [id number]     Arrival date & time 08/30/15  0721 History   First MD Initiated Contact with Patient 08/30/15 [phone]     Chief Complaint  Patient presents with  . Altered Mental Status     (Consider location/radiation/quality/duration/timing/severity/associated sxs/prior Treatment) HPI Comments: Woke up at 330AM, felt fine initially Then around 4 by estimation, began to have sensation of not feeling right, confusion, rubbery arms and legs bilaterally, difficulty reading words on the computer with left side of vision in both eyes appearing to be decreased, worse in the left eye however present bilaterally.  Also reports difficulty finding words.  Reports symptoms significantly improved however some continuing sensation of difficulty speaking and change in vision  Patient is a 70 y.o. male presenting with altered mental status.  Altered Mental Status Presenting symptoms: confusion   Severity:  Moderate Most recent episode:  Today Episode history:  Single Duration:  4 hours Timing:  Constant Associated symptoms: light-headedness   Associated symptoms: no abdominal pain, no fever, no headaches, no nausea, no rash, no vomiting and no weakness (legs feel rubbery)     Past Medical History  Diagnosis Date  . Hypertension   . Stroke ([place])   . Prostate cancer ([place])   . Depression   . Bipolar 1 disorder ([place])   . Acid reflux disease with ulcer    Past Surgical History  Procedure Laterality Date  . Prostate surgery    . Back surgery     Family History  Problem Relation Age of Onset  . Stroke Mother     Hemorrhagic stroke  . Cancer Father     Pancreatic cancer  . Hypertension Father    Social History  Substance Use Topics  . Smoking status: Former Smoker    Quit date: 03/06/2003  . Smokeless tobacco: Never Used  . Alcohol Use: No    Review of Systems  Constitutional: Negative for fever.  HENT: Negative for sore throat.   Eyes: Positive for visual disturbance.   Respiratory: Negative for shortness of breath.   Cardiovascular: Negative for chest pain.  Gastrointestinal: Negative for nausea, vomiting and abdominal pain.  Genitourinary: Negative for difficulty urinating.  Musculoskeletal: Negative for back pain and neck stiffness.  Skin: Negative for rash.  Neurological: Positive for speech difficulty and light-headedness. Negative for syncope (cant remember), facial asymmetry, weakness (legs feel rubbery), numbness and headaches.  Psychiatric/Behavioral: Positive for confusion.      Allergies  Penicillins  Home Medications   Prior to Admission medications   Medication Sig Start Date End Date Taking? Authorizing Provider  aspirin EC 325 MG tablet Take 325 mg by mouth daily.   Yes Historical Provider, MD  atorvastatin (LIPITOR) 40 MG tablet Take 1 tablet (40 mg total) by mouth daily at 6 PM. 04/14/14  Yes Maryann Mikhail, DO  carboxymethylcellulose 1 % ophthalmic solution Apply 1 drop to eye 3 (three) times daily.   Yes Historical Provider, MD  FLUoxetine (PROZAC) 20 MG capsule Take 60 mg by mouth daily.   Yes Historical Provider, MD  gabapentin (NEURONTIN) 300 MG capsule Take 300 mg by mouth 2 (two) times daily.   Yes Historical Provider, MD  lisinopril (PRINIVIL,ZESTRIL) 20 MG tablet Take 40 mg by mouth daily.   Yes Historical Provider, MD  methocarbamol (ROBAXIN) 750 MG tablet Take 750 mg by mouth at bedtime.   Yes Historical Provider, MD  mirtazapine (REMERON) 45 MG tablet Take 45 mg by mouth at bedtime.   Yes Historical Provider,  MD  Omega-3 Fatty Acids (FISH OIL) 1000 MG CAPS Take 2,000 mg by mouth daily.   Yes Historical Provider, MD  omeprazole (PRILOSEC) 20 MG capsule Take 20 mg by mouth 2 (two) times daily before a meal.    Yes Historical Provider, MD  oxyCODONE-acetaminophen (PERCOCET/ROXICET) 5-325 MG tablet Take 1 tablet by mouth every 4 (four) hours as needed for moderate pain or severe pain.   Yes Historical Provider, MD  prazosin  (MINIPRESS) 1 MG capsule Take 3 mg by mouth at bedtime.   Yes Historical Provider, MD   BP 162/81 mmHg  Pulse 80  Temp(Src) 97.9 F (36.6 C) (Oral)  Resp 16  SpO2 95% Physical Exam  Constitutional: He is oriented to person, place, and time. He appears well-developed and well-nourished. No distress.  HENT:  Head: Normocephalic and atraumatic.  Eyes: Conjunctivae and EOM are normal.  Neck: Normal range of motion.  Cardiovascular: Normal rate, regular rhythm, normal heart sounds and intact distal pulses.  Exam reveals no gallop and no friction rub.   No murmur heard. Pulmonary/Chest: Effort normal and breath sounds normal. No respiratory distress. He has no wheezes. He has no rales.  Abdominal: Soft. He exhibits no distension. There is no tenderness. There is no guarding.  Musculoskeletal: He exhibits no edema.  Neurological: He is alert and oriented to person, place, and time.  Left sided weakness baseline since CVA  Skin: Skin is warm and dry. He is not diaphoretic.  Nursing note and vitals reviewed.   ED Course  Procedures (including critical care time) Labs Review Labs Reviewed  CBC WITH DIFFERENTIAL/PLATELET - Abnormal; Notable for the following:    Hemoglobin 12.4 (*)    All other components within normal limits  COMPREHENSIVE METABOLIC PANEL - Abnormal; Notable for the following:    Glucose, Bld 142 (*)    Creatinine, Ser 1.63 (*)    Alkaline Phosphatase 138 (*)    GFR calc non Af Amer 42 (*)    GFR calc Af Amer 48 (*)    All other components within normal limits  MAGNESIUM - Abnormal; Notable for the following:    Magnesium 2.5 (*)    All other components within normal limits  CBG MONITORING, ED - Abnormal; Notable for the following:    Glucose-Capillary 102 (*)    All other components within normal limits  HEMOGLOBIN A1C  LIPID PANEL  BASIC METABOLIC PANEL    Imaging Review Dg Chest 2 View  08/30/2015  CLINICAL DATA:  CVA EXAM: CHEST  2 VIEW COMPARISON:   05/09/2014 FINDINGS: The heart size and mediastinal contours are within normal limits. Both lungs are clear. The visualized skeletal structures are unremarkable. IMPRESSION: No active cardiopulmonary disease. Electronically Signed   By: Rolm Baptise M.D.   On: 08/30/2015 17:17   Mr Brain Wo Contrast  08/30/2015  CLINICAL DATA:  Patient awoke with confusion.  Blurred vision. EXAM: MRI HEAD WITHOUT CONTRAST TECHNIQUE: Multiplanar, multiecho pulse sequences of the brain and surrounding structures were obtained without intravenous contrast. COMPARISON:  MR brain 05/02/2014. FINDINGS: Small foci of restricted diffusion affect the RIGHT occipital cortex and subcortical white matter, consistent with acute infarction. No visible acute hemorrhage, mass lesion or extra-axial fluid. Global atrophy with hydrocephalus ex vacuo. Extensive white matter disease. No midline abnormality. Large remote RIGHT cerebellar infarct. Scattered deep white matter lacunar infarcts. No chronic hemorrhage. Flow voids are maintained. LEFT vertebral dominant. BILATERAL cataract extraction. No sinus or mastoid air fluid level. IMPRESSION: Small foci of  acute infarction affecting the RIGHT occipital lobe. No associated hemorrhage. Chronic changes as described. Electronically Signed   By: Staci Righter M.D.   On: 08/30/2015 13:25   Mr Jodene Nam Head/brain Wo Cm  08/30/2015  CLINICAL DATA:  Patient awoke with confusion. Blurred vision. Acute RIGHT occipital infarction on MR earlier today. EXAM: MRA HEAD WITHOUT CONTRAST TECHNIQUE: Angiographic images of the Circle of Willis were obtained using MRA technique without intravenous contrast. COMPARISON:  MRI brain 08/30/2015.  MRI brain and MRA 05/02/2014. FINDINGS: Internal carotid arteries show no stenosis or occlusion. Sessile 3 mm outpouching from the RIGHT ICA, inferior cavernous segment, representing atherosclerotic disease. Basilar artery widely patent with LEFT vertebral dominant. Severely diseased,  but patent RIGHT vertebral does not contribute meaningfully to the basilar. There is no proximal stenosis of the anterior, middle, or posterior cerebral arteries. No cerebellar branch occlusion. Similar appearance to priors. IMPRESSION: No proximal intracranial stenosis or dissection is observed. RIGHT PCA widely patent. Electronically Signed   By: Staci Righter M.D.   On: 08/30/2015 16:43   I have personally reviewed and evaluated these images and lab results as part of my medical decision-making.   EKG Interpretation None      MDM   Final diagnoses:  CVA (cerebral infarction)  CVA (cerebral infarction)   70yo male hx CVA left sided weakness presents with sensation of confusion, feeling legs rubbery, difficulty speaking and change in vision with left sided visual deficit per pt hx. Patient with history of episodes of generalized weakness, "not feeling right" in past, has had work up for acute encephalopathy as inpt 2 years ago, negative MRI and EEG previously.  On arrival, pt reports symptoms improving, and does not have appreciable visual field deficit or aphasia on exam and code stroke not initiated as pt not appropriate tpa candidate.  Neurology consulted and MRI ordered showing acute occipital CVA. Hospitalist consulted for admission.   Gareth Morgan, MD 08/30/15 2119

## 2015-08-30 NOTE — H&P (Signed)
[facility] History and Physical  ZACKRY DEINES QIH:[id number] DOB: Mar 09, 1946 DOA: 08/30/2015  Referring physician: Emergency Department PCP: Verline Lema, MD   CHIEF COMPLAINT:                   HPI: BARAKA KLATT is a 70 y.o. male with a history of a CVA in 2010. Patient woke up around 3:30am and sat down to do some work at his home computer. He took 2 vicodin for back pain. At some point, patient doesn't know what time, he began to have problems focusing on the computer screen. All of his extremities felt rubbery and out of control. Patient stood up, was off balance but got wife who called EMS. Neurologic deficits had resolved by time examined by EDP but MRI + for CVA    ED COURSE:           Labs:   Cr 1.3, Mg 2.5, alk phos 138  EKG:    Sinus rhythm Right axis deviation          QT 434        Medications - No data to display  Review of Systems  Constitutional: Negative.   HENT: Negative.   Eyes: Positive for blurred vision.  Respiratory: Negative.   Cardiovascular: Negative.   Gastrointestinal: Negative.   Genitourinary: Negative.   Musculoskeletal: Negative.   Skin: Negative.   Neurological: Negative.   Endo/Heme/Allergies: Negative.   Psychiatric/Behavioral: Negative.     Past Medical History  Diagnosis Date  . Hypertension   . Stroke ([place])   . Prostate cancer ([place])   . Depression   . Bipolar 1 disorder ([place])   . Acid reflux disease with ulcer    Past Surgical History  Procedure Laterality Date  . Prostate surgery    . Back surgery      SOCIAL HISTORY:  reports that he quit smoking about 12 years ago. He has never used smokeless tobacco. He reports that he does not drink alcohol or use illicit drugs. Lives:   At home with wife   Assistive devices:   Uses cane for ambulation.   Allergies  Allergen Reactions  . Penicillins     Unknown reaction Has patient had a PCN reaction causing immediate rash, facial/tongue/throat swelling, SOB or  lightheadedness with hypotension: Unknown Has patient had a PCN reaction causing severe rash involving mucus membranes or skin necrosis: Unknown Has patient had a PCN reaction that required hospitalization: No Has patient had a PCN reaction occurring within the last 10 years: No If all of the above answers are "NO", then may proceed with Cephalosporin use.     Family History  Problem Relation Age of Onset  . Stroke Mother     Hemorrhagic stroke  . Cancer Father     Pancreatic cancer  . Hypertension Father     Prior to Admission medications   Medication Sig Start Date End Date Taking? Authorizing Provider  aspirin EC 325 MG tablet Take 325 mg by mouth daily.   Yes Historical Provider, MD  atorvastatin (LIPITOR) 40 MG tablet Take 1 tablet (40 mg total) by mouth daily at 6 PM. 04/14/14  Yes Maryann Mikhail, DO  carboxymethylcellulose 1 % ophthalmic solution Apply 1 drop to eye 3 (three) times daily.   Yes Historical Provider, MD  FLUoxetine (PROZAC) 20 MG capsule Take 60 mg by mouth daily.   Yes Historical Provider, MD  gabapentin (NEURONTIN) 300 MG capsule Take 300 mg by mouth  2 (two) times daily.   Yes Historical Provider, MD  lisinopril (PRINIVIL,ZESTRIL) 20 MG tablet Take 40 mg by mouth daily.   Yes Historical Provider, MD  methocarbamol (ROBAXIN) 750 MG tablet Take 750 mg by mouth at bedtime.   Yes Historical Provider, MD  mirtazapine (REMERON) 45 MG tablet Take 45 mg by mouth at bedtime.   Yes Historical Provider, MD  Omega-3 Fatty Acids (FISH OIL) 1000 MG CAPS Take 2,000 mg by mouth daily.   Yes Historical Provider, MD  omeprazole (PRILOSEC) 20 MG capsule Take 20 mg by mouth 2 (two) times daily before a meal.    Yes Historical Provider, MD  oxyCODONE-acetaminophen (PERCOCET/ROXICET) 5-325 MG tablet Take 1 tablet by mouth every 4 (four) hours as needed for moderate pain or severe pain.   Yes Historical Provider, MD  prazosin (MINIPRESS) 1 MG capsule Take 3 mg by mouth at bedtime.    Yes Historical Provider, MD   PHYSICAL EXAM: Filed Vitals:   08/30/15 0733 08/30/15 1115 08/30/15 1200 08/30/15 1215  BP: 133/75 163/93 153/95 163/85  Pulse: 87 77 71 71  Temp: 97.6 F (36.4 C)     TempSrc: Oral     Resp: _0 SpO2: 98% 96% 97% 94%    Wt Readings from Last 3 Encounters:  06/11/14 86.002 kg (189 lb 9.6 oz)  05/15/14 82.555 kg (182 lb)  05/09/14 83.915 kg (185 lb)   General:  Pleasant white male. Appears calm and comfortable Eyes: PER, normal lids, irises & conjunctiva ENT: grossly normal hearing, lips & tongue Neck: no LAD, no masses Cardiovascular: RRR, no murmurs. No LE edema.  Respiratory: Respirations even and unlabored. Normal respiratory effort. Lungs CTA bilaterally, no wheezes / rales .   Abdomen: soft, non-distended, non-tender, active bowel sounds. No obvious masses.  Skin: no rash seen on limited exam Musculoskeletal: grossly normal tone BUE/BLE Psychiatric: grossly normal mood and affect, speech fluent and appropriate Neurologic: grossly non-focal.         LABS ON ADMISSION:    Basic Metabolic Panel:  Recent Labs Lab 08/30/15 0904  NA 143  K 3.5  CL 104  CO2 25  GLUCOSE 142*  BUN 17  CREATININE 1.63*  CALCIUM 9.4  MG 2.5*   Liver Function Tests:  Recent Labs Lab 08/30/15 0904  AST 29  ALT 27  ALKPHOS 138*  BILITOT 0.4  PROT 6.6  ALBUMIN 3.7    CBC:  Recent Labs Lab 08/30/15 0904  WBC 7.4  NEUTROABS 5.8  HGB 12.4*  HCT 39.4  MCV 89.7  PLT 279    Creatinine clearance cannot be calculated (Unknown ideal weight.)  Radiological Exams on Admission: Mr Brain Wo Contrast  08/30/2015  CLINICAL DATA:  Patient awoke with confusion.  Blurred vision. EXAM: MRI HEAD WITHOUT CONTRAST TECHNIQUE: Multiplanar, multiecho pulse sequences of the brain and surrounding structures were obtained without intravenous contrast. COMPARISON:  MR brain 05/02/2014. FINDINGS: Small foci of restricted diffusion affect the RIGHT occipital  cortex and subcortical white matter, consistent with acute infarction. No visible acute hemorrhage, mass lesion or extra-axial fluid. Global atrophy with hydrocephalus ex vacuo. Extensive white matter disease. No midline abnormality. Large remote RIGHT cerebellar infarct. Scattered deep white matter lacunar infarcts. No chronic hemorrhage. Flow voids are maintained. LEFT vertebral dominant. BILATERAL cataract extraction. No sinus or mastoid air fluid level. IMPRESSION: Small foci of acute infarction affecting the RIGHT occipital lobe. No associated hemorrhage. Chronic changes as described. Electronically Signed   By: Jenny Reichmann  Alfonse Flavors M.D.   On: 08/30/2015 13:25    ASSESSMENT / PLAN   CVA, right occipital. Hx of CVA in 2010.  -admit to Telemetry for stroke workup - CVA order set utilized -TEE -Neurology evaluating. Feels CVA most likely secondary to HTN rather than cardioembolic in nature and  that escalation of ASA to alternate regimen is not indicated.  Depression. Stable -continue home Prozac  AKI (acute kidney injury).  -holding ACEI -avoid nephrotoxic medications -patient has been NPO all day, will start IVF at 67m / hr -am BMET  Hypertension. -Hold anti-hypertensives for now -permissive hypertension during CVA evaluation  Hyperlipidemia.  -continue home statin    CONSULTANTS:    Neurology  Code Status: full code DVT Prophylaxis: Lovenox  Family Communication:  Patient alert, oriented and understands plan of care.  Disposition Plan:  Discharge to home in 24-48 hours   Time spent: 60 minutes PTye Savoy NP [facility] Pager [phone]

## 2015-08-30 NOTE — Consult Note (Signed)
NEURO HOSPITALIST CONSULT NOTE   Requestig physician: Dr. Allyson Sabal   Reason for Consult: Stroke.  HPI:                                                                                                                                          Jason [name] is an 70 y.o. male who presented for further evaluation of limb incoordination, confusion and blurred vision. Initially, the patient felt that this may have been due to having taken too much of his Vicodin, stating he felt cognitive effects not dissimilar to being "high". He also had a period of blacking out for which he has no memory but suspects that he may have abused one or more of his medications at that time. He felt that his limbs were moving uncontrollably - when he tried to grab something "my hand went all over the place". This symptom was present in all four extremities. He now feels nearly back to baseline. Complains of a mild headache. He was nauseated at one point but did not vomit. He denies fevers, chills, abdominal pain, chest pain or limb pain.   He has a PMHx of stroke, HTN, bipolar 1 disorder and depression.   Past Medical History  Diagnosis Date  . Hypertension   . Stroke ([place])   . Prostate cancer ([place])   . Depression   . Bipolar 1 disorder ([place])   . Acid reflux disease with ulcer     Past Surgical History  Procedure Laterality Date  . Prostate surgery    . Back surgery      Family History  Problem Relation Age of Onset  . Stroke Mother     Hemorrhagic stroke  . Cancer Father     Pancreatic cancer  . Hypertension Father     Social History:  reports that he quit smoking about 12 years ago. He has never used smokeless tobacco. He reports that he does not drink alcohol or use illicit drugs. He now uses e-cigarettes.   Allergies  Allergen Reactions  . Penicillins     Unknown reaction Has patient had a PCN reaction causing immediate rash, facial/tongue/throat swelling, SOB or  lightheadedness with hypotension: Unknown Has patient had a PCN reaction causing severe rash involving mucus membranes or skin necrosis: Unknown Has patient had a PCN reaction that required hospitalization: No Has patient had a PCN reaction occurring within the last 10 years: No If all of the above answers are "NO", then may proceed with Cephalosporin use.     MEDICATIONS:  I have reviewed the patient's current home medications.   ROS:                                                                                                                                       History obtained from patient. ROS as per HPI.   Blood pressure 163/85, pulse 71, temperature 97.6 F (36.4 C), temperature source Oral, resp. rate 18, SpO2 94 %.  Neurologic Examination:                                                                                                      HEENT-  Normocephalic, no lesions, without obvious abnormality.  Normal external eye and conjunctiva.   Extremities - Warm and well-perfused.   Neurological Examination Mental Status: Alert, oriented, thought content appropriate.  Mildly anxious affect. Speech fluent without evidence of aphasia.  Able to follow all commands without difficulty. Cranial Nerves: II - Visual fields grossly normal, pupils equal, round, reactive to light and accommodation. Visible sequelae of prior cataract surgeries noted.  III,IV, VI: ptosis not present, extra-ocular motions intact bilaterally without nystagmus.  V,VII: smile symmetric, facial light touch sensation normal bilaterally VIII: hearing intact to conversation. IX,X: No dysphonia XI: bilateral shoulder shrug equal XII: midline tongue extension Motor: Right : Upper extremity   5/5    Left:     Upper extremity   5/5  Lower extremity   5/5     Lower extremity   5/5 Tone and bulk:normal  tone throughout; no atrophy noted Sensory: Light touch intact throughout, with no asymmetry noted. Deep Tendon Reflexes: 2+ and symmetric throughout Plantars: Right: downgoing   Left: downgoing Cerebellar: No ataxia on finger-to-nose Gait: Normal gait and station  Lab Results: Basic Metabolic Panel:  Recent Labs Lab 08/30/15 0904  NA 143  K 3.5  CL 104  CO2 25  GLUCOSE 142*  BUN 17  CREATININE 1.63*  CALCIUM 9.4  MG 2.5*    Liver Function Tests:  Recent Labs Lab 08/30/15 0904  AST 29  ALT 27  ALKPHOS 138*  BILITOT 0.4  PROT 6.6  ALBUMIN 3.7   No results for input(s): LIPASE, AMYLASE in the last 168 hours. No results for input(s): AMMONIA in the last 168 hours.  CBC:  Recent Labs Lab 08/30/15 0904  WBC 7.4  NEUTROABS 5.8  HGB 12.4*  HCT 39.4  MCV 89.7  PLT 279    Cardiac Enzymes: No results for input(s): CKTOTAL, CKMB, CKMBINDEX, TROPONINI in  the last 168 hours.  Lipid Panel: No results for input(s): CHOL, TRIG, HDL, CHOLHDL, VLDL, LDLCALC in the last 168 hours.  CBG:  Recent Labs Lab 08/30/15 1119  GLUCAP 102*    Microbiology: Results for orders placed or performed during the hospital encounter of 05/09/14  Urine culture     Status: None   Collection Time: 05/09/14  9:15 PM  Result Value Ref Range Status   Specimen Description URINE, CLEAN CATCH  Final   Special Requests NONE  Final   Culture  Setup Time   Final    05/10/2014 04:00 Performed at [facility]   Final    75,000 COLONIES/ML Performed at [facility]    Culture   Final    KLEBSIELLA PNEUMONIAE Performed at [facility]    Report Status 05/13/2014 FINAL  Final   Organism ID, Bacteria KLEBSIELLA PNEUMONIAE  Final      Susceptibility   Klebsiella pneumoniae - MIC*    AMPICILLIN >=32 RESISTANT Resistant     CEFAZOLIN <=4 SENSITIVE Sensitive     CEFTRIAXONE <=1 SENSITIVE Sensitive     CIPROFLOXACIN <=0.25 SENSITIVE Sensitive      GENTAMICIN <=1 SENSITIVE Sensitive     LEVOFLOXACIN <=0.12 SENSITIVE Sensitive     NITROFURANTOIN 32 SENSITIVE Sensitive     TOBRAMYCIN <=1 SENSITIVE Sensitive     TRIMETH/SULFA <=20 SENSITIVE Sensitive     PIP/TAZO <=4 SENSITIVE Sensitive     * KLEBSIELLA PNEUMONIAE   No current facility-administered medications on file prior to encounter.   Current Outpatient Prescriptions on File Prior to Encounter  Medication Sig Dispense Refill  . aspirin EC 325 MG tablet Take 325 mg by mouth daily.    Marland Kitchen atorvastatin (LIPITOR) 40 MG tablet Take 1 tablet (40 mg total) by mouth daily at 6 PM. 30 tablet 0  . FLUoxetine (PROZAC) 20 MG capsule Take 60 mg by mouth daily.    . mirtazapine (REMERON) 45 MG tablet Take 45 mg by mouth at bedtime.    Marland Kitchen omeprazole (PRILOSEC) 20 MG capsule Take 20 mg by mouth 2 (two) times daily before a meal.       Coagulation Studies: No results for input(s): LABPROT, INR in the last 72 hours.  Imaging: Mr Herby Abraham Contrast  08/30/2015  CLINICAL DATA:  Patient awoke with confusion.  Blurred vision. EXAM: MRI HEAD WITHOUT CONTRAST TECHNIQUE: Multiplanar, multiecho pulse sequences of the brain and surrounding structures were obtained without intravenous contrast. COMPARISON:  MR brain 05/02/2014. FINDINGS: Small foci of restricted diffusion affect the RIGHT occipital cortex and subcortical white matter, consistent with acute infarction. No visible acute hemorrhage, mass lesion or extra-axial fluid. Global atrophy with hydrocephalus ex vacuo. Extensive white matter disease. No midline abnormality. Large remote RIGHT cerebellar infarct. Scattered deep white matter lacunar infarcts. No chronic hemorrhage. Flow voids are maintained. LEFT vertebral dominant. BILATERAL cataract extraction. No sinus or mastoid air fluid level. IMPRESSION: Small foci of acute infarction affecting the RIGHT occipital lobe. No associated hemorrhage. Chronic changes as described. Electronically Signed   By: Staci Righter M.D.   On: 08/30/2015 13:25   Assessment: 1. Acute onset of incoordination, visual blurring and confusion in conjunction with headache and nausea. 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 MRI reveals small foci of restricted diffusion affect the right occipital cortex and subcortical white matter, consistent with acute  infarction. Small focal infarctions can occur secondary to vasospasm and/or increased ICP in the setting of malignant hypertension. 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 The overall appearance of the findings is not suggestive of cardioembolic strokes. An old right PICA territory cerebellar stroke is also seen on MRI. Takes ASA at home. 2. MRI findings of global atrophy with hydrocephalus ex vacuo, as well as extensive white matter disease, appear chronic.  3. Prior history of stroke. Most likely corresponding to the old right PICA infarct seen on MRI.   4. Poorly controlled HTN.  5. Anxiety.  6. Bipolar disorder.   Recommendations: 1. Admit for stroke work up. Obtain MRA of head, carotid ultrasound and TTE. Given that malignant HTN is the most likely etiology for his new strokes, escalation of ASA to an alternate regimen is not indicated.  2. Continue atorvastatin.  3. Will need to keep a BP diary at home, take accurate readings 3x per day and present this data to his PCP at regular intervals to optimize his BP meds.  4. PT/OT/Speech consults.   Kerney Elbe, MD 08/30/2015, 2:50 PM

## 2015-08-30 NOTE — Progress Notes (Signed)
Pt admitted to the unit at 1831. Pt mental status is A&Ox4. Pt oriented to room, staff, and call bell. Skin is intact except where otherwise charted. Full assessment charted in [facility]. Call bell within reach. Visitor guidelines reviewed w/ pt and/or family.

## 2015-08-30 NOTE — ED Notes (Signed)
Per [facility] patient from home, states he woke up at 0330 today and felt confused.  Patient complains of blurred vision.  Patient states he think he took too much of his pain medicine (po 5/325 vicodin).  Patient states "it feels like I'm high".  Alert and oriented and in no apparent distress at this time.

## 2015-08-31 ENCOUNTER — Inpatient Hospital Stay (HOSPITAL_COMMUNITY): Payer: Medicare Other

## 2015-08-31 DIAGNOSIS — I639 Cerebral infarction, unspecified: Secondary | ICD-10-CM

## 2015-08-31 DIAGNOSIS — F329 Major depressive disorder, single episode, unspecified: Secondary | ICD-10-CM

## 2015-08-31 DIAGNOSIS — N179 Acute kidney failure, unspecified: Secondary | ICD-10-CM

## 2015-08-31 DIAGNOSIS — E785 Hyperlipidemia, unspecified: Secondary | ICD-10-CM

## 2015-08-31 DIAGNOSIS — E782 Mixed hyperlipidemia: Secondary | ICD-10-CM | POA: Diagnosis present

## 2015-08-31 DIAGNOSIS — I6789 Other cerebrovascular disease: Secondary | ICD-10-CM

## 2015-08-31 LAB — LIPID PANEL
CHOLESTEROL: 206 mg/dL — AB (ref 0–200)
HDL: 20 mg/dL — ABNORMAL LOW (ref >40)
LDL CALC: 139 mg/dL — AB (ref 0–99)
TRIGLYCERIDES: 236 mg/dL — AB (ref <150)
Total CHOL/HDL Ratio: 10.3 RATIO
VLDL: 47 mg/dL — AB (ref 0–40)

## 2015-08-31 LAB — BASIC METABOLIC PANEL
Anion gap: 9 (ref 5–15)
BUN: 15 mg/dL (ref 6–20)
CALCIUM: 8.9 mg/dL (ref 8.9–10.3)
CO2: 27 mmol/L (ref 22–32)
CREATININE: 1.25 mg/dL — AB (ref 0.61–1.24)
Chloride: 108 mmol/L (ref 101–111)
GFR calc non Af Amer: 57 mL/min — ABNORMAL LOW (ref >60)
Glucose, Bld: 103 mg/dL — ABNORMAL HIGH (ref 65–99)
Potassium: 3.7 mmol/L (ref 3.5–5.1)
SODIUM: 144 mmol/L (ref 135–145)

## 2015-08-31 MED ORDER — ATORVASTATIN CALCIUM 80 MG PO TABS
80.0000 mg | ORAL_TABLET | Freq: Every day | ORAL | Status: DC
  Administered 2015-08-31: 80 mg via ORAL
  Filled 2015-08-31: qty 1

## 2015-08-31 NOTE — Progress Notes (Signed)
PROGRESS NOTE  Jason [name] TIR:[id number] DOB: [birth year] DOA: 08/30/2015 PCP: Verline Lema, MD Outpatient Specialists:    LOS: 1 day   Brief Narrative: 70 year old male with HTN, HLD, prior CVA, who presents to the ER after developing limb incoordination, confusion and blurry vision. Patient found to have an acute CVA, neurology has been consulted   Assessment & Plan: Active Problems:   Depression   AKI (acute kidney injury) ([place])   CVA (cerebral infarction)   HLD (hyperlipidemia)   CVA, right occipital  - Hx of CVA in 2010.  - 2d echo pending, carotids pending - PT recommending no PT follow up  Depression  - stable - continue home Prozac  AKI (acute kidney injury) - improved with fluids on this morning's labs - good po intake, hold fluids  Hypertension - Hold anti-hypertensives for now -permissive hypertension   Hyperlipidemia - continue home statin   DVT prophylaxis: Lovenox Code Status: Full Family Communication: no family bedside Disposition Plan: home when ready  Barriers for discharge: stroke workup  Consultants:   Neurology   Procedures:   2D echo: pending  Carotids: pending  Antimicrobials:  None    Subjective: - feeling good, denies weakness, slight residual vision changes on left 0  Objective: Filed Vitals:   08/31/15 0600 08/31/15 0800 08/31/15 1000 08/31/15 1107  BP: 147/88 166/85 178/94 187/82  Pulse: 82 75 86 83  Temp: 98 F (36.7 C)     TempSrc: Oral     Resp: 16  16   SpO2: 94%       Intake/Output Summary (Last 24 hours) at 08/31/15 1438 Last data filed at 08/31/15 1345  Gross per 24 hour  Intake 1347.5 ml  Output   1075 ml  Net  272.5 ml   There were no vitals filed for this visit.  Examination: BP 187/82 mmHg  Pulse 83  Temp(Src) 98 F (36.7 C) (Oral)  Resp 16  SpO2 94% General exam: NAD Respiratory system: Clear. No increased work of breathing. No wheezing, no crackles Cardiovascular system:  regular rate and rhythm, no murmurs, gallops. No JVD. No peripheral edema.  Gastrointestinal system: Abdomen is nondistended, soft and nontender. Normal bowel sounds heard. Central nervous system: AxOx3. No focal deficits Extremities: No clubbing/cyanosis Skin: no rashes   Data Reviewed: I have personally reviewed following labs and imaging studies  CBC:  Recent Labs Lab 08/30/15 0904  WBC 7.4  NEUTROABS 5.8  HGB 12.4*  HCT 39.4  MCV 89.7  PLT 676   Basic Metabolic Panel:  Recent Labs Lab 08/30/15 0904 08/31/15 0525  NA 143 144  K 3.5 3.7  CL 104 108  CO2 25 27  GLUCOSE 142* 103*  BUN 17 15  CREATININE 1.63* 1.25*  CALCIUM 9.4 8.9  MG 2.5*  --    GFR: CrCl cannot be calculated (Unknown ideal weight.). Liver Function Tests:  Recent Labs Lab 08/30/15 0904  AST 29  ALT 27  ALKPHOS 138*  BILITOT 0.4  PROT 6.6  ALBUMIN 3.7   No results for input(s): LIPASE, AMYLASE in the last 168 hours. No results for input(s): AMMONIA in the last 168 hours. Coagulation Profile: No results for input(s): INR, PROTIME in the last 168 hours. Cardiac Enzymes: No results for input(s): CKTOTAL, CKMB, CKMBINDEX, TROPONINI in the last 168 hours. BNP (last 3 results) No results for input(s): PROBNP in the last 8760 hours. HbA1C: No results for input(s): HGBA1C in the last 72 hours. CBG:  Recent Labs Lab  08/30/15 1119  GLUCAP 102*   Lipid Profile:  Recent Labs  08/31/15 0525  CHOL 206*  HDL 20*  LDLCALC 139*  TRIG 236*  CHOLHDL 10.3   Thyroid Function Tests: No results for input(s): TSH, T4TOTAL, FREET4, T3FREE, THYROIDAB in the last 72 hours. Anemia Panel: No results for input(s): VITAMINB12, FOLATE, FERRITIN, TIBC, IRON, RETICCTPCT in the last 72 hours. Urine analysis:    Component Value Date/Time   COLORURINE YELLOW 05/09/2014 2115   APPEARANCEUR CLEAR 05/09/2014 2115   LABSPEC 1.023 05/09/2014 2115   PHURINE 5.0 05/09/2014 2115   GLUCOSEU NEGATIVE  05/09/2014 2115   HGBUR NEGATIVE 05/09/2014 2115   BILIRUBINUR NEGATIVE 05/09/2014 2115   KETONESUR NEGATIVE 05/09/2014 2115   PROTEINUR NEGATIVE 05/09/2014 2115   UROBILINOGEN 0.2 05/09/2014 2115   NITRITE NEGATIVE 05/09/2014 2115   LEUKOCYTESUR NEGATIVE 05/09/2014 2115   Sepsis Labs: Invalid input(s): PROCALCITONIN, LACTICIDVEN  No results found for this or any previous visit (from the past 240 hour(s)).    Radiology Studies: Dg Chest 2 View  08/30/2015  CLINICAL DATA:  CVA EXAM: CHEST  2 VIEW COMPARISON:  05/09/2014 FINDINGS: The heart size and mediastinal contours are within normal limits. Both lungs are clear. The visualized skeletal structures are unremarkable. IMPRESSION: No active cardiopulmonary disease. Electronically Signed   By: Rolm Baptise M.D.   On: 08/30/2015 17:17   Mr Brain Wo Contrast  08/30/2015  CLINICAL DATA:  Patient awoke with confusion.  Blurred vision. EXAM: MRI HEAD WITHOUT CONTRAST TECHNIQUE: Multiplanar, multiecho pulse sequences of the brain and surrounding structures were obtained without intravenous contrast. COMPARISON:  MR brain 05/02/2014. FINDINGS: Small foci of restricted diffusion affect the RIGHT occipital cortex and subcortical white matter, consistent with acute infarction. No visible acute hemorrhage, mass lesion or extra-axial fluid. Global atrophy with hydrocephalus ex vacuo. Extensive white matter disease. No midline abnormality. Large remote RIGHT cerebellar infarct. Scattered deep white matter lacunar infarcts. No chronic hemorrhage. Flow voids are maintained. LEFT vertebral dominant. BILATERAL cataract extraction. No sinus or mastoid air fluid level. IMPRESSION: Small foci of acute infarction affecting the RIGHT occipital lobe. No associated hemorrhage. Chronic changes as described. Electronically Signed   By: Staci Righter M.D.   On: 08/30/2015 13:25   Mr Jodene Nam Head/brain Wo Cm  08/30/2015  CLINICAL DATA:  Patient awoke with confusion. Blurred  vision. Acute RIGHT occipital infarction on MR earlier today. EXAM: MRA HEAD WITHOUT CONTRAST TECHNIQUE: Angiographic images of the Circle of Willis were obtained using MRA technique without intravenous contrast. COMPARISON:  MRI brain 08/30/2015.  MRI brain and MRA 05/02/2014. FINDINGS: Internal carotid arteries show no stenosis or occlusion. Sessile 3 mm outpouching from the RIGHT ICA, inferior cavernous segment, representing atherosclerotic disease. Basilar artery widely patent with LEFT vertebral dominant. Severely diseased, but patent RIGHT vertebral does not contribute meaningfully to the basilar. There is no proximal stenosis of the anterior, middle, or posterior cerebral arteries. No cerebellar branch occlusion. Similar appearance to priors. IMPRESSION: No proximal intracranial stenosis or dissection is observed. RIGHT PCA widely patent. Electronically Signed   By: Staci Righter M.D.   On: 08/30/2015 16:43     Scheduled Meds: . aspirin EC  325 mg Oral Daily  . atorvastatin  80 mg Oral q1800  . enoxaparin (LOVENOX) injection  40 mg Subcutaneous Q24H  . FLUoxetine  60 mg Oral Daily  . gabapentin  300 mg Oral BID  . methocarbamol  750 mg Oral QHS  . mirtazapine  45 mg Oral QHS  .  pantoprazole  40 mg Oral Daily   Continuous Infusions: . sodium chloride 75 mL/hr at 08/31/15 0414     Marzetta Board, MD, PhD [facility] Pager [phone]  If 7PM-7AM, please contact night-coverage [email or web address] Password [facility] 08/31/2015, 2:38 PM

## 2015-08-31 NOTE — Progress Notes (Signed)
*  PRELIMINARY RESULTS* Vascular Ultrasound Carotid Duplex (Doppler) has been completed.  The right internal carotid artery exhibits low range 40-59% stenosis. The right vertebral artery exhibits an atypical bidirectional waveform. The right subclavian artery appears dampened when compared to the left subclavian artery.   The left internal carotid artery exhibits low range 80-99% stenosis. The left vertebral artery is patent with antegrade flow.  08/31/2015 4:48 PM Maudry Mayhew, RVT, RDCS, RDMS

## 2015-08-31 NOTE — Progress Notes (Signed)
Utilization review completed.  

## 2015-08-31 NOTE — Progress Notes (Signed)
  Echocardiogram 2D Echocardiogram has been performed.  Jason [name] 08/31/2015, 5:44 PM

## 2015-08-31 NOTE — Evaluation (Signed)
Physical Therapy Evaluation Patient Details Name: Jason [name] MRN: [id number] DOB: 11-17-1945 Today's Date: 08/31/2015   History of Present Illness  Pt is a married 70 yo male who had sudden difficulty with walking, hand movement and vision change at unknown exact time after he got up early in am.  He has chronic ongoing LBP with occasional Rt radicular pain into mid part of leg. 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 Small foci of acute infarction affecting the RIGHT occipital lobe  Clinical Impression  Pt reports that he is at baseline level of mobility and he feels he has near full resolution of his symptoms with exception of his vision.  No need for further PT services, may mobilize with nursing on unit prn. Pt and nursing agree with plan.    Follow Up Recommendations No PT follow up    Equipment Recommendations  None recommended by PT    Recommendations for Other Services       Precautions / Restrictions Precautions Precautions: None Precaution Comments: reports small vision loss "disk" in the middle of his visual field but is learning to quickly adapt by turning his head Restrictions Weight Bearing Restrictions: No      Mobility  Bed Mobility Overal bed mobility: Independent             General bed mobility comments: sit to/from supine  Transfers Overall transfer level: Independent Equipment used: Straight cane                Ambulation/Gait Ambulation/Gait assistance: Modified independent (Device/Increase time) Ambulation Distance (Feet): 300 Feet Assistive device: Straight cane Gait Pattern/deviations: Shuffle;Decreased stride length     General Gait Details: pt reports his walking is at baseline performance  Stairs            Wheelchair Mobility    Modified Rankin (Stroke Patients Only) Modified Rankin (Stroke Patients Only) Pre-Morbid  Rankin Score: No significant disability Modified Rankin: No significant disability     Balance Overall balance assessment: Modified Independent                                           Pertinent Vitals/Pain Pain Assessment: 0-10 Pain Score: 4  Pain Location: low back Pain Descriptors / Indicators: Aching;Sore Pain Intervention(s): Limited activity within patient's tolerance;Monitored during session    [place] expects to be discharged to:: Private residence Living Arrangements: Spouse/significant other;Children Available Help at Discharge: Family;Available 24 hours/day Type of Home: House Home Access: Stairs to enter Entrance Stairs-Rails: None Entrance Stairs-Number of Steps: 1 Home Layout: Two level Home Equipment: Walker - 2 wheels;Cane - single point;Tub bench Additional Comments: has not used RW in quite some time    Prior Function Level of Independence: Independent with assistive device(s)         Comments: cane for household and community mobility     Hand Dominance   Dominant Hand: Right    Extremity/Trunk Assessment   Upper Extremity Assessment: Overall WFL for tasks assessed (4/5 Rt shoulder flexion, abduct, 4+/5 otherwise bil)           Lower Extremity Assessment: Overall WFL for tasks assessed (4+/5 bil hip flexors, 5/5 knee ext, ankle DF bil)      Cervical / Trunk Assessment: Normal  Communication   Communication: No difficulties  Cognition Arousal/Alertness: Awake/alert Behavior During Therapy: WFL for tasks assessed/performed Overall Cognitive Status: Within Functional Limits for tasks assessed                      General Comments General comments (skin integrity, edema, etc.): utilizes glasses. post exercise HR and BP taken, see flowsheet    Exercises        Assessment/Plan    PT Assessment Patent does not need any further PT services  PT Diagnosis Difficulty walking   PT Problem  List    PT Treatment Interventions     PT Goals (Current goals can be found in the Care Plan section) Acute Rehab PT Goals Patient Stated Goal: get discharged home today PT Goal Formulation: With patient    Frequency     Barriers to discharge        Co-evaluation               End of Session Equipment Utilized During Treatment: Gait belt Activity Tolerance: Patient tolerated treatment well Patient left: in bed;with call bell/phone within reach;with nursing/sitter in room Nurse Communication: Mobility status         Time: 9444-6190 PT Time Calculation (min) (ACUTE ONLY): 34 min   Charges:   PT Evaluation $PT Eval Low Complexity: 1 Procedure PT Treatments $Gait Training: 8-22 mins   PT G CodesMalka So, PT [phone]  Box 08/31/2015, 11:22 AM

## 2015-08-31 NOTE — Progress Notes (Addendum)
STROKE TEAM PROGRESS NOTE   HISTORY OF PRESENT ILLNESS  (at the time of admission) Jason [name] is a 70 y.o. male who presented for further evaluation of limb incoordination, confusion and blurred vision. Initially, the patient felt that this may have been due to having taken too much of his Vicodin, stating he felt cognitive effects not dissimilar to being "high". He also had a period of blacking out for which he has no memory but suspects that he may have abused one or more of his medications at that time. He felt that his limbs were moving uncontrollably - when he tried to grab something "my hand went all over the place". This symptom was present in all four extremities. He now feels nearly back to baseline. Complains of a mild headache. He was nauseated at one point but did not vomit. He denies fevers, chills, abdominal pain, chest pain or limb pain.   He has a PMHx of stroke, HTN, bipolar 1 disorder and depression.    SUBJECTIVE (INTERVAL HISTORY) His family was not at the bedside.  Overall he feels his condition is gradually improving. He reports that he continues to "have difficulty seeing."  He noted that he sometimes cannot see the first letter of words.  He expressed concern that the onset of the event may have been associated with a loss of time for an unknown period (perhaps minutes to hours).  Apparently 1-2 months ago the patient had an ultrasound that revealed he "had a problem with the blood vessels on the left side of the neck."  This study was done at the [place].  He was told to follow-up and undergo a CT of the blood vessels but he failed to do so.  ROS was otherwise completely negative.  Of note, patient reports that he snores loudly.  OBJECTIVE Temp:  [97.7 F (36.5 C)-98.4 F (36.9 C)] 98 F (36.7 C) (02/26 0600) Pulse Rate:  [71-86] 86 (02/26 1000) Cardiac Rhythm:  [-] Normal sinus rhythm (02/25 2039) Resp:  [11-22] 16 (02/26 1000) BP: (147-199)/(67-98) 178/94 mmHg (02/26  1000) SpO2:  [93 %-97 %] 94 % (02/26 0600)  CBC:  Recent Labs Lab 08/30/15 0904  WBC 7.4  NEUTROABS 5.8  HGB 12.4*  HCT 39.4  MCV 89.7  PLT 323    Basic Metabolic Panel:  Recent Labs Lab 08/30/15 0904 08/31/15 0525  NA 143 144  K 3.5 3.7  CL 104 108  CO2 25 27  GLUCOSE 142* 103*  BUN 17 15  CREATININE 1.63* 1.25*  CALCIUM 9.4 8.9  MG 2.5*  --     Lipid Panel:    Component Value Date/Time   CHOL 206* 08/31/2015 0525   TRIG 236* 08/31/2015 0525   HDL 20* 08/31/2015 0525   CHOLHDL 10.3 08/31/2015 0525   VLDL 47* 08/31/2015 0525   LDLCALC 139* 08/31/2015 0525   HgbA1c:  Lab Results  Component Value Date   HGBA1C 5.6 04/30/2014   Urine Drug Screen:    Component Value Date/Time   LABOPIA POSITIVE* 05/01/2014 0330   COCAINSCRNUR NONE DETECTED 05/01/2014 0330   LABBENZ NONE DETECTED 05/01/2014 0330   AMPHETMU NONE DETECTED 05/01/2014 0330   THCU NONE DETECTED 05/01/2014 0330   LABBARB NONE DETECTED 05/01/2014 0330      IMAGING  Dg Chest 2 View 08/30/2015   No active cardiopulmonary disease.   Mr Brain Wo Contrast 08/30/2015   Small foci of acute infarction affecting the RIGHT occipital lobe. No associated hemorrhage. Chronic  changes as described.   Mr Jodene Nam Head/brain Wo Cm 08/30/2015   No proximal intracranial stenosis or dissection is observed. RIGHT PCA widely patent.   PHYSICAL EXAM HEENT- Normocephalic, no lesions, without obvious abnormality. Normal external eye and conjunctiva. Neck supple Card:  RRR, no M/R/G  Chest:  CTA Abd:  benign Extremities - Warm and well-perfused.   Neurological Examination Mental Status: Alert, oriented, thought content appropriate. Mildly anxious affect. Speech fluent without evidence of aphasia. Able to follow all commands without difficulty. Cranial Nerves: II - Visual field testing reveals subtle aspects of left visual field loss (not a full homonymous hemianopsia), pupils equal, round, reactive to light  and accommodation. Visible sequelae of prior cataract surgeries noted.  III,IV, VI: ptosis not present, extra-ocular motions intact bilaterally without nystagmus.  V,VII: smile symmetric, facial light touch sensation normal bilaterally VIII: hearing intact to conversation. IX,X: No dysphonia XI: bilateral shoulder shrug equal XII: midline tongue extension  Motor: Right :Upper extremity 5/5Left: Upper extremity 5/5 Lower extremity 5/5Lower extremity 5/5  Tone and bulk:normal tone throughout; no atrophy noted  Sensory: Light touch intact throughout, with no asymmetry noted. Cerebellar: No ataxia on finger-to-nose Gait: deferred  ASSESSMENT/PLAN Mr. Jason [name] is a 70 y.o. male with history of hypertension, previous stroke, and bipolar disorder, presenting with nausea, limb incoordination, confusion and blurred vision. He did not receive IV t-PA due to resolution of deficits.  Stroke:  Non-dominant infarct secondary to small vessel disease.  Resultant  Improving visual field deficits.  MRI  Small foci of acute infarction affecting the RIGHT occipital lobe  MRA  Unremarkable  Carotid Doppler - pending  2D Echo  pending  LDL - 139  HgbA1c pending  VTE prophylaxis - Lovenox  Diet Heart Room service appropriate?: Yes; Fluid consistency:: Thin  aspirin 325 mg daily prior to admission, now on aspirin 325 mg daily - Consider change to Plavix  Patient counseled to be compliant with his antithrombotic medications  Ongoing aggressive stroke risk factor management  Therapy recommendations: No follow-up physical therapy recommended  Disposition: Pending  Hypertension  Blood pressure somewhat high (the patient was on lisinopril and Minipress prior to admission)  Permissive hypertension (OK if < 220/120) but gradually normalize in 5-7  days  Hyperlipidemia  Home meds: Lipitor 40 mg daily resumed.  LDL 139, goal < 70  Increase Lipitor to 80 mg daily.  Continue statin at discharge  Other Stroke Risk Factors  Advanced age  Cigarette smoker, quit smoking 12 years ago.  Obesity, There is no weight on file to calculate BMI.   Hx stroke/TIA  Family hx stroke (Mother)   Other Active Problems  Bipolar disorder  Mildly elevated creatinine 1.25  Consider changing aspirin to Plavix  Hospital day # 1  Mikey Bussing PA-C [facility] Pager [phone] 08/31/2015, 1:08 PM  ATTENDING NOTE: Patient was seen and examined by me personally. Documentation reflects findings. The laboratory and radiographic studies reviewed by me. ROS completed by me personally and pertinent positives fully documented  Condition: Stable  Assessment and plan completed by me personally and fully documented above. Plans/Recommendations include:     Continue PT/OT as needed to address safety in the setting of viual changes  Increase Lipitor  Stroke work-up underway; if possible get records from [place] to determine cause of referral for CTA (ordered)  Patient will need an outpatient sleep study  Will follow   SIGNED BY: Dr. Elissa Hefty    To contact Stroke Continuity provider,  please refer to [email or web address] After hours, contact General Neurology

## 2015-09-01 ENCOUNTER — Inpatient Hospital Stay (HOSPITAL_COMMUNITY): Payer: Medicare Other

## 2015-09-01 ENCOUNTER — Encounter (HOSPITAL_COMMUNITY): Payer: Self-pay | Admitting: Radiology

## 2015-09-01 DIAGNOSIS — I63439 Cerebral infarction due to embolism of unspecified posterior cerebral artery: Secondary | ICD-10-CM

## 2015-09-01 DIAGNOSIS — I6529 Occlusion and stenosis of unspecified carotid artery: Secondary | ICD-10-CM | POA: Insufficient documentation

## 2015-09-01 DIAGNOSIS — I639 Cerebral infarction, unspecified: Secondary | ICD-10-CM

## 2015-09-01 LAB — RAPID URINE DRUG SCREEN, HOSP PERFORMED
AMPHETAMINES: NOT DETECTED
BARBITURATES: NOT DETECTED
Benzodiazepines: NOT DETECTED
Cocaine: NOT DETECTED
OPIATES: NOT DETECTED
TETRAHYDROCANNABINOL: NOT DETECTED

## 2015-09-01 LAB — HEMOGLOBIN A1C
Hgb A1c MFr Bld: 5.1 % (ref 4.8–5.6)
Mean Plasma Glucose: 100 mg/dL

## 2015-09-01 MED ORDER — HYDRALAZINE HCL 20 MG/ML IJ SOLN
10.0000 mg | Freq: Once | INTRAMUSCULAR | Status: AC
  Administered 2015-09-01: 10 mg via INTRAVENOUS
  Filled 2015-09-01: qty 1

## 2015-09-01 MED ORDER — IOHEXOL 350 MG/ML SOLN
50.0000 mL | Freq: Once | INTRAVENOUS | Status: AC | PRN
  Administered 2015-09-01: 50 mL via INTRAVENOUS

## 2015-09-01 MED ORDER — OXYCODONE-ACETAMINOPHEN 5-325 MG PO TABS: 1.0000 | ORAL_TABLET | ORAL | Status: DC | PRN

## 2015-09-01 MED ORDER — CLOPIDOGREL BISULFATE 75 MG PO TABS: 75.0000 mg | ORAL_TABLET | Freq: Every day | ORAL | Status: DC

## 2015-09-01 MED ORDER — CLOPIDOGREL BISULFATE 75 MG PO TABS
75.0000 mg | ORAL_TABLET | Freq: Every day | ORAL | Status: DC
  Administered 2015-09-01: 75 mg via ORAL
  Filled 2015-09-01: qty 1

## 2015-09-01 MED ORDER — ASPIRIN EC 81 MG PO TBEC: 81.0000 mg | DELAYED_RELEASE_TABLET | Freq: Every day | ORAL | Status: DC

## 2015-09-01 NOTE — Care Management Note (Signed)
Case Management Note  Patient Details  Name: Jason [name] MRN: [id number] Date of Birth: 26-Apr-1946  Subjective/Objective:                 Spoke to patient and wife at bedside. They decline needing any DME, they have multiple walkers, a bsc, and patient uses a cane. They deny any barriers to getting their medication. Patient and wife agreed that she can drive patient to appointments etc after discharge. PT has cleared, no f/u. P{ateint and wife believe he will be Rx plavix at DC. Patient awaiting U/S to r/o DVT.   Action/Plan:  No CM identified at this time.  Expected Discharge Date:                  Expected Discharge Plan:  Home/Self Care  In-House Referral:     Discharge planning Services  CM Consult  Post Acute Care Choice:    Choice offered to:     DME Arranged:    DME Agency:     HH Arranged:    [place] Agency:     Status of Service:  Completed, signed off  Medicare Important Message Given:    Date Medicare IM Given:    Medicare IM give by:    Date Additional Medicare IM Given:    Additional Medicare Important Message give by:     If discussed at [place] of Stay Meetings, dates discussed:    Additional Comments:  Carles Collet, RN 09/01/2015, 2:03 PM

## 2015-09-01 NOTE — Discharge Summary (Signed)
Physician Discharge Summary  Jason [name] JAS:[id number] DOB: [birth year] DOA: 08/30/2015  PCP: Verline Lema, MD  Admit date: 08/30/2015 Discharge date: 09/01/2015  Time spent: > 30 minutes  Recommendations for Outpatient Follow-up:  1. Follow up with Dr. Henrene Pastor in 2-3 weeks 2. Follow up with Dr. Jaynee Eagles in 1 month 3. Started on Aspirin and Plavix  Discharge Diagnoses:  Active Problems:   Depression   AKI (acute kidney injury) ([place])   CVA (cerebral infarction)   HLD (hyperlipidemia)  Discharge Condition: stable  Diet recommendation: heart healthy  Filed Weights   09/01/15 0100  Weight: 79.833 kg (176 lb)    History of present illness:  See H&P, Labs, Consult and Test reports for all details in brief, patient is a 70 year old male with HTN, HLD, prior CVA, who presents to the ER after developing limb incoordination, confusion and blurry vision. Patient found to have an acute CVA, neurology has been consulted   Hospital Course:  CVA, right occipital - Hx of CVA in 2010. Patient was admitted to the hospital with an occipital stroke. Neurology was consulted to have followed patient will hospitalized. He underwent a 2-D echo which showed normal ejection fraction and grade 1 diastolic dysfunction. For his carotids he underwent a CT angiogram of the neck with several areas of atherosclerosis as outlined below under imaging. I discussed his care with the stroke neurologist Dr. Erlinda Hong,  patient will need to be on dual antiplatelet therapy with aspirin and Plavix for at least 2 months. He will need to see neurology prior to this continued dual antiplatelet therapy to determine final regimen for the patient. He worked with physical therapy and recommended no PT follow-up. Patient was feeling close to baseline, he still has a small left visual field deficit, he was advised for no driving or operating heavy machinery until seen and cleared by neurology and ophthalmology. Depression - stable,  continue home Prozac AKI (acute kidney injury) - improved with fluids Hypertension -  gradually normalize blood pressure within 5 days  Hyperlipidemia - continue home statin  Procedures:  2D echo  Study Conclusions - Left ventricle: The cavity size was normal. Systolic function wasnormal. The estimated ejection fraction was in the range of 55%to 60%. Wall motion was normal; there were no regional wallmotion abnormalities. Doppler parameters are consistent withabnormal left ventricular relaxation (grade 1 diastolicdysfunction). - Mitral valve: There was mild regurgitation.   Consultations:  Neurology   Discharge Exam: Filed Vitals:   08/31/15 2056 09/01/15 0027 09/01/15 0100 09/01/15 0438  BP: 168/76 193/83  167/71  Pulse: 77 82  76  Temp: 97.8 F (36.6 C) 97.8 F (36.6 C)  97.9 F (36.6 C)  TempSrc: Oral Oral  Oral  Resp: '18 18  18  '$ Height:   '5\' 4"'$  (1.626 m)   Weight:   79.833 kg (176 lb)   SpO2: 95% 94%  95%    General: NAD Cardiovascular: RRR Respiratory: CTA biL  Discharge Instructions Activity:  As tolerated   Get Medicines reviewed and adjusted: Please take all your medications with you for your next visit with your Primary MD  Please request your Primary MD to go over all hospital tests and procedure/radiological results at the follow up, please ask your Primary MD to get all Hospital records sent to his/her office.  If you experience worsening of your admission symptoms, develop shortness of breath, life threatening emergency, suicidal or homicidal thoughts you must seek medical attention immediately by calling 911 or calling  your MD immediately if symptoms less severe.  You must read complete instructions/literature along with all the possible adverse reactions/side effects for all the Medicines you take and that have been prescribed to you. Take any new Medicines after you have completely understood and accpet all the possible adverse reactions/side  effects.   Do not drive when taking Pain medications.   Do not take more than prescribed Pain, Sleep and Anxiety Medications  Special Instructions: If you have smoked or chewed Tobacco in the last 2 yrs please stop smoking, stop any regular Alcohol and or any Recreational drug use.  Wear Seat belts while driving.  Please note  You were cared for by a hospitalist during your hospital stay. Once you are discharged, your primary care physician will handle any further medical issues. Please note that NO REFILLS for any discharge medications will be authorized once you are discharged, as it is imperative that you return to your primary care physician (or establish a relationship with a primary care physician if you do not have one) for your aftercare needs so that they can reassess your need for medications and monitor your lab values. Discharge Instructions    Ambulatory referral to Neurology    Complete by:  As directed   An appointment is requested in approximately: 8 weeks. He has seen her in the past - seen by Dr. Erlinda Hong in the hospital as well.            Medication List    TAKE these medications        aspirin EC 81 MG tablet  Take 1 tablet (81 mg total) by mouth daily.     atorvastatin 40 MG tablet  Commonly known as:  LIPITOR  Take 1 tablet (40 mg total) by mouth daily at 6 PM.     carboxymethylcellulose 1 % ophthalmic solution  Apply 1 drop to eye 3 (three) times daily.     clopidogrel 75 MG tablet  Commonly known as:  PLAVIX  Take 1 tablet (75 mg total) by mouth daily.     Fish Oil 1000 MG Caps  Take 2,000 mg by mouth daily.     FLUoxetine 20 MG capsule  Commonly known as:  PROZAC  Take 60 mg by mouth daily.     gabapentin 300 MG capsule  Commonly known as:  NEURONTIN  Take 300 mg by mouth 2 (two) times daily.     lisinopril 20 MG tablet  Commonly known as:  PRINIVIL,ZESTRIL  Take 40 mg by mouth daily.     methocarbamol 750 MG tablet  Commonly known as:   ROBAXIN  Take 750 mg by mouth at bedtime.     mirtazapine 45 MG tablet  Commonly known as:  REMERON  Take 45 mg by mouth at bedtime.     omeprazole 20 MG capsule  Commonly known as:  PRILOSEC  Take 20 mg by mouth 2 (two) times daily before a meal.     oxyCODONE-acetaminophen 5-325 MG tablet  Commonly known as:  PERCOCET/ROXICET  Take 1 tablet by mouth every 4 (four) hours as needed for moderate pain or severe pain.     prazosin 1 MG capsule  Commonly known as:  MINIPRESS  Take 3 mg by mouth at bedtime.           Follow-up Information    Follow up with Melvenia Beam, MD In 2 months.   Specialty:  Neurology   Why:  Stroke Clinic, Office will call  you with appointment date & time   Contact information:   [address] [place] [address] [phone]       The results of significant diagnostics from this hospitalization (including imaging, microbiology, ancillary and laboratory) are listed below for reference.    Significant Diagnostic Studies: Dg Chest 2 View  08/30/2015  CLINICAL DATA:  CVA EXAM: CHEST  2 VIEW COMPARISON:  05/09/2014 FINDINGS: The heart size and mediastinal contours are within normal limits. Both lungs are clear. The visualized skeletal structures are unremarkable. IMPRESSION: No active cardiopulmonary disease. Electronically Signed   By: Rolm Baptise M.D.   On: 08/30/2015 17:17   Ct Angio Neck W/cm &/or Wo/cm  09/01/2015  CLINICAL DATA:  Stroke.  History of hypertension. EXAM: CT ANGIOGRAPHY NECK TECHNIQUE: Multidetector CT imaging of the neck was performed using the standard protocol during bolus administration of intravenous contrast. Multiplanar CT image reconstructions and MIPs were obtained to evaluate the vascular anatomy. Carotid stenosis measurements (when applicable) are obtained utilizing NASCET criteria, using the distal internal carotid diameter as the denominator. CONTRAST:  44m OMNIPAQUE IOHEXOL 350 MG/ML SOLN COMPARISON:  None.  FINDINGS: Aortic arch: No visualized aneurysm or dissection. Diffuse atheromatous wall thickening. Three vessel branching Right carotid system: Diffuse atheromatous wall thickening, predominantly noncalcified. Wall thickening is particularly notable at the bifurcation where there is plaque cavitation. No measurable stenosis. No evidence of superimposed dissection. ICA tortuosity. Left carotid system: Diffuse atheromatous wall thickening with heavy plaque deposition around the bifurcation. There is proximal ICA narrowing at a prominent shelf-like noncalcified plaque along the posterior wall, but no stenosis of 50% or greater. This stenosis is visually accentuated by positive remodeling the proximal ICA with smoothly contoured plaque excavation. Vertebral arteries:Dominant left vertebral artery with high-grade ostial stenosis. Diminutive right vertebral artery with diffuse undulating luminal appearance. The vessel is not visualized at the C1 level. This appearance is concerning for dissection, but V4 segment has a similar appearance to 2015 intracranial MRA and there is no intramural T1 hyperintense hematoma on conventional MRI performed 2 days ago. There is a V4 segment high-grade stenosis with the right PICA likely fed by the basilar. High-grade ostial stenosis. No flow limiting stenosis in the bilateral subclavian artery proximally. Notable distal brachiocephalic plaque with ulcerated appearance on coronal reformats. Skeleton: No contributory findings. Other neck: 9 mm subpleural right upper lobe nodule with ill-defined margins. The nodule has somewhat flat morphology on coronal reformats and could be scar or neoplasm. Follow-up is needed in this former smoker. Vascular findings were discussed by telephone at the time of interpretation on 09/01/2015 at 10:19 am to Dr. JRosalin Hawking, who verbally acknowledged these results. IMPRESSION: 1. Poor flow and irregularity of the non dominant right vertebral artery, favored  secondary to high-grade stenoses at the V1, V3, and V4 segments rather than dissection. 2. High-grade left vertebral artery origin stenosis. 3. Extensive cervical carotid atherosclerosis with irregular/ulcerated bilateral ICA and right brachiocephalic artery plaque. No flow limiting carotid stenosis in the neck. 4. 9 mm right upper lobe nodule. Follow-up chest CT at 3-661monthis recommended in this former smoker. Electronically Signed   By: JoMonte Fantasia.D.   On: 09/01/2015 10:21   Mr Brain Wo Contrast  08/30/2015  CLINICAL DATA:  Patient awoke with confusion.  Blurred vision. EXAM: MRI HEAD WITHOUT CONTRAST TECHNIQUE: Multiplanar, multiecho pulse sequences of the brain and surrounding structures were obtained without intravenous contrast. COMPARISON:  MR brain 05/02/2014. FINDINGS: Small foci of restricted diffusion affect  the RIGHT occipital cortex and subcortical white matter, consistent with acute infarction. No visible acute hemorrhage, mass lesion or extra-axial fluid. Global atrophy with hydrocephalus ex vacuo. Extensive white matter disease. No midline abnormality. Large remote RIGHT cerebellar infarct. Scattered deep white matter lacunar infarcts. No chronic hemorrhage. Flow voids are maintained. LEFT vertebral dominant. BILATERAL cataract extraction. No sinus or mastoid air fluid level. IMPRESSION: Small foci of acute infarction affecting the RIGHT occipital lobe. No associated hemorrhage. Chronic changes as described. Electronically Signed   By: Staci Righter M.D.   On: 08/30/2015 13:25   Mr Jodene Nam Head/brain Wo Cm  08/30/2015  CLINICAL DATA:  Patient awoke with confusion. Blurred vision. Acute RIGHT occipital infarction on MR earlier today. EXAM: MRA HEAD WITHOUT CONTRAST TECHNIQUE: Angiographic images of the Circle of Willis were obtained using MRA technique without intravenous contrast. COMPARISON:  MRI brain 08/30/2015.  MRI brain and MRA 05/02/2014. FINDINGS: Internal carotid arteries show no  stenosis or occlusion. Sessile 3 mm outpouching from the RIGHT ICA, inferior cavernous segment, representing atherosclerotic disease. Basilar artery widely patent with LEFT vertebral dominant. Severely diseased, but patent RIGHT vertebral does not contribute meaningfully to the basilar. There is no proximal stenosis of the anterior, middle, or posterior cerebral arteries. No cerebellar branch occlusion. Similar appearance to priors. IMPRESSION: No proximal intracranial stenosis or dissection is observed. RIGHT PCA widely patent. Electronically Signed   By: Staci Righter M.D.   On: 08/30/2015 16:43    Microbiology: No results found for this or any previous visit (from the past 240 hour(s)).   Labs: Basic Metabolic Panel:  Recent Labs Lab 08/30/15 0904 08/31/15 0525  NA 143 144  K 3.5 3.7  CL 104 108  CO2 25 27  GLUCOSE 142* 103*  BUN 17 15  CREATININE 1.63* 1.25*  CALCIUM 9.4 8.9  MG 2.5*  --    Liver Function Tests:  Recent Labs Lab 08/30/15 0904  AST 29  ALT 27  ALKPHOS 138*  BILITOT 0.4  PROT 6.6  ALBUMIN 3.7   CBC:  Recent Labs Lab 08/30/15 0904  WBC 7.4  NEUTROABS 5.8  HGB 12.4*  HCT 39.4  MCV 89.7  PLT 279    CBG:  Recent Labs Lab 08/30/15 1119  GLUCAP 102*       Signed:  GHERGHE, COSTIN  [facility] 09/01/2015, 5:45 PM

## 2015-09-01 NOTE — Progress Notes (Signed)
STROKE TEAM PROGRESS NOTE   HISTORY OF PRESENT ILLNESS  (at the time of admission) Jason [name] is a 70 y.o. male who presented for further evaluation of limb incoordination, confusion and blurred vision. Initially, the patient felt that this may have been due to having taken too much of his Vicodin, stating he felt cognitive effects not dissimilar to being "high". He also had a period of blacking out for which he has no memory but suspects that he may have abused one or more of his medications at that time. He felt that his limbs were moving uncontrollably - when he tried to grab something "my hand went all over the place". This symptom was present in all four extremities. He now feels nearly back to baseline. Complains of a mild headache. He was nauseated at one point but did not vomit. He denies fevers, chills, abdominal pain, chest pain or limb pain. He has a PMHx of stroke, HTN, bipolar 1 disorder and depression.    SUBJECTIVE (INTERVAL HISTORY) His wife is at the bedside. Pt reported that he can not remember well what happened and he seems blacked out at that time. Wife started that he was clammy, sweating, and BP as low as 40s SBP. She called EMS. She stated that pt BP at home sometimes high and sometimes low. I recommended them to check at home twice a day and record.    OBJECTIVE Temp:  [97.8 F (36.6 C)-97.9 F (36.6 C)] 97.9 F (36.6 C) (02/27 0438) Pulse Rate:  [76-82] 76 (02/27 0438) Cardiac Rhythm:  [-] Heart block (02/27 0738) Resp:  [18-19] 18 (02/27 0438) BP: (167-193)/(71-83) 167/71 mmHg (02/27 0438) SpO2:  [94 %-97 %] 95 % (02/27 0438) Weight:  [79.833 kg (176 lb)] 79.833 kg (176 lb) (02/27 0100)  CBC:   Recent Labs Lab 08/30/15 0904  WBC 7.4  NEUTROABS 5.8  HGB 12.4*  HCT 39.4  MCV 89.7  PLT 233    Basic Metabolic Panel:   Recent Labs Lab 08/30/15 0904 08/31/15 0525  NA 143 144  K 3.5 3.7  CL 104 108  CO2 25 27  GLUCOSE 142* 103*  BUN 17 15   CREATININE 1.63* 1.25*  CALCIUM 9.4 8.9  MG 2.5*  --    Lipid Panel:     Component Value Date/Time   CHOL 206* 08/31/2015 0525   TRIG 236* 08/31/2015 0525   HDL 20* 08/31/2015 0525   CHOLHDL 10.3 08/31/2015 0525   VLDL 47* 08/31/2015 0525   LDLCALC 139* 08/31/2015 0525   HgbA1c:  Lab Results  Component Value Date   HGBA1C 5.1 08/31/2015   Urine Drug Screen:     Component Value Date/Time   LABOPIA NONE DETECTED 09/01/2015 0122   COCAINSCRNUR NONE DETECTED 09/01/2015 0122   LABBENZ NONE DETECTED 09/01/2015 0122   AMPHETMU NONE DETECTED 09/01/2015 0122   THCU NONE DETECTED 09/01/2015 0122   LABBARB NONE DETECTED 09/01/2015 0122      IMAGING I have personally reviewed the radiological images below and agree with the radiology interpretations.  Dg Chest 2 View 08/30/2015   No active cardiopulmonary disease.   Mr Brain Wo Contrast 08/30/2015   Small foci of acute infarction affecting the RIGHT occipital lobe. No associated hemorrhage. Chronic changes as described.   Mr Jodene Nam Head/brain Wo Cm 08/30/2015   No proximal intracranial stenosis or dissection is observed. RIGHT PCA widely patent.   Ct Angio Neck W/cm &/or Wo/cm 09/01/2015   1. Poor flow and irregularity  of the non dominant right vertebral artery, favored secondary to high-grade stenoses at the V1, V3, and V4 segments rather than dissection. 2. High-grade left vertebral artery origin stenosis. 3. Extensive cervical carotid atherosclerosis with irregular/ulcerated bilateral ICA and right brachiocephalic artery plaque. No flow limiting carotid stenosis in the neck. 4. 9 mm right upper lobe nodule. Follow-up chest CT at 3-56month is recommended in this former smoker.   Carotid Doppler   The right internal carotid artery exhibits low range 40-59% stenosis. The right vertebral artery exhibits an atypical bidirectional waveform. The right subclavian artery appears dampened when compared to the left subclavian artery.  The  left internal carotid artery exhibits low range 80-99% stenosis. The left vertebral artery is patent with antegrade flow.  2D Echocardiogram  - Left ventricle: The cavity size was normal. Systolic function wasnormal. The estimated ejection fraction was in the range of 55%to 60%. Wall motion was normal; there were no regional wallmotion abnormalities. Doppler parameters are consistent withabnormal left ventricular relaxation (grade 1 diastolicdysfunction). - Mitral valve: There was mild regurgitation..   LE venous doppler - Bilateral: No evidence of DVT, superficial thrombosis, or Baker's Cyst.   PHYSICAL EXAM HEENT- Normocephalic, no lesions, without obvious abnormality. Normal external eye and conjunctiva. Neck supple Card:  RRR, no M/R/G  Chest:  CTA Abd:  benign Extremities - Warm and well-perfused.   Neurological Examination Mental Status: Alert, oriented, thought content appropriate. Mildly anxious affect. Speech fluent without evidence of aphasia. Able to follow all commands without difficulty. Cranial Nerves: II - Visual field testing reveals subtle aspects of left visual field loss (not a full homonymous hemianopsia), pupils equal, round, reactive to light and accommodation. Visible sequelae of prior cataract surgeries noted.  III,IV, VI: ptosis not present, extra-ocular motions intact bilaterally without nystagmus.  V,VII: smile symmetric, facial light touch sensation normal bilaterally VIII: hearing intact to conversation. IX,X: No dysphonia XI: bilateral shoulder shrug equal XII: midline tongue extension  Motor: Right :Upper extremity 5/5Left: Upper extremity 5/5 Lower extremity 5/5Lower extremity 5/5  Tone and bulk:normal tone throughout; no atrophy noted  Sensory: Light touch intact throughout, with no asymmetry noted. Cerebellar: No ataxia on  finger-to-nose Gait: deferred   ASSESSMENT/PLAN Mr. Jason MOULTRIEis a 70y.o. male with history of hypertension, previous stroke, and bipolar disorder, presenting with nausea, limb incoordination, confusion and blurred vision. He did not receive IV t-PA due to resolution of deficits.  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  Resultant  Improving visual field deficits.  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  Carotid Doppler R ICA 40-59% and L ICA 80-99%  2D Echo  No source of embolus   CTA neck poor flow R VA, high grade L VA stenosis, extensive cervical carotid atherosclerosis (not flow limiting), [id number] nodule  LE venous doppler negative for DVT  LDL - 139  HgbA1c 5.1  VTE prophylaxis - Lovenox Diet Heart Room service appropriate?: Yes; Fluid consistency:: Thin  aspirin 325 mg daily prior to admission, now on aspirin 325 mg daily - Given large vessel intracranial atherosclerosis, patient should be treated with aspirin 325 mg and clopidogrel 75 mg orally every day x 3 months for secondary stroke prevention. After 3 months, change to plavix alone. Long-term dual antiplatelets are contraindicated due to risk for intracerebral hemorrhage.  Patient counseled to be compliant with his antithrombotic medications  Ongoing aggressive  stroke risk factor management  Recommend OP 30-d monitoring to look for atrial fibrillation (has had 2 week monitoring in past, recommend repeat monitoring)  Consider OP sleep study  Therapy recommendations: No therapy needs   Disposition: return home  Followed by Dr. Jaynee Eagles with [facility]- follow up with her in 2 months.  Hypertension  Blood pressure somewhat high (the patient was on lisinopril and Minipress prior to admission)  Avoid hypotension  Recommended check BP at home twice daily and record  BP goal 130-150 due to b/l  carotid stenosis  Hyperlipidemia  Home meds: Lipitor 40 mg daily resumed.  LDL 139, goal < 70  Increased Lipitor to 80 mg daily.  Continue statin at discharge  Other Stroke Risk Factors  Advanced age  Cigarette smoker, quit smoking 12 years ago.  Obesity, Body mass index is 30.2 kg/(m^2).   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 9 mm right upper lobe nodule. Follow-up chest CT at 3-67month is recommended in this former smoker.   Hospital day # 2  Neurology will sign off. Please call with questions. [facility]. Thanks for the consult.  JRosalin Hawking MD PhD Stroke Neurology 09/01/2015 11:01 PM

## 2015-09-01 NOTE — Evaluation (Signed)
Occupational Therapy Evaluation Patient Details Name: Jason [name] MRN: [id number] DOB: [birth year] Today's Date: 09/01/2015    History of Present Illness Pt is a married 70 yo male who had sudden difficulty with walking, hand movement and vision change at unknown exact time after he got up early in am.  He has chronic ongoing LBP with occasional Rt radicular pain into mid part of leg. 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 All education has been completed and the patient has no further questions. Pt demonstrates small Lt visual field deficit.  Extensive education completed with pt and wife re: safety and need for close supervision with community activities.  Recommend pt does not drive until cleared by MD, and recommend that he undergo Humphries visual field test.  Pt is compensating very well, and field deficit appears small per confrontation testing.  Do not feel he needs follow up OT unless he struggles with community activities, and in that case instructed pt and wife to discuss need for referral to OPOT at follow up if necessary at that time.  See below for any follow-up Occupational Therapy or equipment needs. OT is signing off. Thank you for this referral.      Follow Up Recommendations  Supervision - Intermittent;No OT follow up    Equipment Recommendations  None recommended by OT    Recommendations for Other Services       Precautions / Restrictions Precautions Precautions: None Precaution Comments: Pt with Lt visual field deficit Restrictions Weight Bearing Restrictions: No      Mobility Bed Mobility Overal bed mobility: Independent                Transfers Overall transfer level:  Independent Equipment used: Straight cane                  Balance Overall balance assessment: Modified Independent                                          ADL Overall ADL's : Modified independent                                             Vision Vision Assessment?: Yes Eye Alignment: Within Functional Limits Ocular Range of Motion: Within Functional Limits Alignment/Gaze Preference: Within Defined Limits Tracking/Visual Pursuits: Able to track stimulus in all quads without difficulty Visual Fields: Left visual field deficit Additional Comments: Pt with Lt visual field deficit.  Pt is very aware of the location and has begun head turns. and repositioning of eyes to see objects within the area of deficit.  He is able to read a page of info mod I, but does have to shift gaze to see the first letter on the left.  He is able to locate items in his room mod I, and was able to negotiate busy environment without running into objects on his left.   Discussed recommendation for no driving (until cleared by MD), need for full visual field assessment to be performed by ophthalmologist.  Recommended to  pt  and wife that he play video games to improve his speed of scanning, that he work on negotiating busy community environments with wife providing direct and close supervision, and that wife be with pt anytime he attempts to cross a street or negotiate a parking lot.  They verbalized understanding and agreement with all recommendations    Perception Perception Perception Tested?: Yes   Praxis Praxis Praxis tested?: Within functional limits    Pertinent Vitals/Pain Pain Assessment: 0-10 Pain Score: 4  Pain Location: back (chronic) Pain Descriptors / Indicators: Aching Pain Intervention(s): Monitored during session     Hand Dominance Right   Extremity/Trunk Assessment Upper Extremity Assessment Upper Extremity Assessment: Overall WFL for tasks  assessed   Lower Extremity Assessment Lower Extremity Assessment: Defer to PT evaluation   Cervical / Trunk Assessment Cervical / Trunk Assessment: Normal   Communication Communication Communication: No difficulties   Cognition Arousal/Alertness: Awake/alert Behavior During Therapy: WFL for tasks assessed/performed Overall Cognitive Status: Within Functional Limits for tasks assessed                     General Comments       Exercises       Shoulder Instructions      Home Living Family/patient expects to be discharged to:: Private residence Living Arrangements: Spouse/significant other;Children Available Help at Discharge: Family;Available 24 hours/day Type of Home: House Home Access: Stairs to enter [employer] of Steps: 1 Entrance Stairs-Rails: None Home Layout: Two level Alternate Level Stairs-Number of Steps: 15   Bathroom Shower/Tub: Teacher, early years/pre: Standard Bathroom Accessibility: Yes   Home Equipment: Environmental consultant - 2 wheels;Cane - single point;Tub bench   Additional Comments: has not used RW in quite some time      Prior Functioning/Environment Level of Independence: Independent with assistive device(s)        Comments: cane for household and community mobility    OT Diagnosis: Disturbance of vision   OT Problem List:     OT Treatment/Interventions:      OT Goals(Current goals can be found in the care plan section) Acute Rehab OT Goals OT Goal Formulation: All assessment and education complete, DC therapy  OT Frequency:     Barriers to D/C:            Co-evaluation              End of Session Equipment Utilized During Treatment: Other (comment) [facility]) Nurse Communication: Mobility status  Activity Tolerance: Patient tolerated treatment well Patient left: in bed;with call bell/phone within reach;with family/visitor present   Time: 1106-1208 OT Time Calculation (min): 62 min Charges:  OT General  Charges $OT Visit: 1 Procedure OT Evaluation $OT Eval Moderate Complexity: 1 Procedure OT Treatments $Self Care/Home Management : 38-52 mins G-Codes:    Aliza Moret M October 01, 2015, 12:17 PM

## 2015-09-01 NOTE — Discharge Instructions (Signed)
Follow with PERRY, ANGELA L, MD in 5-7 days  Take Aspirin and Plavix, discuss with neurology in 1 month again this regimen  Please get a complete blood count and chemistry panel checked by your Primary MD at your next visit, and again as instructed by your Primary MD. Please get your medications reviewed and adjusted by your Primary MD.  Please request your Primary MD to go over all Hospital Tests and Procedure/Radiological results at the follow up, please get all Hospital records sent to your Prim MD by signing hospital release before you go home.  If you had Pneumonia of Lung problems at the Hospital: Please get a 2 view Chest X ray done in 6-8 weeks after hospital discharge or sooner if instructed by your Primary MD.  If you have Congestive Heart Failure: Please call your Cardiologist or Primary MD anytime you have any of the following symptoms:  1) 3 pound weight gain in 24 hours or 5 pounds in 1 week  2) shortness of breath, with or without a dry hacking cough  3) swelling in the hands, feet or stomach  4) if you have to sleep on extra pillows at night in order to breathe  Follow cardiac low salt diet and 1.5 lit/day fluid restriction.  If you have diabetes Accuchecks 4 times/day, Once in AM empty stomach and then before each meal. Log in all results and show them to your primary doctor at your next visit. If any glucose reading is under 80 or above 300 call your primary MD immediately.  If you have Seizure/Convulsions/Epilepsy: Please do not drive, operate heavy machinery, participate in activities at heights or participate in high speed sports until you have seen by Primary MD or a Neurologist and advised to do so again.  If you had Gastrointestinal Bleeding: Please ask your Primary MD to check a complete blood count within one week of discharge or at your next visit. Your endoscopic/colonoscopic biopsies that are pending at the time of discharge, will also need to followed by  your Primary MD.  Get Medicines reviewed and adjusted. Please take all your medications with you for your next visit with your Primary MD  Please request your Primary MD to go over all hospital tests and procedure/radiological results at the follow up, please ask your Primary MD to get all Hospital records sent to his/her office.  If you experience worsening of your admission symptoms, develop shortness of breath, life threatening emergency, suicidal or homicidal thoughts you must seek medical attention immediately by calling 911 or calling your MD immediately  if symptoms less severe.  You must read complete instructions/literature along with all the possible adverse reactions/side effects for all the Medicines you take and that have been prescribed to you. Take any new Medicines after you have completely understood and accpet all the possible adverse reactions/side effects.   Do not drive or operate heavy machinery when taking Pain medications.   Do not take more than prescribed Pain, Sleep and Anxiety Medications  Special Instructions: If you have smoked or chewed Tobacco  in the last 2 yrs please stop smoking, stop any regular Alcohol  and or any Recreational drug use.  Wear Seat belts while driving.  Please note You were cared for by a hospitalist during your hospital stay. If you have any questions about your discharge medications or the care you received while you were in the hospital after you are discharged, you can call the unit and asked to speak with the  hospitalist on call if the hospitalist that took care of you is not available. Once you are discharged, your primary care physician will handle any further medical issues. Please note that NO REFILLS for any discharge medications will be authorized once you are discharged, as it is imperative that you return to your primary care physician (or establish a relationship with a primary care physician if you do not have one) for your  aftercare needs so that they can reassess your need for medications and monitor your lab values.  You can reach the hospitalist office at phone [phone] or fax [phone]   If you do not have a primary care physician, you can call [phone] for a physician referral.  Activity: As tolerated with Full fall precautions use walker/cane & assistance as needed  Diet: heart healthy  Disposition Home

## 2015-09-01 NOTE — Progress Notes (Signed)
VASCULAR LAB PRELIMINARY  PRELIMINARY  PRELIMINARY  PRELIMINARY  Bilateral lower extremity venous duplex  completed.    Preliminary report:  Bilateral:  No evidence of DVT, superficial thrombosis, or Baker's Cyst.    Aubrionna Istre, RVT 09/01/2015, 3:28 PM

## 2015-09-01 NOTE — Progress Notes (Signed)
Jason [name] to be D/C'd to home per MD order.  Discussed with the patient and all questions fully answered.  VSS, Skin clean, dry and intact without evidence of skin break down, no evidence of skin tears noted. IV catheter discontinued intact. Site without signs and symptoms of complications. Dressing and pressure applied.  An After Visit Summary was printed and given to the patient. Patient received prescriptions.  D/c education completed with patient/family including follow up instructions, medication list, d/c activities limitations if indicated, with other d/c instructions as indicated by MD - patient able to verbalize understanding, all questions fully answered.   Patient instructed to return to ED, call 911, or call MD for any changes in condition.   Patient escorted via [place], and D/C home via private auto.  [name] 09/01/2015 4:45 PM

## 2015-09-02 ENCOUNTER — Other Ambulatory Visit: Payer: Self-pay | Admitting: Nurse Practitioner

## 2015-09-02 DIAGNOSIS — I639 Cerebral infarction, unspecified: Secondary | ICD-10-CM

## 2015-09-25 ENCOUNTER — Emergency Department (HOSPITAL_COMMUNITY)
Admission: EM | Admit: 2015-09-25 | Discharge: 2015-09-25 | Disposition: A | Discharge status: Home / Self Care | Payer: Medicare Other | Attending: Emergency Medicine | Admitting: Emergency Medicine

## 2015-09-25 ENCOUNTER — Encounter (HOSPITAL_COMMUNITY): Payer: Self-pay | Admitting: *Deleted

## 2015-09-25 DIAGNOSIS — R197 Diarrhea, unspecified: Secondary | ICD-10-CM | POA: Diagnosis not present

## 2015-09-25 DIAGNOSIS — I10 Essential (primary) hypertension: Secondary | ICD-10-CM | POA: Insufficient documentation

## 2015-09-25 DIAGNOSIS — R509 Fever, unspecified: Secondary | ICD-10-CM | POA: Diagnosis not present

## 2015-09-25 DIAGNOSIS — R112 Nausea with vomiting, unspecified: Secondary | ICD-10-CM | POA: Diagnosis not present

## 2015-09-25 DIAGNOSIS — Z87891 Personal history of nicotine dependence: Secondary | ICD-10-CM | POA: Insufficient documentation

## 2015-09-25 LAB — CBC
HCT: 37.1 % — ABNORMAL LOW (ref 39.0–52.0)
HEMOGLOBIN: 12 g/dL — AB (ref 13.0–17.0)
MCH: 27.7 pg (ref 26.0–34.0)
MCHC: 32.3 g/dL (ref 30.0–36.0)
MCV: 85.7 fL (ref 78.0–100.0)
PLATELETS: 461 10*3/uL — AB (ref 150–400)
RBC: 4.33 MIL/uL (ref 4.22–5.81)
RDW: 14 % (ref 11.5–15.5)
WBC: 11.2 10*3/uL — AB (ref 4.0–10.5)

## 2015-09-25 LAB — COMPREHENSIVE METABOLIC PANEL
ALK PHOS: 155 U/L — AB (ref 38–126)
ALT: 18 U/L (ref 17–63)
ANION GAP: 12 (ref 5–15)
AST: 18 U/L (ref 15–41)
Albumin: 3.9 g/dL (ref 3.5–5.0)
BUN: 28 mg/dL — ABNORMAL HIGH (ref 6–20)
CALCIUM: 10.1 mg/dL (ref 8.9–10.3)
CO2: 17 mmol/L — AB (ref 22–32)
CREATININE: 1.68 mg/dL — AB (ref 0.61–1.24)
Chloride: 109 mmol/L (ref 101–111)
GFR, EST AFRICAN AMERICAN: 47 mL/min — AB (ref >60)
GFR, EST NON AFRICAN AMERICAN: 40 mL/min — AB (ref >60)
Glucose, Bld: 98 mg/dL (ref 65–99)
Potassium: 4.6 mmol/L (ref 3.5–5.1)
SODIUM: 138 mmol/L (ref 135–145)
TOTAL PROTEIN: 7.5 g/dL (ref 6.5–8.1)
Total Bilirubin: 0.4 mg/dL (ref 0.3–1.2)

## 2015-09-25 LAB — LIPASE, BLOOD: LIPASE: 45 U/L (ref 11–51)

## 2015-09-25 NOTE — ED Notes (Signed)
Pt.s wife stated that were gotten leave pt.wanted to lay down in a bed .pt. Was waiting a room in the bed in the back This has been fully explained to  Family and pt.about the wait time.

## 2015-09-25 NOTE — ED Notes (Signed)
Pt reports N/V/D, chills, fever for 2 days. reports taking ibuprofen at 8am today. NAD noted in triage.

## 2015-10-14 ENCOUNTER — Encounter (HOSPITAL_COMMUNITY): Payer: Self-pay

## 2015-10-14 ENCOUNTER — Observation Stay (HOSPITAL_COMMUNITY)
Admission: EM | Admit: 2015-10-14 | Discharge: 2015-10-17 | Disposition: A | Discharge status: Home / Self Care | Payer: Medicare Other | Attending: Internal Medicine | Admitting: Internal Medicine

## 2015-10-14 ENCOUNTER — Emergency Department (HOSPITAL_COMMUNITY): Payer: Medicare Other

## 2015-10-14 DIAGNOSIS — F319 Bipolar disorder, unspecified: Secondary | ICD-10-CM | POA: Diagnosis present

## 2015-10-14 DIAGNOSIS — E785 Hyperlipidemia, unspecified: Secondary | ICD-10-CM | POA: Diagnosis present

## 2015-10-14 DIAGNOSIS — E1121 Type 2 diabetes mellitus with diabetic nephropathy: Secondary | ICD-10-CM | POA: Insufficient documentation

## 2015-10-14 DIAGNOSIS — K5730 Diverticulosis of large intestine without perforation or abscess without bleeding: Secondary | ICD-10-CM | POA: Diagnosis not present

## 2015-10-14 DIAGNOSIS — D123 Benign neoplasm of transverse colon: Secondary | ICD-10-CM | POA: Insufficient documentation

## 2015-10-14 DIAGNOSIS — Z87891 Personal history of nicotine dependence: Secondary | ICD-10-CM | POA: Diagnosis not present

## 2015-10-14 DIAGNOSIS — I10 Essential (primary) hypertension: Secondary | ICD-10-CM | POA: Diagnosis present

## 2015-10-14 DIAGNOSIS — D500 Iron deficiency anemia secondary to blood loss (chronic): Secondary | ICD-10-CM | POA: Diagnosis not present

## 2015-10-14 DIAGNOSIS — Z7902 Long term (current) use of antithrombotics/antiplatelets: Secondary | ICD-10-CM | POA: Insufficient documentation

## 2015-10-14 DIAGNOSIS — D649 Anemia, unspecified: Secondary | ICD-10-CM | POA: Diagnosis not present

## 2015-10-14 DIAGNOSIS — D125 Benign neoplasm of sigmoid colon: Secondary | ICD-10-CM | POA: Insufficient documentation

## 2015-10-14 DIAGNOSIS — K219 Gastro-esophageal reflux disease without esophagitis: Secondary | ICD-10-CM | POA: Diagnosis present

## 2015-10-14 DIAGNOSIS — E1122 Type 2 diabetes mellitus with diabetic chronic kidney disease: Secondary | ICD-10-CM | POA: Diagnosis not present

## 2015-10-14 DIAGNOSIS — I129 Hypertensive chronic kidney disease with stage 1 through stage 4 chronic kidney disease, or unspecified chronic kidney disease: Secondary | ICD-10-CM | POA: Diagnosis not present

## 2015-10-14 DIAGNOSIS — N179 Acute kidney failure, unspecified: Secondary | ICD-10-CM | POA: Diagnosis not present

## 2015-10-14 DIAGNOSIS — K921 Melena: Secondary | ICD-10-CM | POA: Insufficient documentation

## 2015-10-14 DIAGNOSIS — R739 Hyperglycemia, unspecified: Secondary | ICD-10-CM | POA: Diagnosis not present

## 2015-10-14 DIAGNOSIS — N183 Chronic kidney disease, stage 3 (moderate): Secondary | ICD-10-CM | POA: Diagnosis not present

## 2015-10-14 DIAGNOSIS — Z7982 Long term (current) use of aspirin: Secondary | ICD-10-CM | POA: Insufficient documentation

## 2015-10-14 DIAGNOSIS — Z8546 Personal history of malignant neoplasm of prostate: Secondary | ICD-10-CM | POA: Diagnosis not present

## 2015-10-14 DIAGNOSIS — K922 Gastrointestinal hemorrhage, unspecified: Secondary | ICD-10-CM | POA: Insufficient documentation

## 2015-10-14 DIAGNOSIS — E782 Mixed hyperlipidemia: Secondary | ICD-10-CM | POA: Diagnosis present

## 2015-10-14 DIAGNOSIS — Z79899 Other long term (current) drug therapy: Secondary | ICD-10-CM | POA: Insufficient documentation

## 2015-10-14 DIAGNOSIS — I959 Hypotension, unspecified: Secondary | ICD-10-CM | POA: Diagnosis not present

## 2015-10-14 DIAGNOSIS — Z8673 Personal history of transient ischemic attack (TIA), and cerebral infarction without residual deficits: Secondary | ICD-10-CM | POA: Insufficient documentation

## 2015-10-14 DIAGNOSIS — D62 Acute posthemorrhagic anemia: Secondary | ICD-10-CM | POA: Insufficient documentation

## 2015-10-14 DIAGNOSIS — I6529 Occlusion and stenosis of unspecified carotid artery: Secondary | ICD-10-CM | POA: Diagnosis present

## 2015-10-14 HISTORY — DX: Anemia, unspecified: D64.9

## 2015-10-14 HISTORY — DX: Acute kidney failure, unspecified: N17.9

## 2015-10-14 LAB — COMPREHENSIVE METABOLIC PANEL
ALBUMIN: 3.3 g/dL — AB (ref 3.5–5.0)
ALK PHOS: 103 U/L (ref 38–126)
ALT: 15 U/L — ABNORMAL LOW (ref 17–63)
ANION GAP: 12 (ref 5–15)
AST: 17 U/L (ref 15–41)
BILIRUBIN TOTAL: 0.5 mg/dL (ref 0.3–1.2)
BUN: 36 mg/dL — AB (ref 6–20)
CALCIUM: 8.7 mg/dL — AB (ref 8.9–10.3)
CO2: 21 mmol/L — AB (ref 22–32)
Chloride: 109 mmol/L (ref 101–111)
Creatinine, Ser: 3.02 mg/dL — ABNORMAL HIGH (ref 0.61–1.24)
GFR calc Af Amer: 23 mL/min — ABNORMAL LOW (ref >60)
GFR calc non Af Amer: 20 mL/min — ABNORMAL LOW (ref >60)
GLUCOSE: 164 mg/dL — AB (ref 65–99)
POTASSIUM: 4.4 mmol/L (ref 3.5–5.1)
SODIUM: 142 mmol/L (ref 135–145)
TOTAL PROTEIN: 5.9 g/dL — AB (ref 6.5–8.1)

## 2015-10-14 LAB — I-STAT CHEM 8, ED
BUN: 35 mg/dL — ABNORMAL HIGH (ref 6–20)
CREATININE: 2.8 mg/dL — AB (ref 0.61–1.24)
Calcium, Ion: 1.17 mmol/L (ref 1.13–1.30)
Chloride: 107 mmol/L (ref 101–111)
GLUCOSE: 154 mg/dL — AB (ref 65–99)
HEMATOCRIT: 24 % — AB (ref 39.0–52.0)
HEMOGLOBIN: 8.2 g/dL — AB (ref 13.0–17.0)
Potassium: 4.4 mmol/L (ref 3.5–5.1)
Sodium: 145 mmol/L (ref 135–145)
TCO2: 21 mmol/L (ref 0–100)

## 2015-10-14 LAB — POC OCCULT BLOOD, ED: Fecal Occult Bld: POSITIVE — AB

## 2015-10-14 LAB — DIFFERENTIAL
BASOS ABS: 0 10*3/uL (ref 0.0–0.1)
Basophils Relative: 0 %
EOS PCT: 1 %
Eosinophils Absolute: 0.1 10*3/uL (ref 0.0–0.7)
LYMPHS ABS: 1.2 10*3/uL (ref 0.7–4.0)
LYMPHS PCT: 9 %
Monocytes Absolute: 0.5 10*3/uL (ref 0.1–1.0)
Monocytes Relative: 4 %
NEUTROS ABS: 10.8 10*3/uL — AB (ref 1.7–7.7)
NEUTROS PCT: 86 %

## 2015-10-14 LAB — CBC
HCT: 23.5 % — ABNORMAL LOW (ref 39.0–52.0)
HCT: 27.7 % — ABNORMAL LOW (ref 39.0–52.0)
HEMOGLOBIN: 7.3 g/dL — AB (ref 13.0–17.0)
Hemoglobin: 8.9 g/dL — ABNORMAL LOW (ref 13.0–17.0)
MCH: 28.2 pg (ref 26.0–34.0)
MCH: 28.6 pg (ref 26.0–34.0)
MCHC: 31.1 g/dL (ref 30.0–36.0)
MCHC: 32.1 g/dL (ref 30.0–36.0)
MCV: 90.7 fL (ref 78.0–100.0)
MCV: 89.1 fL (ref 78.0–100.0)
PLATELETS: 278 10*3/uL (ref 150–400)
Platelets: 233 10*3/uL (ref 150–400)
RBC: 2.59 MIL/uL — AB (ref 4.22–5.81)
RBC: 3.11 MIL/uL — ABNORMAL LOW (ref 4.22–5.81)
RDW: 16.5 % — ABNORMAL HIGH (ref 11.5–15.5)
RDW: 15.7 % — AB (ref 11.5–15.5)
WBC: 12.6 10*3/uL — AB (ref 4.0–10.5)
WBC: 9.7 10*3/uL (ref 4.0–10.5)

## 2015-10-14 LAB — RETICULOCYTES
RBC.: 2.6 MIL/uL — ABNORMAL LOW (ref 4.22–5.81)
RETIC CT PCT: 3.7 % — AB (ref 0.4–3.1)
Retic Count, Absolute: 96.2 10*3/uL (ref 19.0–186.0)

## 2015-10-14 LAB — IRON AND TIBC
Iron: 30 ug/dL — ABNORMAL LOW (ref 45–182)
Saturation Ratios: 8 % — ABNORMAL LOW (ref 17.9–39.5)
TIBC: 396 ug/dL (ref 250–450)
UIBC: 366 ug/dL

## 2015-10-14 LAB — URINALYSIS, ROUTINE W REFLEX MICROSCOPIC
BILIRUBIN URINE: NEGATIVE
GLUCOSE, UA: 100 mg/dL — AB
Hgb urine dipstick: NEGATIVE
KETONES UR: NEGATIVE mg/dL
Leukocytes, UA: NEGATIVE
Nitrite: NEGATIVE
PH: 5 (ref 5.0–8.0)
PROTEIN: NEGATIVE mg/dL
Specific Gravity, Urine: 1.022 (ref 1.005–1.030)

## 2015-10-14 LAB — RAPID URINE DRUG SCREEN, HOSP PERFORMED
AMPHETAMINES: NOT DETECTED
BARBITURATES: NOT DETECTED
Benzodiazepines: NOT DETECTED
COCAINE: NOT DETECTED
OPIATES: NOT DETECTED
TETRAHYDROCANNABINOL: NOT DETECTED

## 2015-10-14 LAB — FOLATE: Folate: 5.5 ng/mL — ABNORMAL LOW (ref >5.9)

## 2015-10-14 LAB — GLUCOSE, CAPILLARY
GLUCOSE-CAPILLARY: 85 mg/dL (ref 65–99)
Glucose-Capillary: 74 mg/dL (ref 65–99)

## 2015-10-14 LAB — PROTIME-INR
INR: 1.19 (ref 0.00–1.49)
PROTHROMBIN TIME: 15.3 s — AB (ref 11.6–15.2)

## 2015-10-14 LAB — I-STAT TROPONIN, ED: Troponin i, poc: 0 ng/mL (ref 0.00–0.08)

## 2015-10-14 LAB — CBG MONITORING, ED: GLUCOSE-CAPILLARY: 88 mg/dL (ref 65–99)

## 2015-10-14 LAB — I-STAT CG4 LACTIC ACID, ED: Lactic Acid, Venous: 1.3 mmol/L (ref 0.5–2.0)

## 2015-10-14 LAB — PREPARE RBC (CROSSMATCH)

## 2015-10-14 LAB — APTT: APTT: 26 s (ref 24–37)

## 2015-10-14 LAB — ABO/RH: ABO/RH(D): O POS

## 2015-10-14 LAB — VITAMIN B12: VITAMIN B 12: 266 pg/mL (ref 180–914)

## 2015-10-14 LAB — ETHANOL: Alcohol, Ethyl (B): <5 mg/dL (ref <5)

## 2015-10-14 LAB — FERRITIN: Ferritin: 15 ng/mL — ABNORMAL LOW (ref 24–336)

## 2015-10-14 MED ORDER — ONDANSETRON HCL 4 MG/2ML IJ SOLN: 4.0000 mg | Freq: Four times a day (QID) | INTRAMUSCULAR | Status: DC | PRN

## 2015-10-14 MED ORDER — SODIUM CHLORIDE 0.9 % IV SOLN
INTRAVENOUS | Status: AC
  Administered 2015-10-14: 12:00:00 via INTRAVENOUS

## 2015-10-14 MED ORDER — OXYCODONE-ACETAMINOPHEN 5-325 MG PO TABS
1.0000 | ORAL_TABLET | Freq: Once | ORAL | Status: AC
  Administered 2015-10-14: 1 via ORAL
  Filled 2015-10-14: qty 1

## 2015-10-14 MED ORDER — INSULIN ASPART 100 UNIT/ML ~~LOC~~ SOLN: 0.0000 [IU] | Freq: Every day | SUBCUTANEOUS | Status: DC

## 2015-10-14 MED ORDER — INSULIN ASPART 100 UNIT/ML ~~LOC~~ SOLN: 0.0000 [IU] | Freq: Three times a day (TID) | SUBCUTANEOUS | Status: DC

## 2015-10-14 MED ORDER — MIRTAZAPINE 30 MG PO TABS
45.0000 mg | ORAL_TABLET | Freq: Once | ORAL | Status: AC
  Administered 2015-10-14: 45 mg via ORAL
  Filled 2015-10-14: qty 1

## 2015-10-14 MED ORDER — ACETAMINOPHEN 650 MG RE SUPP: 650.0000 mg | Freq: Four times a day (QID) | RECTAL | Status: DC | PRN

## 2015-10-14 MED ORDER — ONDANSETRON HCL 4 MG PO TABS: 4.0000 mg | ORAL_TABLET | Freq: Four times a day (QID) | ORAL | Status: DC | PRN

## 2015-10-14 MED ORDER — SODIUM CHLORIDE 0.9 % IV BOLUS (SEPSIS)
1000.0000 mL | Freq: Once | INTRAVENOUS | Status: AC
  Administered 2015-10-14: 1000 mL via INTRAVENOUS

## 2015-10-14 MED ORDER — GABAPENTIN 300 MG PO CAPS
300.0000 mg | ORAL_CAPSULE | Freq: Three times a day (TID) | ORAL | Status: DC
  Administered 2015-10-14 – 2015-10-17 (×10): 300 mg via ORAL
  Filled 2015-10-14 (×10): qty 1

## 2015-10-14 MED ORDER — OXYCODONE-ACETAMINOPHEN 5-325 MG PO TABS
1.0000 | ORAL_TABLET | ORAL | Status: DC | PRN
  Administered 2015-10-14 – 2015-10-17 (×8): 1 via ORAL
  Filled 2015-10-14 (×8): qty 1

## 2015-10-14 MED ORDER — PANTOPRAZOLE SODIUM 40 MG IV SOLR
40.0000 mg | Freq: Two times a day (BID) | INTRAVENOUS | Status: DC
  Administered 2015-10-14: 40 mg via INTRAVENOUS
  Filled 2015-10-14: qty 40

## 2015-10-14 MED ORDER — SODIUM CHLORIDE 0.9 % IV SOLN
8.0000 mg/h | INTRAVENOUS | Status: DC
  Administered 2015-10-14 – 2015-10-15 (×3): 8 mg/h via INTRAVENOUS
  Filled 2015-10-14 (×8): qty 80

## 2015-10-14 MED ORDER — ACETAMINOPHEN 325 MG PO TABS: 650.0000 mg | ORAL_TABLET | Freq: Four times a day (QID) | ORAL | Status: DC | PRN

## 2015-10-14 MED ORDER — MORPHINE SULFATE (PF) 2 MG/ML IV SOLN: 1.0000 mg | INTRAVENOUS | Status: DC | PRN

## 2015-10-14 MED ORDER — SODIUM CHLORIDE 0.9 % IV SOLN
10.0000 mL/h | Freq: Once | INTRAVENOUS | Status: AC
  Administered 2015-10-14: 10 mL/h via INTRAVENOUS

## 2015-10-14 NOTE — ED Notes (Signed)
Attempted report x1. 

## 2015-10-14 NOTE — ED Notes (Signed)
Clear liquid lunch tray ordered @ 13:08p.

## 2015-10-14 NOTE — H&P (Signed)
[facility] History and Physical  ORRY SIGL HAL:[id number] DOB: [birth year] DOA: 10/14/2015  Referring physician: linker PCP: Verline Lema, MD   Chief Complaint: sob/dizziness  HPI: Jason [name] is a very pleasant 70 y.o. male with a past medical history that includes hypertension, stroke, bipolar disorder, acid reflux disease with ulcer, presents to the emergency department with the chief complaint of generalized weakness and shortness of breath with dizziness. Initial evaluation in the emergency department reveals a hemoglobin of 7.3 a creatinine of 3.02 with positive FOBT concerning for GI bleed  Information is obtained from the patient and the wife is at the bedside. He reports a gradual dyspnea on exertion as well as generalized weakness over the last couple of weeks. States he had a stroke about 6 weeks ago is placed on Plavix and also takes ibuprofen for general aches and pains. In addition 2 weeks ago he had "48 hours of fever chills diarrhea nausea and vomiting". This morning he awakened with a headache that was reminiscent of his strokes. He also complained bilateral leg pain. No recent falls. He denies chest pain palpitations visual disturbances abdominal pain nausea vomiting fever chills. He denies dysuria hematuria frequency or urgency. He denies any bright red blood per rectum or melena. He also takes his blood pressure fairly regularly at home since his strokes and reports it is "all over the place" he states systolic blood pressure ranges 90-190.  He does not remember ever having an endoscopy but remembers having a colonoscopy approximately 2 years ago at the [place]. He reports having polyps removed he has no recollection of any further results  In the emergency department he's afebrile hypotensive with a systolic blood pressure of 80 he is not hypoxic. He is provided with 2 L of normal saline and his blood pressure improved.   Review of Systems:  10 point review  of systems complete and all systems are negative except as indicated in the history of present illness  Past Medical History  Diagnosis Date  . Hypertension   . Stroke ([place])   . Prostate cancer ([place])   . Depression   . Bipolar 1 disorder ([place])   . Acid reflux disease with ulcer   . Anemia   . AKI (acute kidney injury) [facility])    Past Surgical History  Procedure Laterality Date  . Prostate surgery    . Back surgery     Social History:  reports that he quit smoking about 12 years ago. He has never used smokeless tobacco. He reports that he does not drink alcohol or use illicit drugs. He is married lives at home with his wife he is a retired Engineer, technical sales person he is independent with ADLs Allergies  Allergen Reactions  . Penicillins     Unknown reaction Has patient had a PCN reaction causing immediate rash, facial/tongue/throat swelling, SOB or lightheadedness with hypotension: Unknown Has patient had a PCN reaction causing severe rash involving mucus membranes or skin necrosis: Unknown Has patient had a PCN reaction that required hospitalization: No Has patient had a PCN reaction occurring within the last 10 years: No If all of the above answers are "NO", then may proceed with Cephalosporin use.     Family History  Problem Relation Age of Onset  . Stroke Mother     Hemorrhagic stroke  . Cancer Father     Pancreatic cancer  . Hypertension Father      Prior to Admission medications   Medication Sig Start Date  End Date Taking? Authorizing Provider  atorvastatin (LIPITOR) 40 MG tablet Take 1 tablet (40 mg total) by mouth daily at 6 PM. 04/14/14  Yes Maryann Mikhail, DO  clopidogrel (PLAVIX) 75 MG tablet Take 1 tablet (75 mg total) by mouth daily. 09/01/15  Yes Costin Karlyne Greenspan, MD  FLUoxetine (PROZAC) 20 MG capsule Take 60 mg by mouth daily.   Yes Historical Provider, MD  gabapentin (NEURONTIN) 300 MG capsule Take 300 mg by mouth 3 (three) times daily.    Yes Historical Provider, MD    lisinopril (PRINIVIL,ZESTRIL) 20 MG tablet Take 40 mg by mouth daily.   Yes Historical Provider, MD  mirtazapine (REMERON) 45 MG tablet Take 45 mg by mouth at bedtime.   Yes Historical Provider, MD  Omega-3 Fatty Acids (FISH OIL) 1000 MG CAPS Take 2,000 mg by mouth daily.   Yes Historical Provider, MD  omeprazole (PRILOSEC) 20 MG capsule Take 20 mg by mouth at bedtime.    Yes Historical Provider, MD  prazosin (MINIPRESS) 1 MG capsule Take 3 mg by mouth at bedtime.   Yes Historical Provider, MD  aspirin EC 81 MG tablet Take 1 tablet (81 mg total) by mouth daily. Patient not taking: Reported on 10/14/2015 09/01/15   Caren Griffins, MD  oxyCODONE-acetaminophen (PERCOCET/ROXICET) 5-325 MG tablet Take 1 tablet by mouth every 4 (four) hours as needed for moderate pain or severe pain. 09/01/15   Costin Karlyne Greenspan, MD   Physical Exam: Filed Vitals:   10/14/15 1043 10/14/15 1045 10/14/15 1100 10/14/15 1115  BP: 114/60 101/64 101/62 128/65  Pulse: 76 79 77 84  Temp: 97.7 F (36.5 C)     TempSrc: Oral     Resp: 18 13    SpO2:  100% 93% 99%    Wt Readings from Last 3 Encounters:  09/01/15 79.833 kg (176 lb)  06/11/14 86.002 kg (189 lb 9.6 oz)  05/15/14 82.555 kg (182 lb)    General:  Appears calm and comfortable Eyes: PERRL, normal lids, irises & conjunctiva ENT: grossly normal hearing, Dismembered is of his mouth are slightly pale somewhat dry Neck: no LAD, masses or thyromegaly Cardiovascular: RRR, no m/r/g. No LE edema.  Respiratory: CTA bilaterally, no w/r/r. Normal respiratory effort. Abdomen: soft, ntnd positive bowel sounds no guarding or rebounding Skin: no rash or induration seen on limited exam Musculoskeletal: grossly normal tone BUE/BLE joints without swelling/erythema Psychiatric: grossly normal mood and affect, speech fluent and appropriate Neurologic: grossly non-focal. Speech clear facial symmetry           Labs on Admission:  Basic Metabolic Panel:  Recent Labs Lab  10/14/15 0650 10/14/15 0722  NA 142 145  K 4.4 4.4  CL 109 107  CO2 21*  --   GLUCOSE 164* 154*  BUN 36* 35*  CREATININE 3.02* 2.80*  CALCIUM 8.7*  --    Liver Function Tests:  Recent Labs Lab 10/14/15 0650  AST 17  ALT 15*  ALKPHOS 103  BILITOT 0.5  PROT 5.9*  ALBUMIN 3.3*   No results for input(s): LIPASE, AMYLASE in the last 168 hours. No results for input(s): AMMONIA in the last 168 hours. CBC:  Recent Labs Lab 10/14/15 0650 10/14/15 0722  WBC 12.6*  --   NEUTROABS 10.8*  --   HGB 7.3* 8.2*  HCT 23.5* 24.0*  MCV 90.7  --   PLT 278  --    Cardiac Enzymes: No results for input(s): CKTOTAL, CKMB, CKMBINDEX, TROPONINI in the last 168 hours.  BNP (last 3 results) No results for input(s): BNP in the last 8760 hours.  ProBNP (last 3 results) No results for input(s): PROBNP in the last 8760 hours.  CBG: No results for input(s): GLUCAP in the last 168 hours.  Radiological Exams on Admission: Dg Chest 2 View  10/14/2015  CLINICAL DATA:  Weakness. EXAM: CHEST  2 VIEW COMPARISON:  August 30, 2015. FINDINGS: The heart size and mediastinal contours are within normal limits. Both lungs are clear. No pneumothorax or pleural effusion is noted. The visualized skeletal structures are unremarkable. IMPRESSION: No active cardiopulmonary disease. Electronically Signed   By: Marijo Conception, M.D.   On: 10/14/2015 09:26   Ct Head Wo Contrast  10/14/2015  CLINICAL DATA:  Acute headache, dizziness. EXAM: CT HEAD WITHOUT CONTRAST TECHNIQUE: Contiguous axial images were obtained from the base of the skull through the vertex without intravenous contrast. COMPARISON:  MRI of August 30, 2015.  CT scan of April 30, 2014. FINDINGS: Bony calvarium appears intact. Minimal diffuse cortical atrophy is noted. Mild chronic ischemic white matter disease is noted. Old right inferior cerebellar infarction is noted. No mass effect or midline shift is noted. Ventricular size is within normal  limits. There is no evidence of mass lesion, hemorrhage or acute infarction. IMPRESSION: Minimal diffuse cortical atrophy. Mild chronic ischemic white matter disease. Old right cerebellar infarction. No acute intracranial abnormality seen. Electronically Signed   By: Marijo Conception, M.D.   On: 10/14/2015 09:42    EKG: Independently reviewed. Sinus rhythm Since previous tracing axis has normalized  Assessment/Plan Principal Problem:   Anemia Active Problems:   Bipolar 1 disorder (HCC)   HTN (hypertension)   AKI (acute kidney injury) ([place])   HLD (hyperlipidemia)   Carotid artery stenosis   Acid reflux disease with ulcer   Hyperglycemia  #1 anemia. Normocytic. Hemoglobin 7.3 on admission chart review indicates hemoglobin 12.2 weeks ago. FOBT positive. He takes Plavix and ibuprofen. History of reflux with ulcer. Currently hemodynamically stable -Admit to telemetry -Obtain serial CBCs -Transfuse 2 units of packed RBCs -Anemia panel -Clear liquids for now -Protonix intravenously (bolus and gtt) -Hold Plavix -Await GI recommendations  #2 hypotension. May be multifactorial related to #1 in the setting of dehydration related to recent flulike illness as well as antihypertensives then acute kidney injury. Patient with a history of hypertension. Home medications include lisinopril and prazosin. Systolic blood pressure 80 on presentation. Improved with fluid resuscitation -Continue IV fluids  -Hold home antihypertensives -Monitor closely -Resume home medications as indicated  #3. Acute kidney injury. Likely related to above. Suspect there may be a chronic component. Creatinine 2.02 on admission. Chart review indicates written 1.6 1 month ago. Patient denies ever being informed of any chronic kidney disease -Gentle IV fluids -Hold nephrotoxins -Monitor urine output -Recheck in the morning -If no improvement consider renal ultrasound  #4. Hyperglycemia. Serum glucose 164 on presentation.  Denies any history of diabetes. -Obtain a hemoglobin A1c -Monitor CBG sliding scale insulin as indicated  #5. Headache/dizziness/bilateral leg pain. Likely related to above. CT of the head without acute abnormality.Chest x-ray with no active cardiopulmonary disease. - See therapies for #1 and #2 -Supportive therapies in the form of analgesia -Monitor  6. History of stroke/carotid artery stenosis/hyperlipidemia. CVA 6 weeks ago. Chart review indicates right occipital CVA. Stable at baseline     Code Status: full DVT Prophylaxis: Family Communication: wife at bedside Disposition Plan: home when ready  Time spent: 79 minutes  [facility] M [facility]

## 2015-10-14 NOTE — Consult Note (Signed)
Referring Provider: No ref. provider found Primary Care Physician:  Verline Lema, MD Primary Gastroenterologist:  Althia Forts  Reason for Consultation:  Anemia and heme positive stool  HPI: Jason [name] is a 70 y.o. male with a past medical history that includes hypertension, recent stroke in February at which time he was started on Plavix, bipolar disorder, presents to the emergency department with the chief complaint of generalized weakness and shortness of breath with dizziness. Initial evaluation in the emergency department revealed a hemoglobin of 7.3 grams compared to 12.0 grams just 2 weeks ago, a creatinine of 3.02, with positive FOBT.  Receiving 2 units PRBC's and has been placed on PPI gtt.  Information is obtained from the patient and the wife is at the bedside. He reports a gradual dyspnea on exertion as well as generalized weakness over the last couple of weeks. States he had a stroke about 6 weeks ago and was placed on Plavix.  He also takes approximately 6 ibuprofen daily for the past 6 months or so for general aches and pains.  He says that since starting the Plavix he's had intermittent very dark stools that he thought would just go away.  No red blood per rectum noted.  Had a lot of nausea last night but no vomiting.  Also had some mid to upper abdominal discomfort.  He does not remember ever having an endoscopy but remembers having a colonoscopy approximately 2-3 years ago at the [place]. He reports having polyps removed he has no recollection of any further results.  His wife will bring in report from home.   Past Medical History  Diagnosis Date  . Hypertension   . Stroke ([place])   . Prostate cancer ([place])   . Depression   . Bipolar 1 disorder ([place])   . Acid reflux disease with ulcer   . Anemia   . AKI (acute kidney injury) [facility])     Past Surgical History  Procedure Laterality Date  . Prostate surgery    . Back surgery      Prior to Admission medications     Medication Sig Start Date End Date Taking? Authorizing Provider  atorvastatin (LIPITOR) 40 MG tablet Take 1 tablet (40 mg total) by mouth daily at 6 PM. 04/14/14  Yes Maryann Mikhail, DO  clopidogrel (PLAVIX) 75 MG tablet Take 1 tablet (75 mg total) by mouth daily. 09/01/15  Yes Costin Karlyne Greenspan, MD  FLUoxetine (PROZAC) 20 MG capsule Take 60 mg by mouth daily.   Yes Historical Provider, MD  gabapentin (NEURONTIN) 300 MG capsule Take 300 mg by mouth 3 (three) times daily.    Yes Historical Provider, MD  lisinopril (PRINIVIL,ZESTRIL) 20 MG tablet Take 40 mg by mouth daily.   Yes Historical Provider, MD  mirtazapine (REMERON) 45 MG tablet Take 45 mg by mouth at bedtime.   Yes Historical Provider, MD  Omega-3 Fatty Acids (FISH OIL) 1000 MG CAPS Take 2,000 mg by mouth daily.   Yes Historical Provider, MD  omeprazole (PRILOSEC) 20 MG capsule Take 20 mg by mouth at bedtime.    Yes Historical Provider, MD  prazosin (MINIPRESS) 1 MG capsule Take 3 mg by mouth at bedtime.   Yes Historical Provider, MD  aspirin EC 81 MG tablet Take 1 tablet (81 mg total) by mouth daily. Patient not taking: Reported on 10/14/2015 09/01/15   Caren Griffins, MD  oxyCODONE-acetaminophen (PERCOCET/ROXICET) 5-325 MG tablet Take 1 tablet by mouth every 4 (four) hours as needed for  moderate pain or severe pain. 09/01/15   [place], MD    Current Facility-Administered Medications  Medication Dose Route Frequency Provider Last Rate Last Dose  . 0.9 %  sodium chloride infusion   Intravenous Continuous Radene Gunning, NP 75 mL/hr at 10/14/15 1212    . acetaminophen (TYLENOL) tablet 650 mg  650 mg Oral Q6H PRN Radene Gunning, NP       Or  . acetaminophen (TYLENOL) suppository 650 mg  650 mg Rectal Q6H PRN Radene Gunning, NP      . gabapentin (NEURONTIN) capsule 300 mg  300 mg Oral TID Radene Gunning, NP   300 mg at 10/14/15 1212  . insulin aspart (novoLOG) injection 0-5 Units  0-5 Units Subcutaneous QHS Lezlie Octave Black, NP      .  insulin aspart (novoLOG) injection 0-9 Units  0-9 Units Subcutaneous TID WC Radene Gunning, NP   0 Units at 10/14/15 1202  . morphine 2 MG/ML injection 1 mg  1 mg Intravenous Q3H PRN Radene Gunning, NP      . ondansetron [facility]) tablet 4 mg  4 mg Oral Q6H PRN Radene Gunning, NP       Or  . ondansetron [facility]) injection 4 mg  4 mg Intravenous Q6H PRN Radene Gunning, NP      . oxyCODONE-acetaminophen (PERCOCET/ROXICET) 5-325 MG per tablet 1 tablet  1 tablet Oral Q4H PRN Radene Gunning, NP      . pantoprazole (PROTONIX) injection 40 mg  40 mg Intravenous Q12H Radene Gunning, NP   40 mg at 10/14/15 1212   Current Outpatient Prescriptions  Medication Sig Dispense Refill  . atorvastatin (LIPITOR) 40 MG tablet Take 1 tablet (40 mg total) by mouth daily at 6 PM. 30 tablet 0  . clopidogrel (PLAVIX) 75 MG tablet Take 1 tablet (75 mg total) by mouth daily. 90 tablet 1  . FLUoxetine (PROZAC) 20 MG capsule Take 60 mg by mouth daily.    Marland Kitchen gabapentin (NEURONTIN) 300 MG capsule Take 300 mg by mouth 3 (three) times daily.     Marland Kitchen lisinopril (PRINIVIL,ZESTRIL) 20 MG tablet Take 40 mg by mouth daily.    . mirtazapine (REMERON) 45 MG tablet Take 45 mg by mouth at bedtime.    . Omega-3 Fatty Acids (FISH OIL) 1000 MG CAPS Take 2,000 mg by mouth daily.    Marland Kitchen omeprazole (PRILOSEC) 20 MG capsule Take 20 mg by mouth at bedtime.     . prazosin (MINIPRESS) 1 MG capsule Take 3 mg by mouth at bedtime.    Marland Kitchen aspirin EC 81 MG tablet Take 1 tablet (81 mg total) by mouth daily. (Patient not taking: Reported on 10/14/2015) 60 tablet 0  . oxyCODONE-acetaminophen (PERCOCET/ROXICET) 5-325 MG tablet Take 1 tablet by mouth every 4 (four) hours as needed for moderate pain or severe pain. 30 tablet 0    Allergies as of 10/14/2015 - Review Complete 10/14/2015  Allergen Reaction Noted  . Penicillins  11/19/2012    Family History  Problem Relation Age of Onset  . Stroke Mother     Hemorrhagic stroke  . Cancer Father     Pancreatic  cancer  . Hypertension Father     Social History   Social History  . Marital Status: Married    Spouse Name: N/A  . Number of Children: N/A  . Years of Education: N/A   Occupational History  . Not on file.   Social  History Main Topics  . Smoking status: Former Smoker    Quit date: 03/06/2003  . Smokeless tobacco: Never Used  . Alcohol Use: No  . Drug Use: No  . Sexual Activity: No   Other Topics Concern  . Not on file   Social History Narrative    Review of Systems: Ten point ROS is O/W negative except as mentioned in HPI.  Physical Exam: Vital signs in last 24 hours: Temp:  [97.3 F (36.3 C)-97.7 F (36.5 C)] 97.7 F (36.5 C) (04/11 1043) Pulse Rate:  [76-94] 84 (04/11 1115) Resp:  [12-28] 13 (04/11 1045) BP: (85-128)/(51-71) 128/65 mmHg (04/11 1115) SpO2:  [93 %-100 %] 99 % (04/11 1115)   General:  Alert, Well-developed, well-nourished, pleasant and cooperative in NAD Head:  Normocephalic and atraumatic. Eyes:  Sclera clear, no icterus.  Conjunctiva pale. Ears:  Normal auditory acuity. Mouth:  No deformity or lesions.   Lungs:  Clear throughout to auscultation.  No wheezes, crackles, or rhonchi.  Heart:  Regular rate and rhythm; no murmurs, clicks, rubs, or gallops. Abdomen:  Soft, non-distended.  BS present.  Non-tender.  Rectal:  Deferred.  Was hemoccult positive.  Msk:  Symmetrical without gross deformities. Pulses:  Normal pulses noted. Extremities:  Without clubbing or edema. Neurologic:  Alert and oriented x 4;  grossly normal neurologically. Skin:  Intact without significant lesions or rashes.  Somewhat pale. Psych:  Alert and cooperative. Normal mood and affect.  Lab Results:  Recent Labs  10/14/15 0650 10/14/15 0722  WBC 12.6*  --   HGB 7.3* 8.2*  HCT 23.5* 24.0*  PLT 278  --    BMET  Recent Labs  10/14/15 0650 10/14/15 0722  NA 142 145  K 4.4 4.4  CL 109 107  CO2 21*  --   GLUCOSE 164* 154*  BUN 36* 35*  CREATININE 3.02*  2.80*  CALCIUM 8.7*  --    LFT  Recent Labs  10/14/15 0650  PROT 5.9*  ALBUMIN 3.3*  AST 17  ALT 15*  ALKPHOS 103  BILITOT 0.5   PT/INR  Recent Labs  10/14/15 0650  LABPROT 15.3*  INR 1.19   Studies/Results: Dg Chest 2 View  10/14/2015  CLINICAL DATA:  Weakness. EXAM: CHEST  2 VIEW COMPARISON:  August 30, 2015. FINDINGS: The heart size and mediastinal contours are within normal limits. Both lungs are clear. No pneumothorax or pleural effusion is noted. The visualized skeletal structures are unremarkable. IMPRESSION: No active cardiopulmonary disease. Electronically Signed   By: Marijo Conception, M.D.   On: 10/14/2015 09:26   Ct Head Wo Contrast  10/14/2015  CLINICAL DATA:  Acute headache, dizziness. EXAM: CT HEAD WITHOUT CONTRAST TECHNIQUE: Contiguous axial images were obtained from the base of the skull through the vertex without intravenous contrast. COMPARISON:  MRI of August 30, 2015.  CT scan of April 30, 2014. FINDINGS: Bony calvarium appears intact. Minimal diffuse cortical atrophy is noted. Mild chronic ischemic white matter disease is noted. Old right inferior cerebellar infarction is noted. No mass effect or midline shift is noted. Ventricular size is within normal limits. There is no evidence of mass lesion, hemorrhage or acute infarction. IMPRESSION: Minimal diffuse cortical atrophy. Mild chronic ischemic white matter disease. Old right cerebellar infarction. No acute intracranial abnormality seen. Electronically Signed   By: Marijo Conception, M.D.   On: 10/14/2015 09:42   IMPRESSION:  -IDA with acute drop in Hgb from 2 weeks ago and now hemoccult positive stools.  Takes Plavix and uses Ibuprofen regularly (6 per day).  Rule out ulcer, etc.  Had colonoscopy at the [place] in [place] about 3 years ago (he has the reports at home so his wife will bring them in). -Recent CVA:  Placed on Plavix about 5-6 weeks ago, now on hold. -AKI:  Cr above baseline.  Likely related to  acute blood loss/dehydration.    PLAN: -EGD 4/12. -Agree with PPI gtt for now. -Closely monitor Hgb and transfuse further prn. -Obtain colonoscopy records, which his wife will bring in.  Juleen Sorrels D.  10/14/2015, 12:14 PM  Pager number [phone]

## 2015-10-14 NOTE — ED Notes (Signed)
Patient complains of sudden onset of right leg pain from the calf down and that his leg feels "dead", cms intact, er md notified and at bedside

## 2015-10-14 NOTE — ED Provider Notes (Signed)
CSN: [id number]     Arrival date & time 10/14/15  2831 History   First MD Initiated Contact with Patient 10/14/15 [phone]     Chief Complaint  Patient presents with  . Hypotension     (Consider location/radiation/quality/duration/timing/severity/associated sxs/prior Treatment) HPI  Pt presenting with c/o feeling weak all over.  He also c/o headache and states this is how he felt with prior strokes.  He was hospitalized 5 weeks ago for stroke that affected his vision.  He states the over the past 1-2 days he has felt cramping in his left arm and leg with tingling.  He also states he feels lightheaded and weak.  Denies vertigo.  He denies focal weakness but states he feels very weak in both arms and both legs.  No chest pain, no difficulty breathing.  No changes in vision.  He is taking plavix and aspirin for prior stroke.  He denies having vomiting or diarrhea.  No cough.  No fainting.  he was hypotensive for EMS with SBP in the 80s.  There are no other associated systemic symptoms, there are no other alleviating or modifying factors.   Past Medical History  Diagnosis Date  . Hypertension   . Stroke ([place])   . Prostate cancer ([place])   . Depression   . Bipolar 1 disorder ([place])   . Acid reflux disease with ulcer   . Anemia   . AKI (acute kidney injury) [facility])    Past Surgical History  Procedure Laterality Date  . Prostate surgery    . Back surgery     Family History  Problem Relation Age of Onset  . Stroke Mother     Hemorrhagic stroke  . Cancer Father     Pancreatic cancer  . Hypertension Father    Social History  Substance Use Topics  . Smoking status: Former Smoker    Quit date: 03/06/2003  . Smokeless tobacco: Never Used  . Alcohol Use: No    Review of Systems  ROS reviewed and all otherwise negative except for mentioned in HPI    Allergies  Penicillins  Home Medications   Prior to Admission medications   Medication Sig Start Date End Date Taking? Authorizing  Provider  atorvastatin (LIPITOR) 40 MG tablet Take 1 tablet (40 mg total) by mouth daily at 6 PM. 04/14/14  Yes Maryann Mikhail, DO  clopidogrel (PLAVIX) 75 MG tablet Take 1 tablet (75 mg total) by mouth daily. 09/01/15  Yes Costin Karlyne Greenspan, MD  FLUoxetine (PROZAC) 20 MG capsule Take 60 mg by mouth daily.   Yes Historical Provider, MD  gabapentin (NEURONTIN) 300 MG capsule Take 300 mg by mouth 3 (three) times daily.    Yes Historical Provider, MD  lisinopril (PRINIVIL,ZESTRIL) 20 MG tablet Take 40 mg by mouth daily.   Yes Historical Provider, MD  mirtazapine (REMERON) 45 MG tablet Take 45 mg by mouth at bedtime.   Yes Historical Provider, MD  Omega-3 Fatty Acids (FISH OIL) 1000 MG CAPS Take 2,000 mg by mouth daily.   Yes Historical Provider, MD  omeprazole (PRILOSEC) 20 MG capsule Take 20 mg by mouth at bedtime.    Yes Historical Provider, MD  prazosin (MINIPRESS) 1 MG capsule Take 3 mg by mouth at bedtime.   Yes Historical Provider, MD  aspirin EC 81 MG tablet Take 1 tablet (81 mg total) by mouth daily. Patient not taking: Reported on 10/14/2015 09/01/15   Caren Griffins, MD  oxyCODONE-acetaminophen (PERCOCET/ROXICET) 5-325 MG tablet Take 1  tablet by mouth every 4 (four) hours as needed for moderate pain or severe pain. 09/01/15   Costin Karlyne Greenspan, MD   BP 147/74 mmHg  Pulse 80  Temp(Src) 97.9 F (36.6 C) (Oral)  Resp 18  Ht '5\' 4"'$  (1.626 m)  Wt 77.3 kg  BMI 29.24 kg/m2  SpO2 96%  Vitals reviewed Physical Exam  Physical Examination: General appearance - tired appearing, somnolent appearing, slow to answer questions, but in no distress Mental status - alert, oriented to person, place, and time Eyes - pupils equal and reactive, extraocular eye movements intact Mouth - mucous membranes moist, pharynx normal without lesions Chest - clear to auscultation, no wheezes, rales or rhonchi, symmetric air entry Heart - normal rate, regular rhythm, normal S1, S2, no murmurs, rubs, clicks or  gallops Abdomen - soft, nontender, nondistended, no masses or organomegaly Neurological - somnolent but alert and able to answer questions slowly, oriented x 3, cranial nerves 2-12 tested and intact, global weakness of extremities but with encouragement 4/5 strength in ext x 4, sensation intact Extremities - peripheral pulses normal, no pedal edema, no clubbing or cyanosis Skin - normal coloration and turgor, no rashes  ED Course  Procedures (including critical care time)  CRITICAL CARE Performed by: Threasa Beards Total critical care time: 45 minutes Critical care time was exclusive of separately billable procedures and treating other patients. Critical care was necessary to treat or prevent imminent or life-threatening deterioration. Critical care was time spent personally by me on the following activities: development of treatment plan with patient and/or surrogate as well as nursing, discussions with consultants, evaluation of patient's response to treatment, examination of patient, obtaining history from patient or surrogate, ordering and performing treatments and interventions, ordering and review of laboratory studies, ordering and review of radiographic studies, pulse oximetry and re-evaluation of patient's condition. Labs Review Labs Reviewed  URINE CULTURE - Abnormal; Notable for the following:    Culture 1,000 COLONIES/mL INSIGNIFICANT GROWTH (*)    All other components within normal limits  PROTIME-INR - Abnormal; Notable for the following:    Prothrombin Time 15.3 (*)    All other components within normal limits  CBC - Abnormal; Notable for the following:    WBC 12.6 (*)    RBC 2.59 (*)    Hemoglobin 7.3 (*)    HCT 23.5 (*)    RDW 16.5 (*)    All other components within normal limits  DIFFERENTIAL - Abnormal; Notable for the following:    Neutro Abs 10.8 (*)    All other components within normal limits  COMPREHENSIVE METABOLIC PANEL - Abnormal; Notable for the  following:    CO2 21 (*)    Glucose, Bld 164 (*)    BUN 36 (*)    Creatinine, Ser 3.02 (*)    Calcium 8.7 (*)    Total Protein 5.9 (*)    Albumin 3.3 (*)    ALT 15 (*)    GFR calc non Af Amer 20 (*)    GFR calc Af Amer 23 (*)    All other components within normal limits  URINALYSIS, ROUTINE W REFLEX MICROSCOPIC (NOT AT [facility]) - Abnormal; Notable for the following:    Glucose, UA 100 (*)    All other components within normal limits  FOLATE - Abnormal; Notable for the following:    Folate 5.5 (*)    All other components within normal limits  IRON AND TIBC - Abnormal; Notable for the following:    Iron  30 (*)    Saturation Ratios 8 (*)    All other components within normal limits  FERRITIN - Abnormal; Notable for the following:    Ferritin 15 (*)    All other components within normal limits  RETICULOCYTES - Abnormal; Notable for the following:    Retic Ct Pct 3.7 (*)    RBC. 2.60 (*)    All other components within normal limits  HEMOGLOBIN A1C - Abnormal; Notable for the following:    Hgb A1c MFr Bld 6.8 (*)    All other components within normal limits  CBC - Abnormal; Notable for the following:    RBC 3.11 (*)    Hemoglobin 8.9 (*)    HCT 27.7 (*)    RDW 15.7 (*)    All other components within normal limits  BASIC METABOLIC PANEL - Abnormal; Notable for the following:    Chloride 113 (*)    Creatinine, Ser 1.62 (*)    Calcium 8.4 (*)    GFR calc non Af Amer 42 (*)    GFR calc Af Amer 49 (*)    All other components within normal limits  CBC - Abnormal; Notable for the following:    RBC 3.27 (*)    Hemoglobin 9.7 (*)    HCT 29.3 (*)    RDW 15.8 (*)    All other components within normal limits  I-STAT CHEM 8, ED - Abnormal; Notable for the following:    BUN 35 (*)    Creatinine, Ser 2.80 (*)    Glucose, Bld 154 (*)    Hemoglobin 8.2 (*)    HCT 24.0 (*)    All other components within normal limits  POC OCCULT BLOOD, ED - Abnormal; Notable for the following:     Fecal Occult Bld POSITIVE (*)    All other components within normal limits  CULTURE, BLOOD (ROUTINE X 2)  CULTURE, BLOOD (ROUTINE X 2)  ETHANOL  APTT  URINE RAPID DRUG SCREEN, HOSP PERFORMED  VITAMIN B12  GLUCOSE, CAPILLARY  GLUCOSE, CAPILLARY  GLUCOSE, CAPILLARY  I-STAT TROPOININ, ED  I-STAT CG4 LACTIC ACID, ED  CBG MONITORING, ED  I-STAT CG4 LACTIC ACID, ED  TYPE AND SCREEN  PREPARE RBC (CROSSMATCH)  ABO/RH    Imaging Review Dg Chest 2 View  10/14/2015  CLINICAL DATA:  Weakness. EXAM: CHEST  2 VIEW COMPARISON:  August 30, 2015. FINDINGS: The heart size and mediastinal contours are within normal limits. Both lungs are clear. No pneumothorax or pleural effusion is noted. The visualized skeletal structures are unremarkable. IMPRESSION: No active cardiopulmonary disease. Electronically Signed   By: Marijo Conception, M.D.   On: 10/14/2015 09:26   Ct Head Wo Contrast  10/14/2015  CLINICAL DATA:  Acute headache, dizziness. EXAM: CT HEAD WITHOUT CONTRAST TECHNIQUE: Contiguous axial images were obtained from the base of the skull through the vertex without intravenous contrast. COMPARISON:  MRI of August 30, 2015.  CT scan of April 30, 2014. FINDINGS: Bony calvarium appears intact. Minimal diffuse cortical atrophy is noted. Mild chronic ischemic white matter disease is noted. Old right inferior cerebellar infarction is noted. No mass effect or midline shift is noted. Ventricular size is within normal limits. There is no evidence of mass lesion, hemorrhage or acute infarction. IMPRESSION: Minimal diffuse cortical atrophy. Mild chronic ischemic white matter disease. Old right cerebellar infarction. No acute intracranial abnormality seen. Electronically Signed   By: Marijo Conception, M.D.   On: 10/14/2015 09:42   I have  personally reviewed and evaluated these images and lab results as part of my medical decision-making.   EKG Interpretation   Date/Time:  Tuesday October 14 2015 06:58:25  EDT Ventricular Rate:  79 PR Interval:  199 QRS Duration: 92 QT Interval:  413 QTC Calculation: 473 R Axis:   53 Text Interpretation:  Sinus rhythm Since previous tracing axis has  normalized Confirmed by Canary Brim  MD, MARTHA [phone]) on 10/14/2015 7:18:56 AM  Also confirmed by Canary Brim  MD, MARTHA [phone]), editor Gilford Rile, CCT, [place]  ([id number])  on 10/14/2015 9:32:02 AM      MDM   Final diagnoses:  Acid reflux disease with ulcer  hyptension Symptomatic anemia requiring transfusion Acute on chronic renal failure Altered mental status  Pt presenting with altered mental status, headache, generalized weakness- pt treated with IV fluids, head CT obtained which showed no acute abnormality, pt found to be anemic, with hemepositive stool.  He is on plavix and ASA for recent stroke diagnosis.  He is found to have acute on chronic renal failure- hypotensive on arrival.  Pt feels improved after IV fluids, PRBC transfusion ordered for him as well.  GI consulted due to likely GI source of anemia.   8:16 AM pt c/o right lower extremity cramping and pain.  He is requesting pain medication.  I have d/w patient that I do not want to give him a sedating medication due to his mental status.  I have encouraged him to go for his head CT as this is the important first step in his stroke r/o.  Cramping and weakness may be coming from his worsening renal failure.  He is receiving IV fluids.    10:28 AM d/w Dr. Trixie Deis, GI, they will consult on the patient.  Awaiting callback from hospitalist for admission.    11:26 AM d/w triad for admission- pt will need stepdown bed.  Dr. Marily Memos attending- temp admission orders written  Alfonzo Beers, MD 10/15/15 1601

## 2015-10-14 NOTE — ED Notes (Signed)
Pt from home by [facility]. Complains headache and dizzy and weak. Pain in the neck and back of the head, no light sensitive. No trauma. Pt has a hx of stroke

## 2015-10-15 ENCOUNTER — Inpatient Hospital Stay (HOSPITAL_COMMUNITY): Payer: Medicare Other | Admitting: Anesthesiology

## 2015-10-15 ENCOUNTER — Encounter (HOSPITAL_COMMUNITY): Payer: Self-pay | Admitting: *Deleted

## 2015-10-15 ENCOUNTER — Encounter (HOSPITAL_COMMUNITY)
Admission: EM | Disposition: A | Discharge status: Home / Self Care | Payer: Self-pay | Source: Home / Self Care | Attending: Family Medicine

## 2015-10-15 DIAGNOSIS — D125 Benign neoplasm of sigmoid colon: Secondary | ICD-10-CM | POA: Diagnosis not present

## 2015-10-15 DIAGNOSIS — N183 Chronic kidney disease, stage 3 (moderate): Secondary | ICD-10-CM

## 2015-10-15 DIAGNOSIS — K921 Melena: Secondary | ICD-10-CM | POA: Diagnosis not present

## 2015-10-15 DIAGNOSIS — E785 Hyperlipidemia, unspecified: Secondary | ICD-10-CM | POA: Diagnosis not present

## 2015-10-15 DIAGNOSIS — D72829 Elevated white blood cell count, unspecified: Secondary | ICD-10-CM

## 2015-10-15 DIAGNOSIS — I10 Essential (primary) hypertension: Secondary | ICD-10-CM | POA: Diagnosis not present

## 2015-10-15 DIAGNOSIS — D500 Iron deficiency anemia secondary to blood loss (chronic): Secondary | ICD-10-CM | POA: Diagnosis not present

## 2015-10-15 DIAGNOSIS — K5730 Diverticulosis of large intestine without perforation or abscess without bleeding: Secondary | ICD-10-CM | POA: Diagnosis not present

## 2015-10-15 DIAGNOSIS — K922 Gastrointestinal hemorrhage, unspecified: Secondary | ICD-10-CM | POA: Diagnosis not present

## 2015-10-15 DIAGNOSIS — D649 Anemia, unspecified: Secondary | ICD-10-CM | POA: Diagnosis not present

## 2015-10-15 DIAGNOSIS — D123 Benign neoplasm of transverse colon: Secondary | ICD-10-CM | POA: Diagnosis not present

## 2015-10-15 DIAGNOSIS — D62 Acute posthemorrhagic anemia: Secondary | ICD-10-CM | POA: Diagnosis not present

## 2015-10-15 HISTORY — PX: ESOPHAGOGASTRODUODENOSCOPY: SHX5428

## 2015-10-15 LAB — TYPE AND SCREEN
ABO/RH(D): O POS
ANTIBODY SCREEN: NEGATIVE
UNIT DIVISION: 0
UNIT DIVISION: 0

## 2015-10-15 LAB — URINE CULTURE

## 2015-10-15 LAB — CBC
HEMATOCRIT: 29.3 % — AB (ref 39.0–52.0)
Hemoglobin: 9.7 g/dL — ABNORMAL LOW (ref 13.0–17.0)
MCH: 29.7 pg (ref 26.0–34.0)
MCHC: 33.1 g/dL (ref 30.0–36.0)
MCV: 89.6 fL (ref 78.0–100.0)
Platelets: 247 10*3/uL (ref 150–400)
RBC: 3.27 MIL/uL — ABNORMAL LOW (ref 4.22–5.81)
RDW: 15.8 % — AB (ref 11.5–15.5)
WBC: 7.4 10*3/uL (ref 4.0–10.5)

## 2015-10-15 LAB — BASIC METABOLIC PANEL
Anion gap: 10 (ref 5–15)
BUN: 19 mg/dL (ref 6–20)
CHLORIDE: 113 mmol/L — AB (ref 101–111)
CO2: 22 mmol/L (ref 22–32)
Calcium: 8.4 mg/dL — ABNORMAL LOW (ref 8.9–10.3)
Creatinine, Ser: 1.62 mg/dL — ABNORMAL HIGH (ref 0.61–1.24)
GFR calc Af Amer: 49 mL/min — ABNORMAL LOW (ref >60)
GFR calc non Af Amer: 42 mL/min — ABNORMAL LOW (ref >60)
GLUCOSE: 87 mg/dL (ref 65–99)
POTASSIUM: 4.7 mmol/L (ref 3.5–5.1)
Sodium: 145 mmol/L (ref 135–145)

## 2015-10-15 LAB — GLUCOSE, CAPILLARY
GLUCOSE-CAPILLARY: 75 mg/dL (ref 65–99)
Glucose-Capillary: 75 mg/dL (ref 65–99)
Glucose-Capillary: 72 mg/dL (ref 65–99)
Glucose-Capillary: 81 mg/dL (ref 65–99)

## 2015-10-15 LAB — HEMOGLOBIN A1C
Hgb A1c MFr Bld: 6.8 % — ABNORMAL HIGH (ref 4.8–5.6)
Mean Plasma Glucose: 148 mg/dL

## 2015-10-15 SURGERY — EGD (ESOPHAGOGASTRODUODENOSCOPY)
Anesthesia: Monitor Anesthesia Care

## 2015-10-15 MED ORDER — PEG-KCL-NACL-NASULF-NA ASC-C 100 G PO SOLR
0.5000 | Freq: Once | ORAL | Status: AC
  Administered 2015-10-16: 100 g via ORAL

## 2015-10-15 MED ORDER — PROPOFOL 500 MG/50ML IV EMUL
INTRAVENOUS | Status: DC | PRN
  Administered 2015-10-15: 100 ug/kg/min via INTRAVENOUS

## 2015-10-15 MED ORDER — METOCLOPRAMIDE HCL 5 MG/ML IJ SOLN: 10.0000 mg | Freq: Four times a day (QID) | INTRAMUSCULAR | Status: AC | PRN

## 2015-10-15 MED ORDER — PANTOPRAZOLE SODIUM 40 MG PO TBEC
40.0000 mg | DELAYED_RELEASE_TABLET | Freq: Every day | ORAL | Status: DC
  Administered 2015-10-16 – 2015-10-17 (×2): 40 mg via ORAL
  Filled 2015-10-15 (×2): qty 1

## 2015-10-15 MED ORDER — DEXTROSE 5 % IV SOLN
INTRAVENOUS | Status: DC
  Administered 2015-10-15 (×2): via INTRAVENOUS

## 2015-10-15 MED ORDER — SODIUM CHLORIDE 0.9 % IV SOLN
INTRAVENOUS | Status: AC
  Administered 2015-10-15: 12:00:00 via INTRAVENOUS

## 2015-10-15 MED ORDER — PROPOFOL 10 MG/ML IV BOLUS
INTRAVENOUS | Status: DC | PRN
  Administered 2015-10-15: 25 mg via INTRAVENOUS
  Administered 2015-10-15 (×2): 10 mg via INTRAVENOUS

## 2015-10-15 MED ORDER — PEG-KCL-NACL-NASULF-NA ASC-C 100 G PO SOLR
0.5000 | Freq: Once | ORAL | Status: AC
  Administered 2015-10-15: 100 g via ORAL
  Filled 2015-10-15: qty 1

## 2015-10-15 MED ORDER — PEG-KCL-NACL-NASULF-NA ASC-C 100 G PO SOLR: 1.0000 | Freq: Once | ORAL | Status: DC

## 2015-10-15 MED ORDER — LACTATED RINGERS IV SOLN
INTRAVENOUS | Status: DC | PRN
Start: 2015-10-15 — End: 2015-10-15
  Administered 2015-10-15: 13:00:00 via INTRAVENOUS

## 2015-10-15 NOTE — Interval H&P Note (Signed)
History and Physical Interval Note:  10/15/2015 1:53 PM  Jason [name]  has presented today for surgery, with the diagnosis of Acute drop in Hgb and heme positive stools  The various methods of treatment have been discussed with the patient and family. After consideration of risks, benefits and other options for treatment, the patient has consented to  Procedure(s): ESOPHAGOGASTRODUODENOSCOPY (EGD) (N/A) as a surgical intervention .  The patient's history has been reviewed, patient examined, no change in status, stable for surgery.  I have reviewed the patient's chart and labs.  Questions were answered to the patient's satisfaction.     Renelda Loma Braedon Sjogren

## 2015-10-15 NOTE — Transfer of Care (Signed)
Immediate Anesthesia Transfer of Care Note  Patient: Jason [name]  Procedure(s) Performed: Procedure(s): ESOPHAGOGASTRODUODENOSCOPY (EGD) (N/A)  Patient Location: PACU and Endoscopy Unit  Anesthesia Type:MAC  Level of Consciousness: awake, alert  and oriented  Airway & Oxygen Therapy: Patient Spontanous Breathing and Patient connected to nasal cannula oxygen  Post-op Assessment: Report given to RN and Post -op Vital signs reviewed and stable  Post vital signs: Reviewed and stable  Last Vitals:  Filed Vitals:   10/15/15 1306 10/15/15 1434  BP: 160/89   Pulse: 78 73  Temp:    Resp: 11 20    Complications: No apparent anesthesia complications

## 2015-10-15 NOTE — H&P (View-Only) (Signed)
Referring Provider: No ref. provider found Primary Care Physician:  Verline Lema, MD Primary Gastroenterologist:  Althia Forts  Reason for Consultation:  Anemia and heme positive stool  HPI: Jason [name] is a 70 y.o. male with a past medical history that includes hypertension, recent stroke in February at which time he was started on Plavix, bipolar disorder, presents to the emergency department with the chief complaint of generalized weakness and shortness of breath with dizziness. Initial evaluation in the emergency department revealed a hemoglobin of 7.3 grams compared to 12.0 grams just 2 weeks ago, a creatinine of 3.02, with positive FOBT.  Receiving 2 units PRBC's and has been placed on PPI gtt.  Information is obtained from the patient and the wife is at the bedside. He reports a gradual dyspnea on exertion as well as generalized weakness over the last couple of weeks. States he had a stroke about 6 weeks ago and was placed on Plavix.  He also takes approximately 6 ibuprofen daily for the past 6 months or so for general aches and pains.  He says that since starting the Plavix he's had intermittent very dark stools that he thought would just go away.  No red blood per rectum noted.  Had a lot of nausea last night but no vomiting.  Also had some mid to upper abdominal discomfort.  He does not remember ever having an endoscopy but remembers having a colonoscopy approximately 2-3 years ago at the [place]. He reports having polyps removed he has no recollection of any further results.  His wife will bring in report from home.   Past Medical History  Diagnosis Date  . Hypertension   . Stroke ([place])   . Prostate cancer ([place])   . Depression   . Bipolar 1 disorder ([place])   . Acid reflux disease with ulcer   . Anemia   . AKI (acute kidney injury) [facility])     Past Surgical History  Procedure Laterality Date  . Prostate surgery    . Back surgery      Prior to Admission medications     Medication Sig Start Date End Date Taking? Authorizing Provider  atorvastatin (LIPITOR) 40 MG tablet Take 1 tablet (40 mg total) by mouth daily at 6 PM. 04/14/14  Yes Maryann Mikhail, DO  clopidogrel (PLAVIX) 75 MG tablet Take 1 tablet (75 mg total) by mouth daily. 09/01/15  Yes Costin Karlyne Greenspan, MD  FLUoxetine (PROZAC) 20 MG capsule Take 60 mg by mouth daily.   Yes Historical Provider, MD  gabapentin (NEURONTIN) 300 MG capsule Take 300 mg by mouth 3 (three) times daily.    Yes Historical Provider, MD  lisinopril (PRINIVIL,ZESTRIL) 20 MG tablet Take 40 mg by mouth daily.   Yes Historical Provider, MD  mirtazapine (REMERON) 45 MG tablet Take 45 mg by mouth at bedtime.   Yes Historical Provider, MD  Omega-3 Fatty Acids (FISH OIL) 1000 MG CAPS Take 2,000 mg by mouth daily.   Yes Historical Provider, MD  omeprazole (PRILOSEC) 20 MG capsule Take 20 mg by mouth at bedtime.    Yes Historical Provider, MD  prazosin (MINIPRESS) 1 MG capsule Take 3 mg by mouth at bedtime.   Yes Historical Provider, MD  aspirin EC 81 MG tablet Take 1 tablet (81 mg total) by mouth daily. Patient not taking: Reported on 10/14/2015 09/01/15   Caren Griffins, MD  oxyCODONE-acetaminophen (PERCOCET/ROXICET) 5-325 MG tablet Take 1 tablet by mouth every 4 (four) hours as needed for  moderate pain or severe pain. 09/01/15   Yeoman, MD    Current Facility-Administered Medications  Medication Dose Route Frequency Provider Last Rate Last Dose  . 0.9 %  sodium chloride infusion   Intravenous Continuous Radene Gunning, NP 75 mL/hr at 10/14/15 1212    . acetaminophen (TYLENOL) tablet 650 mg  650 mg Oral Q6H PRN Radene Gunning, NP       Or  . acetaminophen (TYLENOL) suppository 650 mg  650 mg Rectal Q6H PRN Radene Gunning, NP      . gabapentin (NEURONTIN) capsule 300 mg  300 mg Oral TID Radene Gunning, NP   300 mg at 10/14/15 1212  . insulin aspart (novoLOG) injection 0-5 Units  0-5 Units Subcutaneous QHS Lezlie Octave Black, NP      .  insulin aspart (novoLOG) injection 0-9 Units  0-9 Units Subcutaneous TID WC Radene Gunning, NP   0 Units at 10/14/15 1202  . morphine 2 MG/ML injection 1 mg  1 mg Intravenous Q3H PRN Radene Gunning, NP      . ondansetron [facility]) tablet 4 mg  4 mg Oral Q6H PRN Radene Gunning, NP       Or  . ondansetron [facility]) injection 4 mg  4 mg Intravenous Q6H PRN Radene Gunning, NP      . oxyCODONE-acetaminophen (PERCOCET/ROXICET) 5-325 MG per tablet 1 tablet  1 tablet Oral Q4H PRN Radene Gunning, NP      . pantoprazole (PROTONIX) injection 40 mg  40 mg Intravenous Q12H Radene Gunning, NP   40 mg at 10/14/15 1212   Current Outpatient Prescriptions  Medication Sig Dispense Refill  . atorvastatin (LIPITOR) 40 MG tablet Take 1 tablet (40 mg total) by mouth daily at 6 PM. 30 tablet 0  . clopidogrel (PLAVIX) 75 MG tablet Take 1 tablet (75 mg total) by mouth daily. 90 tablet 1  . FLUoxetine (PROZAC) 20 MG capsule Take 60 mg by mouth daily.    Marland Kitchen gabapentin (NEURONTIN) 300 MG capsule Take 300 mg by mouth 3 (three) times daily.     Marland Kitchen lisinopril (PRINIVIL,ZESTRIL) 20 MG tablet Take 40 mg by mouth daily.    . mirtazapine (REMERON) 45 MG tablet Take 45 mg by mouth at bedtime.    . Omega-3 Fatty Acids (FISH OIL) 1000 MG CAPS Take 2,000 mg by mouth daily.    Marland Kitchen omeprazole (PRILOSEC) 20 MG capsule Take 20 mg by mouth at bedtime.     . prazosin (MINIPRESS) 1 MG capsule Take 3 mg by mouth at bedtime.    Marland Kitchen aspirin EC 81 MG tablet Take 1 tablet (81 mg total) by mouth daily. (Patient not taking: Reported on 10/14/2015) 60 tablet 0  . oxyCODONE-acetaminophen (PERCOCET/ROXICET) 5-325 MG tablet Take 1 tablet by mouth every 4 (four) hours as needed for moderate pain or severe pain. 30 tablet 0    Allergies as of 10/14/2015 - Review Complete 10/14/2015  Allergen Reaction Noted  . Penicillins  11/19/2012    Family History  Problem Relation Age of Onset  . Stroke Mother     Hemorrhagic stroke  . Cancer Father     Pancreatic  cancer  . Hypertension Father     Social History   Social History  . Marital Status: Married    Spouse Name: N/A  . Number of Children: N/A  . Years of Education: N/A   Occupational History  . Not on file.   Social  History Main Topics  . Smoking status: Former Smoker    Quit date: 03/06/2003  . Smokeless tobacco: Never Used  . Alcohol Use: No  . Drug Use: No  . Sexual Activity: No   Other Topics Concern  . Not on file   Social History Narrative    Review of Systems: Ten point ROS is O/W negative except as mentioned in HPI.  Physical Exam: Vital signs in last 24 hours: Temp:  [97.3 F (36.3 C)-97.7 F (36.5 C)] 97.7 F (36.5 C) (04/11 1043) Pulse Rate:  [76-94] 84 (04/11 1115) Resp:  [12-28] 13 (04/11 1045) BP: (85-128)/(51-71) 128/65 mmHg (04/11 1115) SpO2:  [93 %-100 %] 99 % (04/11 1115)   General:  Alert, Well-developed, well-nourished, pleasant and cooperative in NAD Head:  Normocephalic and atraumatic. Eyes:  Sclera clear, no icterus.  Conjunctiva pale. Ears:  Normal auditory acuity. Mouth:  No deformity or lesions.   Lungs:  Clear throughout to auscultation.  No wheezes, crackles, or rhonchi.  Heart:  Regular rate and rhythm; no murmurs, clicks, rubs, or gallops. Abdomen:  Soft, non-distended.  BS present.  Non-tender.  Rectal:  Deferred.  Was hemoccult positive.  Msk:  Symmetrical without gross deformities. Pulses:  Normal pulses noted. Extremities:  Without clubbing or edema. Neurologic:  Alert and oriented x 4;  grossly normal neurologically. Skin:  Intact without significant lesions or rashes.  Somewhat pale. Psych:  Alert and cooperative. Normal mood and affect.  Lab Results:  Recent Labs  10/14/15 0650 10/14/15 0722  WBC 12.6*  --   HGB 7.3* 8.2*  HCT 23.5* 24.0*  PLT 278  --    BMET  Recent Labs  10/14/15 0650 10/14/15 0722  NA 142 145  K 4.4 4.4  CL 109 107  CO2 21*  --   GLUCOSE 164* 154*  BUN 36* 35*  CREATININE 3.02*  2.80*  CALCIUM 8.7*  --    LFT  Recent Labs  10/14/15 0650  PROT 5.9*  ALBUMIN 3.3*  AST 17  ALT 15*  ALKPHOS 103  BILITOT 0.5   PT/INR  Recent Labs  10/14/15 0650  LABPROT 15.3*  INR 1.19   Studies/Results: Dg Chest 2 View  10/14/2015  CLINICAL DATA:  Weakness. EXAM: CHEST  2 VIEW COMPARISON:  August 30, 2015. FINDINGS: The heart size and mediastinal contours are within normal limits. Both lungs are clear. No pneumothorax or pleural effusion is noted. The visualized skeletal structures are unremarkable. IMPRESSION: No active cardiopulmonary disease. Electronically Signed   By: Marijo Conception, M.D.   On: 10/14/2015 09:26   Ct Head Wo Contrast  10/14/2015  CLINICAL DATA:  Acute headache, dizziness. EXAM: CT HEAD WITHOUT CONTRAST TECHNIQUE: Contiguous axial images were obtained from the base of the skull through the vertex without intravenous contrast. COMPARISON:  MRI of August 30, 2015.  CT scan of April 30, 2014. FINDINGS: Bony calvarium appears intact. Minimal diffuse cortical atrophy is noted. Mild chronic ischemic white matter disease is noted. Old right inferior cerebellar infarction is noted. No mass effect or midline shift is noted. Ventricular size is within normal limits. There is no evidence of mass lesion, hemorrhage or acute infarction. IMPRESSION: Minimal diffuse cortical atrophy. Mild chronic ischemic white matter disease. Old right cerebellar infarction. No acute intracranial abnormality seen. Electronically Signed   By: Marijo Conception, M.D.   On: 10/14/2015 09:42   IMPRESSION:  -IDA with acute drop in Hgb from 2 weeks ago and now hemoccult positive stools.  Takes Plavix and uses Ibuprofen regularly (6 per day).  Rule out ulcer, etc.  Had colonoscopy at the [place] in [place] about 3 years ago (he has the reports at home so his wife will bring them in). -Recent CVA:  Placed on Plavix about 5-6 weeks ago, now on hold. -AKI:  Cr above baseline.  Likely related to  acute blood loss/dehydration.    PLAN: -EGD 4/12. -Agree with PPI gtt for now. -Closely monitor Hgb and transfuse further prn. -Obtain colonoscopy records, which his wife will bring in.  Tuck Dulworth D.  10/14/2015, 12:14 PM  Pager number [phone]

## 2015-10-15 NOTE — Op Note (Signed)
[facility] Patient Name: Jason [name] Procedure Date : 10/15/2015 MRN: [id number] Attending MD: Carlota Raspberry. Tashina Credit MD, MD Date of Birth: 10-09-1945 CSN: [id number] Age: 70 Admit Type: Inpatient Procedure:                Upper GI endoscopy Indications:              Iron deficiency anemia, Melena, on plavix and                            aspirin, with ibuprofen use Providers:                Carlota Raspberry. Aleyza Salmi MD, MD, Laverta Baltimore, RN,                            Despina Pole, Technician Referring MD:              Medicines:                Monitored Anesthesia Care Complications:            No immediate complications. Estimated blood loss:                            None. Estimated Blood Loss:     Estimated blood loss: none. Procedure:                Pre-Anesthesia Assessment:                           - Prior to the procedure, a History and Physical                            was performed, and patient medications and                            allergies were reviewed. The patient's tolerance of                            previous anesthesia was also reviewed. The risks                            and benefits of the procedure and the sedation                            options and risks were discussed with the patient.                            All questions were answered, and informed consent                            was obtained. Prior Anticoagulants: The patient has                            taken Plavix (clopidogrel), last dose was 2 days  prior to procedure. ASA Grade Assessment: III - A                            patient with severe systemic disease. After                            reviewing the risks and benefits, the patient was                            deemed in satisfactory condition to undergo the                            procedure.                           After obtaining informed consent, the endoscope was                       passed under direct vision. Throughout the                            procedure, the patient's blood pressure, pulse, and                            oxygen saturations were monitored continuously. The                            EG-[id number] ([id number]) scope was introduced through the                            mouth, and advanced to the second part of duodenum.                            The upper GI endoscopy was accomplished without                            difficulty. The patient tolerated the procedure                            well. Scope In: Scope Out: Findings:      Esophagogastric landmarks were identified: the Z-line was found at 42       cm, the gastroesophageal junction was found at 42 cm and the upper       extent of the gastric folds was found at 42 cm from the incisors.      The exam of the esophagus was otherwise normal.      The entire examined stomach was normal. No focal ulcerations or erosions       noted.      The duodenal bulb and second portion of the duodenum were normal. No       ulcerations or AVMs noted Impression:               - Esophagogastric landmarks identified.                           - Normal stomach.                           -  Normal duodenal bulb and second portion of the                            duodenum.                           Overall, normal EGD, no clear pathology for iron                            deficiency anemia / dark stools noted Moderate Sedation:      no moderate sedation given Recommendation:           - Return patient to hospital ward for ongoing care.                           - Clear liquid diet.                           - Continue present medications.                           - Given severe iron deficiency anemia and need for                            plavix without clear etiology on this exam,                            recommend bowel preparation tonight in preparation                            for  colonoscopy tomorrow. If colonoscopy is                            negative, recommend capsule endoscopy to clear the                            small bowel Procedure Code(s):        --- Professional ---                           [phone], Esophagogastroduodenoscopy, flexible,                            transoral; diagnostic, including collection of                            specimen(s) by brushing or washing, when performed                            (separate procedure) Diagnosis Code(s):        --- Professional ---                           D50.9, Iron deficiency anemia, unspecified                           K92.1, Melena (  includes Hematochezia) CPT copyright 2016 American Medical Association. All rights reserved. The codes documented in this report are preliminary and upon coder review may  be revised to meet current compliance requirements. Remo Lipps P. Tamiah Dysart MD, MD 10/15/2015 2:33:13 PM This report has been signed electronically. Number of Addenda: 0

## 2015-10-15 NOTE — Progress Notes (Signed)
Patient ID: Jason [name], male   DOB: 1946-01-13, 70 y.o.   MRN: [id number] [facility] PROGRESS NOTE  Jason [name] [id number]:[id number] DOB: 04-Oct-1945 DOA: 10/14/2015 PCP: Verline Lema, MD  Brief narrative:    70 y.o. male with a past medical history significant for hypertension, recent stroke, on Plavix, further history of bipolar disorder. Patient presented to [facility] with generalized weakness, shortness of breath and dizziness for past week or so prior to this admission. Patient apparently takes about 6 ibuprofen daily for aches and pains for last couple of months.Since starting Plavix, he has had intermittent dark stools but no red blood per rectum.  In ED, initial blood pressure was 85/51 but this has improved with IV fluids to 147/74. His blood work showed hemoglobin of 8.2 creatinine 3.02 and white blood cell count 12.6.patient was given 2 units of PRBC transfusion since the admission. GI has seen him in consultation and plans for endoscopy 10/15/2015.  Assessment/Plan:    Principal Problem: Acute upper GI bleed / acute blood loss anemia - Likely related to ibuprofen use - holding plavix and ibuprofen - Appreciate very much GI recommendations - patient has received total of 2 units of PRBC transfusion since the admission - patient is on Protonix drip - plan for endoscopy today  Active Problems: Acute renal failure superimposed on chronic kidney disease stage III - GFR about one month ago was 42 and creatinine was 1.63. On this admission GFR is 20 and creatinine is 3.02. Most likely this is related to ibuprofen use  - Cr at baseline this am, 1.6, improved with fluids   Controlled diabetes mellitus with diabetic nephropathy without long-term insulin use - Diet controlled   Dyslipidemia associated with type 2 diabetes mellitus - Resume statin therapy after patient back on regular diet   Essential hypertension - Lisinopril on hold because of hypotension on  the admission - We will add hydralazine as needed for blood pressure control, for blood pressure above 140/90  Leukocytosis - Likely reactive, no evidence of acute infectious process  DVT Prophylaxis  - SCD's bilaterally due to risk of bleeding    Code Status: Full.  Family Communication:  plan of care discussed with the patient Disposition Plan: ome once cleared by GI  IV access:  Peripheral IV  Procedures and diagnostic studies:    Dg Chest 2 View 10/14/2015 No active cardiopulmonary disease. Electronically Signed   By: Marijo Conception, M.D.   On: 10/14/2015 09:26   Ct Head Wo Contrast 10/14/2015  Minimal diffuse cortical atrophy. Mild chronic ischemic white matter disease. Old right cerebellar infarction. No acute intracranial abnormality seen. Electronically Signed   By: Marijo Conception, M.D.   On: 10/14/2015 09:42    Medical Consultants:  Gastroenterology, Dr. Stacey Drain   Other Consultants:  None   IAnti-Infectives:   None    [name], MD  [facility] Pager [phone]  Time spent in minutes: 25 minutes  If 7PM-7AM, please contact night-coverage [email or web address] Password [email or web address] 10/15/2015, 11:22 AM   LOS: 1 day    HPI/Subjective: No acute overnight events. Patient reports he feels okay.   Objective: Filed Vitals:   10/14/15 1543 10/14/15 1550 10/14/15 2106 10/15/15 0521  BP:  121/60 110/60 147/74  Pulse:  83 88 80  Temp:  97.6 F (36.4 C) 97.7 F (36.5 C) 97.9 F (36.6 C)  TempSrc:  Oral Oral   Resp:  '18 18 18  '$ Height: '5\' 4"'$  (1.626 m)  Weight: 77.111 kg (170 lb)   77.3 kg (170 lb 6.7 oz)  SpO2:  98% 98% 96%    Intake/Output Summary (Last 24 hours) at 10/15/15 1122 Last data filed at 10/15/15 1034  Gross per 24 hour  Intake 1743.33 ml  Output   1275 ml  Net 468.33 ml    Exam:   General:  Pt is alert, follows commands appropriately, not in acute distress  Cardiovascular: Regular rate and rhythm, S1/S2 appreciated   Respiratory:  Clear to auscultation bilaterally, no wheezing, no crackles, no rhonchi  Abdomen: Soft, non tender, non distended, bowel sounds present  Extremities: No edema, pulses palpable bilaterally  Neuro: Grossly nonfocal  Data Reviewed: Basic Metabolic Panel:  Recent Labs Lab 10/14/15 0650 10/14/15 0722 10/15/15 0839  NA 142 145 145  K 4.4 4.4 4.7  CL 109 107 113*  CO2 21*  --  22  GLUCOSE 164* 154* 87  BUN 36* 35* 19  CREATININE 3.02* 2.80* 1.62*  CALCIUM 8.7*  --  8.4*   Liver Function Tests:  Recent Labs Lab 10/14/15 0650  AST 17  ALT 15*  ALKPHOS 103  BILITOT 0.5  PROT 5.9*  ALBUMIN 3.3*   No results for input(s): LIPASE, AMYLASE in the last 168 hours. No results for input(s): AMMONIA in the last 168 hours. CBC:  Recent Labs Lab 10/14/15 0650 10/14/15 0722 10/14/15 1825 10/15/15 0839  WBC 12.6*  --  9.7 7.4  NEUTROABS 10.8*  --   --   --   HGB 7.3* 8.2* 8.9* 9.7*  HCT 23.5* 24.0* 27.7* 29.3*  MCV 90.7  --  89.1 89.6  PLT 278  --  233 247   Cardiac Enzymes: No results for input(s): CKTOTAL, CKMB, CKMBINDEX, TROPONINI in the last 168 hours. BNP: Invalid input(s): POCBNP CBG:  Recent Labs Lab 10/14/15 1145 10/14/15 1709 10/14/15 2111 10/15/15 0728  GLUCAP 88 74 85 75    Recent Results (from the past 240 hour(s))  Urine culture     Status: Abnormal   Collection Time: 10/14/15  8:37 AM  Result Value Ref Range Status   Specimen Description URINE, CLEAN CATCH  Final   Special Requests NONE  Final   Culture 1,000 COLONIES/mL INSIGNIFICANT GROWTH (A)  Final   Report Status 10/15/2015 FINAL  Final     Scheduled Meds: . gabapentin  300 mg Oral TID  . insulin aspart  0-5 Units Subcutaneous QHS  . insulin aspart  0-9 Units Subcutaneous TID WC   Continuous Infusions: . pantoprozole (PROTONIX) infusion 8 mg/hr (10/15/15 0025)

## 2015-10-15 NOTE — Anesthesia Preprocedure Evaluation (Addendum)
Anesthesia Evaluation  Patient identified by MRN, date of birth, ID band Patient awake    Reviewed: Allergy & Precautions, NPO status , Patient's Chart, lab work & pertinent test results  Airway Mallampati: II  TM Distance: >3 FB Neck ROM: Full    Dental   Pulmonary former smoker,    breath sounds clear to auscultation       Cardiovascular hypertension, Pt. on medications + Peripheral Vascular Disease   Rhythm:Regular Rate:Normal     Neuro/Psych Depression Bipolar Disorder CVA    GI/Hepatic Neg liver ROS, PUD, GERD  ,  Endo/Other  negative endocrine ROS  Renal/GU Renal disease     Musculoskeletal  (+) Arthritis ,   Abdominal   Peds  Hematology  (+) anemia ,   Anesthesia Other Findings   Reproductive/Obstetrics                           Lab Results  Component Value Date   WBC 7.4 10/15/2015   HGB 9.7* 10/15/2015   HCT 29.3* 10/15/2015   MCV 89.6 10/15/2015   PLT 247 10/15/2015   Lab Results  Component Value Date   CREATININE 1.62* 10/15/2015   BUN 19 10/15/2015   NA 145 10/15/2015   K 4.7 10/15/2015   CL 113* 10/15/2015   CO2 22 10/15/2015    Anesthesia Physical Anesthesia Plan  ASA: III  Anesthesia Plan: MAC and General   Post-op Pain Management:    Induction: Intravenous  Airway Management Planned: Natural Airway and Nasal Cannula  Additional Equipment:   Intra-op Plan:   Post-operative Plan:   Informed Consent: I have reviewed the patients History and Physical, chart, labs and discussed the procedure including the risks, benefits and alternatives for the proposed anesthesia with the patient or authorized representative who has indicated his/her understanding and acceptance.     Plan Discussed with: CRNA, Anesthesiologist and Surgeon  Anesthesia Plan Comments:        Anesthesia Quick Evaluation

## 2015-10-15 NOTE — Care Management Note (Addendum)
Case Management Note  Patient Details  Name: Jason [name] MRN: [id number] Date of Birth: 03/11/1946  Subjective/Objective:                 Spoke with patient in the room. He states that his a [place] patient and gets his care there, and all his meds filled there. He has coverage with [facility] as well, but would prefer to use his VA benefits if possible. Patient lives at home with his wife, and extended family. He has history of CVAs and uses a walker as needed. He drives to MD appointments and goes to Dr Dell Ponto at the [facility]. Admitted with anemia and has 2 units transfused.    Action/Plan:  CM will continue to follow for DC planning needs Addendum, DC to home, no CM needs   Expected Discharge Date:                  Expected Discharge Plan:  Home/Self Care  In-House Referral:     Discharge planning Services  CM Consult  Post Acute Care Choice:  NA Choice offered to:     DME Arranged:    DME Agency:     HH Arranged:    HH Agency:     Status of Service:  In process, will continue to follow  Medicare Important Message Given:    Date Medicare IM Given:    Medicare IM give by:    Date Additional Medicare IM Given:    Additional Medicare Important Message give by:     If discussed at [employer] of Stay Meetings, dates discussed:    Additional Comments:  Carles Collet, RN 10/15/2015, 12:01 PM

## 2015-10-16 ENCOUNTER — Encounter (HOSPITAL_COMMUNITY)
Admission: EM | Disposition: A | Discharge status: Home / Self Care | Payer: Self-pay | Source: Home / Self Care | Attending: Family Medicine

## 2015-10-16 ENCOUNTER — Encounter (HOSPITAL_COMMUNITY): Payer: Self-pay | Admitting: *Deleted

## 2015-10-16 DIAGNOSIS — D62 Acute posthemorrhagic anemia: Secondary | ICD-10-CM | POA: Diagnosis not present

## 2015-10-16 DIAGNOSIS — K921 Melena: Secondary | ICD-10-CM | POA: Diagnosis not present

## 2015-10-16 DIAGNOSIS — K922 Gastrointestinal hemorrhage, unspecified: Secondary | ICD-10-CM | POA: Diagnosis not present

## 2015-10-16 DIAGNOSIS — E785 Hyperlipidemia, unspecified: Secondary | ICD-10-CM

## 2015-10-16 DIAGNOSIS — D72829 Elevated white blood cell count, unspecified: Secondary | ICD-10-CM | POA: Diagnosis not present

## 2015-10-16 HISTORY — PX: GIVENS CAPSULE STUDY: SHX5432

## 2015-10-16 HISTORY — PX: COLONOSCOPY: SHX5424

## 2015-10-16 LAB — GLUCOSE, CAPILLARY
GLUCOSE-CAPILLARY: 71 mg/dL (ref 65–99)
GLUCOSE-CAPILLARY: 94 mg/dL (ref 65–99)
Glucose-Capillary: 94 mg/dL (ref 65–99)

## 2015-10-16 SURGERY — COLONOSCOPY
Anesthesia: Moderate Sedation

## 2015-10-16 SURGERY — IMAGING PROCEDURE, GI TRACT, INTRALUMINAL, VIA CAPSULE
Anesthesia: LOCAL

## 2015-10-16 MED ORDER — MIDAZOLAM HCL 5 MG/ML IJ SOLN
INTRAMUSCULAR | Status: AC
  Filled 2015-10-16: qty 1

## 2015-10-16 MED ORDER — DIPHENHYDRAMINE HCL 50 MG/ML IJ SOLN
INTRAMUSCULAR | Status: AC
  Filled 2015-10-16: qty 1

## 2015-10-16 MED ORDER — MIDAZOLAM HCL 5 MG/ML IJ SOLN
INTRAMUSCULAR | Status: AC
  Filled 2015-10-16: qty 2

## 2015-10-16 MED ORDER — SODIUM CHLORIDE 0.9 % IV SOLN: INTRAVENOUS | Status: DC

## 2015-10-16 MED ORDER — DIPHENHYDRAMINE HCL 50 MG/ML IJ SOLN
INTRAMUSCULAR | Status: DC | PRN
Start: 2015-10-16 — End: 2015-10-16
  Administered 2015-10-16: 12.5 mg via INTRAVENOUS
  Administered 2015-10-16: 25 mg via INTRAVENOUS
  Administered 2015-10-16: 12.5 mg via INTRAVENOUS

## 2015-10-16 MED ORDER — MIDAZOLAM HCL 5 MG/5ML IJ SOLN
INTRAMUSCULAR | Status: DC | PRN
  Administered 2015-10-16: 2 mg via INTRAVENOUS
  Administered 2015-10-16 (×2): 1 mg via INTRAVENOUS
  Administered 2015-10-16 (×3): 2 mg via INTRAVENOUS

## 2015-10-16 MED ORDER — FENTANYL CITRATE (PF) 100 MCG/2ML IJ SOLN
INTRAMUSCULAR | Status: DC | PRN
  Administered 2015-10-16 (×5): 25 ug via INTRAVENOUS

## 2015-10-16 MED ORDER — FENTANYL CITRATE (PF) 100 MCG/2ML IJ SOLN
INTRAMUSCULAR | Status: AC
  Filled 2015-10-16: qty 4

## 2015-10-16 SURGICAL SUPPLY — 1 items: TOWEL COTTON PACK 4EA (MISCELLANEOUS) ×4 IMPLANT

## 2015-10-16 NOTE — Progress Notes (Addendum)
Smart pill ingested at 0925   [employer] Capsule was ingested, not smart pill.

## 2015-10-16 NOTE — Anesthesia Postprocedure Evaluation (Signed)
Anesthesia Post Note  Patient: DARIEN KADING  Procedure(s) Performed: Procedure(s) (LRB): ESOPHAGOGASTRODUODENOSCOPY (EGD) (N/A)  Patient location during evaluation: Endoscopy Anesthesia Type: General and MAC Level of consciousness: awake, awake and alert, oriented and patient cooperative Pain management: pain level controlled Vital Signs Assessment: post-procedure vital signs reviewed and stable Respiratory status: spontaneous breathing and respiratory function stable Cardiovascular status: blood pressure returned to baseline and stable Anesthetic complications: no    Last Vitals:  Filed Vitals:   10/15/15 2116 10/16/15 0627  BP: 133/62 159/76  Pulse: 80 80  Temp: 36.9 C 36.6 C  Resp: 18 19    Last Pain:  Filed Vitals:   10/16/15 0628  PainSc: 10-Worst pain ever                 Ledora Delker EDWARD

## 2015-10-16 NOTE — Progress Notes (Signed)
Patient ID: Jason [name], male   DOB: July 27, 1945, 70 y.o.   MRN: [id number] [facility] PROGRESS NOTE  Jason [name] BZJ:[id number] DOB: [birth year] DOA: 10/14/2015 PCP: Verline Lema, MD  Brief narrative:    70 y.o. male with a past medical history significant for hypertension, recent stroke, on Plavix, further history of bipolar disorder. Patient presented to [facility] with generalized weakness, shortness of breath and dizziness for past week or so prior to this admission. Patient apparently takes about 6 ibuprofen daily for aches and pains for last couple of months.Since starting Plavix, he has had intermittent dark stools but no red blood per rectum.  In ED, initial blood pressure was 85/51 but this has improved with IV fluids to 147/74. His blood work showed hemoglobin of 8.2 creatinine 3.02 and white blood cell count 12.6.patient was given 2 units of PRBC transfusion since the admission. GI has seen him in consultation. EGD done 412 was essential normal. Colonoscopy showed polyps, diminutive and not removed since pt also had capsule endoscopy.  Assessment/Plan:    Principal Problem: Acute upper GI bleed / acute blood loss anemia - Likely related to ibuprofen, plavix use - Patient has received total of 2 units of PRBC transfusion since the admission - Ibuprofen and plavix held on admission - EGD 4/12 was unremarkable - Colonoscopy 4/13 showed diminutive polyps - Capsule endoscopy also done 4/13 - Appreciate very much GI following  - Continue PPI therapy   Active Problems: Acute renal failure superimposed on chronic kidney disease stage III -Please note GFR about one month ago was 42 and creatinine was 1.63. On this admission GFR is 20 and creatinine is 3.02, likely this is related to ibuprofen use  - Cr at baseline 1.6  Controlled diabetes mellitus with diabetic nephropathy without long-term insulin use - Diet controlled   Dyslipidemia associated with type 2  diabetes mellitus - Resume statin therapy after diet resumed   Essential hypertension - Lisinopril on hold because of hypotension and renal insufficiency  - Hydralazine PRN for BP 140/90 or above   Leukocytosis - Likely reactive, no evidence of acute infectious process - Subsequently normalized   DVT Prophylaxis  - SCD's bilaterally    Code Status: Full.  Family Communication:  plan of care discussed with the patient and his wife at the bedside  Disposition Plan: ome once cleared by GI  IV access:  Peripheral IV  Procedures and diagnostic studies:    Dg Chest 2 View 10/14/2015 No active cardiopulmonary disease.  Ct Head Wo Contrast 10/14/2015  Minimal diffuse cortical atrophy. Mild chronic ischemic white matter disease. Old right cerebellar infarction. No acute intracranial abnormality seen.   EGD 10/15/2015  Colonoscopy 10/16/2015  Medical Consultants:  Gastroenterology, Dr. Stacey Drain   Other Consultants:  None   IAnti-Infectives:   None    [name], MD  [facility] Pager [phone]  Time spent in minutes: 25 minutes  If 7PM-7AM, please contact night-coverage [email or web address] Password [facility] 10/16/2015, 4:19 PM   LOS: 2 days    HPI/Subjective: No acute overnight events. Patient reports he is tired.  Objective: Filed Vitals:   10/16/15 0905 10/16/15 0915 10/16/15 0924 10/16/15 1524  BP: 180/94 135/116  151/81  Pulse: 93 93  89  Temp:    98.3 F (36.8 C)  TempSrc:      Resp: '14 18  18  '$ Height:   '5\' 4"'$  (1.626 m)   Weight:   79.379 kg (175 lb)   SpO2:  96% 100%  98%    Intake/Output Summary (Last 24 hours) at 10/16/15 1619 Last data filed at 10/16/15 1252  Gross per 24 hour  Intake   3860 ml  Output    500 ml  Net   3360 ml    Exam:   General:  Pt is alert, not in acute distress  Cardiovascular: Ratecontrolled , S1/S2 (+)  Respiratory: No wheezing, no crackles, no rhonchi  Abdomen: (+) BS, non tender, non distended  Extremities:  No swelling, palpable pulses   Neuro: Nonfocal  Data Reviewed: Basic Metabolic Panel:  Recent Labs Lab 10/14/15 0650 10/14/15 0722 10/15/15 0839  NA 142 145 145  K 4.4 4.4 4.7  CL 109 107 113*  CO2 21*  --  22  GLUCOSE 164* 154* 87  BUN 36* 35* 19  CREATININE 3.02* 2.80* 1.62*  CALCIUM 8.7*  --  8.4*   Liver Function Tests:  Recent Labs Lab 10/14/15 0650  AST 17  ALT 15*  ALKPHOS 103  BILITOT 0.5  PROT 5.9*  ALBUMIN 3.3*   No results for input(s): LIPASE, AMYLASE in the last 168 hours. No results for input(s): AMMONIA in the last 168 hours. CBC:  Recent Labs Lab 10/14/15 0650 10/14/15 0722 10/14/15 1825 10/15/15 0839  WBC 12.6*  --  9.7 7.4  NEUTROABS 10.8*  --   --   --   HGB 7.3* 8.2* 8.9* 9.7*  HCT 23.5* 24.0* 27.7* 29.3*  MCV 90.7  --  89.1 89.6  PLT 278  --  233 247   Cardiac Enzymes: No results for input(s): CKTOTAL, CKMB, CKMBINDEX, TROPONINI in the last 168 hours. BNP: Invalid input(s): POCBNP CBG:  Recent Labs Lab 10/15/15 0728 10/15/15 1159 10/15/15 1651 10/15/15 2114 10/16/15 1149  GLUCAP 75 72 81 75 71    Recent Results (from the past 240 hour(s))  Blood culture (routine x 2)     Status: None (Preliminary result)   Collection Time: 10/14/15  8:36 AM  Result Value Ref Range Status   Specimen Description BLOOD LEFT ANTECUBITAL  Final   Special Requests BOTTLES DRAWN AEROBIC AND ANAEROBIC 5CC  Final   Culture NO GROWTH 2 DAYS  Final   Report Status PENDING  Incomplete  Urine culture     Status: Abnormal   Collection Time: 10/14/15  8:37 AM  Result Value Ref Range Status   Specimen Description URINE, CLEAN CATCH  Final   Special Requests NONE  Final   Culture 1,000 COLONIES/mL INSIGNIFICANT GROWTH (A)  Final   Report Status 10/15/2015 FINAL  Final  Blood culture (routine x 2)     Status: None (Preliminary result)   Collection Time: 10/14/15  8:43 AM  Result Value Ref Range Status   Specimen Description BLOOD LEFT HAND  Final    Special Requests BOTTLES DRAWN AEROBIC AND ANAEROBIC 5CC  Final   Culture NO GROWTH 2 DAYS  Final   Report Status PENDING  Incomplete     Scheduled Meds: . gabapentin  300 mg Oral TID  . insulin aspart  0-5 Units Subcutaneous QHS  . insulin aspart  0-9 Units Subcutaneous TID WC  . pantoprazole  40 mg Oral Q0600   Continuous Infusions:

## 2015-10-16 NOTE — Interval H&P Note (Signed)
History and Physical Interval Note:  Update - patient had EGD yesterday which was normal, no source of bleeding. Given iron deficiency anemia and need to be on plavix, will proceed with colonoscopy today. If this is negative, will hope to place capsule endoscopy to clear the small bowel. Discussed with patient who agreed following discussion of risks /benefits.    10/16/2015 7:48 AM  Tod Persia  has presented today for surgery, with the diagnosis of anemia, heme +, iron deficient  The various methods of treatment have been discussed with the patient and family. After consideration of risks, benefits and other options for treatment, the patient has consented to  Procedure(s): COLONOSCOPY (N/A) as a surgical intervention .  The patient's history has been reviewed, patient examined, no change in status, stable for surgery.  I have reviewed the patient's chart and labs.  Questions were answered to the patient's satisfaction.     Renelda Loma Kwadwo Taras

## 2015-10-16 NOTE — Care Management Obs Status (Signed)
[place] NOTIFICATION   Patient Details  Name: RANDELL TEARE MRN: [id number] Date of Birth: [birth year]   Medicare Observation Status Notification Given:  Yes  Reviewed CC 44 papers with patient  Carles Collet, RN 10/16/2015, 12:28 PM

## 2015-10-16 NOTE — Op Note (Signed)
[facility] Patient Name: Jason [name] Procedure Date : 10/16/2015 MRN: [id number] Attending MD: Carlota Raspberry. Sary Bogie MD, MD Date of Birth: 12-Oct-1945 CSN: [id number] Age: 71 Admit Type: Inpatient Procedure:                Colonoscopy Indications:              Iron deficiency anemia secondary to chronic blood                            loss, Iron deficiency anemia, patient on plavix                            with "dark stools". egd normal Providers:                Remo Lipps P. Randel Hargens MD, MD, Malka So, RN,                            Cletis Athens, Technician Referring MD:              Medicines:                Fentanyl 125 micrograms IV, Midazolam 10 mg IV,                            Diphenhydramine 50 mg IV Complications:            No immediate complications. Estimated blood loss:                            None. Estimated Blood Loss:     Estimated blood loss: none. Procedure:                Pre-Anesthesia Assessment:                           - Prior to the procedure, a History and Physical                            was performed, and patient medications and                            allergies were reviewed. The patient's tolerance of                            previous anesthesia was also reviewed. The risks                            and benefits of the procedure and the sedation                            options and risks were discussed with the patient.                            All questions were answered, and informed consent  was obtained. Prior Anticoagulants: The patient                            last took Plavix (clopidogrel) 3 days prior to the                            procedure. ASA Grade Assessment: III - A patient                            with severe systemic disease. After reviewing the                            risks and benefits, the patient was deemed in                            satisfactory condition  to undergo the procedure.                           After obtaining informed consent, the colonoscope                            was passed under direct vision. Throughout the                            procedure, the patient's blood pressure, pulse, and                            oxygen saturations were monitored continuously. The                            EC-[id number] ([id number]) scope was introduced through                            the anus and advanced to the the terminal ileum,                            with identification of the appendiceal orifice and                            IC valve. The colonoscopy was performed without                            difficulty. The patient tolerated the procedure                            poorly due to the patient's inability to tolerate                            conscious sedation. The quality of the bowel                            preparation was fair. The terminal ileum, ileocecal  valve, appendiceal orifice, and rectum were                            photographed. Scope In: 8:06:56 AM Scope Out: 8:31:04 AM Scope Withdrawal Time: 0 hours 17 minutes 30 seconds  Total Procedure Duration: 0 hours 24 minutes 8 seconds  Findings:      The perianal and digital rectal examinations were normal.      Scattered medium-mouthed diverticula were found in the entire colon.      A few benign appearing sessile polyps were found in the sigmoid colon       and transverse colon. The polyps were diminutive in size. These were not       removed given the patient will likely have capsule endoscopy in the near       future and do not want to confound this result.      The terminal ileum appeared normal.      The exam was otherwise normal throughout the examined colon. No blood or       pathology to cause bleeding was noted in the colon. Of note, due to a       narrow turn in the splenic flexure, a small area of erythema was noted        there on withdrawal without bleeding - in case this is seen on capsule       it was present on colonoscopy. The bowel prep was only fair in the right       colon but following lavage adequate views were obtained. The patient did       not sedate well with moderate sedation (due to narcotic use?). He had       some angulated turns in the left colon which he did not tolerate well.       Recommend future procedures be done with MAC. Impression:               - Preparation of the colon was fair.                           - Diverticulosis in the entire examined colon.                           - A few diminutive polyps in the sigmoid colon and                            in the transverse colon. These were not removed to                            avoid confounding capsule results.                           - The examined portion of the ileum was normal.                           - Overall no pathology noted to account for the                            patient's anemia or bleeding Moderate Sedation:      Moderate (conscious) sedation  was administered by the endoscopy nurse       and supervised by the endoscopist. The following parameters were       monitored: oxygen saturation, heart rate, blood pressure, and response       to care. Total physician intraservice time was 30 minutes. Recommendation:           - Return patient to hospital ward for ongoing care.                           - NPO for possible capsule placement while                            inpatient. We will discuss with the team if this is                            possible to be done today                           - Continue present medications, hold plavix until                            Capsule study is done                           - Pending timing of capsule (inpatient vs.                            outpatient), patient may be able to be discharged                            later today if he continues to not have any  further                            bleeding                           - Avoid all NSAIDs                           - Repeat colonoscopy as outpatient once this                            evaluation is complete to remove small polyps noted                            on this exam                           - Repeat colonoscopy for surveillance of multiple                            polyps. Procedure Code(s):        --- Professional ---                           [phone], Colonoscopy,  flexible; diagnostic, including                            collection of specimen(s) by brushing or washing,                            when performed (separate procedure)                           99152, Moderate sedation services provided by the                            same physician or other qualified health care                            professional performing the diagnostic or                            therapeutic service that the sedation supports,                            requiring the presence of an independent trained                            observer to assist in the monitoring of the                            patient's level of consciousness and physiological                            status; initial 15 minutes of intraservice time,                            patient age 46 years or older                           [id number], Moderate sedation services; each additional                            15 minutes intraservice time Diagnosis Code(s):        --- Professional ---                           D12.5, Benign neoplasm of sigmoid colon                           D12.3, Benign neoplasm of transverse colon (hepatic                            flexure or splenic flexure)                           D50.0, Iron deficiency anemia secondary to blood  loss (chronic)                           D50.9, Iron deficiency anemia, unspecified                           K57.30, Diverticulosis  of large intestine without                            perforation or abscess without bleeding CPT copyright 2016 [employer] Association. All rights reserved. The codes documented in this report are preliminary and upon coder review may  be revised to meet current compliance requirements. Remo Lipps P. Chad Donoghue MD, MD 10/16/2015 8:45:23 AM This report has been signed electronically. Number of Addenda: 0

## 2015-10-17 DIAGNOSIS — I10 Essential (primary) hypertension: Secondary | ICD-10-CM

## 2015-10-17 DIAGNOSIS — D62 Acute posthemorrhagic anemia: Secondary | ICD-10-CM | POA: Diagnosis not present

## 2015-10-17 DIAGNOSIS — K922 Gastrointestinal hemorrhage, unspecified: Secondary | ICD-10-CM | POA: Diagnosis not present

## 2015-10-17 DIAGNOSIS — K921 Melena: Secondary | ICD-10-CM | POA: Diagnosis not present

## 2015-10-17 DIAGNOSIS — E785 Hyperlipidemia, unspecified: Secondary | ICD-10-CM | POA: Diagnosis not present

## 2015-10-17 LAB — GLUCOSE, CAPILLARY
GLUCOSE-CAPILLARY: 80 mg/dL (ref 65–99)
GLUCOSE-CAPILLARY: 100 mg/dL — AB (ref 65–99)

## 2015-10-17 LAB — HEMOGLOBIN AND HEMATOCRIT, BLOOD
HCT: 33 % — ABNORMAL LOW (ref 39.0–52.0)
Hemoglobin: 10.8 g/dL — ABNORMAL LOW (ref 13.0–17.0)

## 2015-10-17 MED ORDER — PANTOPRAZOLE SODIUM 40 MG PO TBEC: 40.0000 mg | DELAYED_RELEASE_TABLET | Freq: Every day | ORAL | Status: DC

## 2015-10-17 MED ORDER — FERROUS SULFATE 325 (65 FE) MG PO TABS: 325.0000 mg | ORAL_TABLET | Freq: Two times a day (BID) | ORAL | Status: DC

## 2015-10-17 NOTE — Progress Notes (Signed)
          Daily Rounding Note  10/17/2015, 9:49 AM  LOS: 3 days   SUBJECTIVE:       Brown stools  OBJECTIVE:         Vital signs in last 24 hours:    Temp:  [97.9 F (36.6 C)-98.3 F (36.8 C)] 97.9 F (36.6 C) (04/14 0556) Pulse Rate:  [76-89] 76 (04/14 0556) Resp:  [18] 18 (04/14 0556) BP: (151-173)/(77-81) 173/77 mmHg (04/14 0556) SpO2:  [95 %-98 %] 96 % (04/14 0556) Weight:  [78.7 kg (173 lb 8 oz)] 78.7 kg (173 lb 8 oz) (04/14 0556) Last BM Date: 10/17/15 Filed Weights   10/16/15 0627 10/16/15 0924 10/17/15 0556  Weight: 79.7 kg (175 lb 11.3 oz) 79.379 kg (175 lb) 78.7 kg (173 lb 8 oz)   General: pleasant, comfortable.  Looks well   Heart: RRR Chest: clear bil.  No sob Abdomen: soft, NT, active BS, ND  Extremities: no CCE Neuro/Psych:  Oriented x 3.  Moves all 4s.    Intake/Output from previous day: 04/13 0701 - 04/14 0700 In: 540 [P.O.:540] Out: 650 [Urine:650]  Intake/Output this shift:    Lab Results:  Recent Labs  10/14/15 1825 10/15/15 0839 10/17/15 0850  WBC 9.7 7.4  --   HGB 8.9* 9.7* 10.8*  HCT 27.7* 29.3* 33.0*  PLT 233 247  --    BMET  Recent Labs  10/15/15 0839  NA 145  K 4.7  CL 113*  CO2 22  GLUCOSE 87  BUN 19  CREATININE 1.62*  CALCIUM 8.4*   LFT No results for input(s): PROT, ALBUMIN, AST, ALT, ALKPHOS, BILITOT, BILIDIR, IBILI in the last 72 hours. PT/INR No results for input(s): LABPROT, INR in the last 72 hours. Hepatitis Panel No results for input(s): HEPBSAG, HCVAB, HEPAIGM, HEPBIGM in the last 72 hours.  Studies/Results: No results found.  ASSESMENT:   -IDA with acute drop (to 7.3, ferritin 15)  in Hgb from 2 weeks ago and now hemoccult positive stools. On Plavix and Ibuprofen 6 per day. previous unremarkable colonoscopy at [facility] ~ 2014.  Also suspect element of anemia of chronic disease.  S/p PRBC x 2.  Hgb improving daily.  4/12 EGD: normal 4/12 Colonoscopy:  Diverticulosis.  Diminutive polyps of transverse, sigmoid wer not removed.  4/12 capsule study: I just removed apparatus, study will not be read until next week.   -Recent CVA: Placed on Plavix about 5-6 weeks ago, now on hold.  -OXB:DZHGDJ related to acute blood loss/dehydration. Now improved from stage 4 to stage 3.  Baseline looks to be stage 3.     PLAN   *  Ok to restart Plavix and ASA and discharge today.  Continue once daily PPI per home routine and hx GERD  *  GI office, Dr [name] will call pt with results of the pill study and arrange follow up if indicated.   *  Upped po iron to BID but he may need parenteral iron in future.  Though not on home med list, pt says he was taking once daily po iron [facility].      *  Needs more frequent monitoring of Hgb, [facility].      Azucena Freed  10/17/2015, 9:49 AM Pager: [phone]

## 2015-10-17 NOTE — Discharge Instructions (Signed)

## 2015-10-17 NOTE — Progress Notes (Signed)
Tod Persia to be D/C'd Home per MD order.  Discussed with the patient and all questions fully answered.  VSS, Skin clean, dry and intact without evidence of skin break down, no evidence of skin tears noted. IV catheter discontinued intact. Site without signs and symptoms of complications.   An After Visit Summary was printed and given to the patient. Patient received prescription.  D/c education completed with patient/family including follow up instructions, medication list, d/c activities limitations if indicated, with other d/c instructions as indicated by MD - patient able to verbalize understanding, all questions fully answered.   Patient instructed to return to ED, call 911, or call MD for any changes in condition.   Patient to be  escorted via [place], and D/C home via private auto.  Jerry Caras 10/17/2015 11:53 AM

## 2015-10-17 NOTE — Discharge Summary (Addendum)
Physician Discharge Summary  Jason [name] WCH:[id number] DOB: Oct 02, 1945 DOA: 10/14/2015  PCP: Verline Lema, MD  Admit date: 10/14/2015 Discharge date: 10/17/2015  Recommendations for Outpatient Follow-up:  Resume plavix Continue protonix 40 mg daily Continue ferrous sulfate twice a day  Discharge Diagnoses:  Principal Problem:   Anemia Active Problems:   Bipolar 1 disorder ([place])   HTN (hypertension)   AKI (acute kidney injury) ([place])   HLD (hyperlipidemia)   Carotid artery stenosis   Acid reflux disease with ulcer   Hyperglycemia   Upper GI bleeding   Melena    Discharge Condition: stable   Diet recommendation: as tolerated   History of present illness:  70 y.o. male with a past medical history significant for hypertension, recent stroke, on Plavix, further history of bipolar disorder. Patient presented to [facility] with generalized weakness, shortness of breath and dizziness for past week or so prior to this admission. Patient apparently takes about 6 ibuprofen daily for aches and pains for last couple of months.Since starting Plavix, he has had intermittent dark stools but no red blood per rectum.  In ED, initial blood pressure was 85/51 but this has improved with IV fluids to 147/74. His blood work showed hemoglobin of 8.2 creatinine 3.02 and white blood cell count 12.6.patient was given 2 units of PRBC transfusion since the admission. GI has seen him in consultation. EGD done 412 was essential normal. Colonoscopy showed polyps, diminutive and not removed since pt also had capsule endoscopy.  Hospital Course:   Assessment/Plan:    Principal Problem: Acute upper GI bleed / acute blood loss anemia - Likely related to ibuprofen, plavix use - Patient has received total of 2 units of PRBC transfusion since the admission - Ibuprofen and plavix held on admission - EGD 4/12 was unremarkable - Colonoscopy 4/13 showed diminutive polyps - Capsule endoscopy  also done 4/13, results pending - Per GI, okay to resume plavix - Continue PPI daily and ferrous sulfate BID - Appreciate very much GI following   Active Problems: Acute renal failure superimposed on chronic kidney disease stage III -Please note GFR about one month ago was 42 and creatinine was 1.63. On this admission GFR is 20 and creatinine is 3.02, likely this is related to ibuprofen use  - Cr at baseline 1.6  Controlled diabetes mellitus with diabetic nephropathy without long-term insulin use - Diet controlled   Dyslipidemia associated with type 2 diabetes mellitus - Resume statin therapy on discharge   Essential hypertension - May resume BP meds on discharge, cr at baseline as noted above, 1.6  Leukocytosis - Likely reactive, no evidence of acute infectious process - Subsequently normalized   DVT Prophylaxis  - SCD's bilaterally    Code Status: Full.  Family Communication: plan of care discussed with the patient and his wife at the bedside    IV access:  Peripheral IV  Procedures and diagnostic studies:   Dg Chest 2 View 10/14/2015 No active cardiopulmonary disease.  Ct Head Wo Contrast 10/14/2015 Minimal diffuse cortical atrophy. Mild chronic ischemic white matter disease. Old right cerebellar infarction. No acute intracranial abnormality seen.   EGD 10/15/2015  Colonoscopy 10/16/2015  Capsule endoscopy 4/13  Medical Consultants:  Gastroenterology, Dr. Stacey Drain   Other Consultants:  None   IAnti-Infectives:   None    Signed:  [name], MD  [facility] 10/17/2015, 11:17 AM  Pager #: [phone]  Time spent in minutes: less than 30 minutes    Discharge Exam: Filed  Vitals:   10/16/15 2218 10/17/15 0556  BP: 161/78 173/77  Pulse: 79 76  Temp: 98.2 F (36.8 C) 97.9 F (36.6 C)  Resp: 18 18   Filed Vitals:   10/16/15 0924 10/16/15 1524 10/16/15 2218 10/17/15 0556  BP:  151/81 161/78 173/77  Pulse:  89  79 76  Temp:  98.3 F (36.8 C) 98.2 F (36.8 C) 97.9 F (36.6 C)  TempSrc:   Oral Oral  Resp:  '18 18 18  '$ Height: '5\' 4"'$  (1.626 m)     Weight: 79.379 kg (175 lb)   78.7 kg (173 lb 8 oz)  SpO2:  98% 95% 96%    General: Pt is alert, follows commands appropriately, not in acute distress Cardiovascular: Regular rate and rhythm, S1/S2 +, no murmurs Respiratory: Clear to auscultation bilaterally, no wheezing, no crackles, no rhonchi Abdominal: Soft, non tender, non distended, bowel sounds +, no guarding Extremities: no edema, no cyanosis, pulses palpable bilaterally DP and PT Neuro: Grossly nonfocal  Discharge Instructions  Discharge Instructions    Call MD for:  difficulty breathing, headache or visual disturbances    Complete by:  As directed      Call MD for:  persistant dizziness or light-headedness    Complete by:  As directed      Call MD for:  persistant nausea and vomiting    Complete by:  As directed      Call MD for:  severe uncontrolled pain    Complete by:  As directed      Diet - low sodium heart healthy    Complete by:  As directed      Discharge instructions    Complete by:  As directed   Resume plavix Continue protonix 40 mg daily Continue ferrous sulfate twice a day     Increase activity slowly    Complete by:  As directed             Medication List    STOP taking these medications        aspirin EC 81 MG tablet     omeprazole 20 MG capsule  Commonly known as:  PRILOSEC      TAKE these medications        atorvastatin 40 MG tablet  Commonly known as:  LIPITOR  Take 1 tablet (40 mg total) by mouth daily at 6 PM.     clopidogrel 75 MG tablet  Commonly known as:  PLAVIX  Take 1 tablet (75 mg total) by mouth daily.     ferrous sulfate 325 (65 FE) MG tablet  Take 1 tablet (325 mg total) by mouth 2 (two) times daily with a meal.     Fish Oil 1000 MG Caps  Take 2,000 mg by mouth daily.     FLUoxetine 20 MG capsule  Commonly known as:  PROZAC   Take 60 mg by mouth daily.     gabapentin 300 MG capsule  Commonly known as:  NEURONTIN  Take 300 mg by mouth 3 (three) times daily.     lisinopril 20 MG tablet  Commonly known as:  PRINIVIL,ZESTRIL  Take 40 mg by mouth daily.     mirtazapine 45 MG tablet  Commonly known as:  REMERON  Take 45 mg by mouth at bedtime.     oxyCODONE-acetaminophen 5-325 MG tablet  Commonly known as:  PERCOCET/ROXICET  Take 1 tablet by mouth every 4 (four) hours as needed for moderate pain or severe pain.  pantoprazole 40 MG tablet  Commonly known as:  PROTONIX  Take 1 tablet (40 mg total) by mouth daily at 6 (six) AM.     prazosin 1 MG capsule  Commonly known as:  MINIPRESS  Take 3 mg by mouth at bedtime.           Follow-up Information    Follow up with [name], MD.   Specialty:  Gastroenterology   Why:  office will call with results of pill camera study. if you do not hear from office but 20th, please call us.    Contact information:   [place] [address] [phone]       Follow up with PERRY, ANGELA L, MD. Schedule an appointment as soon as possible for a visit in 2 weeks.   Specialty:  Internal Medicine   Why:  If symptoms worsen, Follow up appt after recent hospitalization   Contact information:   [place] [address] [phone]        The results of significant diagnostics from this hospitalization (including imaging, microbiology, ancillary and laboratory) are listed below for reference.    Significant Diagnostic Studies: Dg Chest 2 View  10/14/2015  CLINICAL DATA:  Weakness. EXAM: CHEST  2 VIEW COMPARISON:  August 30, 2015. FINDINGS: The heart size and mediastinal contours are within normal limits. Both lungs are clear. No pneumothorax or pleural effusion is noted. The visualized skeletal structures are unremarkable. IMPRESSION: No active cardiopulmonary disease. Electronically Signed   By: Marijo Conception,  M.D.   On: 10/14/2015 09:26   Ct Head Wo Contrast  10/14/2015  CLINICAL DATA:  Acute headache, dizziness. EXAM: CT HEAD WITHOUT CONTRAST TECHNIQUE: Contiguous axial images were obtained from the base of the skull through the vertex without intravenous contrast. COMPARISON:  MRI of August 30, 2015.  CT scan of April 30, 2014. FINDINGS: Bony calvarium appears intact. Minimal diffuse cortical atrophy is noted. Mild chronic ischemic white matter disease is noted. Old right inferior cerebellar infarction is noted. No mass effect or midline shift is noted. Ventricular size is within normal limits. There is no evidence of mass lesion, hemorrhage or acute infarction. IMPRESSION: Minimal diffuse cortical atrophy. Mild chronic ischemic white matter disease. Old right cerebellar infarction. No acute intracranial abnormality seen. Electronically Signed   By: Marijo Conception, M.D.   On: 10/14/2015 09:42    Microbiology: Recent Results (from the past 240 hour(s))  Blood culture (routine x 2)     Status: None (Preliminary result)   Collection Time: 10/14/15  8:36 AM  Result Value Ref Range Status   Specimen Description BLOOD LEFT ANTECUBITAL  Final   Special Requests BOTTLES DRAWN AEROBIC AND ANAEROBIC 5CC  Final   Culture NO GROWTH 2 DAYS  Final   Report Status PENDING  Incomplete  Urine culture     Status: Abnormal   Collection Time: 10/14/15  8:37 AM  Result Value Ref Range Status   Specimen Description URINE, CLEAN CATCH  Final   Special Requests NONE  Final   Culture 1,000 COLONIES/mL INSIGNIFICANT GROWTH (A)  Final   Report Status 10/15/2015 FINAL  Final  Blood culture (routine x 2)     Status: None (Preliminary result)   Collection Time: 10/14/15  8:43 AM  Result Value Ref Range Status   Specimen Description BLOOD LEFT HAND  Final   Special Requests BOTTLES DRAWN AEROBIC AND ANAEROBIC 5CC  Final   Culture NO GROWTH  2 DAYS  Final   Report Status PENDING  Incomplete     Labs: Basic  Metabolic Panel:  Recent Labs Lab 10/14/15 0650 10/14/15 0722 10/15/15 0839  NA 142 145 145  K 4.4 4.4 4.7  CL 109 107 113*  CO2 21*  --  22  GLUCOSE 164* 154* 87  BUN 36* 35* 19  CREATININE 3.02* 2.80* 1.62*  CALCIUM 8.7*  --  8.4*   Liver Function Tests:  Recent Labs Lab 10/14/15 0650  AST 17  ALT 15*  ALKPHOS 103  BILITOT 0.5  PROT 5.9*  ALBUMIN 3.3*   No results for input(s): LIPASE, AMYLASE in the last 168 hours. No results for input(s): AMMONIA in the last 168 hours. CBC:  Recent Labs Lab 10/14/15 0650 10/14/15 0722 10/14/15 1825 10/15/15 0839 10/17/15 0850  WBC 12.6*  --  9.7 7.4  --   NEUTROABS 10.8*  --   --   --   --   HGB 7.3* 8.2* 8.9* 9.7* 10.8*  HCT 23.5* 24.0* 27.7* 29.3* 33.0*  MCV 90.7  --  89.1 89.6  --   PLT 278  --  233 247  --    Cardiac Enzymes: No results for input(s): CKTOTAL, CKMB, CKMBINDEX, TROPONINI in the last 168 hours. BNP: BNP (last 3 results) No results for input(s): BNP in the last 8760 hours.  ProBNP (last 3 results) No results for input(s): PROBNP in the last 8760 hours.  CBG:  Recent Labs Lab 10/15/15 2114 10/16/15 1149 10/16/15 1718 10/16/15 2216 10/17/15 0815  GLUCAP 75 71 94 94 80

## 2015-10-19 LAB — CULTURE, BLOOD (ROUTINE X 2)
CULTURE: NO GROWTH
Culture: NO GROWTH

## 2015-10-20 ENCOUNTER — Telehealth: Payer: Self-pay | Admitting: Gastroenterology

## 2015-10-20 DIAGNOSIS — K922 Gastrointestinal hemorrhage, unspecified: Secondary | ICD-10-CM

## 2015-10-20 NOTE — Telephone Encounter (Signed)
Jason [name] this patient was admitted for GI bleeding over last week. EGD and colonoscopy without clear etiology. Capsule was done to clear the small bowel while inpatient and it timed out, it is an incomplete study. I am sorry to see this, it was a failure of the equipment. Can you try to coordinate a capsule for him to be done ASAP here at our clinic? His plavix is being held due to his bleeding. If he can get it done in the next few days I will try to read it before the weekend. He will need to bowel prep again. Can you let me know? I would also like to repeat a CBC when he comes into the office to ensure stable. Thanks much

## 2015-10-21 ENCOUNTER — Other Ambulatory Visit (INDEPENDENT_AMBULATORY_CARE_PROVIDER_SITE_OTHER): Payer: Medicare Other

## 2015-10-21 ENCOUNTER — Encounter (HOSPITAL_COMMUNITY): Payer: Self-pay | Admitting: Gastroenterology

## 2015-10-21 ENCOUNTER — Other Ambulatory Visit: Payer: Self-pay | Admitting: *Deleted

## 2015-10-21 ENCOUNTER — Encounter: Payer: Self-pay | Admitting: *Deleted

## 2015-10-21 DIAGNOSIS — K922 Gastrointestinal hemorrhage, unspecified: Secondary | ICD-10-CM

## 2015-10-21 LAB — CBC WITH DIFFERENTIAL/PLATELET
BASOS PCT: 0.5 % (ref 0.0–3.0)
Basophils Absolute: 0 10*3/uL (ref 0.0–0.1)
EOS ABS: 0.1 10*3/uL (ref 0.0–0.7)
Eosinophils Relative: 1.9 % (ref 0.0–5.0)
HCT: 32 % — ABNORMAL LOW (ref 39.0–52.0)
HEMOGLOBIN: 10.4 g/dL — AB (ref 13.0–17.0)
Lymphocytes Relative: 20.9 % (ref 12.0–46.0)
Lymphs Abs: 1.5 10*3/uL (ref 0.7–4.0)
MCHC: 32.6 g/dL (ref 30.0–36.0)
MCV: 89.1 fl (ref 78.0–100.0)
MONO ABS: 0.5 10*3/uL (ref 0.1–1.0)
Monocytes Relative: 6.3 % (ref 3.0–12.0)
Neutro Abs: 5 10*3/uL (ref 1.4–7.7)
Neutrophils Relative %: 70.4 % (ref 43.0–77.0)
Platelets: 316 10*3/uL (ref 150.0–400.0)
RBC: 3.59 Mil/uL — ABNORMAL LOW (ref 4.22–5.81)
RDW: 15.3 % (ref 11.5–15.5)
WBC: 7.2 10*3/uL (ref 4.0–10.5)

## 2015-10-21 NOTE — Telephone Encounter (Signed)
Spoke with patient and gave him recommendations. He wants to talk his doctor at the [place] first. He states he might need to go there for SBCE since it would be free there. He will call back. He will come for lab.

## 2015-10-21 NOTE — Telephone Encounter (Signed)
Patient scheduled for SBCE on 10/27/15. Per MD does not need to hold Plavix.

## 2015-10-21 NOTE — Telephone Encounter (Signed)
Patient given verbal and written instructions for SBCE.  Verbalizes understanding.

## 2015-10-21 NOTE — Telephone Encounter (Signed)
Jason [name] I called the patient and went over the results of his capsule which failed. He tried calling the [place] and can't see his PCM until the end of May. I explained to him the urgency for this capsule to be done so we can clarify what caused his bleeding and risk stratify him if he resumes his plavix. He reports he has already resumed plavix upon discharge and is having blood work this afternoon to check CBC to ensure stable. I will await this result. He denies any rectal bleeding at this time. Following our discussion he wished to proceed with capsule given it will be extremely delayed to get it done at the [place]. Jason [name] he will come to the clinic for blood work today and will come to see you for scheduling. Can you please schedule him for the capsule and I will discuss with you whether or not he should hold plavix for the exam. Thanks

## 2015-10-21 NOTE — OR Nursing (Signed)
Endo: charge for SB capsule and study removed due to failure of equipment.

## 2015-10-21 NOTE — Telephone Encounter (Signed)
Left a message for patient to call back. Lab in EPIC. 

## 2015-10-22 ENCOUNTER — Encounter: Payer: Self-pay | Admitting: Gastroenterology

## 2015-10-22 NOTE — Telephone Encounter (Signed)
Error

## 2015-10-25 ENCOUNTER — Telehealth: Payer: Self-pay | Admitting: Internal Medicine

## 2015-10-25 NOTE — Telephone Encounter (Signed)
Paged - called back but got voice mail - advised to call back vs go to ED if severely ill  Info I received was that BP high and felt cold

## 2015-10-27 ENCOUNTER — Telehealth: Payer: Self-pay | Admitting: *Deleted

## 2015-10-27 ENCOUNTER — Other Ambulatory Visit: Payer: Self-pay | Admitting: *Deleted

## 2015-10-27 DIAGNOSIS — D509 Iron deficiency anemia, unspecified: Secondary | ICD-10-CM

## 2015-10-27 NOTE — Telephone Encounter (Signed)
-----   Message from [name], MD sent at 10/26/2015 11:28 AM EDT ----- Thanks for letting me know. Could you book him a follow up with me then in the clinic, insurance permitting? He should have a repeat CBC in 1-2 weeks. Thanks  ----- Message -----    From: [name], RN    Sent: 10/24/2015   8:27 AM      To: [name], MD  Dr. [name], Just wanted to let you know I noticed Mr. Godlewski cancelled his SBCE that was scheduled on 10/27/15 because he is dealing with insurance issues.  Rollene Fare

## 2015-10-27 NOTE — Telephone Encounter (Signed)
Lab in EPIC. Left a message for patient to call back. 

## 2015-10-27 NOTE — Telephone Encounter (Signed)
-----   Message from [name], MD sent at 10/26/2015 11:28 AM EDT ----- Thanks for letting me know. Could you book him a follow up with me then in the clinic, insurance permitting? He should have a repeat CBC in 1-2 weeks. Thanks  ----- Message -----    From: [name], RN    Sent: 10/24/2015   8:27 AM      To: [name], MD  Dr. [name], Just wanted to let you know I noticed Mr. Sciuto cancelled his SBCE that was scheduled on 10/27/15 because he is dealing with insurance issues.  Rollene Fare

## 2015-10-27 NOTE — Telephone Encounter (Signed)
Left a message for patient to call back. Lab in EPIC. 

## 2015-10-28 NOTE — Telephone Encounter (Signed)
Spoke with patient and he will come for labs. Scheduled OV on 11/27/15 at 9:30 AM with Dr. [name]. Patient states he had hot and cold chills several days ago.He thinks the Plavix caused it so he stopped it and is taking aspirin daily. Patient instructed to call his PCP and discuss this with them. He states he will do this.

## 2015-10-28 NOTE — Telephone Encounter (Signed)
Thanks [employer]. I don't think the plavix is causing his symptoms, in my opinion he should resume it, but should see his PCP for this. I will await CBC, and office visit. If insurance allows and he wants to do the capsule in the interim we can do it. Thanks

## 2015-11-04 ENCOUNTER — Other Ambulatory Visit (INDEPENDENT_AMBULATORY_CARE_PROVIDER_SITE_OTHER): Payer: Medicare Other

## 2015-11-04 DIAGNOSIS — D509 Iron deficiency anemia, unspecified: Secondary | ICD-10-CM | POA: Diagnosis not present

## 2015-11-04 LAB — CBC WITH DIFFERENTIAL/PLATELET
BASOS ABS: 0 10*3/uL (ref 0.0–0.1)
BASOS PCT: 0.4 % (ref 0.0–3.0)
Eosinophils Absolute: 0.2 10*3/uL (ref 0.0–0.7)
Eosinophils Relative: 1.8 % (ref 0.0–5.0)
HEMATOCRIT: 30.2 % — AB (ref 39.0–52.0)
Hemoglobin: 9.9 g/dL — ABNORMAL LOW (ref 13.0–17.0)
LYMPHS ABS: 2.2 10*3/uL (ref 0.7–4.0)
LYMPHS PCT: 21.1 % (ref 12.0–46.0)
MCHC: 32.8 g/dL (ref 30.0–36.0)
MCV: 87.7 fl (ref 78.0–100.0)
MONOS PCT: 6.1 % (ref 3.0–12.0)
Monocytes Absolute: 0.6 10*3/uL (ref 0.1–1.0)
NEUTROS ABS: 7.2 10*3/uL (ref 1.4–7.7)
NEUTROS PCT: 70.6 % (ref 43.0–77.0)
PLATELETS: 413 10*3/uL — AB (ref 150.0–400.0)
RBC: 3.45 Mil/uL — ABNORMAL LOW (ref 4.22–5.81)
RDW: 15.1 % (ref 11.5–15.5)
WBC: 10.2 10*3/uL (ref 4.0–10.5)

## 2015-11-05 ENCOUNTER — Encounter: Payer: Self-pay | Admitting: *Deleted

## 2015-11-05 ENCOUNTER — Other Ambulatory Visit: Payer: Self-pay | Admitting: *Deleted

## 2015-11-05 DIAGNOSIS — D509 Iron deficiency anemia, unspecified: Secondary | ICD-10-CM

## 2015-11-05 NOTE — Telephone Encounter (Signed)
error 

## 2015-11-13 ENCOUNTER — Telehealth: Payer: Self-pay | Admitting: Gastroenterology

## 2015-11-13 NOTE — Telephone Encounter (Signed)
Spoke with patient and he is at the [place] currently. He will call back with a contact if he can get one.

## 2015-11-13 NOTE — Telephone Encounter (Signed)
Is there anyone or point of contact I can call to get approval from the [place]? He needs this exam done, unless the [place] can do it for him. Thanks

## 2015-11-13 NOTE — Telephone Encounter (Signed)
Patient called and cancelled his capsule that was scheduled tomorrow because he has not been able to get it approved by [place].

## 2015-11-14 NOTE — Telephone Encounter (Signed)
Spoke with patient and he states he finally got to speak with Dr. Henrene Pastor at the [facility] and the PA is being worked on today. He hopes to have it this afternoon.

## 2015-11-17 ENCOUNTER — Telehealth: Payer: Self-pay | Admitting: *Deleted

## 2015-11-17 NOTE — Telephone Encounter (Signed)
Scheduled SBCE on 11/21/15 at 8:00 AM. Patient will come for instructions tomorrow.

## 2015-11-18 ENCOUNTER — Encounter: Payer: Self-pay | Admitting: *Deleted

## 2015-11-18 ENCOUNTER — Other Ambulatory Visit: Payer: Self-pay | Admitting: *Deleted

## 2015-11-18 DIAGNOSIS — D509 Iron deficiency anemia, unspecified: Secondary | ICD-10-CM

## 2015-11-21 ENCOUNTER — Ambulatory Visit (INDEPENDENT_AMBULATORY_CARE_PROVIDER_SITE_OTHER): Payer: Medicare Other | Admitting: Gastroenterology

## 2015-11-21 DIAGNOSIS — D62 Acute posthemorrhagic anemia: Secondary | ICD-10-CM | POA: Diagnosis not present

## 2015-11-21 NOTE — Progress Notes (Signed)
Patient here for capsule endoscopy. Patient completed the prep and has not eaten. Capsule swallowed without difficulty. Tolerated procedure. Verbalizes understanding of written and verbal instructions.Capsule lot- [id number] Expires 2017-02-01

## 2015-11-26 ENCOUNTER — Telehealth: Payer: Self-pay | Admitting: Gastroenterology

## 2015-11-26 NOTE — Telephone Encounter (Signed)
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 He had one red spot in his stomach which I think is an incidental finding and not likely causing anemia, was not noted on prior EGD during the time of severe anemia. 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 Small polyp which appeared benign and no stigmata of bleeding.   At this time I would like to repeat his CBC and iron studies (including ferritin), about a month from his last draw, so roughly first week of June. He should still be taking iron. hopefully his counts normalize on iron. Can you please clarify if he is taking plavix as well or if he has stopped?   Finally, he needs to have a follow up colonoscopy scheduled in few months to remove small polyps noted on his initial exam. Polyps were not removed given his symptoms of bleeding to not confound his course and capsule results at the time. I would like him to wait a few months prior to doing this given his stroke in February, perhaps in July or so. I will await his repeat blood work  thanks

## 2015-11-27 ENCOUNTER — Encounter: Payer: Self-pay | Admitting: Gastroenterology

## 2015-11-27 ENCOUNTER — Ambulatory Visit (INDEPENDENT_AMBULATORY_CARE_PROVIDER_SITE_OTHER): Payer: Medicare Other | Admitting: Gastroenterology

## 2015-11-27 VITALS — BP 128/72 | HR 92 | Ht 65.0 in | Wt 180.0 lb

## 2015-11-27 DIAGNOSIS — D509 Iron deficiency anemia, unspecified: Secondary | ICD-10-CM

## 2015-11-27 DIAGNOSIS — K558 Other vascular disorders of intestine: Secondary | ICD-10-CM | POA: Diagnosis not present

## 2015-11-27 DIAGNOSIS — Z86010 Personal history of colonic polyps: Secondary | ICD-10-CM | POA: Diagnosis not present

## 2015-11-27 DIAGNOSIS — K5520 Angiodysplasia of colon without hemorrhage: Secondary | ICD-10-CM

## 2015-11-27 NOTE — Progress Notes (Signed)
HPI :  70 y/o male with history of CVA this past February and started on Plavix, who developed GI bleed and anemia leading to hospitalization in early April. He had endorsed significant NSAID use in addition to Plavix. EGD was unremarkable, colonoscopy showed a few small polyps (not removed), but no etiology for his anemia. He had a capsule study while inpatient but the capsule timed out and the study was incomplete. He repeated it a few days ago.  He reports passing dark brown stools. No black stools. He is taking iron pills, once daily. He has been on iron for a few months. He has some intermittent cramps, but nothing significant. No weight loss. He is eating okay, some decreased appetite over the past year. He has been off plavix - he thinks it caused other symptoms chills / sweats when he takes it. He stopped the medication and it went away. When he resumed it after the hospitalization these symptoms came back and he stopped it again and it resolved. He has spoken with his [facility] primary care physician and given his reported intolerance to plavix he has stopped it and now on aspirin. He is taking '325mg'$  aspirin daily for this history of CVA. His stroke was in February, no recurrence since that time. 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 Hypertension   . Stroke ([place])   . Prostate cancer ([place])   . Depression   . Bipolar 1 disorder ([place])   . Acid reflux disease with ulcer   . Anemia   . AKI (acute kidney injury) ([place])   . PTSD (post-traumatic stress disorder)   . Hyperlipidemia   . Chronic back pain   . Hearing loss   . GERD (gastroesophageal reflux  disease)   . Carotid stenosis      Past Surgical History  Procedure Laterality Date  . Prostate surgery    . Back surgery    . Esophagogastroduodenoscopy N/A 10/15/2015    Procedure: ESOPHAGOGASTRODUODENOSCOPY (EGD);  Surgeon: [name], MD;  Location: [facility];  Service: Gastroenterology;  Laterality: N/A;  . Colonoscopy N/A 10/16/2015    Procedure: COLONOSCOPY;  Surgeon: [name], MD;  Location: [facility];  Service: Gastroenterology;  Laterality: N/A;  . Givens capsule study N/A 10/16/2015    Procedure: GIVENS CAPSULE STUDY;  Surgeon: [name], MD;  Location: [facility];  Service: Gastroenterology;  Laterality: N/A;   Family History  Problem Relation Age of Onset  . Stroke Mother     Hemorrhagic stroke  . Cancer Father     Pancreatic cancer  . Hypertension Father    Social History  Substance Use Topics  . Smoking status: Former Smoker    Quit date: 03/06/2003  . Smokeless tobacco: Never Used  . Alcohol Use: No   Current Outpatient Prescriptions  Medication Sig Dispense Refill  . atorvastatin (LIPITOR) 40 MG tablet Take 1 tablet (40 mg total) by mouth daily at 6 PM. 30 tablet 0  . ferrous sulfate 325 (65 FE) MG tablet Take 1 tablet (325 mg total) by mouth 2 (two) times daily with a meal. 60 tablet 0  . gabapentin (  NEURONTIN) 300 MG capsule Take 300 mg by mouth 3 (three) times daily.     Marland Kitchen lisinopril (PRINIVIL,ZESTRIL) 20 MG tablet Take 40 mg by mouth daily.    . mirtazapine (REMERON) 45 MG tablet Take 45 mg by mouth at bedtime.    . Omega-3 Fatty Acids (FISH OIL) 1000 MG CAPS Take 2,000 mg by mouth daily.    Marland Kitchen oxyCODONE-acetaminophen (PERCOCET/ROXICET) 5-325 MG tablet Take 1 tablet by mouth every 4 (four) hours as needed for moderate pain or severe pain. 30 tablet 0  . pantoprazole (PROTONIX) 40 MG tablet Take 1 tablet (40 mg total) by mouth daily at 6 (six) AM. 30 tablet 0  . prazosin (MINIPRESS) 1 MG capsule Take 3 mg by mouth  at bedtime.    . sertraline (ZOLOFT) 25 MG tablet Take 25 mg by mouth daily.    . clopidogrel (PLAVIX) 75 MG tablet Take 1 tablet (75 mg total) by mouth daily. (Patient not taking: Reported on 11/27/2015) 90 tablet 1   No current facility-administered medications for this visit.   Allergies  Allergen Reactions  . Penicillins     Unknown reaction Has patient had a PCN reaction causing immediate rash, facial/tongue/throat swelling, SOB or lightheadedness with hypotension: Unknown Has patient had a PCN reaction causing severe rash involving mucus membranes or skin necrosis: Unknown Has patient had a PCN reaction that required hospitalization: No Has patient had a PCN reaction occurring within the last 10 years: No If all of the above answers are "NO", then may proceed with Cephalosporin use.      Review of Systems: All systems reviewed and negative except where noted in HPI.   CBC Latest Ref Rng 11/04/2015 10/21/2015 10/17/2015  WBC 4.0 - 10.5 K/uL 10.2 7.2 -  Hemoglobin 13.0 - 17.0 g/dL 9.9(L) 10.4(L) 10.8(L)  Hematocrit 39.0 - 52.0 % 30.2(L) 32.0(L) 33.0(L)  Platelets 150.0 - 400.0 K/uL 413.0(H) 316.0 -    Lab Results  Component Value Date   ALT 15* 10/14/2015   AST 17 10/14/2015   ALKPHOS 103 10/14/2015   BILITOT 0.5 10/14/2015    Lab Results  Component Value Date   CREATININE 1.62* 10/15/2015   BUN 19 10/15/2015   NA 145 10/15/2015   K 4.7 10/15/2015   CL 113* 10/15/2015   CO2 22 10/15/2015    Lab Results  Component Value Date   IRON 30* 10/14/2015   TIBC 396 10/14/2015   FERRITIN 15* 10/14/2015      Physical Exam: BP 128/72 mmHg  Pulse 92  Ht '5\' 5"'$  (1.651 m)  Wt 180 lb (81.647 kg)  BMI 29.95 kg/m2 Constitutional: Pleasant,well-developed, male in no acute distress. HEENT: Normocephalic and atraumatic. Conjunctivae are normal. No scleral icterus. Neck supple.  Cardiovascular: Normal rate, regular rhythm.  Pulmonary/chest: Effort normal and breath sounds  normal. No wheezing, rales or rhonchi. Abdominal: Soft, nondistended, nontender. Bowel sounds active throughout. There are no masses palpable. No hepatomegaly. Extremities: no edema Lymphadenopathy: No cervical adenopathy noted. Neurological: Alert and oriented to person place and time. Skin: Skin is warm and dry. No rashes noted. Psychiatric: Normal mood and affect. Behavior is normal.   ASSESSMENT AND PLAN: 70 y/o male with history of CVA, previously on Plavix, now aspirin, admitted in early February with melena and iron deficiency anemia, leading to blood transfusion. EGD and colonoscopy without clear etiology. Capsule shows 2 small bowel AVMs which I suspect are the most likely etiology in the setting of Plavix use. He stopped Plavix but his  CBC on 5/2 did not show significant improvement in Hgb. He is passing brown stools at this time, no overt evidence of GI bleeding. Recommend he increase his ferrous sulfate to BID at this time and repeat CBC with iron studies in a few weeks. If his anemia improves / resolves, then he can continue iron as needed and I don't think we need to further evaluate this issue. If his anemia persists, and he remains iron deficient, we may need to consider IV iron supplementation. We may also consider balloon enteroscopy in this setting if thought to be causing his anemia, to treat small bowel AVMs, but hopefully we can avoid this if possible. He agreed with the plan. Otherwise, he is in need of a follow up colonoscopy to remove small polyps, they were not removed at the time of initial exam given his Plavix use and recent bleeding. [facility] will [facility] to see if this can be done at our facility or if it needs to be done at the [place]. He will let me know. All questions answered.   [place] Cellar, MD [facility]astroenterology Pager [phone]

## 2015-11-27 NOTE — Patient Instructions (Addendum)
   Please come in 2 weeks and get lab work done, the lab is open 7:30AM-5:30PM Monday- Friday, they close for lunch 1:30pm-2:00pm.    Increase your iron to twice daily.   It has been recommended to you by your physician that you have a(n) colonoscopy completed. Per your request, we did not schedule the procedure(s) today. Please contact our office at [phone] should you decide to have the procedure completed.  (Patient is a VET and going to check with the [place] to see about coverage for this)   I appreciate the opportunity to care for you.

## 2015-11-27 NOTE — Telephone Encounter (Signed)
Patient saw MD in office today.

## 2015-12-05 ENCOUNTER — Other Ambulatory Visit (INDEPENDENT_AMBULATORY_CARE_PROVIDER_SITE_OTHER): Payer: Medicare Other

## 2015-12-05 DIAGNOSIS — D509 Iron deficiency anemia, unspecified: Secondary | ICD-10-CM

## 2015-12-05 DIAGNOSIS — K5520 Angiodysplasia of colon without hemorrhage: Secondary | ICD-10-CM

## 2015-12-05 DIAGNOSIS — K558 Other vascular disorders of intestine: Secondary | ICD-10-CM

## 2015-12-05 DIAGNOSIS — K922 Gastrointestinal hemorrhage, unspecified: Secondary | ICD-10-CM

## 2015-12-05 LAB — CBC WITH DIFFERENTIAL/PLATELET
BASOS ABS: 0.1 10*3/uL (ref 0.0–0.1)
Basophils Relative: 0.8 % (ref 0.0–3.0)
Eosinophils Absolute: 0.2 10*3/uL (ref 0.0–0.7)
Eosinophils Relative: 2.3 % (ref 0.0–5.0)
HEMATOCRIT: 30.5 % — AB (ref 39.0–52.0)
Hemoglobin: 9.9 g/dL — ABNORMAL LOW (ref 13.0–17.0)
LYMPHS ABS: 1.8 10*3/uL (ref 0.7–4.0)
LYMPHS PCT: 23 % (ref 12.0–46.0)
MCHC: 32.4 g/dL (ref 30.0–36.0)
MCV: 90.8 fl (ref 78.0–100.0)
MONOS PCT: 5.9 % (ref 3.0–12.0)
Monocytes Absolute: 0.4 10*3/uL (ref 0.1–1.0)
NEUTROS PCT: 68 % (ref 43.0–77.0)
Neutro Abs: 5.2 10*3/uL (ref 1.4–7.7)
Platelets: 325 10*3/uL (ref 150.0–400.0)
RBC: 3.36 Mil/uL — AB (ref 4.22–5.81)
RDW: 16.3 % — ABNORMAL HIGH (ref 11.5–15.5)
WBC: 7.7 10*3/uL (ref 4.0–10.5)

## 2015-12-05 LAB — IBC PANEL
Iron: 28 ug/dL — ABNORMAL LOW (ref 42–165)
SATURATION RATIOS: 6.6 % — AB (ref 20.0–50.0)
TRANSFERRIN: 304 mg/dL (ref 212.0–360.0)

## 2015-12-05 LAB — FERRITIN: Ferritin: 19.3 ng/mL — ABNORMAL LOW (ref 22.0–322.0)

## 2015-12-08 ENCOUNTER — Other Ambulatory Visit: Payer: Self-pay | Admitting: *Deleted

## 2015-12-08 DIAGNOSIS — D509 Iron deficiency anemia, unspecified: Secondary | ICD-10-CM

## 2015-12-09 ENCOUNTER — Telehealth: Payer: Self-pay | Admitting: *Deleted

## 2015-12-09 NOTE — Telephone Encounter (Signed)
-----   Message from Darden Dates sent at 12/09/2015 10:43 AM EDT ----- HEY Lourdez Mcgahan, CALLED [facility]  ABOUT NEEDING VA AUTH FOR IRON INFUSION.  HE SAID ITS BECOME TO MUCH OF A HASSLE TO HAVE THESE AUTHS FROM THE [facility] AND HE SAID HE WANTED TO CANCEL AND SEE IF HE COULD HAVE DONE AT THE [facility]. THANKS AMY

## 2015-12-09 NOTE — Telephone Encounter (Signed)
Dr. [name], please see Jason [name]'s note below. Patient wants to have infusion at [facility] due to difficultly in get authorizations from the [place].

## 2015-12-09 NOTE — Telephone Encounter (Signed)
If he wishes to do that, that's okay, just as long as he can get it done in a timely fashion. Thanks

## 2015-12-10 NOTE — Telephone Encounter (Signed)
Patient notified of recommendations. He will call the [place] to discuss this. He wants the IV iron appointments cancelled. Cancelled appointments per his request.

## 2015-12-11 ENCOUNTER — Telehealth: Payer: Self-pay | Admitting: Gastroenterology

## 2015-12-11 NOTE — Telephone Encounter (Signed)
Patient wants to know if he can have a PCP in [place] not he [place]. Patient will call the [place] to see about this.

## 2015-12-17 ENCOUNTER — Ambulatory Visit (HOSPITAL_COMMUNITY): Payer: Medicare Other

## 2015-12-24 ENCOUNTER — Ambulatory Visit (HOSPITAL_COMMUNITY): Payer: Medicare Other

## 2016-01-19 ENCOUNTER — Telehealth: Payer: Self-pay | Admitting: Gastroenterology

## 2016-01-20 NOTE — Telephone Encounter (Signed)
Left a message for patient's wife to call back. 

## 2016-01-21 NOTE — Telephone Encounter (Signed)
Left a message for patient's wife to call back. 

## 2016-01-22 NOTE — Telephone Encounter (Signed)
Spoke with patient's wife and she states she was told we need to "send a form requesting more OV." Called the [place] number on previous sheet in [place] and spoke with [name]. She requested all of his office notes. Faxed these to [phone]. She will call patient tomorrow and call me back tomorrow with update on if he will be seen at [facility] or here. Patient's wife aware.

## 2016-01-26 NOTE — Telephone Encounter (Signed)
Received a call from [name] that she has faxed patient's records to PCP at [facility] for review. She will let us know what happens.

## 2016-01-26 NOTE — Telephone Encounter (Signed)
Spoke with [name] at [facility] and she is working on getting patient seen there or visits approved here.

## 2016-02-02 ENCOUNTER — Telehealth: Payer: Self-pay | Admitting: *Deleted

## 2016-02-02 NOTE — Telephone Encounter (Signed)
Spoke with patient and he has not heard from [place] yet. Encouraged him to call them and check on progress for clearance to see [place] or for appointment with [facility].

## 2016-02-02 NOTE — Telephone Encounter (Signed)
-----   Message from [name], RN sent at 01/26/2016  2:40 PM EDT ----- Did VA call back about appointment.

## 2016-02-13 ENCOUNTER — Telehealth: Payer: Self-pay | Admitting: *Deleted

## 2016-02-13 NOTE — Telephone Encounter (Signed)
Spoke with patient and he states he is scheduled for IV iron at [place] in [place].

## 2016-02-13 NOTE — Telephone Encounter (Signed)
-----   Message from [name], RN sent at 02/02/2016  3:00 PM EDT ----- Did patient get appt. With [facility]?

## 2016-09-08 ENCOUNTER — Other Ambulatory Visit: Payer: Self-pay | Admitting: Orthopedic Surgery

## 2016-09-08 DIAGNOSIS — M4807 Spinal stenosis, lumbosacral region: Secondary | ICD-10-CM

## 2016-09-15 ENCOUNTER — Other Ambulatory Visit: Payer: Medicare Other

## 2016-10-20 ENCOUNTER — Other Ambulatory Visit: Payer: Self-pay | Admitting: Orthopedic Surgery

## 2016-10-29 ENCOUNTER — Encounter (HOSPITAL_COMMUNITY)
Admission: RE | Admit: 2016-10-29 | Discharge: 2016-10-29 | Disposition: A | Discharge status: Home / Self Care | Payer: Medicare Other | Source: Ambulatory Visit | Attending: Orthopedic Surgery | Admitting: Orthopedic Surgery

## 2016-10-29 ENCOUNTER — Encounter (HOSPITAL_COMMUNITY): Payer: Self-pay | Admitting: Emergency Medicine

## 2016-10-29 ENCOUNTER — Encounter (HOSPITAL_COMMUNITY): Payer: Self-pay

## 2016-10-29 ENCOUNTER — Ambulatory Visit (HOSPITAL_COMMUNITY)
Admission: RE | Admit: 2016-10-29 | Discharge: 2016-10-29 | Disposition: A | Discharge status: Home / Self Care | Payer: Medicare Other | Source: Ambulatory Visit | Attending: Orthopedic Surgery | Admitting: Orthopedic Surgery

## 2016-10-29 DIAGNOSIS — Z01812 Encounter for preprocedural laboratory examination: Secondary | ICD-10-CM | POA: Insufficient documentation

## 2016-10-29 DIAGNOSIS — Z87891 Personal history of nicotine dependence: Secondary | ICD-10-CM | POA: Insufficient documentation

## 2016-10-29 DIAGNOSIS — I10 Essential (primary) hypertension: Secondary | ICD-10-CM | POA: Diagnosis not present

## 2016-10-29 DIAGNOSIS — Z0183 Encounter for blood typing: Secondary | ICD-10-CM | POA: Insufficient documentation

## 2016-10-29 DIAGNOSIS — Z79899 Other long term (current) drug therapy: Secondary | ICD-10-CM | POA: Insufficient documentation

## 2016-10-29 DIAGNOSIS — K219 Gastro-esophageal reflux disease without esophagitis: Secondary | ICD-10-CM | POA: Insufficient documentation

## 2016-10-29 DIAGNOSIS — Z7982 Long term (current) use of aspirin: Secondary | ICD-10-CM | POA: Diagnosis not present

## 2016-10-29 DIAGNOSIS — Z8673 Personal history of transient ischemic attack (TIA), and cerebral infarction without residual deficits: Secondary | ICD-10-CM | POA: Diagnosis not present

## 2016-10-29 DIAGNOSIS — Z01818 Encounter for other preprocedural examination: Secondary | ICD-10-CM | POA: Diagnosis present

## 2016-10-29 DIAGNOSIS — F4310 Post-traumatic stress disorder, unspecified: Secondary | ICD-10-CM | POA: Diagnosis not present

## 2016-10-29 DIAGNOSIS — I2510 Atherosclerotic heart disease of native coronary artery without angina pectoris: Secondary | ICD-10-CM | POA: Diagnosis not present

## 2016-10-29 DIAGNOSIS — Z8546 Personal history of malignant neoplasm of prostate: Secondary | ICD-10-CM | POA: Insufficient documentation

## 2016-10-29 DIAGNOSIS — E785 Hyperlipidemia, unspecified: Secondary | ICD-10-CM | POA: Diagnosis not present

## 2016-10-29 HISTORY — DX: Unspecified osteoarthritis, unspecified site: M19.90

## 2016-10-29 HISTORY — DX: Adverse effect of unspecified anesthetic, initial encounter: T41.45XA

## 2016-10-29 HISTORY — DX: Transient cerebral ischemic attack, unspecified: G45.9

## 2016-10-29 LAB — COMPREHENSIVE METABOLIC PANEL
ALT: 18 U/L (ref 17–63)
AST: 25 U/L (ref 15–41)
Albumin: 3.6 g/dL (ref 3.5–5.0)
Alkaline Phosphatase: 137 U/L — ABNORMAL HIGH (ref 38–126)
Anion gap: 8 (ref 5–15)
BILIRUBIN TOTAL: 0.4 mg/dL (ref 0.3–1.2)
BUN: 14 mg/dL (ref 6–20)
CHLORIDE: 105 mmol/L (ref 101–111)
CO2: 29 mmol/L (ref 22–32)
Calcium: 9.1 mg/dL (ref 8.9–10.3)
Creatinine, Ser: 1.3 mg/dL — ABNORMAL HIGH (ref 0.61–1.24)
GFR, EST NON AFRICAN AMERICAN: 54 mL/min — AB (ref >60)
GLUCOSE: 90 mg/dL (ref 65–99)
Potassium: 4 mmol/L (ref 3.5–5.1)
Sodium: 142 mmol/L (ref 135–145)
Total Protein: 6.1 g/dL — ABNORMAL LOW (ref 6.5–8.1)

## 2016-10-29 LAB — TYPE AND SCREEN
ABO/RH(D): O POS
ANTIBODY SCREEN: NEGATIVE

## 2016-10-29 LAB — CBC WITH DIFFERENTIAL/PLATELET
BASOS ABS: 0.1 10*3/uL (ref 0.0–0.1)
Basophils Relative: 1 %
Eosinophils Absolute: 0.4 10*3/uL (ref 0.0–0.7)
Eosinophils Relative: 5 %
HEMATOCRIT: 32.1 % — AB (ref 39.0–52.0)
Hemoglobin: 9.9 g/dL — ABNORMAL LOW (ref 13.0–17.0)
LYMPHS ABS: 1.4 10*3/uL (ref 0.7–4.0)
LYMPHS PCT: 19 %
MCH: 29.4 pg (ref 26.0–34.0)
MCHC: 30.8 g/dL (ref 30.0–36.0)
MCV: 95.3 fL (ref 78.0–100.0)
MONO ABS: 0.5 10*3/uL (ref 0.1–1.0)
Monocytes Relative: 6 %
NEUTROS ABS: 5.3 10*3/uL (ref 1.7–7.7)
Neutrophils Relative %: 69 %
Platelets: 240 10*3/uL (ref 150–400)
RBC: 3.37 MIL/uL — AB (ref 4.22–5.81)
RDW: 15.9 % — ABNORMAL HIGH (ref 11.5–15.5)
WBC: 7.6 10*3/uL (ref 4.0–10.5)

## 2016-10-29 LAB — PROTIME-INR
INR: 1.01
Prothrombin Time: 13.3 seconds (ref 11.4–15.2)

## 2016-10-29 LAB — APTT: APTT: 26 s (ref 24–36)

## 2016-10-29 LAB — SURGICAL PCR SCREEN
MRSA, PCR: NEGATIVE
Staphylococcus aureus: NEGATIVE

## 2016-10-29 NOTE — Progress Notes (Addendum)
PCP - Dell Ponto Cardiologist - denies  Chest x-ray - 10/29/16 EKG - 10/29/16 Stress Test - thinks he had one in [place] reqesting ECHO - 09/01/15 Cardiac Cath - thinks he had one in [place] requesting   Sending to anesthesia for review of records Patient was unable to give urine sample at PAT appointment will bring with him day of surgery  Patient denies shortness of breath, fever, cough and chest pain at PAT appointment   Patient verbalized understanding of instructions that was given to them at the PAT appointment. Patient expressed that there were no further questions.  Patient was also instructed that they will need to review over the PAT instructions again at home before the surgery.

## 2016-10-29 NOTE — Pre-Procedure Instructions (Addendum)
Jason [name]  10/29/2016      [facility] - [address], [place] - [address] [place] [address] Phone: [phone] Fax: [phone]    Your procedure is scheduled on May 3  Report to [facility] at [place].M.  Call this number if you have problems the morning of surgery:  [phone]   Remember:  Do not eat food or drink liquids after midnight.   Take these medicines the morning of surgery with A SIP OF WATER gabapentin (NEURONTIN), oxyCODONE-acetaminophen (PERCOCET/ROXICET), pantoprazole (PROTONIX), FLUoxetine (PROZAC), eye drops, lamoTRIgine (LAMICTAL),   7 days prior to surgery STOP taking any Aspirin, Aleve, Naproxen, Ibuprofen, Motrin, Advil, Goody's, BC's, all herbal medications, fish oil, and all vitamins    Do not wear jewelry  Do not wear lotions, powders, or cologne, or deoderant.   Men may shave face and neck.  Do not bring valuables to the hospital.  [facility] is not responsible for any belongings or valuables.  Contacts, dentures or bridgework may not be worn into surgery.  Leave your suitcase in the car.  After surgery it may be brought to your room.  For patients admitted to the hospital, discharge time will be determined by your treatment team.  Patients discharged the day of surgery will not be allowed to drive home.    Special instructions:   [place]- Preparing For Surgery  Before surgery, you can play an important role. Because skin is not sterile, your skin needs to be as free of germs as possible. You can reduce the number of germs on your skin by washing with CHG (chlorahexidine gluconate) Soap before surgery.  CHG is an antiseptic cleaner which kills germs and bonds with the skin to continue killing germs even after washing.  Please do not use if you have an allergy to CHG or antibacterial soaps. If your skin becomes reddened/irritated stop using the  CHG.  Do not shave (including legs and underarms) for at least 48 hours prior to first CHG shower. It is OK to shave your face.  Please follow these instructions carefully.   1. Shower the NIGHT BEFORE SURGERY and the MORNING OF SURGERY with CHG.   2. If you chose to wash your hair, wash your hair first as usual with your normal shampoo.  3. After you shampoo, rinse your hair and body thoroughly to remove the shampoo.  4. Use CHG as you would any other liquid soap. You can apply CHG directly to the skin and wash gently with a scrungie or a clean washcloth.   5. Apply the CHG Soap to your body ONLY FROM THE NECK DOWN.  Do not use on open wounds or open sores. Avoid contact with your eyes, ears, mouth and genitals (private parts). Wash genitals (private parts) with your normal soap.  6. Wash thoroughly, paying special attention to the area where your surgery will be performed.  7. Thoroughly rinse your body with warm water from the neck down.  8. DO NOT shower/wash with your normal soap after using and rinsing off the CHG Soap.  9. Pat yourself dry with a CLEAN TOWEL.   10. Wear CLEAN PAJAMAS   11. Place CLEAN SHEETS on your bed the night of your first shower and DO NOT SLEEP WITH PETS.    Day of Surgery: Do not apply any deodorants/lotions. Please wear clean clothes to the  hospital/surgery center.      Please read over the following fact sheets that you were given.

## 2016-11-01 NOTE — Progress Notes (Addendum)
Anesthesia Chart Review:  Pt is a 71 year old male scheduled for L2-3, L3-4, L4-5 decompression; right sided L4-5 transforaminal lumbar interbody fusion with instrumentation and allograft on 11/04/2016 Phylliss Bob, M.D.  - PCP is Dell Ponto, MD at the [facility]  PMH includes:  CAD (moderate by cath 2007), HTN, hyperlipidemia, stroke (08/30/15), TIA, anemia, carotid stenosis, PTSD, prostate cancer, GERD. Former smoker. BMI 31.5.  Anesthesia history: Patient reports he "blew up" after surgery in 2000. Anesthesia record from [facility] in [place] dated 10/24/1998 and does not note any anesthesia complications. I suspect patient had some issues with fluid volume/edema after surgery. He reports he has had no issues with anesthesia during subsequent surgeries.   Medications include: ASA 325 mg, Lipitor, iron, lisinopril, Protonix, prazosin.  Preoperative labs reviewed.   - H/H 9.9/32.1.  Most recent comparison CBC was 06/22/16 (care everywhere) and H/H were found to be 11.1/35.4. Please note pt did have GI bleeding last spring due to heavy NSAID use concurrently with plavix.   CXR 10/29/16: No active cardiopulmonary disease.  EKG 10/29/16: NSR  Echo 08/31/15: - Left ventricle: The cavity size was normal. Systolic function was normal. The estimated ejection fraction was in the range of 55% to 60%. Wall motion was normal; there were no regional wall motion abnormalities. Doppler parameters are consistent with abnormal left ventricular relaxation (grade 1 diastolic dysfunction). - Mitral valve: There was mild regurgitation.  Carotid duplex 08/31/15:  - R ICA exhibits low range 40-59% stenosis.  - L ICA exhibits low range 80-99% stenosis.  - The right vertebral artery exhibits an atypical bidirectional waveform.  - The right subclavian artery appears dampened when compared to the left subclavian artery.  - The left vertebral artery is patent with antegrade flow.  Cardiac cath 12/08/05 ([facility]  in [place]): 1. Normal LV systolic function. EF 60-65%. 2. Moderate CAD, with no change in the appearance of the RCA from 2005 (mid 30% stenosis; CX with mild irregularities; RCA proximal 30-40% stenosis, mid 40% stenosis)  Reviewed case with Dr. Kalman Shan.  Pt had a stroke 08/30/15 and did not follow up with neuro as directed.  At the time of his stroke, he had severe in the L ICA; this has also not been followed up on.  Pt will need repeat carotid duplex and clearance from neuro prior to surgery.  I notified Carla in Dr. Laurena Bering office.   Willeen Cass, FNP-BC [facility]/Anesthesiology Phone: [phone] 11/02/2016 1:43 PM

## 2016-11-04 ENCOUNTER — Inpatient Hospital Stay (HOSPITAL_COMMUNITY): Admission: RE | Admit: 2016-11-04 | Payer: Medicare Other | Source: Ambulatory Visit | Admitting: Orthopedic Surgery

## 2016-11-04 ENCOUNTER — Encounter (HOSPITAL_COMMUNITY): Admission: RE | Payer: Self-pay | Source: Ambulatory Visit

## 2016-11-04 SURGERY — POSTERIOR LUMBAR FUSION 1 LEVEL
Anesthesia: General | Laterality: Right

## 2016-12-06 ENCOUNTER — Emergency Department (HOSPITAL_COMMUNITY): Payer: Non-veteran care

## 2016-12-06 ENCOUNTER — Emergency Department (HOSPITAL_COMMUNITY)
Admission: EM | Admit: 2016-12-06 | Discharge: 2016-12-07 | Disposition: A | Discharge status: Home / Self Care | Payer: Non-veteran care | Attending: Emergency Medicine | Admitting: Emergency Medicine

## 2016-12-06 ENCOUNTER — Encounter (HOSPITAL_COMMUNITY): Payer: Self-pay | Admitting: Emergency Medicine

## 2016-12-06 DIAGNOSIS — R911 Solitary pulmonary nodule: Secondary | ICD-10-CM | POA: Insufficient documentation

## 2016-12-06 DIAGNOSIS — R531 Weakness: Secondary | ICD-10-CM

## 2016-12-06 DIAGNOSIS — Z79899 Other long term (current) drug therapy: Secondary | ICD-10-CM | POA: Insufficient documentation

## 2016-12-06 DIAGNOSIS — N289 Disorder of kidney and ureter, unspecified: Secondary | ICD-10-CM | POA: Insufficient documentation

## 2016-12-06 DIAGNOSIS — Z8546 Personal history of malignant neoplasm of prostate: Secondary | ICD-10-CM | POA: Diagnosis not present

## 2016-12-06 DIAGNOSIS — M5030 Other cervical disc degeneration, unspecified cervical region: Secondary | ICD-10-CM | POA: Insufficient documentation

## 2016-12-06 DIAGNOSIS — I10 Essential (primary) hypertension: Secondary | ICD-10-CM | POA: Insufficient documentation

## 2016-12-06 DIAGNOSIS — M489 Spondylopathy, unspecified: Secondary | ICD-10-CM

## 2016-12-06 DIAGNOSIS — Z8673 Personal history of transient ischemic attack (TIA), and cerebral infarction without residual deficits: Secondary | ICD-10-CM | POA: Diagnosis not present

## 2016-12-06 DIAGNOSIS — Z87891 Personal history of nicotine dependence: Secondary | ICD-10-CM | POA: Insufficient documentation

## 2016-12-06 DIAGNOSIS — R55 Syncope and collapse: Secondary | ICD-10-CM | POA: Diagnosis not present

## 2016-12-06 DIAGNOSIS — R29898 Other symptoms and signs involving the musculoskeletal system: Secondary | ICD-10-CM

## 2016-12-06 DIAGNOSIS — Z7982 Long term (current) use of aspirin: Secondary | ICD-10-CM | POA: Diagnosis not present

## 2016-12-06 LAB — COMPREHENSIVE METABOLIC PANEL
ALBUMIN: 4.1 g/dL (ref 3.5–5.0)
ALK PHOS: 112 U/L (ref 38–126)
ALT: 21 U/L (ref 17–63)
AST: 26 U/L (ref 15–41)
Anion gap: 14 (ref 5–15)
BILIRUBIN TOTAL: 0.3 mg/dL (ref 0.3–1.2)
BUN: 25 mg/dL — AB (ref 6–20)
CO2: 18 mmol/L — ABNORMAL LOW (ref 22–32)
CREATININE: 2.34 mg/dL — AB (ref 0.61–1.24)
Calcium: 8.8 mg/dL — ABNORMAL LOW (ref 8.9–10.3)
Chloride: 104 mmol/L (ref 101–111)
GFR calc Af Amer: 31 mL/min — ABNORMAL LOW (ref >60)
GFR, EST NON AFRICAN AMERICAN: 27 mL/min — AB (ref >60)
Glucose, Bld: 165 mg/dL — ABNORMAL HIGH (ref 65–99)
POTASSIUM: 3.5 mmol/L (ref 3.5–5.1)
Sodium: 136 mmol/L (ref 135–145)
TOTAL PROTEIN: 7 g/dL (ref 6.5–8.1)

## 2016-12-06 LAB — CBC
HCT: 39.2 % (ref 39.0–52.0)
Hemoglobin: 12.6 g/dL — ABNORMAL LOW (ref 13.0–17.0)
MCH: 30.1 pg (ref 26.0–34.0)
MCHC: 32.1 g/dL (ref 30.0–36.0)
MCV: 93.8 fL (ref 78.0–100.0)
PLATELETS: 225 10*3/uL (ref 150–400)
RBC: 4.18 MIL/uL — ABNORMAL LOW (ref 4.22–5.81)
RDW: 13.4 % (ref 11.5–15.5)
WBC: 10.8 10*3/uL — ABNORMAL HIGH (ref 4.0–10.5)

## 2016-12-06 LAB — CBG MONITORING, ED: Glucose-Capillary: 157 mg/dL — ABNORMAL HIGH (ref 65–99)

## 2016-12-06 LAB — I-STAT CHEM 8, ED
BUN: 29 mg/dL — ABNORMAL HIGH (ref 6–20)
CHLORIDE: 104 mmol/L (ref 101–111)
CREATININE: 2.3 mg/dL — AB (ref 0.61–1.24)
Calcium, Ion: 1.11 mmol/L — ABNORMAL LOW (ref 1.15–1.40)
Glucose, Bld: 173 mg/dL — ABNORMAL HIGH (ref 65–99)
HEMATOCRIT: 40 % (ref 39.0–52.0)
HEMOGLOBIN: 13.6 g/dL (ref 13.0–17.0)
POTASSIUM: 3.6 mmol/L (ref 3.5–5.1)
Sodium: 140 mmol/L (ref 135–145)
TCO2: 23 mmol/L (ref 0–100)

## 2016-12-06 LAB — DIFFERENTIAL
BASOS ABS: 0 10*3/uL (ref 0.0–0.1)
Basophils Relative: 0 %
EOS ABS: 0 10*3/uL (ref 0.0–0.7)
Eosinophils Relative: 0 %
LYMPHS ABS: 1 10*3/uL (ref 0.7–4.0)
LYMPHS PCT: 9 %
MONOS PCT: 4 %
Monocytes Absolute: 0.4 10*3/uL (ref 0.1–1.0)
NEUTROS ABS: 9.4 10*3/uL — AB (ref 1.7–7.7)
NEUTROS PCT: 87 %

## 2016-12-06 LAB — I-STAT TROPONIN, ED: Troponin i, poc: 0 ng/mL (ref 0.00–0.08)

## 2016-12-06 LAB — PROTIME-INR
INR: 1.1
PROTHROMBIN TIME: 14.3 s (ref 11.4–15.2)

## 2016-12-06 LAB — APTT: APTT: 25 s (ref 24–36)

## 2016-12-06 MED ORDER — OXYCODONE HCL 5 MG PO TABS
10.0000 mg | ORAL_TABLET | Freq: Once | ORAL | Status: AC
  Administered 2016-12-06: 10 mg via ORAL
  Filled 2016-12-06: qty 2

## 2016-12-06 MED ORDER — SODIUM CHLORIDE 0.9 % IV BOLUS (SEPSIS)
1000.0000 mL | Freq: Once | INTRAVENOUS | Status: AC
  Administered 2016-12-06: 1000 mL via INTRAVENOUS

## 2016-12-06 MED ORDER — IOPAMIDOL (ISOVUE-370) INJECTION 76%
INTRAVENOUS | Status: AC
  Filled 2016-12-06: qty 50

## 2016-12-06 MED ORDER — FENTANYL CITRATE (PF) 100 MCG/2ML IJ SOLN
50.0000 ug | Freq: Once | INTRAMUSCULAR | Status: AC
  Administered 2016-12-07: 50 ug via INTRAVENOUS
  Filled 2016-12-06: qty 2

## 2016-12-06 NOTE — ED Notes (Signed)
Patient's wife is in the room at this time.  Patient transported to MRI by stroke RN and Sherlyn Lick.

## 2016-12-06 NOTE — Consult Note (Signed)
Neurology Consultation Reason for Consult: Stroke Referring Physician: Sabra Heck, B  CC: Left sided weakness  History is obtained from:patient  HPI: Jason [name] is a 71 y.o. male who was last seen normal by walking around around 11 AM. He then fell on the stairs. He states that he just sat down and the bump down. He was lightheaded, and may have passed out. While he was in the shower last night, he did fall and hit his head and lose continence.  On arrival, the patient had low blood pressure with systolic in the 38V(F don't think this transferred to epic). He was given the fluid bolus of 1 L and even prior to completion, his blood pressure had improved to the 110s.   LKW: 3 pm tpa given?: no, out of window.    ROS: A 14 point ROS was performed and is negative except as noted in the HPI.   Past Medical History:  Diagnosis Date  . Acid reflux disease with ulcer   . AKI (acute kidney injury) ([place])   . Anemia   . Arthritis   . Bipolar 1 disorder ([place])   . Carotid stenosis   . Chronic back pain   . Complication of anesthesia 2000   reaction to anesthesia had some swelling   . Depression   . GERD (gastroesophageal reflux disease)   . Hearing loss   . Hyperlipidemia   . Hypertension   . Prostate cancer [facility])    prostate cancer  . PTSD (post-traumatic stress disorder)   . Stroke ([place]) 08/2015  . TIA (transient ischemic attack)    has had several mini strokes     Family History  Problem Relation Age of Onset  . Stroke Mother        Hemorrhagic stroke  . Cancer Father        Pancreatic cancer  . Hypertension Father      Social History:  reports that he quit smoking about 13 years ago. He has never used smokeless tobacco. He reports that he does not drink alcohol or use drugs.   Exam: Current vital signs: Wt 79.4 kg (175 lb 0.7 oz)   BMI 30.05 kg/m  Vital signs in last 24 hours: Weight:  [79.4 kg (175 lb 0.7 oz)] 79.4 kg (175 lb 0.7 oz) (06/04 1900)   Physical  Exam  Constitutional: Appears well-developed and well-nourished.  Psych: Affect appropriate to situation Eyes: No scleral injection HENT: No OP obstrucion Head: Normocephalic.  Cardiovascular: Normal rate and regular rhythm.  Respiratory: Effort normal and breath sounds normal to anterior ascultation GI: Soft.  No distension. There is no tenderness.  Skin: WDI  Neuro: Mental Status: Patient is awake, alert, oriented to person, place, month, year, and situation. He does appear to have some difficulty with recent events(giving history of deficits) No signs of aphasia  He appears to extinguish to DSS in the left leg, though inconsistently.  Cranial Nerves: II: Visual Fields are full. Pupils are equal, round, and reactive to light.   III,IV, VI: EOMI without ptosis or diploplia.  V: Facial sensation is symmetric to temperature VII: Facial movement with mild left droop.  VIII: hearing is intact to voice X: Uvula elevates symmetrically XI: Shoulder shrug is symmetric. XII: tongue is midline without atrophy or fasciculations.  Motor: Tone is normal. Bulk is normal. 5/5 strength was present on the right, 5/5 in the left arm though with significant tremor. He has 4-/5 weakness of the left leg.   Sensory:  Sensation is symmetric to light touch in the arms and decreased in the left leg . Cerebellar: He has prominent tremor bilaterally, and seems to have some pass pointing in both arms.  DTR: 3+ in the right knee, 4+ in the left knee brisk in the arms bilaterally as well.  I have reviewed labs in epic and the results pertinent to this consultation are: Cr 2.3  I have reviewed the images obtained:CT head - negative  Impression: 71 yo M with acute gait disturbance after fall. With his hypotension on arrival, I do wonder about syncope as a cause for his falls. With his persistent left leg weakness and hyperreflexia, I do think that his spine is imaging as well.  His prodrome of  lightheadedness prior to both episodes of falling, I think that there etiology such as seizure her relatively unlikely.  Recommendations: 1) MRI cervical and thoracic spine 2) further recommendations following above imaging  Roland Rack, MD [facility] [phone]  If 7pm- 7am, please page neurology on call as listed in [place].

## 2016-12-06 NOTE — ED Notes (Signed)
Pt presents via [facility] from home where he began having trouble using left arm and leg around 1500; per wife, speech also abnormal and pt also could not identify a pen per EMS; pt has hx of prior CVA; pts left arm and hand clinched, left leg with drift; pt continuously moving these extremities

## 2016-12-06 NOTE — Code Documentation (Signed)
Responded to code stroke called at 3.  Pt arrived vic [facility] @ 1937. Around 1500, pt began having trouble using L arm and L leg. Per wife, pt with mildly slurred speech.  Per EMS, pt slightly aphasic.  Pt with prior h/o CVA. [id number] on arrival.  NIH-7 for L leg droop, L sided ataxia, LLE sensory deficit, neglect, and mildly slurred speech. Pt L arm and L hand clenched shut with difficulty opening it to command. CT negative for acute findings.  TPA not given d/t pt out of window. Pt taken for STAT MRI. No IR intervention needed at this time.  Plan to admit to stroke team.

## 2016-12-06 NOTE — ED Notes (Signed)
Patient taken to CT.

## 2016-12-06 NOTE — ED Notes (Signed)
Pt returning to emergency department with RN

## 2016-12-06 NOTE — ED Notes (Signed)
Pt stating PO oxy did not help pain, also states he is hungry; RN encouraged patient not to eat until all diagnostic testing was complete; pt verbalized understanding

## 2016-12-06 NOTE — ED Provider Notes (Signed)
[place] [facility] DEPT Provider Note   CSN: [id number] Arrival date & time: 12/06/16  1938     History   Chief Complaint Chief Complaint  Patient presents with  . Code Stroke    HPI Jason [name] is a 71 y.o. male.  HPI  The patient is a 71 year old male, he does have a history of prior stroke affecting the left side of his body, ambulates at baseline with a walker or a cane but states that today he was so weak that he could not walk down the stairs, the left side of his body was not working correctly, he slid down the stairs on his buttocks and was he was at the bottom he could not get up off the ground because of weakness especially on the left side. He was noted to have slurred speech as well during that timeframe. The paramedics reported that he had increased tone in his left hand and could not open his hand. He had generalized weakness on the left side. The patient has had prior stroke affecting the left side but was not dispensed. This was an acute change and occurred within 1 hour prior to arrival. Paramedics immediately transported the patient hospital.  When the patient's spouse arrived to the hospital she further clarified history stating that he was last seen normal at approximately 11:00 this morning, he had a normal morning, was paying bills and doing other daily functions. She does report that he had a syncopal episode last night, struck his head, had some urinary incontinence.  She reports that she found him on the stairs at approximately 2:00 PM, he was at the bottom of the stairs, he was unable to off the ground, he seemed to be confused and detached, they tried to stand him up with one person on each arm and he could not keep his balance. He took a nap, when he woke up he was even worse. That's what I called the paramedics.  Past Medical History:  Diagnosis Date  . Acid reflux disease with ulcer   . AKI (acute kidney injury) ([place])   . Anemia   . Arthritis   . Bipolar 1  disorder ([place])   . Carotid stenosis   . Chronic back pain   . Complication of anesthesia 2000   reaction to anesthesia had some swelling   . Depression   . GERD (gastroesophageal reflux disease)   . Hearing loss   . Hyperlipidemia   . Hypertension   . Prostate cancer [facility])    prostate cancer  . PTSD (post-traumatic stress disorder)   . Stroke ([place]) 08/2015  . TIA (transient ischemic attack)    has had several mini strokes    Patient Active Problem List   Diagnosis Date Noted  . Acute blood loss anemia   . Melena   . Anemia 10/14/2015  . Hyperglycemia 10/14/2015  . Acid reflux disease with ulcer   . Bleeding gastrointestinal   . Symptomatic anemia   . Upper GI bleeding   . Carotid artery stenosis   . HLD (hyperlipidemia) 08/31/2015  . CVA (cerebral infarction) 08/30/2015  . Degeneration of lumbar or lumbosacral intervertebral disc 05/16/2014  . Weakness 05/03/2014  . Dysarthria 04/30/2014  . AKI (acute kidney injury) ([place]) 04/30/2014  . Hypotension 04/30/2014  . Fall 04/30/2014  . CVA (cerebral vascular accident) ([place]) 04/13/2014  . Stroke ([place]) 04/13/2014  . CAP (community acquired pneumonia) 02/13/2013  . Musculoskeletal pain 02/13/2013  . Hx of completed stroke 02/13/2013  .  Bipolar 1 disorder ([place]) 02/13/2013  . Depression 02/13/2013  . HTN (hypertension) 02/13/2013  . H/O prostate cancer 02/13/2013  . Nausea with vomiting 02/13/2013  . GERD (gastroesophageal reflux disease) 02/13/2013  . Poor balance 02/13/2013    Past Surgical History:  Procedure Laterality Date  . BACK SURGERY    . CARDIAC CATHETERIZATION     cant remember when  . COLONOSCOPY N/A 10/16/2015   Procedure: COLONOSCOPY;  Surgeon: [name], MD;  Location: [facility] ENDOSCOPY;  Service: Gastroenterology;  Laterality: N/A;  . ESOPHAGOGASTRODUODENOSCOPY N/A 10/15/2015   Procedure: ESOPHAGOGASTRODUODENOSCOPY (EGD);  Surgeon: [name], MD;  Location: [place];  Service:  Gastroenterology;  Laterality: N/A;  . EYE SURGERY     cataracts  . GIVENS CAPSULE STUDY N/A 10/16/2015   Procedure: GIVENS CAPSULE STUDY;  Surgeon: [name], MD;  Location: [place];  Service: Gastroenterology;  Laterality: N/A;  . PROSTATE SURGERY         Home Medications    Prior to Admission medications   Medication Sig Start Date End Date Taking? Authorizing Provider  aspirin 325 MG tablet Take 325 mg by mouth daily.   Yes [provider]  atorvastatin (LIPITOR) 40 MG tablet Take 1 tablet (40 mg total) by mouth daily at 6 PM. Patient taking differently: Take 40 mg by mouth at bedtime.  04/14/14  Yes Mikhail, [place], DO  Calcium Carbonate (CALCIUM 600 PO) Take 2 tablets by mouth daily.   Yes [provider]  Carboxymethylcellulose Sodium (THERATEARS OP) Place 1 drop into both eyes 4 (four) times daily.   Yes [provider]  Cholecalciferol (VITAMIN D-3) 1000 units CAPS Take 1,000 Units by mouth daily.   Yes [provider]  ferrous sulfate 325 (65 FE) MG tablet Take 1 tablet (325 mg total) by mouth 2 (two) times daily with a meal. Patient taking differently: Take 650 mg by mouth daily.  10/17/15  Yes Robbie Lis, MD  FLUoxetine (PROZAC) 20 MG capsule Take 60 mg by mouth daily.   Yes [provider]  gabapentin (NEURONTIN) 600 MG tablet Take 1,200 mg by mouth at bedtime.   Yes [provider]  lamoTRIgine (LAMICTAL) 25 MG tablet Take 100 mg by mouth at bedtime.    Yes [provider]  lisinopril (PRINIVIL,ZESTRIL) 40 MG tablet Take 40 mg by mouth daily.   Yes [provider]  MENTHOL-METHYL SALICYLATE EX Apply 1 application topically See admin instructions. TO AFFECTED AREA(S) THREE TIMES A DAY AS NEEDED FOR PAIN   Yes [provider]  mirtazapine (REMERON) 45 MG tablet Take 45 mg by mouth at bedtime.   Yes [provider]  Omega-3 Fatty Acids (FISH OIL) 1000 MG CAPS Take  2,000 mg by mouth at bedtime.    Yes [provider]  omeprazole (PRILOSEC) 20 MG capsule Take 20 mg by mouth 2 (two) times daily before a meal.   Yes [provider]  Oxycodone HCl 10 MG TABS Take 10 mg by mouth 3 (three) times daily as needed (for pain).   Yes [provider]  oxymetazoline (AFRIN) 0.05 % nasal spray Place 1-2 sprays into both nostrils 2 (two) times daily as needed for congestion.   Yes [provider]  prazosin (MINIPRESS) 1 MG capsule Take 3 mg by mouth at bedtime.   Yes [provider]  Glucosamine Sulfate 1000 MG CAPS Take 2,000 mg by mouth daily.    [provider]  pantoprazole (PROTONIX) 40  MG tablet Take 1 tablet (40 mg total) by mouth daily at 6 (six) AM. Patient not taking: Reported on 12/06/2016 10/17/15   Robbie Lis, MD    Family History Family History  Problem Relation Age of Onset  . Stroke Mother        Hemorrhagic stroke  . Cancer Father        Pancreatic cancer  . Hypertension Father     Social History Social History  Substance Use Topics  . Smoking status: Former Smoker    Quit date: 03/06/2003  . Smokeless tobacco: Never Used  . Alcohol use No     Comment: drank years ago     Allergies   Penicillins   Review of Systems Review of Systems  All other systems reviewed and are negative.    Physical Exam Updated Vital Signs BP 105/64   Pulse 75   Temp 97.7 F (36.5 C)   Resp 13   Wt 79.4 kg (175 lb 0.7 oz)   SpO2 94%   BMI 30.05 kg/m   Physical Exam  Constitutional: He appears well-developed and well-nourished. No distress.  HENT:  Head: Normocephalic and atraumatic.  Mouth/Throat: Oropharynx is clear and moist. No oropharyngeal exudate.  Eyes: Conjunctivae and EOM are normal. Pupils are equal, round, and reactive to light. Right eye exhibits no discharge. Left eye exhibits no discharge. No scleral icterus.  Neck: Normal range of motion. Neck supple. No JVD present. No  thyromegaly present.  Cardiovascular: Normal rate, regular rhythm, normal heart sounds and intact distal pulses.  Exam reveals no gallop and no friction rub.   No murmur heard. Pulmonary/Chest: Effort normal and breath sounds normal. No respiratory distress. He has no wheezes. He has no rales.  Abdominal: Soft. Bowel sounds are normal. He exhibits no distension and no mass. There is no tenderness.  Musculoskeletal: Normal range of motion. He exhibits no edema or tenderness.  Lymphadenopathy:    He has no cervical adenopathy.  Neurological: He is alert. Coordination normal.  The patient has normal right-sided strength in the grip in the leg, he he has decreased strength on the left upper and left lower extremity though it is slight, he does have increased tone in his left hand, there is no facial droop, speech is slowed but clear  Skin: Skin is warm and dry. No rash noted. No erythema.  Psychiatric: He has a normal mood and affect. His behavior is normal.  Nursing note and vitals reviewed.    ED Treatments / Results  Labs (all labs ordered are listed, but only abnormal results are displayed) Labs Reviewed  CBC - Abnormal; Notable for the following:       Result Value   WBC 10.8 (*)    RBC 4.18 (*)    Hemoglobin 12.6 (*)    All other components within normal limits  DIFFERENTIAL - Abnormal; Notable for the following:    Neutro Abs 9.4 (*)    All other components within normal limits  COMPREHENSIVE METABOLIC PANEL - Abnormal; Notable for the following:    CO2 18 (*)    Glucose, Bld 165 (*)    BUN 25 (*)    Creatinine, Ser 2.34 (*)    Calcium 8.8 (*)    GFR calc non Af Amer 27 (*)    GFR calc Af Amer 31 (*)    All other components within normal limits  CBG MONITORING, ED - Abnormal; Notable for the following:    Glucose-Capillary 157 (*)  All other components within normal limits  I-STAT CHEM 8, ED - Abnormal; Notable for the following:    BUN 29 (*)    Creatinine, Ser 2.30  (*)    Glucose, Bld 173 (*)    Calcium, Ion 1.11 (*)    All other components within normal limits  PROTIME-INR  APTT  I-STAT TROPOININ, ED    EKG  EKG Interpretation  Date/Time:  Monday December 06 2016 20:47:32 EDT Ventricular Rate:  75 PR Interval:    QRS Duration: 86 QT Interval:  429 QTC Calculation: 480 R Axis:   65 Text Interpretation:  Sinus rhythm Borderline prolonged QT interval since last tracing no significant change Confirmed by Noemi Chapel [phone]) on 12/06/2016 11:51:45 PM       Radiology Ct Cervical Spine Wo Contrast  Result Date: 12/06/2016 CLINICAL DATA:  Cervical neck pain after fall down stair case. EXAM: CT CERVICAL SPINE WITHOUT CONTRAST TECHNIQUE: Multidetector CT imaging of the cervical spine was performed without intravenous contrast. Multiplanar CT image reconstructions were also generated. COMPARISON:  No dedicated cervical spine imaging. FINDINGS: Alignment: Normal. Skull base and vertebrae: No acute fracture. Skullbase and dens are intact. Vertebral body heights are preserved. Soft tissues and spinal canal: No prevertebral fluid or swelling. No visible canal hematoma. Disc levels: Disc space narrowing and endplate spurring is most prominent at C5-C6 with a posterior disc osteophyte complex causing mild narrowing of the spinal canal. Scattered facet arthropathy. Upper chest: There is a 10 mm subpleural nodule at the right lung apex, previously 9 mm on neck CTA 09/01/2015. Other: Carotid calcifications. Encephalomalacia in the right cerebellum better characterized on brain MRI earlier this day. IMPRESSION: 1. Degenerative change in the cervical spine without acute fracture or subluxation. 2. A pulmonary nodule of the right lung apex measures 10 mm, increased by 1 mm compared to neck CTA 09/01/2015. Consider PET-CT characterization. Electronically Signed   By: Jeb Levering M.D.   On: 12/06/2016 23:16   Mr Virgel Paling IO Contrast  Result Date: 12/06/2016 CLINICAL DATA:   New onset left-sided weakness at 3 p.m. today. Abnormal speech. The examination had to be discontinued prior to completion due to need to stabilize the patient. EXAM: MRI HEAD WITHOUT CONTRAST MRA HEAD WITHOUT CONTRAST TECHNIQUE: Multiplanar, multiecho pulse sequences of the brain and surrounding structures were obtained without intravenous contrast. Angiographic images of the head were obtained using MRA technique without contrast. COMPARISON:  CT head without contrast from the same day. FINDINGS: MRI HEAD FINDINGS Brain: The diffusion-weighted images demonstrate no acute or subacute infarction. 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 Moderate confluent periventricular and scattered subcortical T2 hyperintensities are again noted bilaterally. White matter changes extend into the brainstem, similar to the prior study. No acute hemorrhage or mass lesion is present. The ventricles are proportionate to the degree of atrophy. No significant extra-axial fluid collection is present. Vascular: Flow is present in the major intracranial arteries. Skull and upper cervical spine: The skullbase is unremarkable. Cervical spine is not imaged without sagittal T1 weighted imaging. Sinuses/Orbits: The paranasal sinuses and the mastoid air cells are clear. The globes and orbits are unremarkable. MRA HEAD FINDINGS The internal carotid artery is are within normal limits from the high cervical segments through the ICA termini bilaterally. The A1 and M1 segments are normal. Anterior communicating artery is patent. There is some attenuation of distal MCA branch vessels bilaterally. There is focal signal loss in the proximal anterior right M2 segment.  This may reflect a focal stenosis versus artifact. More distal branches are intact, suggesting artifact. The left vertebral artery is the dominant vessel. Only the very distal right vertebral artery is seen. The more proximal vessel may be occluded or  hypoplastic. The basilar artery is within normal limits. Both posterior cerebral arteries originate from basilar tip. There is asymmetric attenuation of distal PCA branch vessels on the left. IMPRESSION: 1. No acute intracranial abnormality. 2. Confluent periventricular and scattered subcortical T2 hyperintensities bilaterally are moderately advanced for age. 3. Question narrowing of the anterior right M2 division. 4. Mild distal small vessel disease without other significant proximal emergent large vessel occlusion. Electronically Signed   By: San Morelle M.D.   On: 12/06/2016 21:07   Mr Brain Wo Contrast  Result Date: 12/06/2016 CLINICAL DATA:  New onset left-sided weakness at 3 p.m. today. Abnormal speech. The examination had to be discontinued prior to completion due to need to stabilize the patient. EXAM: MRI HEAD WITHOUT CONTRAST MRA HEAD WITHOUT CONTRAST TECHNIQUE: Multiplanar, multiecho pulse sequences of the brain and surrounding structures were obtained without intravenous contrast. Angiographic images of the head were obtained using MRA technique without contrast. COMPARISON:  CT head without contrast from the same day. FINDINGS: MRI HEAD FINDINGS Brain: The diffusion-weighted images demonstrate no acute or subacute infarction. 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 Moderate confluent periventricular and scattered subcortical T2 hyperintensities are again noted bilaterally. White matter changes extend into the brainstem, similar to the prior study. No acute hemorrhage or mass lesion is present. The ventricles are proportionate to the degree of atrophy. No significant extra-axial fluid collection is present. Vascular: Flow is present in the major intracranial arteries. Skull and upper cervical spine: The skullbase is unremarkable. Cervical spine is not imaged without sagittal T1 weighted imaging. Sinuses/Orbits: The paranasal sinuses and the mastoid air  cells are clear. The globes and orbits are unremarkable. MRA HEAD FINDINGS The internal carotid artery is are within normal limits from the high cervical segments through the ICA termini bilaterally. The A1 and M1 segments are normal. Anterior communicating artery is patent. There is some attenuation of distal MCA branch vessels bilaterally. There is focal signal loss in the proximal anterior right M2 segment. This may reflect a focal stenosis versus artifact. More distal branches are intact, suggesting artifact. The left vertebral artery is the dominant vessel. Only the very distal right vertebral artery is seen. The more proximal vessel may be occluded or hypoplastic. The basilar artery is within normal limits. Both posterior cerebral arteries originate from basilar tip. There is asymmetric attenuation of distal PCA branch vessels on the left. IMPRESSION: 1. No acute intracranial abnormality. 2. Confluent periventricular and scattered subcortical T2 hyperintensities bilaterally are moderately advanced for age. 3. Question narrowing of the anterior right M2 division. 4. Mild distal small vessel disease without other significant proximal emergent large vessel occlusion. Electronically Signed   By: San Morelle M.D.   On: 12/06/2016 21:07   Ct Head Code Stroke W/o Cm  Result Date: 12/06/2016 CLINICAL DATA:  Code stroke. New onset gait problems. Last seen well at 3 p.m. today. EXAM: CT HEAD WITHOUT CONTRAST TECHNIQUE: Contiguous axial images were obtained from the base of the skull through the vertex without intravenous contrast. COMPARISON:  CT head without contrast 10/14/2015. MRI brain 08/30/2015. FINDINGS: Brain: Confluent periventricular white matter hypoattenuation bilaterally is similar the prior study. Basal ganglia are stable. No acute cortical infarct, hemorrhage, or mass lesion is  present. Remote infarct of the right cerebellum and remote lacunar infarcts of the basal ganglia bilaterally are  stable. Vascular: Atherosclerotic calcifications within the cavernous internal carotid artery's are stable. There is no hyperdense vessel. Calcifications are also present at the dural margin of the left vertebral artery. Skull: Calvarium is intact. No focal lytic or blastic lesions are present. Sinuses/Orbits: The paranasal sinuses and mastoid air cells are clear. ASPECTS [facility] Stroke Program Early CT Score) - Ganglionic level infarction (caudate, lentiform nuclei, internal capsule, insula, M1-M3 cortex): 7/7 - Supraganglionic infarction (M4-M6 cortex): 3/3 Total score (0-10 with 10 being normal): 10/10 IMPRESSION: 1. No acute intracranial abnormality or significant interval change. 2. Stable atrophy and white matter disease. 3. Stable remote lacunar infarcts involving the basal ganglia and periventricular white matter. 4. Remote infarct of the right inferior cerebellum is stable. 5. ASPECTS is 10/10 These results were text paged at the time of interpretation on 12/06/2016 at 8:08 pm to Dr. Leonel Ramsay. Electronically Signed   By: San Morelle M.D.   On: 12/06/2016 20:09    Procedures Procedures (including critical care time)  Medications Ordered in ED Medications  iopamidol (ISOVUE-370) 76 % injection (not administered)  fentaNYL (SUBLIMAZE) injection 50 mcg (not administered)  sodium chloride 0.9 % bolus 1,000 mL (0 mLs Intravenous Stopped 12/06/16 2051)  sodium chloride 0.9 % bolus 1,000 mL (1,000 mLs Intravenous New Bag/Given 12/06/16 2100)  oxyCODONE (Oxy IR/ROXICODONE) immediate release tablet 10 mg (10 mg Oral Given 12/06/16 2125)     Initial Impression / Assessment and Plan / ED Course  I have reviewed the triage vital signs and the nursing notes.  Pertinent labs & imaging results that were available during my care of the patient were reviewed by me and considered in my medical decision making (see chart for details).   The patient has new neurologic findings that will require  neurologic evaluation, he was seen by the stroke team upon arrival. He has gone to the CT scanner and the CT scan initially was read as no acute intracranial abnormalities without any acute stroke. There was findings of prior remote strokes.  Would also consider seizures as a possible etiology, but also consider syncope.  CT scan reviewed of the cervical spine showing no signs of fracture, MRI of the brain also showing no signs of stroke. The patient was initially hypotensive on the scene when the wife found him on the stairs with a blood pressure of 60, at that time he could not move his legs. She also reports that 2 days ago when this happened to him and he passed out he also could not move his legs but then was able to regain total function of his legs a short time later. On repeat exam multiple times including the last exam at 11:50 PM the patient was able to straight leg raise bilaterally with 5 out of 5 strength, normal mentation, normal upper extremity strength, he has no tremor, no clonus, normal reflexes at the knees, normal sensation bilaterally. I discussed his care with the neurologist who agrees that the patient can go home if his MRI of the cervical spine is normal. I have informed the family of these results and the need for close follow-up for both a recheck of his creatinine as well as general family doctor follow-up this week. They have agreed. I will give them a copy of the results to share with the family doctor. The patient states he has been drinking lots of clear fluids this week,  his wife agrees.    I do not see any medicines that would be causing his hypotension and he has not been hypotensive here  At change of shift care signed out to Dr. Christy Gentles to follow up the MRI C spine results.  Final Clinical Impressions(s) / ED Diagnoses   Final diagnoses:  Syncope, unspecified syncope type  Renal insufficiency    New Prescriptions New Prescriptions   No medications on file      Noemi Chapel, MD 12/07/16 [phone]

## 2016-12-06 NOTE — ED Notes (Signed)
Wife at bedside also reports that tremor that pt demonstrates is new also

## 2016-12-06 NOTE — ED Notes (Signed)
Pt's CBG result was 157. Informed Clarise Cruz - RN.

## 2016-12-07 ENCOUNTER — Emergency Department (HOSPITAL_COMMUNITY): Payer: Non-veteran care

## 2016-12-07 NOTE — ED Notes (Signed)
Patient transported to MRI 

## 2016-12-07 NOTE — ED Provider Notes (Signed)
Pt awaiting MRI cervical/thoracic per neurology D/w dr Leonel Ramsay If mri negative and he can ambulate and no leg weakness he can be discharged Awaiting results at this time    Ripley Fraise, MD 12/07/16 [phone]

## 2016-12-07 NOTE — ED Notes (Signed)
Placed aspen collar on pt and pt ambulated to restroom. Pt used walker and ambulated with a steady gait, pt denies any pain or dizziness.

## 2016-12-07 NOTE — Discharge Instructions (Addendum)
Your tests today show that you have some dehydration of your kidneys, we have given you some fluids to help but you will need to continue to drink lots of fluids for the next few days  Please have your doctor recheck your kidney tests in the next week to make sure that they are doing better.  You are NOT to climb stairs and you should have someone walk with you when you are moving around the house this week.  ER for severe or worsening symptoms  Continue your home medicines except you should hold your lisinopril until seen by your primary doctor

## 2016-12-07 NOTE — ED Provider Notes (Signed)
MR imaging reviewed by neurosurgery dr pool Discussed exam findings that were found earlier Pt awake/alert, maex4, no focal weakness He is ambulating at baseline He has reviewed MRI He does not feel admission/surgery warranted Pt will be discharged He was informed of pulmonary nodule and need for CT scan and that this might be cancer Advised to use aspen collar if he has any neck pain due to fall    Ripley Fraise, MD 12/07/16 [phone]

## 2016-12-07 NOTE — ED Notes (Signed)
Patient returned from MRI.

## 2016-12-24 ENCOUNTER — Inpatient Hospital Stay (HOSPITAL_COMMUNITY): Payer: Non-veteran care

## 2016-12-24 ENCOUNTER — Inpatient Hospital Stay (HOSPITAL_COMMUNITY)
Admission: EM | Admit: 2016-12-24 | Discharge: 2016-12-28 | DRG: 871 | Disposition: A | Discharge status: Home / Self Care | Payer: Non-veteran care | Attending: Internal Medicine | Admitting: Internal Medicine

## 2016-12-24 ENCOUNTER — Encounter (HOSPITAL_COMMUNITY): Payer: Self-pay | Admitting: Emergency Medicine

## 2016-12-24 ENCOUNTER — Emergency Department (HOSPITAL_COMMUNITY): Payer: Non-veteran care

## 2016-12-24 DIAGNOSIS — E860 Dehydration: Secondary | ICD-10-CM | POA: Diagnosis present

## 2016-12-24 DIAGNOSIS — R680 Hypothermia, not associated with low environmental temperature: Secondary | ICD-10-CM | POA: Diagnosis present

## 2016-12-24 DIAGNOSIS — R9431 Abnormal electrocardiogram [ECG] [EKG]: Secondary | ICD-10-CM | POA: Diagnosis not present

## 2016-12-24 DIAGNOSIS — K219 Gastro-esophageal reflux disease without esophagitis: Secondary | ICD-10-CM | POA: Diagnosis not present

## 2016-12-24 DIAGNOSIS — A419 Sepsis, unspecified organism: Secondary | ICD-10-CM | POA: Diagnosis not present

## 2016-12-24 DIAGNOSIS — G43A0 Cyclical vomiting, not intractable: Secondary | ICD-10-CM | POA: Diagnosis not present

## 2016-12-24 DIAGNOSIS — E785 Hyperlipidemia, unspecified: Secondary | ICD-10-CM | POA: Diagnosis present

## 2016-12-24 DIAGNOSIS — I440 Atrioventricular block, first degree: Secondary | ICD-10-CM | POA: Diagnosis present

## 2016-12-24 DIAGNOSIS — R110 Nausea: Secondary | ICD-10-CM

## 2016-12-24 DIAGNOSIS — I2510 Atherosclerotic heart disease of native coronary artery without angina pectoris: Secondary | ICD-10-CM | POA: Diagnosis present

## 2016-12-24 DIAGNOSIS — F4310 Post-traumatic stress disorder, unspecified: Secondary | ICD-10-CM | POA: Diagnosis present

## 2016-12-24 DIAGNOSIS — I10 Essential (primary) hypertension: Secondary | ICD-10-CM | POA: Diagnosis not present

## 2016-12-24 DIAGNOSIS — I959 Hypotension, unspecified: Secondary | ICD-10-CM | POA: Diagnosis present

## 2016-12-24 DIAGNOSIS — Z79899 Other long term (current) drug therapy: Secondary | ICD-10-CM

## 2016-12-24 DIAGNOSIS — I4892 Unspecified atrial flutter: Secondary | ICD-10-CM | POA: Diagnosis present

## 2016-12-24 DIAGNOSIS — E872 Acidosis: Secondary | ICD-10-CM | POA: Diagnosis present

## 2016-12-24 DIAGNOSIS — Z87891 Personal history of nicotine dependence: Secondary | ICD-10-CM

## 2016-12-24 DIAGNOSIS — Z7982 Long term (current) use of aspirin: Secondary | ICD-10-CM

## 2016-12-24 DIAGNOSIS — N179 Acute kidney failure, unspecified: Secondary | ICD-10-CM

## 2016-12-24 DIAGNOSIS — E440 Moderate protein-calorie malnutrition: Secondary | ICD-10-CM | POA: Diagnosis present

## 2016-12-24 DIAGNOSIS — I160 Hypertensive urgency: Secondary | ICD-10-CM | POA: Diagnosis not present

## 2016-12-24 DIAGNOSIS — Z8249 Family history of ischemic heart disease and other diseases of the circulatory system: Secondary | ICD-10-CM

## 2016-12-24 DIAGNOSIS — K631 Perforation of intestine (nontraumatic): Secondary | ICD-10-CM | POA: Diagnosis present

## 2016-12-24 DIAGNOSIS — Z8673 Personal history of transient ischemic attack (TIA), and cerebral infarction without residual deficits: Secondary | ICD-10-CM

## 2016-12-24 DIAGNOSIS — R112 Nausea with vomiting, unspecified: Secondary | ICD-10-CM

## 2016-12-24 DIAGNOSIS — R531 Weakness: Secondary | ICD-10-CM | POA: Diagnosis not present

## 2016-12-24 DIAGNOSIS — F319 Bipolar disorder, unspecified: Secondary | ICD-10-CM | POA: Diagnosis not present

## 2016-12-24 DIAGNOSIS — R911 Solitary pulmonary nodule: Secondary | ICD-10-CM | POA: Diagnosis present

## 2016-12-24 DIAGNOSIS — N183 Chronic kidney disease, stage 3 (moderate): Secondary | ICD-10-CM | POA: Diagnosis present

## 2016-12-24 DIAGNOSIS — Z880 Allergy status to penicillin: Secondary | ICD-10-CM

## 2016-12-24 DIAGNOSIS — G8929 Other chronic pain: Secondary | ICD-10-CM | POA: Diagnosis present

## 2016-12-24 DIAGNOSIS — Z6830 Body mass index (BMI) 30.0-30.9, adult: Secondary | ICD-10-CM

## 2016-12-24 DIAGNOSIS — N400 Enlarged prostate without lower urinary tract symptoms: Secondary | ICD-10-CM | POA: Diagnosis present

## 2016-12-24 DIAGNOSIS — R000 Tachycardia, unspecified: Secondary | ICD-10-CM | POA: Diagnosis present

## 2016-12-24 DIAGNOSIS — Z8546 Personal history of malignant neoplasm of prostate: Secondary | ICD-10-CM | POA: Diagnosis not present

## 2016-12-24 LAB — I-STAT CHEM 8, ED
BUN: 38 mg/dL — AB (ref 6–20)
CHLORIDE: 101 mmol/L (ref 101–111)
Calcium, Ion: 1.1 mmol/L — ABNORMAL LOW (ref 1.15–1.40)
Creatinine, Ser: 4.7 mg/dL — ABNORMAL HIGH (ref 0.61–1.24)
Glucose, Bld: 109 mg/dL — ABNORMAL HIGH (ref 65–99)
HCT: 38 % — ABNORMAL LOW (ref 39.0–52.0)
Hemoglobin: 12.9 g/dL — ABNORMAL LOW (ref 13.0–17.0)
Potassium: 3.9 mmol/L (ref 3.5–5.1)
SODIUM: 141 mmol/L (ref 135–145)
TCO2: 24 mmol/L (ref 0–100)

## 2016-12-24 LAB — CBC
HCT: 38.3 % — ABNORMAL LOW (ref 39.0–52.0)
HEMATOCRIT: 37 % — AB (ref 39.0–52.0)
HEMOGLOBIN: 11.9 g/dL — AB (ref 13.0–17.0)
Hemoglobin: 12.1 g/dL — ABNORMAL LOW (ref 13.0–17.0)
MCH: 29.6 pg (ref 26.0–34.0)
MCH: 29.9 pg (ref 26.0–34.0)
MCHC: 31.6 g/dL (ref 30.0–36.0)
MCHC: 32.2 g/dL (ref 30.0–36.0)
MCV: 93.6 fL (ref 78.0–100.0)
MCV: 93 fL (ref 78.0–100.0)
Platelets: 275 10*3/uL (ref 150–400)
Platelets: 284 10*3/uL (ref 150–400)
RBC: 4.09 MIL/uL — AB (ref 4.22–5.81)
RBC: 3.98 MIL/uL — AB (ref 4.22–5.81)
RDW: 13.5 % (ref 11.5–15.5)
RDW: 13.4 % (ref 11.5–15.5)
WBC: 14.3 10*3/uL — AB (ref 4.0–10.5)
WBC: 15.3 10*3/uL — ABNORMAL HIGH (ref 4.0–10.5)

## 2016-12-24 LAB — URINALYSIS, ROUTINE W REFLEX MICROSCOPIC
BILIRUBIN URINE: NEGATIVE
Glucose, UA: NEGATIVE mg/dL
KETONES UR: NEGATIVE mg/dL
Leukocytes, UA: NEGATIVE
Nitrite: NEGATIVE
Protein, ur: 30 mg/dL — AB
SPECIFIC GRAVITY, URINE: 1.014 (ref 1.005–1.030)
pH: 5 (ref 5.0–8.0)

## 2016-12-24 LAB — I-STAT CG4 LACTIC ACID, ED: Lactic Acid, Venous: 2.79 mmol/L (ref 0.5–1.9)

## 2016-12-24 LAB — COMPREHENSIVE METABOLIC PANEL
ALT: 16 U/L — AB (ref 17–63)
AST: 20 U/L (ref 15–41)
Albumin: 4 g/dL (ref 3.5–5.0)
Alkaline Phosphatase: 99 U/L (ref 38–126)
Anion gap: 15 (ref 5–15)
BUN: 37 mg/dL — AB (ref 6–20)
CHLORIDE: 103 mmol/L (ref 101–111)
CO2: 23 mmol/L (ref 22–32)
CREATININE: 4.72 mg/dL — AB (ref 0.61–1.24)
Calcium: 8.4 mg/dL — ABNORMAL LOW (ref 8.9–10.3)
GFR calc non Af Amer: 11 mL/min — ABNORMAL LOW (ref >60)
GFR, EST AFRICAN AMERICAN: 13 mL/min — AB (ref >60)
GLUCOSE: 115 mg/dL — AB (ref 65–99)
Potassium: 3.9 mmol/L (ref 3.5–5.1)
SODIUM: 141 mmol/L (ref 135–145)
Total Bilirubin: 0.2 mg/dL — ABNORMAL LOW (ref 0.3–1.2)
Total Protein: 6.4 g/dL — ABNORMAL LOW (ref 6.5–8.1)

## 2016-12-24 LAB — I-STAT VENOUS BLOOD GAS, ED
ACID-BASE DEFICIT: 4 mmol/L — AB (ref 0.0–2.0)
BICARBONATE: 22.8 mmol/L (ref 20.0–28.0)
O2 SAT: 31 %
PO2 VEN: 22 mmHg — AB (ref 32.0–45.0)
TCO2: 24 mmol/L (ref 0–100)
pCO2, Ven: 49.6 mmHg (ref 44.0–60.0)
pH, Ven: 7.27 (ref 7.250–7.430)

## 2016-12-24 LAB — I-STAT TROPONIN, ED: Troponin i, poc: 0.01 ng/mL (ref 0.00–0.08)

## 2016-12-24 LAB — MAGNESIUM: Magnesium: 1.8 mg/dL (ref 1.7–2.4)

## 2016-12-24 LAB — CREATININE, SERUM
Creatinine, Ser: 4.16 mg/dL — ABNORMAL HIGH (ref 0.61–1.24)
GFR, EST AFRICAN AMERICAN: 15 mL/min — AB (ref >60)
GFR, EST NON AFRICAN AMERICAN: 13 mL/min — AB (ref >60)

## 2016-12-24 LAB — DIFFERENTIAL
BASOS ABS: 0 10*3/uL (ref 0.0–0.1)
BASOS PCT: 0 %
Eosinophils Absolute: 0 10*3/uL (ref 0.0–0.7)
Eosinophils Relative: 0 %
Lymphocytes Relative: 6 %
Lymphs Abs: 0.8 10*3/uL (ref 0.7–4.0)
Monocytes Absolute: 0.6 10*3/uL (ref 0.1–1.0)
Monocytes Relative: 4 %
NEUTROS ABS: 12.8 10*3/uL — AB (ref 1.7–7.7)
NEUTROS PCT: 90 %

## 2016-12-24 LAB — RAPID URINE DRUG SCREEN, HOSP PERFORMED
AMPHETAMINES: NOT DETECTED
BENZODIAZEPINES: NOT DETECTED
Barbiturates: NOT DETECTED
Cocaine: NOT DETECTED
Opiates: NOT DETECTED
Tetrahydrocannabinol: NOT DETECTED

## 2016-12-24 LAB — ETHANOL

## 2016-12-24 LAB — PHOSPHORUS: Phosphorus: 5.3 mg/dL — ABNORMAL HIGH (ref 2.5–4.6)

## 2016-12-24 LAB — PROTIME-INR
INR: 1.05
Prothrombin Time: 13.8 seconds (ref 11.4–15.2)

## 2016-12-24 LAB — APTT: APTT: 25 s (ref 24–36)

## 2016-12-24 LAB — LACTIC ACID, PLASMA
LACTIC ACID, VENOUS: 0.7 mmol/L (ref 0.5–1.9)
Lactic Acid, Venous: 1.1 mmol/L (ref 0.5–1.9)

## 2016-12-24 LAB — SODIUM, URINE, RANDOM: Sodium, Ur: 55 mmol/L

## 2016-12-24 LAB — TYPE AND SCREEN
ABO/RH(D): O POS
ANTIBODY SCREEN: NEGATIVE

## 2016-12-24 LAB — CREATININE, URINE, RANDOM: CREATININE, URINE: 141.15 mg/dL

## 2016-12-24 LAB — MRSA PCR SCREENING: MRSA by PCR: NEGATIVE

## 2016-12-24 LAB — GLUCOSE, CAPILLARY: GLUCOSE-CAPILLARY: 110 mg/dL — AB (ref 65–99)

## 2016-12-24 MED ORDER — ONDANSETRON HCL 4 MG/2ML IJ SOLN
4.0000 mg | Freq: Four times a day (QID) | INTRAMUSCULAR | Status: DC | PRN
  Administered 2016-12-26: 4 mg via INTRAVENOUS
  Filled 2016-12-24: qty 2

## 2016-12-24 MED ORDER — ONDANSETRON HCL 4 MG PO TABS
4.0000 mg | ORAL_TABLET | Freq: Four times a day (QID) | ORAL | Status: DC | PRN
  Administered 2016-12-26: 4 mg via ORAL
  Filled 2016-12-24: qty 1

## 2016-12-24 MED ORDER — LAMOTRIGINE 100 MG PO TABS
100.0000 mg | ORAL_TABLET | Freq: Every day | ORAL | Status: DC
  Administered 2016-12-24 – 2016-12-27 (×3): 100 mg via ORAL
  Filled 2016-12-24 (×3): qty 1

## 2016-12-24 MED ORDER — SODIUM CHLORIDE 0.9 % IV BOLUS (SEPSIS)
1000.0000 mL | Freq: Once | INTRAVENOUS | Status: AC
  Administered 2016-12-24: 1000 mL via INTRAVENOUS

## 2016-12-24 MED ORDER — PANTOPRAZOLE SODIUM 40 MG PO TBEC
80.0000 mg | DELAYED_RELEASE_TABLET | Freq: Every day | ORAL | Status: DC
  Administered 2016-12-24 – 2016-12-26 (×3): 80 mg via ORAL
  Filled 2016-12-24 (×3): qty 2

## 2016-12-24 MED ORDER — SODIUM CHLORIDE 0.9% FLUSH
3.0000 mL | Freq: Two times a day (BID) | INTRAVENOUS | Status: DC
  Administered 2016-12-24 – 2016-12-28 (×8): 3 mL via INTRAVENOUS

## 2016-12-24 MED ORDER — SODIUM CHLORIDE 0.9 % IV BOLUS (SEPSIS): 500.0000 mL | Freq: Once | INTRAVENOUS | Status: AC

## 2016-12-24 MED ORDER — SODIUM CHLORIDE 0.9 % IV SOLN
1.0000 g | Freq: Once | INTRAVENOUS | Status: AC
  Administered 2016-12-24: 1 g via INTRAVENOUS
  Filled 2016-12-24: qty 10

## 2016-12-24 MED ORDER — ASPIRIN 325 MG PO TABS
325.0000 mg | ORAL_TABLET | Freq: Every day | ORAL | Status: DC
  Administered 2016-12-24 – 2016-12-25 (×2): 325 mg via ORAL
  Filled 2016-12-24 (×2): qty 1

## 2016-12-24 MED ORDER — ATORVASTATIN CALCIUM 40 MG PO TABS
40.0000 mg | ORAL_TABLET | Freq: Every day | ORAL | Status: DC
  Administered 2016-12-24 – 2016-12-27 (×3): 40 mg via ORAL
  Filled 2016-12-24 (×3): qty 1

## 2016-12-24 MED ORDER — ACETAMINOPHEN 650 MG RE SUPP: 650.0000 mg | Freq: Four times a day (QID) | RECTAL | Status: DC | PRN

## 2016-12-24 MED ORDER — SODIUM CHLORIDE 0.9 % IV SOLN
INTRAVENOUS | Status: AC
  Administered 2016-12-24: 11:00:00 via INTRAVENOUS

## 2016-12-24 MED ORDER — ACETAMINOPHEN 325 MG PO TABS
650.0000 mg | ORAL_TABLET | Freq: Four times a day (QID) | ORAL | Status: DC | PRN
  Administered 2016-12-26 – 2016-12-27 (×3): 650 mg via ORAL
  Filled 2016-12-24 (×3): qty 2

## 2016-12-24 MED ORDER — SODIUM CHLORIDE 0.9 % IV BOLUS (SEPSIS)
2000.0000 mL | Freq: Once | INTRAVENOUS | Status: DC
  Administered 2016-12-24: 2000 mL via INTRAVENOUS

## 2016-12-24 MED ORDER — VANCOMYCIN HCL IN DEXTROSE 1-5 GM/200ML-% IV SOLN
1000.0000 mg | Freq: Once | INTRAVENOUS | Status: AC
  Administered 2016-12-24: 1000 mg via INTRAVENOUS
  Filled 2016-12-24: qty 200

## 2016-12-24 MED ORDER — DEXTROSE 5 % IV SOLN
1.0000 g | INTRAVENOUS | Status: DC
  Administered 2016-12-24 – 2016-12-25 (×2): 1 g via INTRAVENOUS
  Filled 2016-12-24 (×3): qty 1

## 2016-12-24 MED ORDER — PRAZOSIN HCL 2 MG PO CAPS
3.0000 mg | ORAL_CAPSULE | Freq: Every day | ORAL | Status: DC
  Administered 2016-12-24 – 2016-12-27 (×3): 3 mg via ORAL
  Filled 2016-12-24 (×4): qty 1

## 2016-12-24 MED ORDER — HEPARIN SODIUM (PORCINE) 5000 UNIT/ML IJ SOLN
5000.0000 [IU] | Freq: Three times a day (TID) | INTRAMUSCULAR | Status: DC
  Administered 2016-12-24 – 2016-12-28 (×11): 5000 [IU] via SUBCUTANEOUS
  Filled 2016-12-24 (×9): qty 1

## 2016-12-24 MED ORDER — FLUOXETINE HCL 20 MG PO CAPS
60.0000 mg | ORAL_CAPSULE | Freq: Every day | ORAL | Status: DC
  Administered 2016-12-24 – 2016-12-28 (×5): 60 mg via ORAL
  Filled 2016-12-24 (×5): qty 3

## 2016-12-24 MED ORDER — VANCOMYCIN HCL IN DEXTROSE 1-5 GM/200ML-% IV SOLN: 1000.0000 mg | INTRAVENOUS | Status: DC

## 2016-12-24 MED ORDER — MIRTAZAPINE 30 MG PO TABS
45.0000 mg | ORAL_TABLET | Freq: Every day | ORAL | Status: DC
  Administered 2016-12-24 – 2016-12-27 (×3): 45 mg via ORAL
  Filled 2016-12-24 (×3): qty 1
  Filled 2016-12-24: qty 3
  Filled 2016-12-24: qty 1

## 2016-12-24 MED ORDER — HYDRALAZINE HCL 20 MG/ML IJ SOLN
10.0000 mg | Freq: Three times a day (TID) | INTRAMUSCULAR | Status: DC | PRN
  Administered 2016-12-28: 10 mg via INTRAVENOUS
  Filled 2016-12-24: qty 1

## 2016-12-24 NOTE — ED Notes (Signed)
Attempted report x2.  Was told all RNs are in report.

## 2016-12-24 NOTE — ED Notes (Signed)
Pt placed on bair hugger at this time, will cont. To moniter pts temp.

## 2016-12-24 NOTE — ED Notes (Signed)
Attempted to call report.  No answer on unit.

## 2016-12-24 NOTE — H&P (Signed)
[facility] History and Physical  [name] OZH:[id number] DOB: 05/26/1946 DOA: 12/24/2016  Referring physician:  PCP: Verline Lema, MD   Chief Complaint: "Took all of my strength to call 911."  HPI: Jason [name] is a 71 y.o. male  past medical history of chronic kidney disease, hypertension, hyperlipidemia, stroke, PTSD, bipolar 1, anemia and reflux presents to emergency room with chief complaint of sudden weakness. Patient states that he's had feelings of hot and cold for several weeks. Recently hospitalized and discharged less than 2 weeks ago. Came to the emergency room via EMS today due to extreme weakness. This started around 11 PM. Patient became very diaphoretic followed by feelings of extreme chills and feverishness. This was on for hours at 2:30 AM patient was so weak he could barely move. He was able to reach his phone and down 911 and EMS arrived.  ED course: In the emergency room patient was found to be febrile and hypotensive. He was placed on a bear hugger to his body temperature came back up. Bolused 3 L of normal saline. Started on sepsis protocol. CT of the head negative. Hospitalist consulted for admission.   Review of Systems:  As per HPI otherwise 10 point review of systems negative.    Past Medical History:  Diagnosis Date  . Acid reflux disease with ulcer   . AKI (acute kidney injury) ([place])   . Anemia   . Arthritis   . Bipolar 1 disorder ([place])   . Carotid stenosis   . Chronic back pain   . Complication of anesthesia 2000   reaction to anesthesia had some swelling   . Depression   . GERD (gastroesophageal reflux disease)   . Hearing loss   . Hyperlipidemia   . Hypertension   . Prostate cancer [facility])    prostate cancer  . PTSD (post-traumatic stress disorder)   . Stroke ([place]) 08/2015  . TIA (transient ischemic attack)    has had several mini strokes   Past Surgical History:  Procedure Laterality Date  . BACK SURGERY    . CARDIAC  CATHETERIZATION     cant remember when  . COLONOSCOPY N/A 10/16/2015   Procedure: COLONOSCOPY;  Surgeon: [name], MD;  Location: [facility] ENDOSCOPY;  Service: Gastroenterology;  Laterality: N/A;  . ESOPHAGOGASTRODUODENOSCOPY N/A 10/15/2015   Procedure: ESOPHAGOGASTRODUODENOSCOPY (EGD);  Surgeon: [name], MD;  Location: [place];  Service: Gastroenterology;  Laterality: N/A;  . EYE SURGERY     cataracts  . GIVENS CAPSULE STUDY N/A 10/16/2015   Procedure: GIVENS CAPSULE STUDY;  Surgeon: [name], MD;  Location: [place];  Service: Gastroenterology;  Laterality: N/A;  . PROSTATE SURGERY     Social History:  reports that he quit smoking about 13 years ago. He has never used smokeless tobacco. He reports that he does not drink alcohol or use drugs.  Allergies  Allergen Reactions  . Penicillins Other (See Comments)    Tested positive for this Has patient had a PCN reaction causing immediate rash, facial/tongue/throat swelling, SOB or lightheadedness with hypotension: Unknown Has patient had a PCN reaction causing severe rash involving mucus membranes or skin necrosis: Unknown Has patient had a PCN reaction that required hospitalization: Unknown Has patient had a PCN reaction occurring within the last 10 years: No If all of the above answers are "NO", then may proceed with Cephalosporin use.     Family History  Problem Relation Age of Onset  . Stroke Mother  Hemorrhagic stroke  . Cancer Father        Pancreatic cancer  . Hypertension Father      Prior to Admission medications   Medication Sig Start Date End Date Taking? Authorizing Provider  aspirin 325 MG tablet Take 325 mg by mouth daily.    [provider]  atorvastatin (LIPITOR) 40 MG tablet Take 1 tablet (40 mg total) by mouth daily at 6 PM. Patient taking differently: Take 40 mg by mouth at bedtime.  04/14/14   Mikhail, Velta Addison, DO  Calcium Carbonate (CALCIUM 600 PO)  Take 2 tablets by mouth daily.    [provider]  Carboxymethylcellulose Sodium (THERATEARS OP) Place 1 drop into both eyes 4 (four) times daily.    [provider]  Cholecalciferol (VITAMIN D-3) 1000 units CAPS Take 1,000 Units by mouth daily.    [provider]  ferrous sulfate 325 (65 FE) MG tablet Take 1 tablet (325 mg total) by mouth 2 (two) times daily with a meal. Patient taking differently: Take 650 mg by mouth daily.  10/17/15   Robbie Lis, MD  FLUoxetine (PROZAC) 20 MG capsule Take 60 mg by mouth daily.    [provider]  gabapentin (NEURONTIN) 600 MG tablet Take 1,200 mg by mouth at bedtime.    [provider]  Glucosamine Sulfate 1000 MG CAPS Take 2,000 mg by mouth daily.    [provider]  lamoTRIgine (LAMICTAL) 25 MG tablet Take 100 mg by mouth at bedtime.     [provider]  MENTHOL-METHYL SALICYLATE EX Apply 1 application topically See admin instructions. TO AFFECTED AREA(S) THREE TIMES A DAY AS NEEDED FOR PAIN    [provider]  mirtazapine (REMERON) 45 MG tablet Take 45 mg by mouth at bedtime.    [provider]  Omega-3 Fatty Acids (FISH OIL) 1000 MG CAPS Take 2,000 mg by mouth at bedtime.     [provider]  omeprazole (PRILOSEC) 20 MG capsule Take 20 mg by mouth 2 (two) times daily before a meal.    [provider]  Oxycodone HCl 10 MG TABS Take 10 mg by mouth 3 (three) times daily as needed (for pain).    [provider]  oxymetazoline (AFRIN) 0.05 % nasal spray Place 1-2 sprays into both nostrils 2 (two) times daily as needed for congestion.    [provider]  pantoprazole (PROTONIX) 40 MG tablet Take 1 tablet (40 mg total) by mouth daily at 6 (six) AM. Patient not taking: Reported on 12/06/2016 10/17/15   Robbie Lis, MD  prazosin (MINIPRESS) 1 MG capsule Take 3 mg by mouth at bedtime.    [provider]   Physical Exam: Vitals:    12/24/16 0630 12/24/16 0645 12/24/16 0700 12/24/16 0715  BP: 102/90 101/70 122/72 118/73  Pulse: 99 97 100 (!) 103  Resp: 19 (!) 21 15 16   Temp:      TempSrc:      SpO2: 97% 96% 96% 96%  Weight:      Height:        Wt Readings from Last 3 Encounters:  12/24/16 79.4 kg (175 lb)  12/06/16 79.4 kg (175 lb 0.7 oz)  10/29/16 83 kg (182 lb 15.7 oz)    General:  Appears calm and comfortable; A&Ox3, appears tired Eyes:  PERRL, EOMI, normal lids, iris ENT:  grossly normal hearing, lips & tongue Neck:  no LAD, masses or thyromegaly Cardiovascular:  RRR, no m/r/g. No LE  edema.  Respiratory:  CTA bilaterally, no w/r/r. Normal respiratory effort. Abdomen:  soft, ntnd Skin:  no rash or induration seen on limited exam Musculoskeletal:  grossly normal tone BUE/BLE Psychiatric:  grossly normal mood and affect, speech fluent and appropriate; no SI/HI Neurologic:  CN 2-12 grossly intact, moves all extremities in coordinated fashion.          Labs on Admission:  Basic Metabolic Panel:  Recent Labs Lab 12/24/16 0515 12/24/16 0516  NA 141 141  K 3.9 3.9  CL 103 101  CO2 23  --   GLUCOSE 115* 109*  BUN 37* 38*  CREATININE 4.72* 4.70*  CALCIUM 8.4*  --    Liver Function Tests:  Recent Labs Lab 12/24/16 0515  AST 20  ALT 16*  ALKPHOS 99  BILITOT 0.2*  PROT 6.4*  ALBUMIN 4.0   No results for input(s): LIPASE, AMYLASE in the last 168 hours. No results for input(s): AMMONIA in the last 168 hours. CBC:  Recent Labs Lab 12/24/16 0515 12/24/16 0516  WBC 14.3*  --   NEUTROABS 12.8*  --   HGB 12.1* 12.9*  HCT 38.3* 38.0*  MCV 93.6  --   PLT 275  --    Cardiac Enzymes: No results for input(s): CKTOTAL, CKMB, CKMBINDEX, TROPONINI in the last 168 hours.  BNP (last 3 results) No results for input(s): BNP in the last 8760 hours.  ProBNP (last 3 results) No results for input(s): PROBNP in the last 8760 hours.   Serum creatinine: 4.7 mg/dL (H) 12/24/16 0516 Estimated  creatinine clearance: 14.1 mL/min (A)  CBG: No results for input(s): GLUCAP in the last 168 hours.  Radiological Exams on Admission: No results found.  EKG: Independently reviewed. Normal sinus rhythm no stemi  Assessment/Plan Principal Problem:   Sepsis ([place]) Active Problems:   Bipolar 1 disorder (H[facility])   HTN (hypertension)   GERD (gastroesophageal reflux disease)   AKI (acute kidney injury) ([place])   Sepsis 2/2 unk Patient hemodynamically stable Given Vancomycin emergency room, will continue Given cefepime in the emergency room, will continue Blood cultures 2 pending Patient given 3000 mL of fluid in the emergency room Lactic acid elevated, will trend UDS pending Urinalysis pending Urine culture pending ETOH neg  Hypothermia, resolved When necessary bear hugger Likely due to sepsis  Hypocalcemia icalcium 1.10 admission Replace IV  AKI Baseline Cr 2.34, Cr on admit 4.72 3067ml of normal saline given in the emergency room Gentle hydration overnight Checking magnesium and phosphorus Urine labs ordered to calculate fractional excretion of Na Bladder scan ordered I&O cath Ordered  Prolonged QT, mild/Arrythmia reported by [place] [facility] consulted by EDP Initial trop neg Will check and correct lytes abn  Chronic pain Hold gabapentin, oxycodone  Hypertension When necessary hydralazine 10 mg IV as needed for severe blood pressure  History stroke Continue aspirin  Bipolar 1 No SI/HI Continue Prozac, Lamictal, remeron  Reflux PPI   BPH Continue Minipress  Hyperlipidemia Continue statin   Code Status: FULL  DVT Prophylaxis: heparin Family Communication: wife at bedside Disposition Plan: Pending Improvement  Status: sdu inpt  Elwin Mocha, MD Family Medicine [facility] [email or web address] Password [email or web address]

## 2016-12-24 NOTE — Progress Notes (Addendum)
Pharmacy Antibiotic Note  Jason [name] is a 71 y.o. male admitted on 12/24/2016 with sepsis.  Pharmacy has been consulted for Vancomycin dosing. Presents to ED "feeling bad". WBC elevated. Noted to be in acute on chronic renal failure. Hypothermic.   Plan: -Vancomycin 1000 mg IV q48h -Cefepime 1g IV q24h per MD -Adjust doses as need based on renal function trend -Drug levels as indicated   Height: 5\' 4"  (162.6 cm) Weight: 175 lb (79.4 kg) IBW/kg (Calculated) : 59.2  Temp (24hrs), Avg:96.3 F (35.7 C), Min:96.3 F (35.7 C), Max:96.3 F (35.7 C)   Recent Labs Lab 12/24/16 0515 12/24/16 0516  WBC 14.3*  --   CREATININE  --  4.70*  LATICACIDVEN  --  2.79*    Estimated Creatinine Clearance: 14.1 mL/min (A) (by C-G formula based on SCr of 4.7 mg/dL (H)).    Allergies  Allergen Reactions  . Penicillins Other (See Comments)    Tested positive for this Has patient had a PCN reaction causing immediate rash, facial/tongue/throat swelling, SOB or lightheadedness with hypotension: Unknown Has patient had a PCN reaction causing severe rash involving mucus membranes or skin necrosis: Unknown Has patient had a PCN reaction that required hospitalization: Unknown Has patient had a PCN reaction occurring within the last 10 years: No If all of the above answers are "NO", then may proceed with Cephalosporin use.     Jason [name] 12/24/2016 5:33 AM

## 2016-12-24 NOTE — Consult Note (Signed)
Cardiology Consult    Patient ID: Jason [name] MRN: [id number], DOB/AGE: Sep 08, 1945   Admit date: 12/24/2016 Date of Consult: 12/24/2016  Primary Physician: System, Pcp Not In Primary Cardiologist: Branch (2015) Requesting Provider: Kathrynn Humble Reason for Consultation: Atrial Flutter  Jason [name] is a 72 y.o. male who is being seen today for the evaluation of Atrial flutter at the request of Dr. Kathrynn Humble.   Patient Profile    71 yo male with PMH of HTN, HL, Stroke, Bipolar, Anemia, CKD, and GERD who presented with sudden onset of weakness, chills and diaphoresis.  Past Medical History   Past Medical History:  Diagnosis Date  . Acid reflux disease with ulcer   . AKI (acute kidney injury) ([place])   . Anemia   . Arthritis   . Bipolar 1 disorder ([place])   . Carotid stenosis   . Chronic back pain   . Complication of anesthesia 2000   reaction to anesthesia had some swelling   . Depression   . GERD (gastroesophageal reflux disease)   . Hearing loss   . Hyperlipidemia   . Hypertension   . Prostate cancer [facility])    prostate cancer  . PTSD (post-traumatic stress disorder)   . Stroke ([place]) 08/2015  . TIA (transient ischemic attack)    has had several mini strokes    Past Surgical History:  Procedure Laterality Date  . BACK SURGERY    . CARDIAC CATHETERIZATION     cant remember when  . COLONOSCOPY N/A 10/16/2015   Procedure: COLONOSCOPY;  Surgeon: [name], MD;  Location[facility] ENDOSCOPY;  Service: Gastroenterology;  Laterality: N/A;  . ESOPHAGOGASTRODUODENOSCOPY N/A 10/15/2015   Procedure: ESOPHAGOGASTRODUODENOSCOPY (EGD);  Surgeon: [name], MD;  Location: [place];  Service: Gastroenterology;  Laterality: N/A;  . EYE SURGERY     cataracts  . GIVENS CAPSULE STUDY N/A 10/16/2015   Procedure: GIVENS CAPSULE STUDY;  Surgeon: [name], MD;  Location: [place];  Service: Gastroenterology;  Laterality: N/A;  . PROSTATE SURGERY         Allergies  Allergies  Allergen Reactions  . Penicillins Other (See Comments)    Tested positive for this Has patient had a PCN reaction causing immediate rash, facial/tongue/throat swelling, SOB or lightheadedness with hypotension: Unknown Has patient had a PCN reaction causing severe rash involving mucus membranes or skin necrosis: Unknown Has patient had a PCN reaction that required hospitalization: Unknown Has patient had a PCN reaction occurring within the last 10 years: No If all of the above answers are "NO", then may proceed with Cephalosporin use.     History of Present Illness    [name] is a 71 year old male with past medical history of HTN, HL, Stroke, Bipolar, Anemia, CKD, and GERD. He was seen briefly by Dr. branch back in 11/15 for generalized weakness after recent hospital admission, and noted to have heart rates ranging between 50-110s that were irregular. No diagnosis of specific arrhythmia was noted.   Presented to the hospital on 12/24/16 with reports of sudden onset of weakness, diaphoresis and chills that started last evening. Reports he was recently hospitalized prior to this episode and discharged about 2 weeks prior. States he was sitting in his office whenever he developed sudden onset of diaphoresis, reported changing his shirt 3 times. States he was so weak he was unable to walk up the steps, and called for his wife. EMS was called. On arrival he was found to be febrile and hypotensive.  In the ED his labs showed stable electrolytes, creatinine of 4.72, hemoglobin 12.1, lactic acid 2.79. CT of the head was negative, rectal temperature was 96 that he was placed on a bear hugger with rise in temperature. Chest x-ray was negative Sepsis protocol was initiated and he was given 3 L of normal saline with improvement in pressure. Initial EKG showed sinus rhythm with first-degree AV block. Subsequent EKG showed question for atrial flutter. He was admitted and  started on IV antibiotics. He denies any chest pain, palpitations, dizziness, lightheadedness, or nausea or vomiting. Denies any history of arrhythmias. Cardiology was consult by the ED physician in regards to conserve her atrial flutter on EKG.  Inpatient Medications    . aspirin  325 mg Oral Daily  . atorvastatin  40 mg Oral QHS  . FLUoxetine  60 mg Oral Daily  . heparin  5,000 Units Subcutaneous Q8H  . lamoTRIgine  100 mg Oral QHS  . mirtazapine  45 mg Oral QHS  . pantoprazole  80 mg Oral Daily  . prazosin  3 mg Oral QHS  . sodium chloride flush  3 mL Intravenous Q12H    Family History    Family History  Problem Relation Age of Onset  . Stroke Mother        Hemorrhagic stroke  . Cancer Father        Pancreatic cancer  . Hypertension Father     Social History    Social History   Social History  . Marital status: Married    Spouse name: N/A  . Number of children: N/A  . Years of education: N/A   Occupational History  . Not on file.   Social History Main Topics  . Smoking status: Former Smoker    Quit date: 03/06/2003  . Smokeless tobacco: Never Used  . Alcohol use No     Comment: drank years ago  . Drug use: No  . Sexual activity: No   Other Topics Concern  . Not on file   Social History Narrative  . No narrative on file     Review of Systems    See HPI  All other systems reviewed and are otherwise negative except as noted above.  Physical Exam    Blood pressure 133/67, pulse 94, temperature 98.3 F (36.8 C), temperature source Oral, resp. rate 11, height 5\' 4"  (1.626 m), weight 175 lb (79.4 kg), SpO2 97 %.  General: Pleasant older WM, NAD, flushed Psych: Normal affect. Neuro: Alert and oriented X 3. Moves all extremities spontaneously. HEENT: Normal  Neck: Supple without bruits or JVD. Lungs:  Resp regular and unlabored, CTA. Heart: tachy, no s3, s4, or murmurs. Abdomen: Soft, non-tender, non-distended, BS + x 4.  Extremities: No clubbing,  cyanosis or edema. DP/PT/Radials 2+ and equal bilaterally.  Labs    Troponin [facility] Test)  Recent Labs  12/24/16 0514  TROPIPOC 0.01   No results for input(s): CKTOTAL, CKMB, TROPONINI in the last 72 hours. Lab Results  Component Value Date   WBC 15.3 (H) 12/24/2016   HGB 11.9 (L) 12/24/2016   HCT 37.0 (L) 12/24/2016   MCV 93.0 12/24/2016   PLT 284 12/24/2016    Recent Labs Lab 12/24/16 0515 12/24/16 0516 12/24/16 0813  NA 141 141  --   K 3.9 3.9  --   CL 103 101  --   CO2 23  --   --   BUN 37* 38*  --   CREATININE 4.72*  4.70* 4.16*  CALCIUM 8.4*  --   --   PROT 6.4*  --   --   BILITOT 0.2*  --   --   ALKPHOS 99  --   --   ALT 16*  --   --   AST 20  --   --   GLUCOSE 115* 109*  --    Lab Results  Component Value Date   CHOL 206 (H) 08/31/2015   HDL 20 (L) 08/31/2015   LDLCALC 139 (H) 08/31/2015   TRIG 236 (H) 08/31/2015   No results found for: [facility]   Radiology Studies    Ct Head Wo Contrast  Result Date: 12/24/2016 CLINICAL DATA:  Confusion, sepsis, slurred speech EXAM: CT HEAD WITHOUT CONTRAST TECHNIQUE: Contiguous axial images were obtained from the base of the skull through the vertex without intravenous contrast. COMPARISON:  12/06/2016 FINDINGS: Brain: Stable brain atrophy pattern with chronic white matter microvascular ischemic changes throughout the cerebral hemispheres. Remote bilateral lacunar-type basilar ganglia infarctions and right cerebellar infarct. No acute intracranial hemorrhage, mass lesion, new infarction, focal mass effect or edema. Negative for herniation, midline shift, hydrocephalus, or extra-axial fluid collection. Cisterns are patent. Vascular: Atherosclerosis of the intracranial vessels. No hyperdense vessel demonstrated. Skull: Normal. Negative for fracture or focal lesion. Sinuses/Orbits: No acute finding. Other: None. IMPRESSION: Stable brain atrophy, chronic white matter microvascular ischemic changes and remote infarcts as  above. No interval change or acute process by noncontrast CT. Electronically Signed   By: Jerilynn Mages.  Shick M.D.   On: 12/24/2016 07:53   ECG & Cardiac Imaging    EKG: 12/24/16 (0440) SR, 12/24/16 SR with 1st degree AVB  Echo: 2/17  Study Conclusions  - Left ventricle: The cavity size was normal. Systolic function was   normal. The estimated ejection fraction was in the range of 55%   to 60%. Wall motion was normal; there were no regional wall   motion abnormalities. Doppler parameters are consistent with   abnormal left ventricular relaxation (grade 1 diastolic   dysfunction). - Mitral valve: There was mild regurgitation.  Assessment & Plan    71 yo male with PMH of HTN, HL, Stroke, Bipolar, Anemia, CKD, and GERD who presented with sudden onset of weakness, chills and diaphoresis.  1. Tachycardia: Second EKG done on admission reads atrial flutter, but appears to be sinus rhythm with first-degree AV block. In review of telemetry, there appear to be P waves present with every QRS complex, but clearly a long PR interval noted. No clear atrial fibrillation/flutter noted at this time therefore would not recommend any anticoagulation. Suspect tachycardia 2/2 elevated lactic acid and sepsis.  -- follow electrolytes, correct as needed  2. Sepsis: Was febrile and hypotensive on admission, but has since improved with IV fluids. Noted to have elevated lactic acid and WBC. -- Antibiotics per primary.  3. CKD with AKI: Baseline creatinine appears to be around 1.5-2.0. Noted to be 4.72 on admission, with some improvement with IV fluids.       Barnet Pall, NP-C Pager [phone] 12/24/2016, 11:25 AM   I have examined the patient and reviewed assessment and plan and discussed with patient.  Agree with above as stated.  I personally reviewed the telemetry and ECGs.  The ECG shows artifact at the beginning of the tracing.  I think he has been in sinus rhythm, sinus tachycardia.  He does have  carotid disease follwed at the [place].  No sx of CAD at this time.  No  further w/u at this time.  Please call with questions.   Larae Grooms

## 2016-12-24 NOTE — ED Notes (Signed)
Patient transported to CT 

## 2016-12-24 NOTE — Progress Notes (Signed)
Pt had 2.5 sec pause, Reino Bellis updated no further orders receivied

## 2016-12-24 NOTE — ED Provider Notes (Signed)
[facility] DEPT Provider Note   CSN: [id number] Arrival date & time: 12/24/16  0430     History   Chief Complaint Chief Complaint  Patient presents with  . Hypotension    HPI Jason [name] is a 71 y.o. male.  HPI Pt with hx of AKI, HTN, Stroke, TIA,  Prostate CA comes in with cc of feeling unwell. Pt reports that he started feeling unwell around 11 pm, and that he was last normal around 10 pm. Pt  Started feeling sick at that time, and feeling like he had difficulty with cognition. Pt finally got attention of his family and they called EMS. Pt is noted to be hypotensive at arrival. He denies severe headaches, chest pain, dib, palpations, abd pain, diarrhea, bloody stools. Pt did have nausea. Pt denies taking too much pain meds or BP meds. He called EMS because he felt like he was having a stroke. EMS gave 500 cc bolus NS.  Past Medical History:  Diagnosis Date  . Acid reflux disease with ulcer   . AKI (acute kidney injury) ([place])   . Anemia   . Arthritis   . Bipolar 1 disorder ([place])   . Carotid stenosis   . Chronic back pain   . Complication of anesthesia 2000   reaction to anesthesia had some swelling   . Depression   . GERD (gastroesophageal reflux disease)   . Hearing loss   . Hyperlipidemia   . Hypertension   . Prostate cancer [facility])    prostate cancer  . PTSD (post-traumatic stress disorder)   . Stroke ([place]) 08/2015  . TIA (transient ischemic attack)    has had several mini strokes    Patient Active Problem List   Diagnosis Date Noted  . Sepsis ([place]) 12/24/2016  . Acute blood loss anemia   . Melena   . Anemia 10/14/2015  . Hyperglycemia 10/14/2015  . Acid reflux disease with ulcer   . Bleeding gastrointestinal   . Symptomatic anemia   . Upper GI bleeding   . Carotid artery stenosis   . HLD (hyperlipidemia) 08/31/2015  . CVA (cerebral infarction) 08/30/2015  . Degeneration of lumbar or lumbosacral intervertebral disc 05/16/2014  . Weakness  05/03/2014  . Dysarthria 04/30/2014  . AKI (acute kidney injury) ([place]) 04/30/2014  . Hypotension 04/30/2014  . Fall 04/30/2014  . CVA (cerebral vascular accident) ([place]) 04/13/2014  . Stroke ([place]) 04/13/2014  . CAP (community acquired pneumonia) 02/13/2013  . Musculoskeletal pain 02/13/2013  . Hx of completed stroke 02/13/2013  . Bipolar 1 disorder ([place]) 02/13/2013  . Depression 02/13/2013  . HTN (hypertension) 02/13/2013  . H/O prostate cancer 02/13/2013  . Nausea with vomiting 02/13/2013  . GERD (gastroesophageal reflux disease) 02/13/2013  . Poor balance 02/13/2013    Past Surgical History:  Procedure Laterality Date  . BACK SURGERY    . CARDIAC CATHETERIZATION     cant remember when  . COLONOSCOPY N/A 10/16/2015   Procedure: COLONOSCOPY;  Surgeon: [name], MD;  Location: [facility] ENDOSCOPY;  Service: Gastroenterology;  Laterality: N/A;  . ESOPHAGOGASTRODUODENOSCOPY N/A 10/15/2015   Procedure: ESOPHAGOGASTRODUODENOSCOPY (EGD);  Surgeon: [name], MD;  Location: [place];  Service: Gastroenterology;  Laterality: N/A;  . EYE SURGERY     cataracts  . GIVENS CAPSULE STUDY N/A 10/16/2015   Procedure: GIVENS CAPSULE STUDY;  Surgeon: [name], MD;  Location: [place];  Service: Gastroenterology;  Laterality: N/A;  . PROSTATE SURGERY  Home Medications    Prior to Admission medications   Medication Sig Start Date End Date Taking? Authorizing Provider  aspirin 325 MG tablet Take 325 mg by mouth daily.    [provider]  atorvastatin (LIPITOR) 40 MG tablet Take 1 tablet (40 mg total) by mouth daily at 6 PM. Patient taking differently: Take 40 mg by mouth at bedtime.  04/14/14   Mikhail, Velta Addison, DO  Calcium Carbonate (CALCIUM 600 PO) Take 2 tablets by mouth daily.    [provider]  Carboxymethylcellulose Sodium (THERATEARS OP) Place 1 drop into both eyes 4 (four) times daily.    [provider]    Cholecalciferol (VITAMIN D-3) 1000 units CAPS Take 1,000 Units by mouth daily.    [provider]  ferrous sulfate 325 (65 FE) MG tablet Take 1 tablet (325 mg total) by mouth 2 (two) times daily with a meal. Patient taking differently: Take 650 mg by mouth daily.  10/17/15   Robbie Lis, MD  FLUoxetine (PROZAC) 20 MG capsule Take 60 mg by mouth daily.    [provider]  gabapentin (NEURONTIN) 600 MG tablet Take 1,200 mg by mouth at bedtime.    [provider]  Glucosamine Sulfate 1000 MG CAPS Take 2,000 mg by mouth daily.    [provider]  lamoTRIgine (LAMICTAL) 25 MG tablet Take 100 mg by mouth at bedtime.     [provider]  MENTHOL-METHYL SALICYLATE EX Apply 1 application topically See admin instructions. TO AFFECTED AREA(S) THREE TIMES A DAY AS NEEDED FOR PAIN    [provider]  mirtazapine (REMERON) 45 MG tablet Take 45 mg by mouth at bedtime.    [provider]  Omega-3 Fatty Acids (FISH OIL) 1000 MG CAPS Take 2,000 mg by mouth at bedtime.     [provider]  omeprazole (PRILOSEC) 20 MG capsule Take 20 mg by mouth 2 (two) times daily before a meal.    [provider]  Oxycodone HCl 10 MG TABS Take 10 mg by mouth 3 (three) times daily as needed (for pain).    [provider]  oxymetazoline (AFRIN) 0.05 % nasal spray Place 1-2 sprays into both nostrils 2 (two) times daily as needed for congestion.    [provider]  pantoprazole (PROTONIX) 40 MG tablet Take 1 tablet (40 mg total) by mouth daily at 6 (six) AM. Patient not taking: Reported on 12/06/2016 10/17/15   Robbie Lis, MD  prazosin (MINIPRESS) 1 MG capsule Take 3 mg by mouth at bedtime.    [provider]    Family History Family History  Problem Relation Age of Onset  . Stroke Mother        Hemorrhagic stroke  . Cancer Father        Pancreatic cancer  . Hypertension Father     Social History Social History   Substance Use Topics  . Smoking status: Former Smoker    Quit date: 03/06/2003  . Smokeless tobacco: Never Used  . Alcohol use No     Comment: drank years ago     Allergies   Penicillins   Review of Systems Review of Systems  All other systems reviewed and are negative.    Physical Exam Updated Vital Signs BP 102/90   Pulse 99   Temp (!) 96.3 F (35.7 C) (Rectal)   Resp 19   Ht 5\' 4"  (1.626 m)   Wt 79.4 kg (175 lb)   SpO2 97%  BMI 30.04 kg/m   Physical Exam  Constitutional: He is oriented to person, place, and time. He appears well-developed.  HENT:  Head: Atraumatic.  Neck: Neck supple.  Cardiovascular: Normal rate and regular rhythm.   Pulmonary/Chest: Effort normal.  Abdominal: Soft. He exhibits no distension. There is no tenderness.  Neurological: He is alert and oriented to person, place, and time. No cranial nerve deficit. Coordination normal.  Cerebellar exam is normal (finger to nose) Sensory exam normal for bilateral upper and lower extremities - and patient is able to discriminate between sharp and dull. Motor exam is 4+/5   Skin: Skin is warm.  Nursing note and vitals reviewed.    ED Treatments / Results  Labs (all labs ordered are listed, but only abnormal results are displayed) Labs Reviewed  CBC - Abnormal; Notable for the following:       Result Value   WBC 14.3 (*)    RBC 4.09 (*)    Hemoglobin 12.1 (*)    HCT 38.3 (*)    All other components within normal limits  DIFFERENTIAL - Abnormal; Notable for the following:    Neutro Abs 12.8 (*)    All other components within normal limits  COMPREHENSIVE METABOLIC PANEL - Abnormal; Notable for the following:    Glucose, Bld 115 (*)    BUN 37 (*)    Creatinine, Ser 4.72 (*)    Calcium 8.4 (*)    Total Protein 6.4 (*)    ALT 16 (*)    Total Bilirubin 0.2 (*)    GFR calc non Af Amer 11 (*)    GFR calc Af Amer 13 (*)    All other components within normal limits  I-STAT CHEM 8, ED -  Abnormal; Notable for the following:    BUN 38 (*)    Creatinine, Ser 4.70 (*)    Glucose, Bld 109 (*)    Calcium, Ion 1.10 (*)    Hemoglobin 12.9 (*)    HCT 38.0 (*)    All other components within normal limits  I-STAT CG4 LACTIC ACID, ED - Abnormal; Notable for the following:    Lactic Acid, Venous 2.79 (*)    All other components within normal limits  I-STAT VENOUS BLOOD GAS, ED - Abnormal; Notable for the following:    pO2, Ven 22.0 (*)    Acid-base deficit 4.0 (*)    All other components within normal limits  CULTURE, BLOOD (ROUTINE X 2)  CULTURE, BLOOD (ROUTINE X 2)  ETHANOL  PROTIME-INR  APTT  RAPID URINE DRUG SCREEN, HOSP PERFORMED  URINALYSIS, ROUTINE W REFLEX MICROSCOPIC  LACTIC ACID, PLASMA  LACTIC ACID, PLASMA  I-STAT TROPOININ, ED  TYPE AND SCREEN    EKG  EKG Interpretation  Date/Time:  Friday December 24 2016 04:40:58 EDT Ventricular Rate:  72 PR Interval:    QRS Duration: 92 QT Interval:  460 QTC Calculation: 504 R Axis:   33 Text Interpretation:  Normal sinus rhythm Prolonged QT interval No acute changes No significant change since last tracing Confirmed by Varney Biles ([id number]) on 12/24/2016 5:02:12 AM        EKG Interpretation  Date/Time:  Friday December 24 2016 05:21:11 EDT Ventricular Rate:  88 PR Interval:    QRS Duration: 114 QT Interval:  415 QTC Calculation: 503 R Axis:   37 Text Interpretation:  POSTERIOR EKG Atrial flutter IS NEW Borderline intraventricular conduction delay Prolonged QT interval NEW CHANGES AS ABOVE no signs of ischemia Confirmed by Kathrynn Humble, Luz Burcher (  [id number]) on 12/24/2016 7:14:30 AM        Radiology No results found.  Procedures Procedures (including critical care time)  CRITICAL CARE Performed by: Varney Biles   Total critical care time: 55 minutes  Critical care time was exclusive of separately billable procedures and treating other patients.  Critical care was necessary to treat or prevent imminent or  life-threatening deterioration.  Critical care was time spent personally by me on the following activities: development of treatment plan with patient and/or surrogate as well as nursing, discussions with consultants, evaluation of patient's response to treatment, examination of patient, obtaining history from patient or surrogate, ordering and performing treatments and interventions, ordering and review of laboratory studies, ordering and review of radiographic studies, pulse oximetry and re-evaluation of patient's condition.   Medications Ordered in ED Medications  ceFEPIme (MAXIPIME) 1 g in dextrose 5 % 50 mL IVPB (1 g Intravenous New Bag/Given 12/24/16 0707)  vancomycin (VANCOCIN) IVPB 1000 mg/200 mL premix (not administered)  0.9 %  sodium chloride infusion (not administered)  sodium chloride 0.9 % bolus 1,000 mL (1,000 mLs Intravenous New Bag/Given 12/24/16 0558)    And  sodium chloride 0.9 % bolus 1,000 mL (0 mLs Intravenous Stopped 12/24/16 0621)    And  sodium chloride 0.9 % bolus 500 mL (500 mLs Intravenous Given by EMS 12/24/16 0500)  vancomycin (VANCOCIN) IVPB 1000 mg/200 mL premix (0 mg Intravenous Stopped 12/24/16 0658)     Initial Impression / Assessment and Plan / ED Course  I have reviewed the triage vital signs and the nursing notes.  Pertinent labs & imaging results that were available during my care of the patient were reviewed by me and considered in my medical decision making (see chart for details).  Clinical Course as of Dec 24 716  Fri Dec 24, 2016  0718 Sepsis pathway initiated Lactic Acid, Venous: (!!) 2.79 [AN]    Clinical Course User Index [AN] Varney Biles, MD   Pt comes in with cc of weakness and vague symptoms. He was normal until late night, when he started shivering, started feeling weak and had major difficulty doing menial tasks like putting pants on and calling 911 or drinking water -because of the shaking. I considered stroke in the ddx, but pt  reports that both extremities bilaterally were not acting right, and so that makes me place stroke in the less likely category. Pt also noted to be profoundly hypotensive when he arrived, and that can lead to weakness/ dizziness etc.  History is not supportive of the cause for hypotension. Questionable sepsis given the shivers. Pt denies taking too much meds, reduced po intake or emesis, diarrhea , blood loss. He has no chest pains and the posterior ekg shows no STEMI either.  There could be new Aflutter - as per the posterior ekg.  This patients CHA2DS2-VASc Score and unadjusted Ischemic Stroke Rate (% per year) is equal to 4.8 % stroke rate/year from a score of 4  Above score calculated as 1 point each if present [CHF, HTN, DM, Vascular=MI/PAD/Aortic Plaque, Age if 65-74, or Male] Above score calculated as 2 points each if present [Age > 75, or Stroke/TIA/TE]   We will let Cardiology see him and determine aspirin and anticoagulation - it is probably better for [place] to manage the acute condition.   Final Clinical Impressions(s) / ED Diagnoses   Final diagnoses:  AKI (acute kidney injury) ([place])  Hypotension, unspecified hypotension type  Atrial flutter, unspecified type ([place])  New Prescriptions New Prescriptions   No medications on file     Varney Biles, MD 12/24/16 [phone]

## 2016-12-24 NOTE — ED Triage Notes (Signed)
BIB EMS from home.  Pt started "feeling bad" around 2300, felt he couldn't move his arms and legs; started vomiting; called EMS around 0330 after waking his family.  97/52 and HR 120, 96-99% on RA on scene.  500 cc bolus.  Wife reports pt was hypertensive on Wednesday in the 220s.  12-lead unremarkable.  18 G in L AC.  Hx arthritis (chest and shoulder pain)., back pain (scheduled for surgery in July).

## 2016-12-24 NOTE — Consult Note (Signed)
[facility] Consult Note     Date: 12/24/2016                  Patient Name:  Jason [name]  MRN: [id number]  DOB: 1945/08/23  Age / Sex: 71 y.o., male         PCP: System, Pcp Not In                 Service Requesting Consult: [facility], Dr. Aggie Moats                 Reason for Consult: Acute kidney injury superimposed on CKD            Chief Complaint: sweats and weakness  HPI: Pt is a 26M with a PMH significant for HTN, HLD, previous stroke, carotid stenosis, h/o prostate cancer, and apparent CKD who is now seen in consultation at the request of Dr. Aggie Moats for evaluation and recommendations surrounding AKI on CKD.  Pt noted chills and sweats on Tuesday but didn't really start feeling bad until the night of admission.  He soaked through 2 shirts he was sweating so much, and he felt weak.  He came to [facility] where he was noted to have an elevated WBC ct and an elevated lactate.  Creatinine was up from 2.3--> 4.7.    He was fluid rescusitated an was placed on antibiotics for presumed sepsis.  Cultures are pending.     Pt notes that his BP is "all over the place".  Sometimes it's high, sometimes low.  Sweats seem to be occurring more frequently, and he just had an ED visit about 2 weeks ago for a code stroke which was negative for new strokes.    Of note, he has a pulmonary nodule that is 10 mm in diameter noted on a CT cervical spine 12/06/16.  He is feeling a little better since he came in.    Past Medical History:  Diagnosis Date  . Acid reflux disease with ulcer   . AKI (acute kidney injury) ([place])   . Anemia   . Arthritis   . Bipolar 1 disorder ([place])   . Carotid stenosis   . Chronic back pain   . Complication of anesthesia 2000   reaction to anesthesia had some swelling   . Depression   . GERD (gastroesophageal reflux disease)   . Hearing loss   . Hyperlipidemia   . Hypertension   . Prostate cancer [facility])    prostate cancer  . PTSD (post-traumatic stress  disorder)   . Stroke ([place]) 08/2015  . TIA (transient ischemic attack)    has had several mini strokes    Past Surgical History:  Procedure Laterality Date  . BACK SURGERY    . CARDIAC CATHETERIZATION     cant remember when  . COLONOSCOPY N/A 10/16/2015   Procedure: COLONOSCOPY;  Surgeon: [name], MD;  Location: [facility] ENDOSCOPY;  Service: Gastroenterology;  Laterality: N/A;  . ESOPHAGOGASTRODUODENOSCOPY N/A 10/15/2015   Procedure: ESOPHAGOGASTRODUODENOSCOPY (EGD);  Surgeon: [name], MD;  Location: [place];  Service: Gastroenterology;  Laterality: N/A;  . EYE SURGERY     cataracts  . GIVENS CAPSULE STUDY N/A 10/16/2015   Procedure: GIVENS CAPSULE STUDY;  Surgeon: [name], MD;  Location: [place];  Service: Gastroenterology;  Laterality: N/A;  . PROSTATE SURGERY      Family History  Problem Relation Age of Onset  . Stroke Mother        Hemorrhagic  stroke  . Cancer Father        Pancreatic cancer  . Hypertension Father    Social History:  reports that he quit smoking about 13 years ago. He has never used smokeless tobacco. He reports that he does not drink alcohol or use drugs.  Allergies:  Allergies  Allergen Reactions  . Penicillins Other (See Comments)    Tested positive for this Has patient had a PCN reaction causing immediate rash, facial/tongue/throat swelling, SOB or lightheadedness with hypotension: Unknown Has patient had a PCN reaction causing severe rash involving mucus membranes or skin necrosis: Unknown Has patient had a PCN reaction that required hospitalization: Unknown Has patient had a PCN reaction occurring within the last 10 years: No If all of the above answers are "NO", then may proceed with Cephalosporin use.     Medications Prior to Admission  Medication Sig Dispense Refill  . aspirin 325 MG tablet Take 325 mg by mouth daily.    Marland Kitchen atorvastatin (LIPITOR) 40 MG tablet Take 1 tablet (40 mg total) by mouth  daily at 6 PM. (Patient taking differently: Take 40 mg by mouth at bedtime. ) 30 tablet 0  . Calcium Carbonate (CALCIUM 600 PO) Take 2 tablets by mouth daily.    . Carboxymethylcellulose Sodium (THERATEARS OP) Place 1 drop into both eyes 4 (four) times daily.    . Cholecalciferol (VITAMIN D-3) 1000 units CAPS Take 1,000 Units by mouth daily.    . ferrous sulfate 325 (65 FE) MG tablet Take 1 tablet (325 mg total) by mouth 2 (two) times daily with a meal. (Patient taking differently: Take 650 mg by mouth daily. ) 60 tablet 0  . FLUoxetine (PROZAC) 20 MG capsule Take 60 mg by mouth daily.    Marland Kitchen gabapentin (NEURONTIN) 600 MG tablet Take 1,200 mg by mouth at bedtime.    . Glucosamine Sulfate 1000 MG CAPS Take 2,000 mg by mouth daily.    Marland Kitchen lamoTRIgine (LAMICTAL) 25 MG tablet Take 100 mg by mouth at bedtime.     Marland Kitchen MENTHOL-METHYL SALICYLATE EX Apply 1 application topically See admin instructions. TO AFFECTED AREA(S) THREE TIMES A DAY AS NEEDED FOR PAIN    . mirtazapine (REMERON) 45 MG tablet Take 45 mg by mouth at bedtime.    . Omega-3 Fatty Acids (FISH OIL) 1000 MG CAPS Take 2,000 mg by mouth at bedtime.     Marland Kitchen omeprazole (PRILOSEC) 20 MG capsule Take 20 mg by mouth 2 (two) times daily before a meal.    . Oxycodone HCl 10 MG TABS Take 10 mg by mouth 3 (three) times daily as needed (for pain).    Marland Kitchen oxymetazoline (AFRIN) 0.05 % nasal spray Place 1-2 sprays into both nostrils 2 (two) times daily as needed for congestion.    . pantoprazole (PROTONIX) 40 MG tablet Take 1 tablet (40 mg total) by mouth daily at 6 (six) AM. 30 tablet 0  . prazosin (MINIPRESS) 1 MG capsule Take 3 mg by mouth at bedtime.      Results for orders placed or performed during the hospital encounter of 12/24/16 (from the past 48 hour(s))  Type and screen [place]     Status: None   Collection Time: 12/24/16  5:07 AM  Result Value Ref Range   ABO/RH(D) O POS    Antibody Screen NEG    Sample Expiration 12/27/2016    I-stat troponin, ED (not at [facility], [facility])     Status: None   Collection  Time: 12/24/16  5:14 AM  Result Value Ref Range   Troponin i, poc 0.01 0.00 - 0.08 ng/mL   Comment 3            Comment: Due to the release kinetics of cTnI, a negative result within the first hours of the onset of symptoms does not rule out myocardial infarction with certainty. If myocardial infarction is still suspected, repeat the test at appropriate intervals.   Ethanol     Status: None   Collection Time: 12/24/16  5:15 AM  Result Value Ref Range   Alcohol, Ethyl (B) <5 <5 mg/dL    Comment:        LOWEST DETECTABLE LIMIT FOR SERUM ALCOHOL IS 5 mg/dL FOR MEDICAL PURPOSES ONLY   Protime-INR     Status: None   Collection Time: 12/24/16  5:15 AM  Result Value Ref Range   Prothrombin Time 13.8 11.4 - 15.2 seconds   INR 1.05   APTT     Status: None   Collection Time: 12/24/16  5:15 AM  Result Value Ref Range   aPTT 25 24 - 36 seconds  CBC     Status: Abnormal   Collection Time: 12/24/16  5:15 AM  Result Value Ref Range   WBC 14.3 (H) 4.0 - 10.5 K/uL   RBC 4.09 (L) 4.22 - 5.81 MIL/uL   Hemoglobin 12.1 (L) 13.0 - 17.0 g/dL   HCT 16.1 (L) 09.6 - 04.5 %   MCV 93.6 78.0 - 100.0 fL   MCH 29.6 26.0 - 34.0 pg   MCHC 31.6 30.0 - 36.0 g/dL   RDW 40.9 81.1 - 91.4 %   Platelets 275 150 - 400 K/uL  Differential     Status: Abnormal   Collection Time: 12/24/16  5:15 AM  Result Value Ref Range   Neutrophils Relative % 90 %   Neutro Abs 12.8 (H) 1.7 - 7.7 K/uL   Lymphocytes Relative 6 %   Lymphs Abs 0.8 0.7 - 4.0 K/uL   Monocytes Relative 4 %   Monocytes Absolute 0.6 0.1 - 1.0 K/uL   Eosinophils Relative 0 %   Eosinophils Absolute 0.0 0.0 - 0.7 K/uL   Basophils Relative 0 %   Basophils Absolute 0.0 0.0 - 0.1 K/uL  Comprehensive metabolic panel     Status: Abnormal   Collection Time: 12/24/16  5:15 AM  Result Value Ref Range   Sodium 141 135 - 145 mmol/L   Potassium 3.9 3.5 - 5.1 mmol/L   Chloride 103 101  - 111 mmol/L   CO2 23 22 - 32 mmol/L   Glucose, Bld 115 (H) 65 - 99 mg/dL   BUN 37 (H) 6 - 20 mg/dL   Creatinine, Ser 7.82 (H) 0.61 - 1.24 mg/dL   Calcium 8.4 (L) 8.9 - 10.3 mg/dL   Total Protein 6.4 (L) 6.5 - 8.1 g/dL   Albumin 4.0 3.5 - 5.0 g/dL   AST 20 15 - 41 U/L   ALT 16 (L) 17 - 63 U/L   Alkaline Phosphatase 99 38 - 126 U/L   Total Bilirubin 0.2 (L) 0.3 - 1.2 mg/dL   GFR calc non Af Amer 11 (L) >60 mL/min   GFR calc Af Amer 13 (L) >60 mL/min    Comment: (NOTE) The eGFR has been calculated using the CKD EPI equation. This calculation has not been validated in all clinical situations. eGFR's persistently <60 mL/min signify possible Chronic Kidney Disease.    Anion gap 15 5 -  15  I-Stat Chem 8, ED  (not at [facility], [facility])     Status: Abnormal   Collection Time: 12/24/16  5:16 AM  Result Value Ref Range   Sodium 141 135 - 145 mmol/L   Potassium 3.9 3.5 - 5.1 mmol/L   Chloride 101 101 - 111 mmol/L   BUN 38 (H) 6 - 20 mg/dL   Creatinine, Ser 4.70 (H) 0.61 - 1.24 mg/dL   Glucose, Bld 109 (H) 65 - 99 mg/dL   Calcium, [name] 1.10 (L) 1.15 - 1.40 mmol/L   TCO2 24 0 - 100 mmol/L   Hemoglobin 12.9 (L) 13.0 - 17.0 g/dL   HCT 38.0 (L) 39.0 - 52.0 %  I-Stat CG4 Lactic Acid, ED     Status: Abnormal   Collection Time: 12/24/16  5:16 AM  Result Value Ref Range   Lactic Acid, Venous 2.79 (HH) 0.5 - 1.9 mmol/L   Comment NOTIFIED PHYSICIAN   I-Stat venous blood gas, ED     Status: Abnormal   Collection Time: 12/24/16  5:20 AM  Result Value Ref Range   pH, Ven 7.270 7.250 - 7.430   pCO2, Ven 49.6 44.0 - 60.0 mmHg   pO2, Ven 22.0 (LL) 32.0 - 45.0 mmHg   Bicarbonate 22.8 20.0 - 28.0 mmol/L   TCO2 24 0 - 100 mmol/L   O2 Saturation 31.0 %   Acid-base deficit 4.0 (H) 0.0 - 2.0 mmol/L   Patient temperature HIDE    Sample type VENOUS    Comment NOTIFIED PHYSICIAN   Lactic acid, plasma     Status: None   Collection Time: 12/24/16  8:13 AM  Result Value Ref Range   Lactic Acid, Venous 1.1 0.5  - 1.9 mmol/L  Magnesium     Status: None   Collection Time: 12/24/16  8:13 AM  Result Value Ref Range   Magnesium 1.8 1.7 - 2.4 mg/dL  Phosphorus     Status: Abnormal   Collection Time: 12/24/16  8:13 AM  Result Value Ref Range   Phosphorus 5.3 (H) 2.5 - 4.6 mg/dL  CBC     Status: Abnormal   Collection Time: 12/24/16  8:13 AM  Result Value Ref Range   WBC 15.3 (H) 4.0 - 10.5 K/uL   RBC 3.98 (L) 4.22 - 5.81 MIL/uL   Hemoglobin 11.9 (L) 13.0 - 17.0 g/dL   HCT 37.0 (L) 39.0 - 52.0 %   MCV 93.0 78.0 - 100.0 fL   MCH 29.9 26.0 - 34.0 pg   MCHC 32.2 30.0 - 36.0 g/dL   RDW 13.4 11.5 - 15.5 %   Platelets 284 150 - 400 K/uL  Creatinine, serum     Status: Abnormal   Collection Time: 12/24/16  8:13 AM  Result Value Ref Range   Creatinine, Ser 4.16 (H) 0.61 - 1.24 mg/dL   GFR calc non Af Amer 13 (L) >60 mL/min   GFR calc Af Amer 15 (L) >60 mL/min    Comment: (NOTE) The eGFR has been calculated using the CKD EPI equation. This calculation has not been validated in all clinical situations. eGFR's persistently <60 mL/min signify possible Chronic Kidney Disease.   Glucose, capillary     Status: Abnormal   Collection Time: 12/24/16  9:07 AM  Result Value Ref Range   Glucose-Capillary 110 (H) 65 - 99 mg/dL  Lactic acid, plasma     Status: None   Collection Time: 12/24/16 10:44 AM  Result Value Ref Range   Lactic Acid, Venous 0.7  0.5 - 1.9 mmol/L   Ct Head Wo Contrast  Result Date: 12/24/2016 CLINICAL DATA:  Confusion, sepsis, slurred speech EXAM: CT HEAD WITHOUT CONTRAST TECHNIQUE: Contiguous axial images were obtained from the base of the skull through the vertex without intravenous contrast. COMPARISON:  12/06/2016 FINDINGS: Brain: Stable brain atrophy pattern with chronic white matter microvascular ischemic changes throughout the cerebral hemispheres. Remote bilateral lacunar-type basilar ganglia infarctions and right cerebellar infarct. No acute intracranial hemorrhage, mass lesion, new  infarction, focal mass effect or edema. Negative for herniation, midline shift, hydrocephalus, or extra-axial fluid collection. Cisterns are patent. Vascular: Atherosclerosis of the intracranial vessels. No hyperdense vessel demonstrated. Skull: Normal. Negative for fracture or focal lesion. Sinuses/Orbits: No acute finding. Other: None. IMPRESSION: Stable brain atrophy, chronic white matter microvascular ischemic changes and remote infarcts as above. No interval change or acute process by noncontrast CT. Electronically Signed   By: Jerilynn Mages.  Shick M.D.   On: 12/24/2016 07:53   US Renal  Result Date: 12/24/2016 CLINICAL DATA:  Acute kidney injury. EXAM: RENAL / URINARY TRACT ULTRASOUND COMPLETE COMPARISON:  None. FINDINGS: Right Kidney: Length: 10.6 cm. Echogenicity within normal limits. No mass or hydronephrosis visualized. Left Kidney: Length: 10.4 cm. Echogenicity within normal limits. No mass or hydronephrosis visualized. Bladder: Appears normal for degree of bladder distention. IMPRESSION: Negative for hydronephrosis.  Normal exam. Electronically Signed   By: Inge Rise M.D.   On: 12/24/2016 12:05   Dg Chest Port 1 View  Result Date: 12/24/2016 CLINICAL DATA:  Confusion EXAM: PORTABLE CHEST 1 VIEW COMPARISON:  10/29/2016 FINDINGS: Normal heart size. Lungs are under aerated. Central and basilar airspace disease. Vascular congestion. No pneumothorax. IMPRESSION: There is vascular congestion with central basilar hazy airspace disease. Electronically Signed   By: Marybelle Killings M.D.   On: 12/24/2016 11:32    ROS: all other systems reviewed and are negative except as per HPI  Blood pressure (!) 117/57, pulse (!) 106, temperature 98.3 F (36.8 C), temperature source Oral, resp. rate (!) 22, height '5\' 4"'$  (1.626 m), weight 79.4 kg (175 lb), SpO2 93 %. Physical Exam  GEN older gentleman, NAD HEENT tacky MM NECK bilateral carotid bruits CV RRR soft systolic murmur PULM clear bilaterally ABD  nondistended, nontender EXT 1+ LE edema NEURO L sided weakness   Assessment/Plan  1.  Acute on chronic kidney injury: likely hemodynamically mediated in setting of sepsis.  Making urine.  Baseline appears to be in the low 2s.  Never has seen a nephrologist.  Renal US with smaller side kidneys.  Will get UA/ UPC.  Don't think a big workup is necessary at this point.  2.  Chills/sweats/sepsis: unclear source; he does in fact have hardware in the back so ? If this is an occult source.  On antibiotics. Pt says he has sweats frequently and has that pulmonary nodule.  This may be worth investigating to make sure these aren't all B symptoms from a malignancy (although I think probably more sepsis).  3.  HTN: very labile BP s according to his wife.  Will get renal dopplers, ARR to start.  4.  S/p CVA.  With residual deficits.     Madelon Lips MD [facility] pgr [phone] 12/24/2016, 1:31 PM

## 2016-12-24 NOTE — ED Notes (Addendum)
Attempted report x3.   Asked to talk to charge.  Was told all RNs, including charge nurse, were still in report.

## 2016-12-24 NOTE — ED Notes (Signed)
Attempted report x1.  Name of RN taking pt provided.

## 2016-12-25 ENCOUNTER — Inpatient Hospital Stay (HOSPITAL_COMMUNITY): Payer: Non-veteran care

## 2016-12-25 ENCOUNTER — Encounter (HOSPITAL_COMMUNITY): Payer: Self-pay | Admitting: *Deleted

## 2016-12-25 DIAGNOSIS — K219 Gastro-esophageal reflux disease without esophagitis: Secondary | ICD-10-CM

## 2016-12-25 DIAGNOSIS — G43A0 Cyclical vomiting, not intractable: Secondary | ICD-10-CM

## 2016-12-25 DIAGNOSIS — I4892 Unspecified atrial flutter: Secondary | ICD-10-CM

## 2016-12-25 DIAGNOSIS — A419 Sepsis, unspecified organism: Principal | ICD-10-CM

## 2016-12-25 DIAGNOSIS — I10 Essential (primary) hypertension: Secondary | ICD-10-CM

## 2016-12-25 DIAGNOSIS — F319 Bipolar disorder, unspecified: Secondary | ICD-10-CM

## 2016-12-25 LAB — CBC
HCT: 31.9 % — ABNORMAL LOW (ref 39.0–52.0)
Hemoglobin: 10.2 g/dL — ABNORMAL LOW (ref 13.0–17.0)
MCH: 29.6 pg (ref 26.0–34.0)
MCHC: 32 g/dL (ref 30.0–36.0)
MCV: 92.5 fL (ref 78.0–100.0)
PLATELETS: 243 10*3/uL (ref 150–400)
RBC: 3.45 MIL/uL — AB (ref 4.22–5.81)
RDW: 13.3 % (ref 11.5–15.5)
WBC: 8.6 10*3/uL (ref 4.0–10.5)

## 2016-12-25 LAB — BASIC METABOLIC PANEL
Anion gap: 7 (ref 5–15)
BUN: 27 mg/dL — AB (ref 6–20)
CALCIUM: 8.3 mg/dL — AB (ref 8.9–10.3)
CHLORIDE: 110 mmol/L (ref 101–111)
CO2: 24 mmol/L (ref 22–32)
CREATININE: 2.15 mg/dL — AB (ref 0.61–1.24)
GFR, EST AFRICAN AMERICAN: 34 mL/min — AB (ref >60)
GFR, EST NON AFRICAN AMERICAN: 30 mL/min — AB (ref >60)
Glucose, Bld: 98 mg/dL (ref 65–99)
Potassium: 3.6 mmol/L (ref 3.5–5.1)
SODIUM: 141 mmol/L (ref 135–145)

## 2016-12-25 LAB — PROCALCITONIN: PROCALCITONIN: 0.16 ng/mL

## 2016-12-25 MED ORDER — VANCOMYCIN HCL 10 G IV SOLR
1250.0000 mg | INTRAVENOUS | Status: DC
  Administered 2016-12-25 – 2016-12-26 (×2): 1250 mg via INTRAVENOUS
  Filled 2016-12-25 (×2): qty 1250

## 2016-12-25 MED ORDER — OXYCODONE HCL 5 MG PO TABS
10.0000 mg | ORAL_TABLET | Freq: Three times a day (TID) | ORAL | Status: DC | PRN
  Administered 2016-12-25 – 2016-12-28 (×7): 10 mg via ORAL
  Filled 2016-12-25 (×9): qty 2

## 2016-12-25 MED ORDER — DEXTROSE 5 % IV SOLN
1.0000 g | INTRAVENOUS | Status: DC
  Administered 2016-12-25 – 2016-12-27 (×3): 1 g via INTRAVENOUS
  Filled 2016-12-25 (×4): qty 10

## 2016-12-25 MED ORDER — SODIUM CHLORIDE 0.9 % IV SOLN
INTRAVENOUS | Status: DC
  Administered 2016-12-25 – 2016-12-28 (×4): via INTRAVENOUS

## 2016-12-25 MED ORDER — ENSURE ENLIVE PO LIQD
237.0000 mL | Freq: Three times a day (TID) | ORAL | Status: DC
  Administered 2016-12-25 – 2016-12-28 (×5): 237 mL via ORAL

## 2016-12-25 MED ORDER — IOPAMIDOL (ISOVUE-300) INJECTION 61%
INTRAVENOUS | Status: AC
  Filled 2016-12-25: qty 30

## 2016-12-25 MED ORDER — METRONIDAZOLE IN NACL 5-0.79 MG/ML-% IV SOLN
500.0000 mg | Freq: Three times a day (TID) | INTRAVENOUS | Status: DC
  Administered 2016-12-25 – 2016-12-28 (×7): 500 mg via INTRAVENOUS
  Filled 2016-12-25 (×8): qty 100

## 2016-12-25 NOTE — Consult Note (Signed)
Reason for Consult:  Possible bowel perforation Referring Physician: Gearl Baratta is an 71 y.o. male.  HPI: This is a 71 yo male with a history of CKD, HTN, hyperlipidemia, bipolar disease, and anemia who presented to the ED last night with weakness.  He was found to be febrile and hypotensive.  He was treated for sepsis with unclear etiology.  CT head was negative.  The patient is significantly improved today and was actually on a diet earlier today.  He received a CT chest/abd/pelvis in searching for a source of sepsis.  CT abd noted a small foci of free intraperitoneal air in the RLQ with trace free fluid in the right paracolic gutter.  His abdominal exam is benign but we are asked to see the patient in consultation.  He is now NPO.  He is on Rocephin.  He reported nausea, vomiting, and diarrhea last week, but is currently asymptomatic.  Past Medical History:  Diagnosis Date  . Acid reflux disease with ulcer   . AKI (acute kidney injury) ([place])   . Anemia   . Arthritis   . Bipolar 1 disorder ([place])   . Carotid stenosis   . Chronic back pain   . Complication of anesthesia 2000   reaction to anesthesia had some swelling   . Depression   . GERD (gastroesophageal reflux disease)   . Hearing loss   . Hyperlipidemia   . Hypertension   . Prostate cancer [facility])    prostate cancer  . PTSD (post-traumatic stress disorder)   . Stroke ([place]) 08/2015  . TIA (transient ischemic attack)    has had several mini strokes    Past Surgical History:  Procedure Laterality Date  . BACK SURGERY    . CARDIAC CATHETERIZATION     cant remember when  . COLONOSCOPY N/A 10/16/2015   Procedure: COLONOSCOPY;  Surgeon: [name], MD;  Location: [facility] ENDOSCOPY;  Service: Gastroenterology;  Laterality: N/A;  . ESOPHAGOGASTRODUODENOSCOPY N/A 10/15/2015   Procedure: ESOPHAGOGASTRODUODENOSCOPY (EGD);  Surgeon: [name], MD;  Location: [place];  Service: Gastroenterology;   Laterality: N/A;  . EYE SURGERY     cataracts  . GIVENS CAPSULE STUDY N/A 10/16/2015   Procedure: GIVENS CAPSULE STUDY;  Surgeon: [name], MD;  Location: [place];  Service: Gastroenterology;  Laterality: N/A;  . PROSTATE SURGERY      Family History  Problem Relation Age of Onset  . Stroke Mother        Hemorrhagic stroke  . Cancer Father        Pancreatic cancer  . Hypertension Father     Social History:  reports that he quit smoking about 13 years ago. He has never used smokeless tobacco. He reports that he does not drink alcohol or use drugs.  Allergies:  Allergies  Allergen Reactions  . Penicillins Other (See Comments)    Tested positive for this Has patient had a PCN reaction causing immediate rash, facial/tongue/throat swelling, SOB or lightheadedness with hypotension: Unknown Has patient had a PCN reaction causing severe rash involving mucus membranes or skin necrosis: Unknown Has patient had a PCN reaction that required hospitalization: Unknown Has patient had a PCN reaction occurring within the last 10 years: No If all of the above answers are "NO", then may proceed with Cephalosporin use.     Medications:   Prior to Admission medications   Medication Sig Start Date End Date Taking? Authorizing Provider  aspirin 325 MG tablet Take 325  mg by mouth daily.   Yes [provider]  atorvastatin (LIPITOR) 40 MG tablet Take 1 tablet (40 mg total) by mouth daily at 6 PM. Patient taking differently: Take 40 mg by mouth at bedtime.  04/14/14  Yes Mikhail, [place], DO  Calcium Carbonate (CALCIUM 600 PO) Take 2 tablets by mouth daily.   Yes [provider]  Carboxymethylcellulose Sodium (THERATEARS OP) Place 1 drop into both eyes 4 (four) times daily.   Yes [provider]  Cholecalciferol (VITAMIN D-3) 1000 units CAPS Take 1,000 Units by mouth daily.   Yes [provider]  ferrous sulfate 325 (65 FE) MG tablet Take 1 tablet  (325 mg total) by mouth 2 (two) times daily with a meal. Patient taking differently: Take 650 mg by mouth daily.  10/17/15  Yes Robbie Lis, MD  FLUoxetine (PROZAC) 20 MG capsule Take 60 mg by mouth daily.   Yes [provider]  gabapentin (NEURONTIN) 600 MG tablet Take 1,200 mg by mouth at bedtime.   Yes [provider]  Glucosamine Sulfate 1000 MG CAPS Take 2,000 mg by mouth daily.   Yes [provider]  lamoTRIgine (LAMICTAL) 25 MG tablet Take 100 mg by mouth at bedtime.    Yes [provider]  MENTHOL-METHYL SALICYLATE EX Apply 1 application topically See admin instructions. TO AFFECTED AREA(S) THREE TIMES A DAY AS NEEDED FOR PAIN   Yes [provider]  mirtazapine (REMERON) 45 MG tablet Take 45 mg by mouth at bedtime.   Yes [provider]  Omega-3 Fatty Acids (FISH OIL) 1000 MG CAPS Take 2,000 mg by mouth at bedtime.    Yes [provider]  omeprazole (PRILOSEC) 20 MG capsule Take 20 mg by mouth 2 (two) times daily before a meal.   Yes [provider]  Oxycodone HCl 10 MG TABS Take 10 mg by mouth 3 (three) times daily as needed (for pain).   Yes [provider]  oxymetazoline (AFRIN) 0.05 % nasal spray Place 1-2 sprays into both nostrils 2 (two) times daily as needed for congestion.   Yes [provider]  pantoprazole (PROTONIX) 40 MG tablet Take 1 tablet (40 mg total) by mouth daily at 6 (six) AM. 10/17/15  Yes Robbie Lis, MD  prazosin (MINIPRESS) 1 MG capsule Take 3 mg by mouth at bedtime.   Yes [provider]     Results for orders placed or performed during the hospital encounter of 12/24/16 (from the past 48 hour(s))  Type and screen [place]     Status: None   Collection Time: 12/24/16  5:07 AM  Result Value Ref Range   ABO/RH(D) O POS    Antibody Screen NEG    Sample Expiration 12/27/2016   I-stat troponin, ED (not at [facility], [facility])     Status: None    Collection Time: 12/24/16  5:14 AM  Result Value Ref Range   Troponin i, poc 0.01 0.00 - 0.08 ng/mL   Comment 3            Comment: Due to the release kinetics of cTnI, a negative result within the first hours of the onset of symptoms does not rule out myocardial infarction with certainty. If myocardial infarction is still suspected, repeat the test at appropriate intervals.   Ethanol     Status: None   Collection Time: 12/24/16  5:15 AM  Result Value Ref Range   Alcohol, Ethyl (B) <5 <5 mg/dL  Comment:        LOWEST DETECTABLE LIMIT FOR SERUM ALCOHOL IS 5 mg/dL FOR MEDICAL PURPOSES ONLY   Protime-INR     Status: None   Collection Time: 12/24/16  5:15 AM  Result Value Ref Range   Prothrombin Time 13.8 11.4 - 15.2 seconds   INR 1.05   APTT     Status: None   Collection Time: 12/24/16  5:15 AM  Result Value Ref Range   aPTT 25 24 - 36 seconds  CBC     Status: Abnormal   Collection Time: 12/24/16  5:15 AM  Result Value Ref Range   WBC 14.3 (H) 4.0 - 10.5 K/uL   RBC 4.09 (L) 4.22 - 5.81 MIL/uL   Hemoglobin 12.1 (L) 13.0 - 17.0 g/dL   HCT 38.3 (L) 39.0 - 52.0 %   MCV 93.6 78.0 - 100.0 fL   MCH 29.6 26.0 - 34.0 pg   MCHC 31.6 30.0 - 36.0 g/dL   RDW 13.5 11.5 - 15.5 %   Platelets 275 150 - 400 K/uL  Differential     Status: Abnormal   Collection Time: 12/24/16  5:15 AM  Result Value Ref Range   Neutrophils Relative % 90 %   Neutro Abs 12.8 (H) 1.7 - 7.7 K/uL   Lymphocytes Relative 6 %   Lymphs Abs 0.8 0.7 - 4.0 K/uL   Monocytes Relative 4 %   Monocytes Absolute 0.6 0.1 - 1.0 K/uL   Eosinophils Relative 0 %   Eosinophils Absolute 0.0 0.0 - 0.7 K/uL   Basophils Relative 0 %   Basophils Absolute 0.0 0.0 - 0.1 K/uL  Comprehensive metabolic panel     Status: Abnormal   Collection Time: 12/24/16  5:15 AM  Result Value Ref Range   Sodium 141 135 - 145 mmol/L   Potassium 3.9 3.5 - 5.1 mmol/L   Chloride 103 101 - 111 mmol/L   CO2 23 22 - 32 mmol/L   Glucose, Bld 115 (H)  65 - 99 mg/dL   BUN 37 (H) 6 - 20 mg/dL   Creatinine, Ser 4.72 (H) 0.61 - 1.24 mg/dL   Calcium 8.4 (L) 8.9 - 10.3 mg/dL   Total Protein 6.4 (L) 6.5 - 8.1 g/dL   Albumin 4.0 3.5 - 5.0 g/dL   AST 20 15 - 41 U/L   ALT 16 (L) 17 - 63 U/L   Alkaline Phosphatase 99 38 - 126 U/L   Total Bilirubin 0.2 (L) 0.3 - 1.2 mg/dL   GFR calc non Af Amer 11 (L) >60 mL/min   GFR calc Af Amer 13 (L) >60 mL/min    Comment: (NOTE) The eGFR has been calculated using the CKD EPI equation. This calculation has not been validated in all clinical situations. eGFR's persistently <60 mL/min signify possible Chronic Kidney Disease.    Anion gap 15 5 - 15  I-Stat Chem 8, ED  (not at [facility], [facility])     Status: Abnormal   Collection Time: 12/24/16  5:16 AM  Result Value Ref Range   Sodium 141 135 - 145 mmol/L   Potassium 3.9 3.5 - 5.1 mmol/L   Chloride 101 101 - 111 mmol/L   BUN 38 (H) 6 - 20 mg/dL   Creatinine, Ser 4.70 (H) 0.61 - 1.24 mg/dL   Glucose, Bld 109 (H) 65 - 99 mg/dL   Calcium, Ion 1.10 (L) 1.15 - 1.40 mmol/L   TCO2 24 0 - 100 mmol/L   Hemoglobin 12.9 (L) 13.0 -  17.0 g/dL   HCT 38.0 (L) 39.0 - 52.0 %  I-Stat CG4 Lactic Acid, ED     Status: Abnormal   Collection Time: 12/24/16  5:16 AM  Result Value Ref Range   Lactic Acid, Venous 2.79 (HH) 0.5 - 1.9 mmol/L   Comment NOTIFIED PHYSICIAN   I-Stat venous blood gas, ED     Status: Abnormal   Collection Time: 12/24/16  5:20 AM  Result Value Ref Range   pH, Ven 7.270 7.250 - 7.430   pCO2, Ven 49.6 44.0 - 60.0 mmHg   pO2, Ven 22.0 (LL) 32.0 - 45.0 mmHg   Bicarbonate 22.8 20.0 - 28.0 mmol/L   TCO2 24 0 - 100 mmol/L   O2 Saturation 31.0 %   Acid-base deficit 4.0 (H) 0.0 - 2.0 mmol/L   Patient temperature HIDE    Sample type VENOUS    Comment NOTIFIED PHYSICIAN   Blood Culture (routine x 2)     Status: None (Preliminary result)   Collection Time: 12/24/16  5:38 AM  Result Value Ref Range   Specimen Description BLOOD RIGHT ARM    Special Requests       BOTTLES DRAWN AEROBIC AND ANAEROBIC Blood Culture adequate volume   Culture NO GROWTH 1 DAY    Report Status PENDING   Blood Culture (routine x 2)     Status: None (Preliminary result)   Collection Time: 12/24/16  5:38 AM  Result Value Ref Range   Specimen Description BLOOD RIGHT HAND    Special Requests IN PEDIATRIC BOTTLE Blood Culture adequate volume    Culture NO GROWTH 1 DAY    Report Status PENDING   Lactic acid, plasma     Status: None   Collection Time: 12/24/16  8:13 AM  Result Value Ref Range   Lactic Acid, Venous 1.1 0.5 - 1.9 mmol/L  Magnesium     Status: None   Collection Time: 12/24/16  8:13 AM  Result Value Ref Range   Magnesium 1.8 1.7 - 2.4 mg/dL  Phosphorus     Status: Abnormal   Collection Time: 12/24/16  8:13 AM  Result Value Ref Range   Phosphorus 5.3 (H) 2.5 - 4.6 mg/dL  CBC     Status: Abnormal   Collection Time: 12/24/16  8:13 AM  Result Value Ref Range   WBC 15.3 (H) 4.0 - 10.5 K/uL   RBC 3.98 (L) 4.22 - 5.81 MIL/uL   Hemoglobin 11.9 (L) 13.0 - 17.0 g/dL   HCT 37.0 (L) 39.0 - 52.0 %   MCV 93.0 78.0 - 100.0 fL   MCH 29.9 26.0 - 34.0 pg   MCHC 32.2 30.0 - 36.0 g/dL   RDW 13.4 11.5 - 15.5 %   Platelets 284 150 - 400 K/uL  Creatinine, serum     Status: Abnormal   Collection Time: 12/24/16  8:13 AM  Result Value Ref Range   Creatinine, Ser 4.16 (H) 0.61 - 1.24 mg/dL   GFR calc non Af Amer 13 (L) >60 mL/min   GFR calc Af Amer 15 (L) >60 mL/min    Comment: (NOTE) The eGFR has been calculated using the CKD EPI equation. This calculation has not been validated in all clinical situations. eGFR's persistently <60 mL/min signify possible Chronic Kidney Disease.   Glucose, capillary     Status: Abnormal   Collection Time: 12/24/16  9:07 AM  Result Value Ref Range   Glucose-Capillary 110 (H) 65 - 99 mg/dL  Lactic acid, plasma  Status: None   Collection Time: 12/24/16 10:44 AM  Result Value Ref Range   Lactic Acid, Venous 0.7 0.5 - 1.9 mmol/L   Urine rapid drug screen (hosp performed)not at [facility]     Status: None   Collection Time: 12/24/16  2:18 PM  Result Value Ref Range   Opiates NONE DETECTED NONE DETECTED   Cocaine NONE DETECTED NONE DETECTED   Benzodiazepines NONE DETECTED NONE DETECTED   Amphetamines NONE DETECTED NONE DETECTED   Tetrahydrocannabinol NONE DETECTED NONE DETECTED   Barbiturates NONE DETECTED NONE DETECTED    Comment:        DRUG SCREEN FOR MEDICAL PURPOSES ONLY.  IF CONFIRMATION IS NEEDED FOR ANY PURPOSE, NOTIFY LAB WITHIN 5 DAYS.        LOWEST DETECTABLE LIMITS FOR URINE DRUG SCREEN Drug Class       Cutoff (ng/mL) Amphetamine      1000 Barbiturate      200 Benzodiazepine   761 Tricyclics       607 Opiates          300 Cocaine          300 THC              50   Urinalysis, Routine w reflex microscopic     Status: Abnormal   Collection Time: 12/24/16  2:18 PM  Result Value Ref Range   Color, Urine YELLOW YELLOW   APPearance HAZY (A) CLEAR   Specific Gravity, Urine 1.014 1.005 - 1.030   pH 5.0 5.0 - 8.0   Glucose, UA NEGATIVE NEGATIVE mg/dL   Hgb urine dipstick MODERATE (A) NEGATIVE   Bilirubin Urine NEGATIVE NEGATIVE   Ketones, ur NEGATIVE NEGATIVE mg/dL   Protein, ur 30 (A) NEGATIVE mg/dL   Nitrite NEGATIVE NEGATIVE   Leukocytes, UA NEGATIVE NEGATIVE   RBC / HPF 6-30 0 - 5 RBC/hpf   WBC, UA 6-30 0 - 5 WBC/hpf   Bacteria, UA RARE (A) NONE SEEN   Squamous Epithelial / LPF 0-5 (A) NONE SEEN   Mucous PRESENT    Hyaline Casts, UA PRESENT    Granular Casts, UA PRESENT   Creatinine, urine, random     Status: None   Collection Time: 12/24/16  2:18 PM  Result Value Ref Range   Creatinine, Urine 141.15 mg/dL  Sodium, urine, random     Status: None   Collection Time: 12/24/16  2:18 PM  Result Value Ref Range   Sodium, Ur 55 mmol/L  MRSA PCR Screening     Status: None   Collection Time: 12/24/16  2:20 PM  Result Value Ref Range   MRSA by PCR NEGATIVE NEGATIVE    Comment:        The  GeneXpert MRSA Assay (FDA approved for NASAL specimens only), is one component of a comprehensive MRSA colonization surveillance program. It is not intended to diagnose MRSA infection nor to guide or monitor treatment for MRSA infections.   Procalcitonin - Baseline     Status: None   Collection Time: 12/25/16  3:48 AM  Result Value Ref Range   Procalcitonin 0.16 ng/mL    Comment:        Interpretation: PCT (Procalcitonin) <= 0.5 ng/mL: Systemic infection (sepsis) is not likely. Local bacterial infection is possible. (NOTE)         ICU PCT Algorithm               Non ICU PCT Algorithm    ----------------------------     ------------------------------  PCT < 0.25 ng/mL                 PCT < 0.1 ng/mL     Stopping of antibiotics            Stopping of antibiotics       strongly encouraged.               strongly encouraged.    ----------------------------     ------------------------------       PCT level decrease by               PCT < 0.25 ng/mL       >= 80% from peak PCT       OR PCT 0.25 - 0.5 ng/mL          Stopping of antibiotics                                             encouraged.     Stopping of antibiotics           encouraged.    ----------------------------     ------------------------------       PCT level decrease by              PCT >= 0.25 ng/mL       < 80% from peak PCT        AND PCT >= 0.5 ng/mL            Continuin g antibiotics                                              encouraged.       Continuing antibiotics            encouraged.    ----------------------------     ------------------------------     PCT level increase compared          PCT > 0.5 ng/mL         with peak PCT AND          PCT >= 0.5 ng/mL             Escalation of antibiotics                                          strongly encouraged.      Escalation of antibiotics        strongly encouraged.   Basic metabolic panel     Status: Abnormal   Collection Time: 12/25/16  3:51  AM  Result Value Ref Range   Sodium 141 135 - 145 mmol/L   Potassium 3.6 3.5 - 5.1 mmol/L   Chloride 110 101 - 111 mmol/L   CO2 24 22 - 32 mmol/L   Glucose, Bld 98 65 - 99 mg/dL   BUN 27 (H) 6 - 20 mg/dL   Creatinine, Ser 2.15 (H) 0.61 - 1.24 mg/dL    Comment: DELTA CHECK NOTED   Calcium 8.3 (L) 8.9 - 10.3 mg/dL   GFR calc non Af Amer 30 (L) >60 mL/min   GFR calc Af Amer 34 (L) >60 mL/min    Comment: (NOTE)  The eGFR has been calculated using the CKD EPI equation. This calculation has not been validated in all clinical situations. eGFR's persistently <60 mL/min signify possible Chronic Kidney Disease.    Anion gap 7 5 - 15  CBC     Status: Abnormal   Collection Time: 12/25/16  3:51 AM  Result Value Ref Range   WBC 8.6 4.0 - 10.5 K/uL   RBC 3.45 (L) 4.22 - 5.81 MIL/uL   Hemoglobin 10.2 (L) 13.0 - 17.0 g/dL   HCT 31.9 (L) 39.0 - 52.0 %   MCV 92.5 78.0 - 100.0 fL   MCH 29.6 26.0 - 34.0 pg   MCHC 32.0 30.0 - 36.0 g/dL   RDW 13.3 11.5 - 15.5 %   Platelets 243 150 - 400 K/uL    Ct Abdomen Pelvis Wo Contrast  Result Date: 12/25/2016 CLINICAL DATA:  Patient has N/V/D for about 1 week with fever and possible sepsis. Per patient he does not have any complaints excepted he is real weak, looking for source of infection" EXAM: CT CHEST, ABDOMEN AND PELVIS WITHOUT CONTRAST TECHNIQUE: Multidetector CT imaging of the chest, abdomen and pelvis was performed following the standard protocol without IV contrast. COMPARISON:  None. FINDINGS: CT CHEST FINDINGS Cardiovascular: Scattered atherosclerosis of the normal caliber thoracic aorta. Heart size is normal. No pericardial effusion. Coronary artery calcifications noted. Mediastinum/Nodes: No mass or enlarged lymph nodes within the mediastinum or perihilar regions. Esophagus is unremarkable. Trachea and central bronchi are unremarkable. Lungs/Pleura: 1 cm nodular consolidation in the right lung apex (series 5, image 25). 1.4 cm nodular consolidation at  the right lung apex, most likely nodular scarring/atelectasis as it is contiguous with adjacent linear scarring/atelectasis (image 114). Left lung is clear. No pleural effusion or pneumothorax. Musculoskeletal: Mild degenerative spurring within the thoracic spine. No acute or suspicious osseous finding. Superficial soft tissues are unremarkable. CT ABDOMEN PELVIS FINDINGS Hepatobiliary: No focal liver abnormality is seen. No gallstones, gallbladder wall thickening, or biliary dilatation. Pancreas: Unremarkable. No pancreatic ductal dilatation or surrounding inflammatory changes. Spleen: Normal in size without focal abnormality. Adrenals/Urinary Tract: Left adrenal mass measuring 1.9 cm, Hounsfield unit measurement of 21. Right adrenal gland appears normal. Kidneys appear normal without mass, stone or hydronephrosis. 2 mm stone within the proximal left ureter. Bladder is decompressed. Stomach/Bowel: Bowel is normal in caliber. Extensive diverticulosis of the sigmoid colon. Moderate degree of diverticulosis throughout the remainder of the colon. Vascular/Lymphatic: Aortic atherosclerosis. No enlarged lymph nodes seen. Reproductive: Status post prostatectomy. Other: Small foci of free intraperitoneal air within the lower right abdomen. Trace free fluid in the right paracolic gutter. Musculoskeletal: Degenerative changes throughout the thoracolumbar spine, mild to moderate in degree, with prominent degenerative facet arthropathies in the lower lumbar spine contributing to neural foramen narrowings at the L4-5 and L5-S1 levels bilaterally. IMPRESSION: 1. Small foci of free intraperitoneal air within the right lower abdomen, indicating bowel perforation. The source of the free intraperitoneal air is unclear, most likely related to perforated diverticulum within the adjacent right colon given its location and the trace free fluid in the right paracolic gutter, but no convincing diverticulitis is identified. Of note, the  adjacent small bowel in the right abdomen appears normal. Another possible source for the free air would be the stomach as there is suggestion of gastric wall thickening at the level of the antrum and perigastric inflammation. The free air, however, is a fairly large distance from the stomach which makes the stomach a somewhat less likely source. 2.  Diffuse colonic diverticulosis, most prominent within the sigmoid colon. 3. 2 mm nonobstructing left ureteral stone. Associated hydronephrosis. 4. Nodular consolidations within the right upper lobe and right lower lobe. 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 These are less likely acute pneumonia or neoplastic process. Non-contrast chest CT at 3-6 months is recommended. If the nodules are stable at time of repeat CT, then future CT at 18-24 months (from today's scan) is considered optional for low-risk patients, but is recommended for high-risk patients. This recommendation follows the consensus statement: Guidelines for Management of Incidental Pulmonary Nodules Detected on CT Images: From the Fleischner Society 2017; Radiology 2017; 284:228-243. 5. **An incidental finding of potential clinical significance has been found. Left adrenal mass measuring 1.9 cm, with Hounsfield unit measurement not compatible with benign lipid rich adrenal adenoma based on this noncontrast exam. Per consensus guidelines, this is a probably benign finding for which a follow-up adrenal protocol CT is recommended in 12 months.** 6. Aortic atherosclerosis. 7. 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 If any associated radiculopathic symptoms, would consider nonemergent lumbar spine MRI to exclude associated nerve root impingement. At minimum, recommend close clinical follow-up and short-term follow-up CT to  ensure stability or resolution of the acute/significant findings detailed above. Critical Value/emergent results were called by telephone at the time of interpretation on 12/25/2016 at 7:11 pm to Dr. Nira Conn RAI , who verbally acknowledged these results. Electronically Signed   By: Franki Cabot M.D.   On: 12/25/2016 19:31   Ct Head Wo Contrast  Result Date: 12/24/2016 CLINICAL DATA:  Confusion, sepsis, slurred speech EXAM: CT HEAD WITHOUT CONTRAST TECHNIQUE: Contiguous axial images were obtained from the base of the skull through the vertex without intravenous contrast. COMPARISON:  12/06/2016 FINDINGS: Brain: Stable brain atrophy pattern with chronic white matter microvascular ischemic changes throughout the cerebral hemispheres. Remote bilateral lacunar-type basilar ganglia infarctions and right cerebellar infarct. No acute intracranial hemorrhage, mass lesion, new infarction, focal mass effect or edema. Negative for herniation, midline shift, hydrocephalus, or extra-axial fluid collection. Cisterns are patent. Vascular: Atherosclerosis of the intracranial vessels. No hyperdense vessel demonstrated. Skull: Normal. Negative for fracture or focal lesion. Sinuses/Orbits: No acute finding. Other: None. IMPRESSION: Stable brain atrophy, chronic white matter microvascular ischemic changes and remote infarcts as above. No interval change or acute process by noncontrast CT. Electronically Signed   By: Jerilynn Mages.  Shick M.D.   On: 12/24/2016 07:53   Ct Chest Wo Contrast  Result Date: 12/25/2016 CLINICAL DATA:  Patient has N/V/D for about 1 week with fever and possible sepsis. Per patient he does not have any complaints excepted he is real weak, looking for source of infection" EXAM: CT CHEST, ABDOMEN AND PELVIS WITHOUT CONTRAST TECHNIQUE: Multidetector CT imaging of the chest, abdomen and pelvis was performed following the standard protocol without IV contrast. COMPARISON:  None. FINDINGS: CT CHEST FINDINGS  Cardiovascular: Scattered atherosclerosis of the normal caliber thoracic aorta. Heart size is normal. No pericardial effusion. Coronary artery calcifications noted. Mediastinum/Nodes: No mass or enlarged lymph nodes within the mediastinum or perihilar regions. Esophagus is unremarkable. Trachea and central bronchi are unremarkable. Lungs/Pleura: 1 cm nodular consolidation in the right lung apex (series 5, image 25). 1.4 cm nodular consolidation at the right lung apex, most likely nodular scarring/atelectasis as it is contiguous with adjacent linear scarring/atelectasis (image 114). Left lung is clear. No  pleural effusion or pneumothorax. Musculoskeletal: Mild degenerative spurring within the thoracic spine. No acute or suspicious osseous finding. Superficial soft tissues are unremarkable. CT ABDOMEN PELVIS FINDINGS Hepatobiliary: No focal liver abnormality is seen. No gallstones, gallbladder wall thickening, or biliary dilatation. Pancreas: Unremarkable. No pancreatic ductal dilatation or surrounding inflammatory changes. Spleen: Normal in size without focal abnormality. Adrenals/Urinary Tract: Left adrenal mass measuring 1.9 cm, Hounsfield unit measurement of 21. Right adrenal gland appears normal. Kidneys appear normal without mass, stone or hydronephrosis. 2 mm stone within the proximal left ureter. Bladder is decompressed. Stomach/Bowel: Bowel is normal in caliber. Extensive diverticulosis of the sigmoid colon. Moderate degree of diverticulosis throughout the remainder of the colon. Vascular/Lymphatic: Aortic atherosclerosis. No enlarged lymph nodes seen. Reproductive: Status post prostatectomy. Other: Small foci of free intraperitoneal air within the lower right abdomen. Trace free fluid in the right paracolic gutter. Musculoskeletal: Degenerative changes throughout the thoracolumbar spine, mild to moderate in degree, with prominent degenerative facet arthropathies in the lower lumbar spine contributing to  neural foramen narrowings at the L4-5 and L5-S1 levels bilaterally. IMPRESSION: 1. Small foci of free intraperitoneal air within the right lower abdomen, indicating bowel perforation. The source of the free intraperitoneal air is unclear, most likely related to perforated diverticulum within the adjacent right colon given its location and the trace free fluid in the right paracolic gutter, but no convincing diverticulitis is identified. Of note, the adjacent small bowel in the right abdomen appears normal. Another possible source for the free air would be the stomach as there is suggestion of gastric wall thickening at the level of the antrum and perigastric inflammation. The free air, however, is a fairly large distance from the stomach which makes the stomach a somewhat less likely source. 2. Diffuse colonic diverticulosis, most prominent within the sigmoid colon. 3. 2 mm nonobstructing left ureteral stone. Associated hydronephrosis. 4. Nodular consolidations within the right upper lobe and right lower lobe. 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 These are less likely acute pneumonia or neoplastic process. Non-contrast chest CT at 3-6 months is recommended. If the nodules are stable at time of repeat CT, then future CT at 18-24 months (from today's scan) is considered optional for low-risk patients, but is recommended for high-risk patients. This recommendation follows the consensus statement: Guidelines for Management of Incidental Pulmonary Nodules Detected on CT Images: From the Fleischner Society 2017; Radiology 2017; 284:228-243. 5. **An incidental finding of potential clinical significance has been found. Left adrenal mass measuring 1.9 cm, with Hounsfield unit measurement not compatible with benign lipid rich adrenal adenoma based on this noncontrast exam. Per consensus guidelines, this is a probably benign finding for which a follow-up adrenal protocol CT is recommended  in 12 months.** 6. Aortic atherosclerosis. 7. 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 If any associated radiculopathic symptoms, would consider nonemergent lumbar spine MRI to exclude associated nerve root impingement. At minimum, recommend close clinical follow-up and short-term follow-up CT to ensure stability or resolution of the acute/significant findings detailed above. Critical Value/emergent results were called by telephone at the time of interpretation on 12/25/2016 at 7:11 pm to Dr. Nira Conn RAI , who verbally acknowledged these results. Electronically Signed   By: Franki Cabot M.D.   On: 12/25/2016 19:31   US Renal  Result Date: 12/24/2016 CLINICAL DATA:  Acute kidney injury. EXAM: RENAL / URINARY  TRACT ULTRASOUND COMPLETE COMPARISON:  None. FINDINGS: Right Kidney: Length: 10.6 cm. Echogenicity within normal limits. No mass or hydronephrosis visualized. Left Kidney: Length: 10.4 cm. Echogenicity within normal limits. No mass or hydronephrosis visualized. Bladder: Appears normal for degree of bladder distention. IMPRESSION: Negative for hydronephrosis.  Normal exam. Electronically Signed   By: Inge Rise M.D.   On: 12/24/2016 12:05   Dg Chest Port 1 View  Result Date: 12/25/2016 CLINICAL DATA:  Sepsis. EXAM: PORTABLE CHEST 1 VIEW COMPARISON:  12/24/2016 and 10/29/2016 FINDINGS: Heart size and pulmonary vascularity are normal. Minimal atelectasis at both lung bases laterally. The lungs are otherwise clear. No bone abnormality. IMPRESSION: Minimal atelectasis at both lung bases. Electronically Signed   By: Lorriane Shire M.D.   On: 12/25/2016 10:19   Dg Chest Port 1 View  Result Date: 12/24/2016 CLINICAL DATA:  Confusion EXAM: PORTABLE CHEST 1 VIEW COMPARISON:  10/29/2016 FINDINGS: Normal heart size. Lungs are under aerated. Central and  basilar airspace disease. Vascular congestion. No pneumothorax. IMPRESSION: There is vascular congestion with central basilar hazy airspace disease. Electronically Signed   By: Marybelle Killings M.D.   On: 12/24/2016 11:32   Dg Abd Portable 1v  Result Date: 12/25/2016 CLINICAL DATA:  71 year old male on with bowel perforation. EXAM: PORTABLE ABDOMEN - 1 VIEW COMPARISON:  Abdominal CT dated 12/25/2016 FINDINGS: Oral contrast from prior CT is noted within the distal small bowel and colon. There is no bowel dilatation or evidence of obstruction. No large free air identified. Prostatectomy surgical clips noted within the pelvis. There is degenerative changes of the lumbar spine. No acute osseous pathology. IMPRESSION: 1. No bowel dilatation or evidence of obstruction. 2. No large free air. Electronically Signed   By: Anner Crete M.D.   On: 12/25/2016 19:46    Review of Systems  Constitutional: Positive for chills and fever. Negative for weight loss.  HENT: Negative for ear discharge, ear pain, hearing loss and tinnitus.   Eyes: Negative for blurred vision, double vision, photophobia and pain.  Respiratory: Negative for cough, sputum production and shortness of breath.   Cardiovascular: Negative for chest pain.  Gastrointestinal: Negative for abdominal pain, nausea and vomiting.  Genitourinary: Negative for dysuria, flank pain, frequency and urgency.  Musculoskeletal: Negative for back pain, falls, joint pain, myalgias and neck pain.  Neurological: Positive for weakness. Negative for dizziness, tingling, sensory change, focal weakness, loss of consciousness and headaches.  Endo/Heme/Allergies: Does not bruise/bleed easily.  Psychiatric/Behavioral: Negative for depression, memory loss and substance abuse. The patient is not nervous/anxious.    Blood pressure (!) 170/77, pulse 93, temperature 99 F (37.2 C), temperature source Oral, resp. rate 15, height '5\' 4"'$  (1.626 m), weight 81.4 kg (179 lb 7.3 oz),  SpO2 98 %. Physical Exam WDWN in NAD Eyes:  Pupils equal, round; sclera anicteric HENT:  Oral mucosa moist; good dentition  Neck:  No masses palpated, no thyromegaly Lungs:  CTA bilaterally; normal respiratory effort CV:  Regular rate and rhythm; no murmurs; extremities well-perfused with no edema Abd:  +bowel sounds, soft, protuberant; healed lower midline incision; non-tender; no palpable masses; no obvious hernias Skin:  Warm, dry; no sign of jaundice Psychiatric - alert and oriented x 4; calm mood and affect  Assessment/Plan: Unexplained sepsis - improved; nl WBC, nl lactate CT - several tiny bubbles of intraperitoneal air with free fluid in right paracolic gutter; possibly some mild thickening around the antrum.  Certainly, he appears well with no residual signs of sepsis.  With  his nausea, vomiting, and diarrhea last week, he may have perforated an ulcer vs. Perforated diverticulum, but currently has no signs of peritonitis requiring immediate surgery.   Recommend bowel rest, IV antibiotics Eventually, the patient should have EGD and colonoscopy by GI as an outpatient. Will recheck in AM.  If his condition deteriorates, he may require exploration.  Abdulkadir Emmanuel K. 12/25/2016, 8:30 PM

## 2016-12-25 NOTE — Progress Notes (Addendum)
[facility]                                                                              Patient Demographics  Jason [name], is a 71 y.o. male, DOB - 01/04/1946, YSA:[id number]  Admit date - 12/24/2016   Admitting Physician Edwin Dada, MD  Outpatient Primary MD for the patient is System, Pcp Not In  Outpatient specialists:   LOS - 1  days   Medical records reviewed and are as summarized below:    Chief Complaint  Patient presents with  . Hypotension       Brief summary   Patient is a 71 year old male with CAD, hypertension, hyperlipidemia, CVA, bipolar disease, anemia, GERD presented with sudden weakness, feelings of hot and cold for several weeks, seen in ED on 6/4. Patient presented to ED with extreme weakness, diaphoretic, feverish. Temp 96.3 at the time of admission, hypothermic, BP 101/82, tachycardiac 107, O2 sats 98% on room air Patient was admitted for possible sepsis due to stepdown unit Patient also reported nausea, vomiting, diarrhea for last 1 week. No abdominal pain.  Assessment & Plan    Principal Problem:   Sepsis ([place]) Unclear etiology however patient subsequently reported nausea, vomiting, diarrhea for last 1 week - Possibly could have led to severe dehydration and hypothermia, dizziness and generalized weakness - Lactic acid 2.79, check pro-calcitonin - Follow blood cultures, UA negative chest x-ray showed vascular condition with central basilar hazy airspace disease. - Continue IV vancomycin and cefepime. Obtain CT abdomen and pelvis, chest (pulmonary nodule seen on CT C-spine 6/410 mm).   Addendum:7:18pm Called by radiologist, per verbal report, patient appears to have some free area in abdomen possibly from colon (divericulitis ) vs stomach (gastric walls were thickened).   - official report pending, gave sign out to night coverage Dr Myna Hidalgo to call surgery consult for evaluation. Will make patient NPO, get KUB.   Active  Problems: Acute kidney injury on chronic kidney disease stage III: Baseline creatinine 1.6-2 - Creatinine 4.7 at the time of admission, likely due to profound dehydration from nausea, vomiting, diarrhea in the last 1 week, sepsis - Continue hydration, creatinine improving to 2.1 - Renal ultrasound showed no hydronephrosis or obstruction  Pulmonary nodule - CT cervical spine on 6/4 showed pulmonary nodule of right lung apex measuring 10 mm increased by 1 mL and compared to neck CT on 09/01/15 - will need outpatient workup and biopsy with PET scan, will discuss with pulmonology for the appointment  Lactic acidosis, dehydration - Improving, continue IV fluid hydration, likely worsened due to nausea vomiting diarrhea, poor oral intake - Advance diet to solids    Bipolar 1 disorder (HCC) - Continue Lamictal, Remeron, fluoxetine    HTN (hypertension) - Currently stable, continue prazosin  Prolonged QTC - Cardiology consulted, there was a cushion of possible atrial flutter on the EKG however per cardiology, it is sinus tachycardia secondary to elevated lactic acid and sepsis, recommended follow electrolytes and correct as needed    GERD (gastroesophageal reflux disease) - Continue PPI  Severe deconditioning  - PTOT evaluation   Code Status: Full CODE STATUS DVT  Prophylaxis:  heparin Family Communication: Discussed in detail with the patient, all imaging results, lab results explained to the patient   Disposition Plan: Stepdown today   Time Spent in minutes   35 minutes  Procedures:  None   Consultants:   Nephrology  Antimicrobials :   IV vancomycin 6/22    cefepime 6/22    Medications  Scheduled Meds: . aspirin  325 mg Oral Daily  . atorvastatin  40 mg Oral QHS  . feeding supplement (ENSURE ENLIVE)  237 mL Oral TID BM  . FLUoxetine  60 mg Oral Daily  . heparin  5,000 Units Subcutaneous Q8H  . lamoTRIgine  100 mg Oral QHS  . mirtazapine  45 mg Oral QHS  .  pantoprazole  80 mg Oral Daily  . prazosin  3 mg Oral QHS  . sodium chloride flush  3 mL Intravenous Q12H   Continuous Infusions: . sodium chloride 75 mL/hr at 12/25/16 0647  . ceFEPime (MAXIPIME) IV Stopped (12/25/16 9983)  . vancomycin Stopped (12/25/16 1157)   PRN Meds:.acetaminophen **OR** acetaminophen, hydrALAZINE, ondansetron **OR** ondansetron (ZOFRAN) IV   Antibiotics   Anti-infectives    Start     Dose/Rate Route Frequency Ordered Stop   12/26/16 0600  vancomycin (VANCOCIN) IVPB 1000 mg/200 mL premix  Status:  Discontinued     1,000 mg 200 mL/hr over 60 Minutes Intravenous Every 48 hours 12/24/16 0537 12/25/16 0950   12/25/16 1030  vancomycin (VANCOCIN) 1,250 mg in sodium chloride 0.9 % 250 mL IVPB     1,250 mg 166.7 mL/hr over 90 Minutes Intravenous Every 24 hours 12/25/16 0950     12/24/16 0600  ceFEPIme (MAXIPIME) 1 g in dextrose 5 % 50 mL IVPB     1 g 100 mL/hr over 30 Minutes Intravenous Every 24 hours 12/24/16 0525     12/24/16 0530  vancomycin (VANCOCIN) IVPB 1000 mg/200 mL premix     1,000 mg 200 mL/hr over 60 Minutes Intravenous  Once 12/24/16 0525 12/24/16 3825        Subjective:   Jason [name] was seen and examined today. Feeling somewhat better today, no nausea or vomiting this morning, wants to eat solid diet. States have not had much food over the last week. No diarrhea this morning. Overall feels he is improving.  Patient denies dizziness, chest pain, shortness of breath, new weakness, numbess, tingling.   Objective:   Vitals:   12/25/16 1100 12/25/16 1200 12/25/16 1206 12/25/16 1214  BP: (!) 174/91 (!) 185/89 (!) 177/81 (!) 171/87  Pulse: 90 92 88 87  Resp: 13 19 (!) 22 19  Temp:    98 F (36.7 C)  TempSrc:    Axillary  SpO2: 99% 98% 96% 98%  Weight:      Height:        Intake/Output Summary (Last 24 hours) at 12/25/16 1239 Last data filed at 12/25/16 1100  Gross per 24 hour  Intake          2471.25 ml  Output             1450 ml    Net          1021.25 ml     Wt Readings from Last 3 Encounters:  12/25/16 81.4 kg (179 lb 7.3 oz)  12/06/16 79.4 kg (175 lb 0.7 oz)  10/29/16 83 kg (182 lb 15.7 oz)     Exam  General: Alert and oriented x 3, NAD  Eyes: PERRLA, EOMI, Anicteric Sclera,  HEENT:  Atraumatic, normocephalic, normal oropharynx  Cardiovascular: S1 S2 auscultated, no rubs, murmurs or gallops. Regular rate and rhythm.1+ pedal edema   Respiratory: Clear to auscultation bilaterally, no wheezing, rales or rhonchi  Gastrointestinal: Soft, nontender, nondistended, + bowel sounds  Ext: 1+ pedal edema bilaterally  Neuro: AAOx3, Cr N's II- XII. Strength 5/5 upper and lower extremities bilaterally, speech clear, sensations grossly intact  Musculoskeletal: No digital cyanosis, clubbing  Skin: No rashes  Psych: Normal affect and demeanor, alert and oriented x3    Data Reviewed:  I have personally reviewed following labs and imaging studies  Micro Results Recent Results (from the past 240 hour(s))  Blood Culture (routine x 2)     Status: None (Preliminary result)   Collection Time: 12/24/16  5:38 AM  Result Value Ref Range Status   Specimen Description BLOOD RIGHT ARM  Final   Special Requests   Final    BOTTLES DRAWN AEROBIC AND ANAEROBIC Blood Culture adequate volume   Culture NO GROWTH 1 DAY  Final   Report Status PENDING  Incomplete  Blood Culture (routine x 2)     Status: None (Preliminary result)   Collection Time: 12/24/16  5:38 AM  Result Value Ref Range Status   Specimen Description BLOOD RIGHT HAND  Final   Special Requests IN PEDIATRIC BOTTLE Blood Culture adequate volume  Final   Culture NO GROWTH 1 DAY  Final   Report Status PENDING  Incomplete  MRSA PCR Screening     Status: None   Collection Time: 12/24/16  2:20 PM  Result Value Ref Range Status   MRSA by PCR NEGATIVE NEGATIVE Final    Comment:        The GeneXpert MRSA Assay (FDA approved for NASAL specimens only), is one  component of a comprehensive MRSA colonization surveillance program. It is not intended to diagnose MRSA infection nor to guide or monitor treatment for MRSA infections.     Radiology Reports Ct Head Wo Contrast  Result Date: 12/24/2016 CLINICAL DATA:  Confusion, sepsis, slurred speech EXAM: CT HEAD WITHOUT CONTRAST TECHNIQUE: Contiguous axial images were obtained from the base of the skull through the vertex without intravenous contrast. COMPARISON:  12/06/2016 FINDINGS: Brain: Stable brain atrophy pattern with chronic white matter microvascular ischemic changes throughout the cerebral hemispheres. Remote bilateral lacunar-type basilar ganglia infarctions and right cerebellar infarct. No acute intracranial hemorrhage, mass lesion, new infarction, focal mass effect or edema. Negative for herniation, midline shift, hydrocephalus, or extra-axial fluid collection. Cisterns are patent. Vascular: Atherosclerosis of the intracranial vessels. No hyperdense vessel demonstrated. Skull: Normal. Negative for fracture or focal lesion. Sinuses/Orbits: No acute finding. Other: None. IMPRESSION: Stable brain atrophy, chronic white matter microvascular ischemic changes and remote infarcts as above. No interval change or acute process by noncontrast CT. Electronically Signed   By: Jerilynn Mages.  Shick M.D.   On: 12/24/2016 07:53   Ct Cervical Spine Wo Contrast  Result Date: 12/06/2016 CLINICAL DATA:  Cervical neck pain after fall down stair case. EXAM: CT CERVICAL SPINE WITHOUT CONTRAST TECHNIQUE: Multidetector CT imaging of the cervical spine was performed without intravenous contrast. Multiplanar CT image reconstructions were also generated. COMPARISON:  No dedicated cervical spine imaging. FINDINGS: Alignment: Normal. Skull base and vertebrae: No acute fracture. Skullbase and dens are intact. Vertebral body heights are preserved. Soft tissues and spinal canal: No prevertebral fluid or swelling. No visible canal hematoma.  Disc levels: Disc space narrowing and endplate spurring is most prominent at C5-C6 with a posterior disc  osteophyte complex causing mild narrowing of the spinal canal. Scattered facet arthropathy. Upper chest: There is a 10 mm subpleural nodule at the right lung apex, previously 9 mm on neck CTA 09/01/2015. Other: Carotid calcifications. Encephalomalacia in the right cerebellum better characterized on brain MRI earlier this day. IMPRESSION: 1. Degenerative change in the cervical spine without acute fracture or subluxation. 2. A pulmonary nodule of the right lung apex measures 10 mm, increased by 1 mm compared to neck CTA 09/01/2015. Consider PET-CT characterization. Electronically Signed   By: Jeb Levering M.D.   On: 12/06/2016 23:16   Mr Virgel [place] KG Contrast  Result Date: 12/06/2016 CLINICAL DATA:  New onset left-sided weakness at 3 p.m. today. Abnormal speech. The examination had to be discontinued prior to completion due to need to stabilize the patient. EXAM: MRI HEAD WITHOUT CONTRAST MRA HEAD WITHOUT CONTRAST TECHNIQUE: Multiplanar, multiecho pulse sequences of the brain and surrounding structures were obtained without intravenous contrast. Angiographic images of the head were obtained using MRA technique without contrast. COMPARISON:  CT head without contrast from the same day. FINDINGS: MRI HEAD FINDINGS Brain: The diffusion-weighted images demonstrate no acute or subacute infarction. 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 Moderate confluent periventricular and scattered subcortical T2 hyperintensities are again noted bilaterally. White matter changes extend into the brainstem, similar to the prior study. No acute hemorrhage or mass lesion is present. The ventricles are proportionate to the degree of atrophy. No significant extra-axial fluid collection is present. Vascular: Flow is present in the major intracranial arteries. Skull and upper cervical spine: The  skullbase is unremarkable. Cervical spine is not imaged without sagittal T1 weighted imaging. Sinuses/Orbits: The paranasal sinuses and the mastoid air cells are clear. The globes and orbits are unremarkable. MRA HEAD FINDINGS The internal carotid artery is are within normal limits from the high cervical segments through the ICA termini bilaterally. The A1 and M1 segments are normal. Anterior communicating artery is patent. There is some attenuation of distal MCA branch vessels bilaterally. There is focal signal loss in the proximal anterior right M2 segment. This may reflect a focal stenosis versus artifact. More distal branches are intact, suggesting artifact. The left vertebral artery is the dominant vessel. Only the very distal right vertebral artery is seen. The more proximal vessel may be occluded or hypoplastic. The basilar artery is within normal limits. Both posterior cerebral arteries originate from basilar tip. There is asymmetric attenuation of distal PCA branch vessels on the left. IMPRESSION: 1. No acute intracranial abnormality. 2. Confluent periventricular and scattered subcortical T2 hyperintensities bilaterally are moderately advanced for age. 3. Question narrowing of the anterior right M2 division. 4. Mild distal small vessel disease without other significant proximal emergent large vessel occlusion. Electronically Signed   By: San Morelle M.D.   On: 12/06/2016 21:07   Mr Brain Wo Contrast  Result Date: 12/06/2016 CLINICAL DATA:  New onset left-sided weakness at 3 p.m. today. Abnormal speech. The examination had to be discontinued prior to completion due to need to stabilize the patient. EXAM: MRI HEAD WITHOUT CONTRAST MRA HEAD WITHOUT CONTRAST TECHNIQUE: Multiplanar, multiecho pulse sequences of the brain and surrounding structures were obtained without intravenous contrast. Angiographic images of the head were obtained using MRA technique without contrast. COMPARISON:  CT head  without contrast from the same day. FINDINGS: MRI HEAD FINDINGS Brain: The diffusion-weighted images demonstrate no acute or subacute infarction. A remote infarct of the posterior inferior right cerebellum is again  noted. Moderate confluent periventricular and scattered subcortical T2 hyperintensities are again noted bilaterally. White matter changes extend into the brainstem, similar to the prior study. No acute hemorrhage or mass lesion is present. The ventricles are proportionate to the degree of atrophy. No significant extra-axial fluid collection is present. Vascular: Flow is present in the major intracranial arteries. Skull and upper cervical spine: The skullbase is unremarkable. Cervical spine is not imaged without sagittal T1 weighted imaging. Sinuses/Orbits: The paranasal sinuses and the mastoid air cells are clear. The globes and orbits are unremarkable. MRA HEAD FINDINGS The internal carotid artery is are within normal limits from the high cervical segments through the ICA termini bilaterally. The A1 and M1 segments are normal. Anterior communicating artery is patent. There is some attenuation of distal MCA branch vessels bilaterally. There is focal signal loss in the proximal anterior right M2 segment. This may reflect a focal stenosis versus artifact. More distal branches are intact, suggesting artifact. The left vertebral artery is the dominant vessel. Only the very distal right vertebral artery is seen. The more proximal vessel may be occluded or hypoplastic. The basilar artery is within normal limits. Both posterior cerebral arteries originate from basilar tip. There is asymmetric attenuation of distal PCA branch vessels on the left. IMPRESSION: 1. No acute intracranial abnormality. 2. Confluent periventricular and scattered subcortical T2 hyperintensities bilaterally are moderately advanced for age. 3. Question narrowing of the anterior right M2 division. 4. Mild distal small vessel disease without  other significant proximal emergent large vessel occlusion. Electronically Signed   By: San Morelle M.D.   On: 12/06/2016 21:07   Mr Cervical Spine Wo Contrast  Result Date: 12/07/2016 CLINICAL DATA:  Initial evaluation for left-sided weakness, ataxia. EXAM: MRI CERVICAL, THORACIC SPINE WITHOUT CONTRAST TECHNIQUE: Multiplanar and multiecho pulse sequences of the cervical spine, to include the craniocervical junction and cervicothoracic junction, and thoracic spine, were obtained without intravenous contrast. COMPARISON:  None. FINDINGS: MRI CERVICAL SPINE FINDINGS Alignment: Straightening of the normal cervical lordosis. No listhesis. Vertebrae: Vertebral body heights are maintained. No evidence for acute or chronic fracture. Signal intensity within the vertebral body bone marrow is normal. No discrete or worrisome osseous lesions. No abnormal marrow edema. Cord: Signal intensity within the cervical spinal cord is normal. Posterior Fossa, vertebral arteries, paraspinal tissues: Encephalomalacia within the inferior right cerebellar hemisphere compatible with remote infarct. Probable chronic microvascular ischemic changes noted within the pons. Craniocervical junction normal. Paraspinous and prevertebral soft tissues within normal limits. Normal intravascular flow void present within the left vertebral artery. Right vertebral artery poorly visualized, and may be hypoplastic and/ or occluded. Disc levels: C2-C3: Mild diffuse disc bulge with bilateral uncovertebral hypertrophy, greater on the right. Resultant mild right C3 foraminal stenosis. No significant canal or left foraminal narrowing. C3-C4: Diffuse degenerative disc bulge with bilateral uncovertebral spurring. Mild bilateral facet hypertrophy. Resultant moderate bilateral C4 foraminal stenosis, slightly worse on the right. Posterior disc osteophyte flattens the ventral thecal sac without significant canal stenosis. C4-C5: Chronic diffuse degenerative  disc osteophyte with intervertebral disc space narrowing. Mild bilateral facet arthrosis, greater on the left. Broad posterior disc osteophyte flattens and partially faces the ventral thecal sac. Minimal flattening of the right hemi cord without cord signal changes. Resultant mild to moderate canal stenosis. Fairly severe bilateral C5 foraminal narrowing, left greater than right. C5-C6: Chronic diffuse degenerative disc osteophyte with intervertebral disc space narrowing. Bilateral facet degeneration with ligamentum flavum thickening. Broad posterior disc osteophyte flattens and largely effaces the ventral thecal  sac. Slight inferior migration of disc material noted (series 3, image 7). Resultant moderate canal stenosis. Mild cord flattening on the left without cord signal changes. Severe bilateral C6 foraminal narrowing, left worse than right. C6-C7: Chronic diffuse degenerative disc osteophyte with intervertebral disc space narrowing. Left-sided facet degeneration. Ligamentum flavum thickening. Left E centric disc osteophyte complex flattens and effaces the ventral thecal sac. There is flattening of the left hemi cord without cord signal changes. Resultant severe canal stenosis. Thecal sac measures 5 mm in AP diameter (series 7, image 23). Resultant fairly severe bilateral C7 foraminal stenosis, left worse than right. C7-T1: Minimal disc bulge. Mild facet hypertrophy. No significant stenosis. MRI THORACIC SPINE FINDINGS Alignment: Vertebral bodies normally aligned with preservation of the normal thoracic kyphosis. No listhesis. Vertebrae: Vertebral body heights are well maintained. No evidence for acute or chronic fracture. Signal intensity within the vertebral body bone marrow within normal limits. No discrete or worrisome osseous lesions. No abnormal marrow edema. Cord: Signal intensity within the thoracic spinal cord is normal. Conus medullaris terminates at the T12-L1 level. Paraspinal and other soft tissues:  Paraspinous soft tissues within normal limits. Partially visualized lungs are clear. Visualized visceral structures unremarkable. Disc levels: T1-2:  Left-sided facet degeneration.  No stenosis. T2-3: Right greater than left facet hypertrophy. Small left foraminal disc protrusion (series 11, image 10). No significant stenosis. T3-4:  Right greater than left facet degeneration.  No stenosis. T4-5:  Unremarkable. T5-6: Small central/ left central disc protrusion indents the ventral thecal sac. Posterior element hypertrophy. Resultant mild canal stenosis. T6-7: Right greater than left posterior element hypertrophy. No significant canal or foraminal stenosis. T7-8: Minimal disc bulge with posterior element hypertrophy. Resultant mild canal stenosis. No significant foraminal encroachment. T8-9: Diffuse disc bulge with superimposed right foraminal disc protrusion (Series 16, image 7). Posterior element hypertrophy. Mild flattening of the right hemi cord without cord signal changes. Mild canal stenosis. Mild bilateral foraminal narrowing. T9-10: Diffuse degenerative disc bulge. Prominent bilateral facet arthrosis. Ventral and posterior CSF is largely effaced with resultant moderate canal stenosis (series 16, image 11). No significant foraminal encroachment. T10-11: Small right central disc protrusion indents the ventral thecal sac. Posterior element hypertrophy. Mild canal and left foraminal stenosis. T11-12:  Mild facet hypertrophy.  No stenosis. T12-L1:  Unremarkable. IMPRESSION: MRI CERVICAL SPINE IMPRESSION: 1. Advanced multilevel degenerative spondylolysis as detailed above with resultant multilevel canal narrowing, most prevalent at the C5-6 and C6-7 levels where stenosis is moderate to severe in nature. No cord signal changes identified. 2. Multifactorial degenerative changes with advanced multilevel foraminal narrowing as above. Notable findings include moderate bilateral C4 foraminal stenosis, with severe  bilateral C5, C6, and C7 foraminal narrowing, overall worse on the left. 3. 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 MRI THORACIC SPINE IMPRESSION: 1. Moderate multilevel degenerative facet degeneration within the thoracic spine as above. Changes most pronounced at the T9-10 level were there is resultant moderate canal stenosis. Please see above report for a full description these findings. 2. No other acute abnormality within the thoracic spine. Electronically Signed   By: Jeannine Boga M.D.   On: 12/07/2016 03:52   Mr Thoracic Spine Wo Contrast  Result Date: 12/07/2016 CLINICAL DATA:  Initial evaluation for left-sided weakness, ataxia. EXAM: MRI CERVICAL, THORACIC SPINE WITHOUT CONTRAST TECHNIQUE: Multiplanar and multiecho pulse sequences of the cervical spine, to include the craniocervical junction and cervicothoracic junction, and thoracic spine, were obtained without intravenous contrast. COMPARISON:  None. FINDINGS: MRI CERVICAL  SPINE FINDINGS Alignment: Straightening of the normal cervical lordosis. No listhesis. Vertebrae: Vertebral body heights are maintained. No evidence for acute or chronic fracture. Signal intensity within the vertebral body bone marrow is normal. No discrete or worrisome osseous lesions. No abnormal marrow edema. Cord: Signal intensity within the cervical spinal cord is normal. Posterior Fossa, vertebral arteries, paraspinal tissues: Encephalomalacia within the inferior right cerebellar hemisphere compatible with remote infarct. Probable chronic microvascular ischemic changes noted within the pons. Craniocervical junction normal. Paraspinous and prevertebral soft tissues within normal limits. Normal intravascular flow void present within the left vertebral artery. Right vertebral artery poorly visualized, and may be hypoplastic and/ or occluded. Disc levels: C2-C3: Mild diffuse disc bulge with bilateral uncovertebral  hypertrophy, greater on the right. Resultant mild right C3 foraminal stenosis. No significant canal or left foraminal narrowing. C3-C4: Diffuse degenerative disc bulge with bilateral uncovertebral spurring. Mild bilateral facet hypertrophy. Resultant moderate bilateral C4 foraminal stenosis, slightly worse on the right. Posterior disc osteophyte flattens the ventral thecal sac without significant canal stenosis. C4-C5: Chronic diffuse degenerative disc osteophyte with intervertebral disc space narrowing. Mild bilateral facet arthrosis, greater on the left. Broad posterior disc osteophyte flattens and partially faces the ventral thecal sac. Minimal flattening of the right hemi cord without cord signal changes. Resultant mild to moderate canal stenosis. Fairly severe bilateral C5 foraminal narrowing, left greater than right. C5-C6: Chronic diffuse degenerative disc osteophyte with intervertebral disc space narrowing. Bilateral facet degeneration with ligamentum flavum thickening. Broad posterior disc osteophyte flattens and largely effaces the ventral thecal sac. Slight inferior migration of disc material noted (series 3, image 7). Resultant moderate canal stenosis. Mild cord flattening on the left without cord signal changes. Severe bilateral C6 foraminal narrowing, left worse than right. C6-C7: Chronic diffuse degenerative disc osteophyte with intervertebral disc space narrowing. Left-sided facet degeneration. Ligamentum flavum thickening. Left E centric disc osteophyte complex flattens and effaces the ventral thecal sac. There is flattening of the left hemi cord without cord signal changes. Resultant severe canal stenosis. Thecal sac measures 5 mm in AP diameter (series 7, image 23). Resultant fairly severe bilateral C7 foraminal stenosis, left worse than right. C7-T1: Minimal disc bulge. Mild facet hypertrophy. No significant stenosis. MRI THORACIC SPINE FINDINGS Alignment: Vertebral bodies normally aligned with  preservation of the normal thoracic kyphosis. No listhesis. Vertebrae: Vertebral body heights are well maintained. No evidence for acute or chronic fracture. Signal intensity within the vertebral body bone marrow within normal limits. No discrete or worrisome osseous lesions. No abnormal marrow edema. Cord: Signal intensity within the thoracic spinal cord is normal. Conus medullaris terminates at the T12-L1 level. Paraspinal and other soft tissues: Paraspinous soft tissues within normal limits. Partially visualized lungs are clear. Visualized visceral structures unremarkable. Disc levels: T1-2:  Left-sided facet degeneration.  No stenosis. T2-3: Right greater than left facet hypertrophy. Small left foraminal disc protrusion (series 11, image 10). No significant stenosis. T3-4:  Right greater than left facet degeneration.  No stenosis. T4-5:  Unremarkable. T5-6: Small central/ left central disc protrusion indents the ventral thecal sac. Posterior element hypertrophy. Resultant mild canal stenosis. T6-7: Right greater than left posterior element hypertrophy. No significant canal or foraminal stenosis. T7-8: Minimal disc bulge with posterior element hypertrophy. Resultant mild canal stenosis. No significant foraminal encroachment. T8-9: Diffuse disc bulge with superimposed right foraminal disc protrusion (Series 16, image 7). Posterior element hypertrophy. Mild flattening of the right hemi cord without cord signal changes. Mild canal stenosis. Mild bilateral foraminal narrowing. T9-10: Diffuse degenerative  disc bulge. Prominent bilateral facet arthrosis. Ventral and posterior CSF is largely effaced with resultant moderate canal stenosis (series 16, image 11). No significant foraminal encroachment. T10-11: Small right central disc protrusion indents the ventral thecal sac. Posterior element hypertrophy. Mild canal and left foraminal stenosis. T11-12:  Mild facet hypertrophy.  No stenosis. T12-L1:  Unremarkable.  IMPRESSION: MRI CERVICAL SPINE IMPRESSION: 1. Advanced multilevel degenerative spondylolysis as detailed above with resultant multilevel canal narrowing, most prevalent at the C5-6 and C6-7 levels where stenosis is moderate to severe in nature. No cord signal changes identified. 2. Multifactorial degenerative changes with advanced multilevel foraminal narrowing as above. Notable findings include moderate bilateral C4 foraminal stenosis, with severe bilateral C5, C6, and C7 foraminal narrowing, overall worse on the left. 3. 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 MRI THORACIC SPINE IMPRESSION: 1. Moderate multilevel degenerative facet degeneration within the thoracic spine as above. Changes most pronounced at the T9-10 level were there is resultant moderate canal stenosis. Please see above report for a full description these findings. 2. No other acute abnormality within the thoracic spine. Electronically Signed   By: Jeannine Boga M.D.   On: 12/07/2016 03:52   US Renal  Result Date: 12/24/2016 CLINICAL DATA:  Acute kidney injury. EXAM: RENAL / URINARY TRACT ULTRASOUND COMPLETE COMPARISON:  None. FINDINGS: Right Kidney: Length: 10.6 cm. Echogenicity within normal limits. No mass or hydronephrosis visualized. Left Kidney: Length: 10.4 cm. Echogenicity within normal limits. No mass or hydronephrosis visualized. Bladder: Appears normal for degree of bladder distention. IMPRESSION: Negative for hydronephrosis.  Normal exam. Electronically Signed   By: Inge Rise M.D.   On: 12/24/2016 12:05   Dg Chest Port 1 View  Result Date: 12/24/2016 CLINICAL DATA:  Confusion EXAM: PORTABLE CHEST 1 VIEW COMPARISON:  10/29/2016 FINDINGS: Normal heart size. Lungs are under aerated. Central and basilar airspace disease. Vascular congestion. No pneumothorax. IMPRESSION: There is vascular congestion with central basilar hazy airspace disease. Electronically Signed    By: Marybelle Killings M.D.   On: 12/24/2016 11:32   Ct Head Code Stroke W/o Cm  Result Date: 12/06/2016 CLINICAL DATA:  Code stroke. New onset gait problems. Last seen well at 3 p.m. today. EXAM: CT HEAD WITHOUT CONTRAST TECHNIQUE: Contiguous axial images were obtained from the base of the skull through the vertex without intravenous contrast. COMPARISON:  CT head without contrast 10/14/2015. MRI brain 08/30/2015. FINDINGS: Brain: Confluent periventricular white matter hypoattenuation bilaterally is similar the prior study. Basal ganglia are stable. No acute cortical infarct, hemorrhage, or mass lesion is present. Remote infarct of the right cerebellum and remote lacunar infarcts of the basal ganglia bilaterally are stable. Vascular: Atherosclerotic calcifications within the cavernous internal carotid artery's are stable. There is no hyperdense vessel. Calcifications are also present at the dural margin of the left vertebral artery. Skull: Calvarium is intact. No focal lytic or blastic lesions are present. Sinuses/Orbits: The paranasal sinuses and mastoid air cells are clear. ASPECTS [facility] Stroke Program Early CT Score) - Ganglionic level infarction (caudate, lentiform nuclei, internal capsule, insula, M1-M3 cortex): 7/7 - Supraganglionic infarction (M4-M6 cortex): 3/3 Total score (0-10 with 10 being normal): 10/10 IMPRESSION: 1. No acute intracranial abnormality or significant interval change. 2. Stable atrophy and white matter disease. 3. Stable remote lacunar infarcts involving the basal ganglia and periventricular white matter. 4. Remote infarct of the right inferior cerebellum is stable. 5. ASPECTS is 10/10 These results were text paged at the time of interpretation on 12/06/2016  at 8:08 pm to Dr. Leonel Ramsay. Electronically Signed   By: San Morelle M.D.   On: 12/06/2016 20:09    Lab Data:  CBC:  Recent Labs Lab 12/24/16 0515 12/24/16 0516 12/24/16 0813 12/25/16 0351  WBC 14.3*  --  15.3*  8.6  NEUTROABS 12.8*  --   --   --   HGB 12.1* 12.9* 11.9* 10.2*  HCT 38.3* 38.0* 37.0* 31.9*  MCV 93.6  --  93.0 92.5  PLT 275  --  284 269   Basic Metabolic Panel:  Recent Labs Lab 12/24/16 0515 12/24/16 0516 12/24/16 0813 12/25/16 0351  NA 141 141  --  141  K 3.9 3.9  --  3.6  CL 103 101  --  110  CO2 23  --   --  24  GLUCOSE 115* 109*  --  98  BUN 37* 38*  --  27*  CREATININE 4.72* 4.70* 4.16* 2.15*  CALCIUM 8.4*  --   --  8.3*  MG  --   --  1.8  --   PHOS  --   --  5.3*  --    GFR: Estimated Creatinine Clearance: 31.2 mL/min (A) (by C-G formula based on SCr of 2.15 mg/dL (H)). Liver Function Tests:  Recent Labs Lab 12/24/16 0515  AST 20  ALT 16*  ALKPHOS 99  BILITOT 0.2*  PROT 6.4*  ALBUMIN 4.0   No results for input(s): LIPASE, AMYLASE in the last 168 hours. No results for input(s): AMMONIA in the last 168 hours. Coagulation Profile:  Recent Labs Lab 12/24/16 0515  INR 1.05   Cardiac Enzymes: No results for input(s): CKTOTAL, CKMB, CKMBINDEX, TROPONINI in the last 168 hours. BNP (last 3 results) No results for input(s): PROBNP in the last 8760 hours. HbA1C: No results for input(s): HGBA1C in the last 72 hours. CBG:  Recent Labs Lab 12/24/16 0907  GLUCAP 110*   Lipid Profile: No results for input(s): CHOL, HDL, LDLCALC, TRIG, CHOLHDL, LDLDIRECT in the last 72 hours. Thyroid Function Tests: No results for input(s): TSH, T4TOTAL, FREET4, T3FREE, THYROIDAB in the last 72 hours. Anemia Panel: No results for input(s): VITAMINB12, FOLATE, FERRITIN, TIBC, IRON, RETICCTPCT in the last 72 hours. Urine analysis:    Component Value Date/Time   COLORURINE YELLOW 12/24/2016 1418   APPEARANCEUR HAZY (A) 12/24/2016 1418   LABSPEC 1.014 12/24/2016 1418   PHURINE 5.0 12/24/2016 1418   GLUCOSEU NEGATIVE 12/24/2016 1418   HGBUR MODERATE (A) 12/24/2016 1418   BILIRUBINUR NEGATIVE 12/24/2016 1418   KETONESUR NEGATIVE 12/24/2016 1418   PROTEINUR 30 (A)  12/24/2016 1418   UROBILINOGEN 0.2 05/09/2014 2115   NITRITE NEGATIVE 12/24/2016 1418   LEUKOCYTESUR NEGATIVE 12/24/2016 1418     Rawson Minix M.D. [facility] 12/25/2016, 12:39 PM  Pager: [phone] Between 7am to 7pm - call Pager - [phone]  After 7pm go to [email or web address] - password [email or web address]  Call night coverage person covering after 7pm

## 2016-12-25 NOTE — Progress Notes (Signed)
Pharmacy Antibiotic Note  Jason [name] is a 71 y.o. male admitted on 12/24/2016 with sepsis.  Pharmacy has been consulted for Vancomycin dosing. Presents to ED "feeling bad". WBC  15.3>>8.6. Noted to be in acute on chronic renal failure on admission but SCr now improved 4.7>4.16>>2.15 (baseline ~2.3). Afebrile.  Plan: -Vancomycin 1250 mg IV q24h -Cefepime 1g IV q24h per MD -Adjust doses as need based on renal function trend -Drug levels as indicated   6/22 BCx: ngtd 6/22 MSRA PCR: negative  Height: 5\' 4"  (162.6 cm) Weight: 179 lb 7.3 oz (81.4 kg) IBW/kg (Calculated) : 59.2  Temp (24hrs), Avg:98.1 F (36.7 C), Min:97.9 F (36.6 C), Max:98.3 F (36.8 C)   Recent Labs Lab 12/24/16 0515 12/24/16 0516 12/24/16 0813 12/24/16 1044 12/25/16 0351  WBC 14.3*  --  15.3*  --  8.6  CREATININE 4.72* 4.70* 4.16*  --  2.15*  LATICACIDVEN  --  2.79* 1.1 0.7  --     Estimated Creatinine Clearance: 31.2 mL/min (A) (by C-G formula based on SCr of 2.15 mg/dL (H)).    Allergies  Allergen Reactions  . Penicillins Other (See Comments)    Tested positive for this Has patient had a PCN reaction causing immediate rash, facial/tongue/throat swelling, SOB or lightheadedness with hypotension: Unknown Has patient had a PCN reaction causing severe rash involving mucus membranes or skin necrosis: Unknown Has patient had a PCN reaction that required hospitalization: Unknown Has patient had a PCN reaction occurring within the last 10 years: No If all of the above answers are "NO", then may proceed with Cephalosporin use.     Myer Peer Grayland Ormond), PharmD  [id number] Pharmacy Resident Pager: [phone] 12/25/2016 9:51 AM

## 2016-12-25 NOTE — Progress Notes (Signed)
Pharmacy: Ceftriaxone  70yom currently on vancomycin and cefepime for sepsis, now with possible perforated colon. Pharmacy asked to change cefepime to ceftriaxone and metronidazole. Noted PCN allergy but has been tolerating cefepime. Vancomycin to continue as ordered.  6/22 BCx: ngtd 6/22 MSRA PCR: negative  Vancomycin 6/22 >> Cefepime 6/22 >> 6/23 Ceftriaxone 6/23 >> Metronidazole 6/23 >>  Plan: 1) Ceftriaxone 1g IV q24 2) Metronidazole 500mg  IV q8  Nena Jordan, PharmD, BCPS 12/25/2016, 7:33 PM

## 2016-12-25 NOTE — Progress Notes (Signed)
Initial Nutrition Assessment  DOCUMENTATION CODES:   Non-severe (moderate) malnutrition in context of chronic illness  INTERVENTION:    Ensure Enlive po TID, each supplement provides 350 kcal and 20 grams of protein  NUTRITION DIAGNOSIS:   Malnutrition (moderate) related to chronic illness (heart DZ, depression, stroke) as evidenced by mild depletion of body fat, mild depletion of muscle mass.  GOAL:   Patient will meet greater than or equal to 90% of their needs  MONITOR:   PO intake, Supplement acceptance  REASON FOR ASSESSMENT:   Malnutrition Screening Tool    ASSESSMENT:   71 yo male with PMH of HTN, depression, bipolar D/O, PTSD, back pain, hearing loss, GERD, prostate cancer, stroke, TIA who was admitted on 6/22 with sudden weakness.  Patient reports recent decreased appetite for a few days, but intake prior to that was good. He requests chocolate Ensure TID. Currently on full liquids, wants solid food. Diet advancing to heart healthy today. Nutrition-Focused physical exam completed. Findings are mild-moderate fat depletion, mild-moderate muscle depletion, and no edema.  Weight is down by 2% in the last 2 months. Labs reviewed. Medications reviewed and include Remeron.  Diet Order:  Diet Heart Room service appropriate? Yes; Fluid consistency: Thin  Skin:  Reviewed, no issues  Last BM:  PTA  Height:   Ht Readings from Last 1 Encounters:  12/24/16 5\' 4"  (1.626 m)    Weight:   Wt Readings from Last 1 Encounters:  12/25/16 179 lb 7.3 oz (81.4 kg)    Ideal Body Weight:  59.1 kg  BMI:  Body mass index is 30.8 kg/m.  Estimated Nutritional Needs:   Kcal:  1700-1900  Protein:  80-90 gm  Fluid:  1.8 L  EDUCATION NEEDS:   Education needs addressed  Molli Barrows, [place], [place], [place] Pager [phone] After Hours Pager [phone]

## 2016-12-25 NOTE — Progress Notes (Signed)
[facility] Progress Note    Assessment/ Plan:   1.  Acute on chronic kidney injury: likely hemodynamically mediated in setting of sepsis.  Making urine.  Baseline appears to be in the low 2s.  Never has seen a nephrologist.  Renal US with smaller side kidneys.  Will get UA/ UPC.  Don't think a big workup is necessary at this point.  He is getting markedly better with appropriate fluid resuscitation.  UP/C pending.    2.  Chills/sweats/sepsis: unclear source; he does in fact have hardware in the back so ? If this is an occult source.  On antibiotics.  Has had n/v/d before admission so perhaps this is the cause of everything.  3.  HTN: very labile BP s according to his wife.  Will get renal dopplers (not to be done until Monday but if pt d/c'd before then can get as outpatient), ARR to start.  4.  S/p CVA.  With residual deficits.  5.  Pulm nodule: needs f/u  Subjective:    No acute events.  Feeling much better.  Creatinine is better.     Objective:   BP (!) 171/87 (BP Location: Left Arm)   Pulse 87   Temp 98 F (36.7 C) (Axillary)   Resp 19   Ht 5\' 4"  (1.626 m)   Wt 81.4 kg (179 lb 7.3 oz)   SpO2 98%   BMI 30.80 kg/m   Intake/Output Summary (Last 24 hours) at 12/25/16 1221 Last data filed at 12/25/16 1100  Gross per 24 hour  Intake          2471.25 ml  Output             1450 ml  Net          1021.25 ml   Weight change: 2.02 kg (4 lb 7.3 oz)  Physical Exam: GEN older gentleman, NAD HEENT MMM NECK bilateral carotid bruits CV RRR soft systolic murmur PULM clear bilaterally ABD nondistended, nontender EXT 1+ LE edema NEURO AAO x3 today Imaging: Ct Head Wo Contrast  Result Date: 12/24/2016 CLINICAL DATA:  Confusion, sepsis, slurred speech EXAM: CT HEAD WITHOUT CONTRAST TECHNIQUE: Contiguous axial images were obtained from the base of the skull through the vertex without intravenous contrast. COMPARISON:  12/06/2016 FINDINGS: Brain: Stable brain  atrophy pattern with chronic white matter microvascular ischemic changes throughout the cerebral hemispheres. Remote bilateral lacunar-type basilar ganglia infarctions and right cerebellar infarct. No acute intracranial hemorrhage, mass lesion, new infarction, focal mass effect or edema. Negative for herniation, midline shift, hydrocephalus, or extra-axial fluid collection. Cisterns are patent. Vascular: Atherosclerosis of the intracranial vessels. No hyperdense vessel demonstrated. Skull: Normal. Negative for fracture or focal lesion. Sinuses/Orbits: No acute finding. Other: None. IMPRESSION: Stable brain atrophy, chronic white matter microvascular ischemic changes and remote infarcts as above. No interval change or acute process by noncontrast CT. Electronically Signed   By: Jerilynn Mages.  Shick M.D.   On: 12/24/2016 07:53   US Renal  Result Date: 12/24/2016 CLINICAL DATA:  Acute kidney injury. EXAM: RENAL / URINARY TRACT ULTRASOUND COMPLETE COMPARISON:  None. FINDINGS: Right Kidney: Length: 10.6 cm. Echogenicity within normal limits. No mass or hydronephrosis visualized. Left Kidney: Length: 10.4 cm. Echogenicity within normal limits. No mass or hydronephrosis visualized. Bladder: Appears normal for degree of bladder distention. IMPRESSION: Negative for hydronephrosis.  Normal exam. Electronically Signed   By: Inge Rise M.D.   On: 12/24/2016 12:05   Dg Chest Port 1 View  Result Date:  12/24/2016 CLINICAL DATA:  Confusion EXAM: PORTABLE CHEST 1 VIEW COMPARISON:  10/29/2016 FINDINGS: Normal heart size. Lungs are under aerated. Central and basilar airspace disease. Vascular congestion. No pneumothorax. IMPRESSION: There is vascular congestion with central basilar hazy airspace disease. Electronically Signed   By: Marybelle Killings M.D.   On: 12/24/2016 11:32    Labs: BMET  Recent Labs Lab 12/24/16 0515 12/24/16 0516 12/24/16 0813 12/25/16 0351  NA 141 141  --  141  K 3.9 3.9  --  3.6  CL 103 101  --  110   CO2 23  --   --  24  GLUCOSE 115* 109*  --  98  BUN 37* 38*  --  27*  CREATININE 4.72* 4.70* 4.16* 2.15*  CALCIUM 8.4*  --   --  8.3*  PHOS  --   --  5.3*  --    CBC  Recent Labs Lab 12/24/16 0515 12/24/16 0516 12/24/16 0813 12/25/16 0351  WBC 14.3*  --  15.3* 8.6  NEUTROABS 12.8*  --   --   --   HGB 12.1* 12.9* 11.9* 10.2*  HCT 38.3* 38.0* 37.0* 31.9*  MCV 93.6  --  93.0 92.5  PLT 275  --  284 243    Medications:    . aspirin  325 mg Oral Daily  . atorvastatin  40 mg Oral QHS  . feeding supplement (ENSURE ENLIVE)  237 mL Oral TID BM  . FLUoxetine  60 mg Oral Daily  . heparin  5,000 Units Subcutaneous Q8H  . lamoTRIgine  100 mg Oral QHS  . mirtazapine  45 mg Oral QHS  . pantoprazole  80 mg Oral Daily  . prazosin  3 mg Oral QHS  . sodium chloride flush  3 mL Intravenous Q12H      Madelon Lips MD [facility] pgr [phone] 12/25/2016, 12:21 PM

## 2016-12-26 ENCOUNTER — Inpatient Hospital Stay (HOSPITAL_COMMUNITY): Payer: Non-veteran care

## 2016-12-26 LAB — BASIC METABOLIC PANEL
Anion gap: 6 (ref 5–15)
BUN: 18 mg/dL (ref 6–20)
CHLORIDE: 109 mmol/L (ref 101–111)
CO2: 26 mmol/L (ref 22–32)
Calcium: 8.5 mg/dL — ABNORMAL LOW (ref 8.9–10.3)
Creatinine, Ser: 1.42 mg/dL — ABNORMAL HIGH (ref 0.61–1.24)
GFR calc Af Amer: 57 mL/min — ABNORMAL LOW (ref >60)
GFR calc non Af Amer: 49 mL/min — ABNORMAL LOW (ref >60)
GLUCOSE: 99 mg/dL (ref 65–99)
POTASSIUM: 3.6 mmol/L (ref 3.5–5.1)
Sodium: 141 mmol/L (ref 135–145)

## 2016-12-26 LAB — CBC
HEMATOCRIT: 31.1 % — AB (ref 39.0–52.0)
HEMOGLOBIN: 10.1 g/dL — AB (ref 13.0–17.0)
MCH: 30 pg (ref 26.0–34.0)
MCHC: 32.5 g/dL (ref 30.0–36.0)
MCV: 92.3 fL (ref 78.0–100.0)
PLATELETS: 240 10*3/uL (ref 150–400)
RBC: 3.37 MIL/uL — ABNORMAL LOW (ref 4.22–5.81)
RDW: 13 % (ref 11.5–15.5)
WBC: 10.2 10*3/uL (ref 4.0–10.5)

## 2016-12-26 MED ORDER — PROMETHAZINE HCL 25 MG/ML IJ SOLN
12.5000 mg | Freq: Four times a day (QID) | INTRAMUSCULAR | Status: DC | PRN
  Administered 2016-12-27: 12.5 mg via INTRAVENOUS
  Filled 2016-12-26 (×2): qty 1

## 2016-12-26 MED ORDER — PANTOPRAZOLE SODIUM 40 MG IV SOLR
40.0000 mg | Freq: Two times a day (BID) | INTRAVENOUS | Status: DC
  Administered 2016-12-27 – 2016-12-28 (×3): 40 mg via INTRAVENOUS
  Filled 2016-12-26 (×3): qty 40

## 2016-12-26 MED ORDER — GI COCKTAIL ~~LOC~~
30.0000 mL | Freq: Two times a day (BID) | ORAL | Status: DC | PRN
  Administered 2016-12-26: 30 mL via ORAL
  Filled 2016-12-26: qty 30

## 2016-12-26 MED ORDER — HYDROMORPHONE HCL 1 MG/ML IJ SOLN: 1.0000 mg | INTRAMUSCULAR | Status: DC | PRN

## 2016-12-26 NOTE — Progress Notes (Signed)
[facility]                                                                              Patient Demographics  Jason [name], is a 71 y.o. male, DOB - 11/24/1945, ZOX:[id number]  Admit date - 12/24/2016   Admitting Physician Edwin Dada, MD  Outpatient Primary MD for the patient is System, Pcp Not In  Outpatient specialists:   LOS - 2  days   Medical records reviewed and are as summarized below:    Chief Complaint  Patient presents with  . Hypotension       Brief summary   Patient is a 71 year old male with CAD, hypertension, hyperlipidemia, CVA, bipolar disease, anemia, GERD presented with sudden weakness, feelings of hot and cold for several weeks, seen in ED on 6/4. Patient presented to ED with extreme weakness, diaphoretic, feverish. Temp 96.3 at the time of admission, hypothermic, BP 101/82, tachycardiac 107, O2 sats 98% on room air Patient was admitted for possible sepsis due to stepdown unit Patient also reported nausea, vomiting, diarrhea for last 1 week. No abdominal pain.  Assessment & Plan    Principal Problem:   Sepsis ([place]) : Likely due to the perforated colon/diverticulum versus gastric ulcer - Presented with nausea, vomiting and diarrhea for last 1 week, lactic acidosis  - Blood cultures negative to date, UA negative, chest x-ray with vascular congestion and central basilar hazy airspace disease - - Follow blood cultures, UA negative chest x-ray showed vascular condition with central basilar hazy airspace disease.  Active problems Right lower quadrant free air due to perforated right colon diverticulum versus perforated gastric ulcer -Possibly the cause of #1, had nausea, vomiting and diarrhea for last 1 week PTA - Nontender, Continue bowel rest, IV fluids - Appreciate general surgery recommendations - Outpatient EGD, colonoscopy by GI recommended by surgery  - Antibiotic changed to Rocephin and Flagyl   Acute kidney injury  on chronic kidney disease stage III: Baseline creatinine 1.6-2 - Creatinine 4.7 at the time of admission, likely due to profound dehydration from nausea, vomiting, diarrhea in the last 1 week, sepsis - Renal ultrasound showed no hydronephrosis or obstruction - Creatinine continues to improve, 1.4, nephrology signed off  Pulmonary nodule - CT cervical spine on 6/4 showed pulmonary nodule of right lung apex measuring 10 mm increased by 1 mL and compared to neck CT on 09/01/15 -  CT chest showed nodular consolidations in the right upper lobe and right lower lobe favored to represent chronic areas of nodular scarring/fibrosis, less likely to be acute pneumonia or neoplastic process. - Noncontrast chest chest CT recommended in 3-6 months.  Lactic acidosis, dehydration -  resolved with IV fluid hydration    Bipolar 1 disorder (HCC) - Continue Lamictal, Remeron, fluoxetine    HTN (hypertension) - Currently stable, continue prazosin  Prolonged QTC - Cardiology was consulted, there was a question of possible atrial flutter on the EKG however per cardiology, it is sinus tachycardia secondary to elevated lactic acid and sepsis, recommended follow electrolytes and correct as needed    GERD (gastroesophageal reflux disease) - Continue PPI  Severe deconditioning  -  PTOT evaluation   Code Status: Full CODE STATUS DVT Prophylaxis:  Heparin sq Family Communication: Discussed in detail with the patient, all imaging results, lab results explained to the patient   Disposition Plan: Stepdown today   Time Spent in minutes   35 minutes  Procedures:  None   Consultants:   Nephrology General surgery   Antimicrobials :   IV vancomycin 6/22 > 6/23   cefepime 6/22> 6/23  IV Rocephin 6/23>>  IV Flagyl 6/23>>   Medications  Scheduled Meds: . atorvastatin  40 mg Oral QHS  . feeding supplement (ENSURE ENLIVE)  237 mL Oral TID BM  . FLUoxetine  60 mg Oral Daily  . heparin  5,000 Units  Subcutaneous Q8H  . lamoTRIgine  100 mg Oral QHS  . mirtazapine  45 mg Oral QHS  . pantoprazole  80 mg Oral Daily  . prazosin  3 mg Oral QHS  . sodium chloride flush  3 mL Intravenous Q12H   Continuous Infusions: . sodium chloride 75 mL/hr at 12/25/16 2147  . cefTRIAXone (ROCEPHIN)  IV Stopped (12/25/16 2101)  . metronidazole Stopped (12/26/16 0454)  . vancomycin Stopped (12/25/16 1157)   PRN Meds:.acetaminophen **OR** acetaminophen, hydrALAZINE, ondansetron **OR** ondansetron (ZOFRAN) IV, oxyCODONE   Antibiotics   Anti-infectives    Start     Dose/Rate Route Frequency Ordered Stop   12/26/16 0600  vancomycin (VANCOCIN) IVPB 1000 mg/200 mL premix  Status:  Discontinued     1,000 mg 200 mL/hr over 60 Minutes Intravenous Every 48 hours 12/24/16 0537 12/25/16 0950   12/25/16 2030  metroNIDAZOLE (FLAGYL) IVPB 500 mg     500 mg 100 mL/hr over 60 Minutes Intravenous Every 8 hours 12/25/16 1924     12/25/16 2000  cefTRIAXone (ROCEPHIN) 1 g in dextrose 5 % 50 mL IVPB     1 g 100 mL/hr over 30 Minutes Intravenous Every 24 hours 12/25/16 1930     12/25/16 1030  vancomycin (VANCOCIN) 1,250 mg in sodium chloride 0.9 % 250 mL IVPB     1,250 mg 166.7 mL/hr over 90 Minutes Intravenous Every 24 hours 12/25/16 0950     12/24/16 0600  ceFEPIme (MAXIPIME) 1 g in dextrose 5 % 50 mL IVPB  Status:  Discontinued     1 g 100 mL/hr over 30 Minutes Intravenous Every 24 hours 12/24/16 0525 12/25/16 1923   12/24/16 0530  vancomycin (VANCOCIN) IVPB 1000 mg/200 mL premix     1,000 mg 200 mL/hr over 60 Minutes Intravenous  Once 12/24/16 0525 12/24/16 1696        Subjective:   Jason [name] was seen and examined today. Feeling better, denies any specific complaints, no fevers or chills, no nausea or vomiting. No diarrhea.  No abdominal pain.  Patient denies dizziness, chest pain, shortness of breath, new weakness, numbess, tingling.   Objective:   Vitals:   12/26/16 0400 12/26/16 0405 12/26/16  0736 12/26/16 0800  BP: (!) 146/70  (!) 144/79 (!) 141/61  Pulse: 81  77 80  Resp: 18  16 15   Temp: 99.1 F (37.3 C)  98.1 F (36.7 C)   TempSrc: Oral  Oral   SpO2: 94%  96% 93%  Weight:  80.1 kg (176 lb 9.4 oz)    Height:        Intake/Output Summary (Last 24 hours) at 12/26/16 1024 Last data filed at 12/26/16 0800  Gross per 24 hour  Intake          2166.25 ml  Output              650 ml  Net          1516.25 ml     Wt Readings from Last 3 Encounters:  12/26/16 80.1 kg (176 lb 9.4 oz)  12/06/16 79.4 kg (175 lb 0.7 oz)  10/29/16 83 kg (182 lb 15.7 oz)     Exam  General: Alert and oriented x 3, NAD  Eyes:PERRLA, EOMI   HEENT: normocephalic atraumatic   Cardiovascular:  S1 and S2 clear, RRR, and 1+ pedal edema   Respiratory:Decreased breath sound at the bases   Gastrointestinal: Soft, NT, ND, no peritoneal signs   Ext: 1+ pedal edema bilaterally  Neuro: no focal neurological deficits   Musculoskeletal: No cyanosis or clubbing   Skin: No rashes  Psych: Alert and oriented 3, normal affect   Data Reviewed:  I have personally reviewed following labs and imaging studies  Micro Results Recent Results (from the past 240 hour(s))  Blood Culture (routine x 2)     Status: None (Preliminary result)   Collection Time: 12/24/16  5:38 AM  Result Value Ref Range Status   Specimen Description BLOOD RIGHT ARM  Final   Special Requests   Final    BOTTLES DRAWN AEROBIC AND ANAEROBIC Blood Culture adequate volume   Culture NO GROWTH 1 DAY  Final   Report Status PENDING  Incomplete  Blood Culture (routine x 2)     Status: None (Preliminary result)   Collection Time: 12/24/16  5:38 AM  Result Value Ref Range Status   Specimen Description BLOOD RIGHT HAND  Final   Special Requests IN PEDIATRIC BOTTLE Blood Culture adequate volume  Final   Culture NO GROWTH 1 DAY  Final   Report Status PENDING  Incomplete  MRSA PCR Screening     Status: None   Collection Time:  12/24/16  2:20 PM  Result Value Ref Range Status   MRSA by PCR NEGATIVE NEGATIVE Final    Comment:        The GeneXpert MRSA Assay (FDA approved for NASAL specimens only), is one component of a comprehensive MRSA colonization surveillance program. It is not intended to diagnose MRSA infection nor to guide or monitor treatment for MRSA infections.   Culture, blood (routine x 2)     Status: None (Preliminary result)   Collection Time: 12/25/16  8:15 PM  Result Value Ref Range Status   Specimen Description BLOOD LEFT HAND  Final   Special Requests   Final    BOTTLES DRAWN AEROBIC ONLY Blood Culture results may not be optimal due to an inadequate volume of blood received in culture bottles   Culture PENDING  Incomplete   Report Status PENDING  Incomplete    Radiology Reports Ct Abdomen Pelvis Wo Contrast  Result Date: 12/25/2016 CLINICAL DATA:  Patient has N/V/D for about 1 week with fever and possible sepsis. Per patient he does not have any complaints excepted he is real weak, looking for source of infection" EXAM: CT CHEST, ABDOMEN AND PELVIS WITHOUT CONTRAST TECHNIQUE: Multidetector CT imaging of the chest, abdomen and pelvis was performed following the standard protocol without IV contrast. COMPARISON:  None. FINDINGS: CT CHEST FINDINGS Cardiovascular: Scattered atherosclerosis of the normal caliber thoracic aorta. Heart size is normal. No pericardial effusion. Coronary artery calcifications noted. Mediastinum/Nodes: No mass or enlarged lymph nodes within the mediastinum or perihilar regions. Esophagus is unremarkable. Trachea and central bronchi are unremarkable. Lungs/Pleura: 1 cm nodular  consolidation in the right lung apex (series 5, image 25). 1.4 cm nodular consolidation at the right lung apex, most likely nodular scarring/atelectasis as it is contiguous with adjacent linear scarring/atelectasis (image 114). Left lung is clear. No pleural effusion or pneumothorax. Musculoskeletal:  Mild degenerative spurring within the thoracic spine. No acute or suspicious osseous finding. Superficial soft tissues are unremarkable. CT ABDOMEN PELVIS FINDINGS Hepatobiliary: No focal liver abnormality is seen. No gallstones, gallbladder wall thickening, or biliary dilatation. Pancreas: Unremarkable. No pancreatic ductal dilatation or surrounding inflammatory changes. Spleen: Normal in size without focal abnormality. Adrenals/Urinary Tract: Left adrenal mass measuring 1.9 cm, Hounsfield unit measurement of 21. Right adrenal gland appears normal. Kidneys appear normal without mass, stone or hydronephrosis. 2 mm stone within the proximal left ureter. Bladder is decompressed. Stomach/Bowel: Bowel is normal in caliber. Extensive diverticulosis of the sigmoid colon. Moderate degree of diverticulosis throughout the remainder of the colon. Vascular/Lymphatic: Aortic atherosclerosis. No enlarged lymph nodes seen. Reproductive: Status post prostatectomy. Other: Small foci of free intraperitoneal air within the lower right abdomen. Trace free fluid in the right paracolic gutter. Musculoskeletal: Degenerative changes throughout the thoracolumbar spine, mild to moderate in degree, with prominent degenerative facet arthropathies in the lower lumbar spine contributing to neural foramen narrowings at the L4-5 and L5-S1 levels bilaterally. IMPRESSION: 1. Small foci of free intraperitoneal air within the right lower abdomen, indicating bowel perforation. The source of the free intraperitoneal air is unclear, most likely related to perforated diverticulum within the adjacent right colon given its location and the trace free fluid in the right paracolic gutter, but no convincing diverticulitis is identified. Of note, the adjacent small bowel in the right abdomen appears normal. Another possible source for the free air would be the stomach as there is suggestion of gastric wall thickening at the level of the antrum and perigastric  inflammation. The free air, however, is a fairly large distance from the stomach which makes the stomach a somewhat less likely source. 2. Diffuse colonic diverticulosis, most prominent within the sigmoid colon. 3. 2 mm nonobstructing left ureteral stone. Associated hydronephrosis. 4. Nodular consolidations within the right upper lobe and right lower lobe. 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 These are less likely acute pneumonia or neoplastic process. Non-contrast chest CT at 3-6 months is recommended. If the nodules are stable at time of repeat CT, then future CT at 18-24 months (from today's scan) is considered optional for low-risk patients, but is recommended for high-risk patients. This recommendation follows the consensus statement: Guidelines for Management of Incidental Pulmonary Nodules Detected on CT Images: From the Fleischner Society 2017; Radiology 2017; 284:228-243. 5. **An incidental finding of potential clinical significance has been found. Left adrenal mass measuring 1.9 cm, with Hounsfield unit measurement not compatible with benign lipid rich adrenal adenoma based on this noncontrast exam. Per consensus guidelines, this is a probably benign finding for which a follow-up adrenal protocol CT is recommended in 12 months.** 6. Aortic atherosclerosis. 7. 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 If any associated radiculopathic symptoms, would consider nonemergent lumbar spine MRI to exclude associated nerve root impingement. At minimum, recommend close clinical follow-up and short-term follow-up CT to ensure stability or resolution of the acute/significant findings detailed above. Critical Value/emergent results were called by telephone at the time of interpretation on 12/25/2016 at 7:11 pm to Dr. Nira Conn RAI ,  who verbally acknowledged these results. Electronically Signed   By: Franki Cabot M.D.   On: 12/25/2016 19:31   Ct Head Wo Contrast  Result Date: 12/24/2016 CLINICAL DATA:  Confusion, sepsis, slurred speech EXAM: CT HEAD WITHOUT CONTRAST TECHNIQUE: Contiguous axial images were obtained from the base of the skull through the vertex without intravenous contrast. COMPARISON:  12/06/2016 FINDINGS: Brain: Stable brain atrophy pattern with chronic white matter microvascular ischemic changes throughout the cerebral hemispheres. Remote bilateral lacunar-type basilar ganglia infarctions and right cerebellar infarct. No acute intracranial hemorrhage, mass lesion, new infarction, focal mass effect or edema. Negative for herniation, midline shift, hydrocephalus, or extra-axial fluid collection. Cisterns are patent. Vascular: Atherosclerosis of the intracranial vessels. No hyperdense vessel demonstrated. Skull: Normal. Negative for fracture or focal lesion. Sinuses/Orbits: No acute finding. Other: None. IMPRESSION: Stable brain atrophy, chronic white matter microvascular ischemic changes and remote infarcts as above. No interval change or acute process by noncontrast CT. Electronically Signed   By: Jerilynn Mages.  Shick M.D.   On: 12/24/2016 07:53   Ct Chest Wo Contrast  Result Date: 12/25/2016 CLINICAL DATA:  Patient has N/V/D for about 1 week with fever and possible sepsis. Per patient he does not have any complaints excepted he is real weak, looking for source of infection" EXAM: CT CHEST, ABDOMEN AND PELVIS WITHOUT CONTRAST TECHNIQUE: Multidetector CT imaging of the chest, abdomen and pelvis was performed following the standard protocol without IV contrast. COMPARISON:  None. FINDINGS: CT CHEST FINDINGS Cardiovascular: Scattered atherosclerosis of the normal caliber thoracic aorta. Heart size is normal. No pericardial effusion. Coronary artery calcifications noted. Mediastinum/Nodes: No mass or enlarged lymph nodes within the  mediastinum or perihilar regions. Esophagus is unremarkable. Trachea and central bronchi are unremarkable. Lungs/Pleura: 1 cm nodular consolidation in the right lung apex (series 5, image 25). 1.4 cm nodular consolidation at the right lung apex, most likely nodular scarring/atelectasis as it is contiguous with adjacent linear scarring/atelectasis (image 114). Left lung is clear. No pleural effusion or pneumothorax. Musculoskeletal: Mild degenerative spurring within the thoracic spine. No acute or suspicious osseous finding. Superficial soft tissues are unremarkable. CT ABDOMEN PELVIS FINDINGS Hepatobiliary: No focal liver abnormality is seen. No gallstones, gallbladder wall thickening, or biliary dilatation. Pancreas: Unremarkable. No pancreatic ductal dilatation or surrounding inflammatory changes. Spleen: Normal in size without focal abnormality. Adrenals/Urinary Tract: Left adrenal mass measuring 1.9 cm, Hounsfield unit measurement of 21. Right adrenal gland appears normal. Kidneys appear normal without mass, stone or hydronephrosis. 2 mm stone within the proximal left ureter. Bladder is decompressed. Stomach/Bowel: Bowel is normal in caliber. Extensive diverticulosis of the sigmoid colon. Moderate degree of diverticulosis throughout the remainder of the colon. Vascular/Lymphatic: Aortic atherosclerosis. No enlarged lymph nodes seen. Reproductive: Status post prostatectomy. Other: Small foci of free intraperitoneal air within the lower right abdomen. Trace free fluid in the right paracolic gutter. Musculoskeletal: Degenerative changes throughout the thoracolumbar spine, mild to moderate in degree, with prominent degenerative facet arthropathies in the lower lumbar spine contributing to neural foramen narrowings at the L4-5 and L5-S1 levels bilaterally. IMPRESSION: 1. Small foci of free intraperitoneal air within the right lower abdomen, indicating bowel perforation. The source of the free intraperitoneal air is  unclear, most likely related to perforated diverticulum within the adjacent right colon given its location and the trace free fluid in the right paracolic gutter, but no convincing diverticulitis is identified. Of note, the adjacent small bowel in the right abdomen appears normal. Another possible source for the free air would be  the stomach as there is suggestion of gastric wall thickening at the level of the antrum and perigastric inflammation. The free air, however, is a fairly large distance from the stomach which makes the stomach a somewhat less likely source. 2. Diffuse colonic diverticulosis, most prominent within the sigmoid colon. 3. 2 mm nonobstructing left ureteral stone. Associated hydronephrosis. 4. Nodular consolidations within the right upper lobe and right lower lobe. 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 These are less likely acute pneumonia or neoplastic process. Non-contrast chest CT at 3-6 months is recommended. If the nodules are stable at time of repeat CT, then future CT at 18-24 months (from today's scan) is considered optional for low-risk patients, but is recommended for high-risk patients. This recommendation follows the consensus statement: Guidelines for Management of Incidental Pulmonary Nodules Detected on CT Images: From the Fleischner Society 2017; Radiology 2017; 284:228-243. 5. **An incidental finding of potential clinical significance has been found. Left adrenal mass measuring 1.9 cm, with Hounsfield unit measurement not compatible with benign lipid rich adrenal adenoma based on this noncontrast exam. Per consensus guidelines, this is a probably benign finding for which a follow-up adrenal protocol CT is recommended in 12 months.** 6. Aortic atherosclerosis. 7. Degenerative changes of the lower lumbar spine, as detailed above, with osseous neural foramen narrowings at L4-5 and L5-S1 which are mostly related to prominent degenerative facet  hypertrophy. If any associated radiculopathic symptoms, would consider nonemergent lumbar spine MRI to exclude associated nerve root impingement. At minimum, recommend close clinical follow-up and short-term follow-up CT to ensure stability or resolution of the acute/significant findings detailed above. Critical Value/emergent results were called by telephone at the time of interpretation on 12/25/2016 at 7:11 pm to Dr. Nira Conn RAI , who verbally acknowledged these results. Electronically Signed   By: Franki Cabot M.D.   On: 12/25/2016 19:31   Ct Cervical Spine Wo Contrast  Result Date: 12/06/2016 CLINICAL DATA:  Cervical neck pain after fall down stair case. EXAM: CT CERVICAL SPINE WITHOUT CONTRAST TECHNIQUE: Multidetector CT imaging of the cervical spine was performed without intravenous contrast. Multiplanar CT image reconstructions were also generated. COMPARISON:  No dedicated cervical spine imaging. FINDINGS: Alignment: Normal. Skull base and vertebrae: No acute fracture. Skullbase and dens are intact. Vertebral body heights are preserved. Soft tissues and spinal canal: No prevertebral fluid or swelling. No visible canal hematoma. Disc levels: Disc space narrowing and endplate spurring is most prominent at C5-C6 with a posterior disc osteophyte complex causing mild narrowing of the spinal canal. Scattered facet arthropathy. Upper chest: There is a 10 mm subpleural nodule at the right lung apex, previously 9 mm on neck CTA 09/01/2015. Other: Carotid calcifications. Encephalomalacia in the right cerebellum better characterized on brain MRI earlier this day. IMPRESSION: 1. Degenerative change in the cervical spine without acute fracture or subluxation. 2. A pulmonary nodule of the right lung apex measures 10 mm, increased by 1 mm compared to neck CTA 09/01/2015. Consider PET-CT characterization. Electronically Signed   By: Jeb Levering M.D.   On: 12/06/2016 23:16   Mr Virgel Paling WF Contrast  Result Date:  12/06/2016 CLINICAL DATA:  New onset left-sided weakness at 3 p.m. today. Abnormal speech. The examination had to be discontinued prior to completion due to need to stabilize the patient. EXAM: MRI HEAD WITHOUT CONTRAST MRA HEAD WITHOUT CONTRAST TECHNIQUE: Multiplanar, multiecho pulse sequences of the brain and surrounding structures were obtained without intravenous contrast. Angiographic images of the head were obtained  using MRA technique without contrast. COMPARISON:  CT head without contrast from the same day. FINDINGS: MRI HEAD FINDINGS Brain: The diffusion-weighted images demonstrate no acute or subacute infarction. 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 Moderate confluent periventricular and scattered subcortical T2 hyperintensities are again noted bilaterally. White matter changes extend into the brainstem, similar to the prior study. No acute hemorrhage or mass lesion is present. The ventricles are proportionate to the degree of atrophy. No significant extra-axial fluid collection is present. Vascular: Flow is present in the major intracranial arteries. Skull and upper cervical spine: The skullbase is unremarkable. Cervical spine is not imaged without sagittal T1 weighted imaging. Sinuses/Orbits: The paranasal sinuses and the mastoid air cells are clear. The globes and orbits are unremarkable. MRA HEAD FINDINGS The internal carotid artery is are within normal limits from the high cervical segments through the ICA termini bilaterally. The A1 and M1 segments are normal. Anterior communicating artery is patent. There is some attenuation of distal MCA branch vessels bilaterally. There is focal signal loss in the proximal anterior right M2 segment. This may reflect a focal stenosis versus artifact. More distal branches are intact, suggesting artifact. The left vertebral artery is the dominant vessel. Only the very distal right vertebral artery is seen. The more proximal  vessel may be occluded or hypoplastic. The basilar artery is within normal limits. Both posterior cerebral arteries originate from basilar tip. There is asymmetric attenuation of distal PCA branch vessels on the left. IMPRESSION: 1. No acute intracranial abnormality. 2. Confluent periventricular and scattered subcortical T2 hyperintensities bilaterally are moderately advanced for age. 3. Question narrowing of the anterior right M2 division. 4. Mild distal small vessel disease without other significant proximal emergent large vessel occlusion. Electronically Signed   By: San Morelle M.D.   On: 12/06/2016 21:07   Mr Brain Wo Contrast  Result Date: 12/06/2016 CLINICAL DATA:  New onset left-sided weakness at 3 p.m. today. Abnormal speech. The examination had to be discontinued prior to completion due to need to stabilize the patient. EXAM: MRI HEAD WITHOUT CONTRAST MRA HEAD WITHOUT CONTRAST TECHNIQUE: Multiplanar, multiecho pulse sequences of the brain and surrounding structures were obtained without intravenous contrast. Angiographic images of the head were obtained using MRA technique without contrast. COMPARISON:  CT head without contrast from the same day. FINDINGS: MRI HEAD FINDINGS Brain: The diffusion-weighted images demonstrate no acute or subacute infarction. 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 Moderate confluent periventricular and scattered subcortical T2 hyperintensities are again noted bilaterally. White matter changes extend into the brainstem, similar to the prior study. No acute hemorrhage or mass lesion is present. The ventricles are proportionate to the degree of atrophy. No significant extra-axial fluid collection is present. Vascular: Flow is present in the major intracranial arteries. Skull and upper cervical spine: The skullbase is unremarkable. Cervical spine is not imaged without sagittal T1 weighted imaging. Sinuses/Orbits: The paranasal  sinuses and the mastoid air cells are clear. The globes and orbits are unremarkable. MRA HEAD FINDINGS The internal carotid artery is are within normal limits from the high cervical segments through the ICA termini bilaterally. The A1 and M1 segments are normal. Anterior communicating artery is patent. There is some attenuation of distal MCA branch vessels bilaterally. There is focal signal loss in the proximal anterior right M2 segment. This may reflect a focal stenosis versus artifact. More distal branches are intact, suggesting artifact. The left vertebral artery is the dominant  vessel. Only the very distal right vertebral artery is seen. The more proximal vessel may be occluded or hypoplastic. The basilar artery is within normal limits. Both posterior cerebral arteries originate from basilar tip. There is asymmetric attenuation of distal PCA branch vessels on the left. IMPRESSION: 1. No acute intracranial abnormality. 2. Confluent periventricular and scattered subcortical T2 hyperintensities bilaterally are moderately advanced for age. 3. Question narrowing of the anterior right M2 division. 4. Mild distal small vessel disease without other significant proximal emergent large vessel occlusion. Electronically Signed   By: San Morelle M.D.   On: 12/06/2016 21:07   Mr Cervical Spine Wo Contrast  Result Date: 12/07/2016 CLINICAL DATA:  Initial evaluation for left-sided weakness, ataxia. EXAM: MRI CERVICAL, THORACIC SPINE WITHOUT CONTRAST TECHNIQUE: Multiplanar and multiecho pulse sequences of the cervical spine, to include the craniocervical junction and cervicothoracic junction, and thoracic spine, were obtained without intravenous contrast. COMPARISON:  None. FINDINGS: MRI CERVICAL SPINE FINDINGS Alignment: Straightening of the normal cervical lordosis. No listhesis. Vertebrae: Vertebral body heights are maintained. No evidence for acute or chronic fracture. Signal intensity within the vertebral body  bone marrow is normal. No discrete or worrisome osseous lesions. No abnormal marrow edema. Cord: Signal intensity within the cervical spinal cord is normal. Posterior Fossa, vertebral arteries, paraspinal tissues: Encephalomalacia within the inferior right cerebellar hemisphere compatible with remote infarct. Probable chronic microvascular ischemic changes noted within the pons. Craniocervical junction normal. Paraspinous and prevertebral soft tissues within normal limits. Normal intravascular flow void present within the left vertebral artery. Right vertebral artery poorly visualized, and may be hypoplastic and/ or occluded. Disc levels: C2-C3: Mild diffuse disc bulge with bilateral uncovertebral hypertrophy, greater on the right. Resultant mild right C3 foraminal stenosis. No significant canal or left foraminal narrowing. C3-C4: Diffuse degenerative disc bulge with bilateral uncovertebral spurring. Mild bilateral facet hypertrophy. Resultant moderate bilateral C4 foraminal stenosis, slightly worse on the right. Posterior disc osteophyte flattens the ventral thecal sac without significant canal stenosis. C4-C5: Chronic diffuse degenerative disc osteophyte with intervertebral disc space narrowing. Mild bilateral facet arthrosis, greater on the left. Broad posterior disc osteophyte flattens and partially faces the ventral thecal sac. Minimal flattening of the right hemi cord without cord signal changes. Resultant mild to moderate canal stenosis. Fairly severe bilateral C5 foraminal narrowing, left greater than right. C5-C6: Chronic diffuse degenerative disc osteophyte with intervertebral disc space narrowing. Bilateral facet degeneration with ligamentum flavum thickening. Broad posterior disc osteophyte flattens and largely effaces the ventral thecal sac. Slight inferior migration of disc material noted (series 3, image 7). Resultant moderate canal stenosis. Mild cord flattening on the left without cord signal  changes. Severe bilateral C6 foraminal narrowing, left worse than right. C6-C7: Chronic diffuse degenerative disc osteophyte with intervertebral disc space narrowing. Left-sided facet degeneration. Ligamentum flavum thickening. Left E centric disc osteophyte complex flattens and effaces the ventral thecal sac. There is flattening of the left hemi cord without cord signal changes. Resultant severe canal stenosis. Thecal sac measures 5 mm in AP diameter (series 7, image 23). Resultant fairly severe bilateral C7 foraminal stenosis, left worse than right. C7-T1: Minimal disc bulge. Mild facet hypertrophy. No significant stenosis. MRI THORACIC SPINE FINDINGS Alignment: Vertebral bodies normally aligned with preservation of the normal thoracic kyphosis. No listhesis. Vertebrae: Vertebral body heights are well maintained. No evidence for acute or chronic fracture. Signal intensity within the vertebral body bone marrow within normal limits. No discrete or worrisome osseous lesions. No abnormal marrow edema. Cord: Signal intensity within the  thoracic spinal cord is normal. Conus medullaris terminates at the T12-L1 level. Paraspinal and other soft tissues: Paraspinous soft tissues within normal limits. Partially visualized lungs are clear. Visualized visceral structures unremarkable. Disc levels: T1-2:  Left-sided facet degeneration.  No stenosis. T2-3: Right greater than left facet hypertrophy. Small left foraminal disc protrusion (series 11, image 10). No significant stenosis. T3-4:  Right greater than left facet degeneration.  No stenosis. T4-5:  Unremarkable. T5-6: Small central/ left central disc protrusion indents the ventral thecal sac. Posterior element hypertrophy. Resultant mild canal stenosis. T6-7: Right greater than left posterior element hypertrophy. No significant canal or foraminal stenosis. T7-8: Minimal disc bulge with posterior element hypertrophy. Resultant mild canal stenosis. No significant foraminal  encroachment. T8-9: Diffuse disc bulge with superimposed right foraminal disc protrusion (Series 16, image 7). Posterior element hypertrophy. Mild flattening of the right hemi cord without cord signal changes. Mild canal stenosis. Mild bilateral foraminal narrowing. T9-10: Diffuse degenerative disc bulge. Prominent bilateral facet arthrosis. Ventral and posterior CSF is largely effaced with resultant moderate canal stenosis (series 16, image 11). No significant foraminal encroachment. T10-11: Small right central disc protrusion indents the ventral thecal sac. Posterior element hypertrophy. Mild canal and left foraminal stenosis. T11-12:  Mild facet hypertrophy.  No stenosis. T12-L1:  Unremarkable. IMPRESSION: MRI CERVICAL SPINE IMPRESSION: 1. Advanced multilevel degenerative spondylolysis as detailed above with resultant multilevel canal narrowing, most prevalent at the C5-6 and C6-7 levels where stenosis is moderate to severe in nature. No cord signal changes identified. 2. Multifactorial degenerative changes with advanced multilevel foraminal narrowing as above. Notable findings include moderate bilateral C4 foraminal stenosis, with severe bilateral C5, C6, and C7 foraminal narrowing, overall worse on the left. 3. 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 MRI THORACIC SPINE IMPRESSION: 1. Moderate multilevel degenerative facet degeneration within the thoracic spine as above. Changes most pronounced at the T9-10 level were there is resultant moderate canal stenosis. Please see above report for a full description these findings. 2. No other acute abnormality within the thoracic spine. Electronically Signed   By: Jeannine Boga M.D.   On: 12/07/2016 03:52   Mr Thoracic Spine Wo Contrast  Result Date: 12/07/2016 CLINICAL DATA:  Initial evaluation for left-sided weakness, ataxia. EXAM: MRI CERVICAL, THORACIC SPINE WITHOUT CONTRAST TECHNIQUE: Multiplanar and  multiecho pulse sequences of the cervical spine, to include the craniocervical junction and cervicothoracic junction, and thoracic spine, were obtained without intravenous contrast. COMPARISON:  None. FINDINGS: MRI CERVICAL SPINE FINDINGS Alignment: Straightening of the normal cervical lordosis. No listhesis. Vertebrae: Vertebral body heights are maintained. No evidence for acute or chronic fracture. Signal intensity within the vertebral body bone marrow is normal. No discrete or worrisome osseous lesions. No abnormal marrow edema. Cord: Signal intensity within the cervical spinal cord is normal. Posterior Fossa, vertebral arteries, paraspinal tissues: Encephalomalacia within the inferior right cerebellar hemisphere compatible with remote infarct. Probable chronic microvascular ischemic changes noted within the pons. Craniocervical junction normal. Paraspinous and prevertebral soft tissues within normal limits. Normal intravascular flow void present within the left vertebral artery. Right vertebral artery poorly visualized, and may be hypoplastic and/ or occluded. Disc levels: C2-C3: Mild diffuse disc bulge with bilateral uncovertebral hypertrophy, greater on the right. Resultant mild right C3 foraminal stenosis. No significant canal or left foraminal narrowing. C3-C4: Diffuse degenerative disc bulge with bilateral uncovertebral spurring. Mild bilateral facet hypertrophy. Resultant moderate bilateral C4 foraminal stenosis, slightly worse on the right. Posterior disc osteophyte flattens the ventral thecal  sac without significant canal stenosis. C4-C5: Chronic diffuse degenerative disc osteophyte with intervertebral disc space narrowing. Mild bilateral facet arthrosis, greater on the left. Broad posterior disc osteophyte flattens and partially faces the ventral thecal sac. Minimal flattening of the right hemi cord without cord signal changes. Resultant mild to moderate canal stenosis. Fairly severe bilateral C5  foraminal narrowing, left greater than right. C5-C6: Chronic diffuse degenerative disc osteophyte with intervertebral disc space narrowing. Bilateral facet degeneration with ligamentum flavum thickening. Broad posterior disc osteophyte flattens and largely effaces the ventral thecal sac. Slight inferior migration of disc material noted (series 3, image 7). Resultant moderate canal stenosis. Mild cord flattening on the left without cord signal changes. Severe bilateral C6 foraminal narrowing, left worse than right. C6-C7: Chronic diffuse degenerative disc osteophyte with intervertebral disc space narrowing. Left-sided facet degeneration. Ligamentum flavum thickening. Left E centric disc osteophyte complex flattens and effaces the ventral thecal sac. There is flattening of the left hemi cord without cord signal changes. Resultant severe canal stenosis. Thecal sac measures 5 mm in AP diameter (series 7, image 23). Resultant fairly severe bilateral C7 foraminal stenosis, left worse than right. C7-T1: Minimal disc bulge. Mild facet hypertrophy. No significant stenosis. MRI THORACIC SPINE FINDINGS Alignment: Vertebral bodies normally aligned with preservation of the normal thoracic kyphosis. No listhesis. Vertebrae: Vertebral body heights are well maintained. No evidence for acute or chronic fracture. Signal intensity within the vertebral body bone marrow within normal limits. No discrete or worrisome osseous lesions. No abnormal marrow edema. Cord: Signal intensity within the thoracic spinal cord is normal. Conus medullaris terminates at the T12-L1 level. Paraspinal and other soft tissues: Paraspinous soft tissues within normal limits. Partially visualized lungs are clear. Visualized visceral structures unremarkable. Disc levels: T1-2:  Left-sided facet degeneration.  No stenosis. T2-3: Right greater than left facet hypertrophy. Small left foraminal disc protrusion (series 11, image 10). No significant stenosis. T3-4:   Right greater than left facet degeneration.  No stenosis. T4-5:  Unremarkable. T5-6: Small central/ left central disc protrusion indents the ventral thecal sac. Posterior element hypertrophy. Resultant mild canal stenosis. T6-7: Right greater than left posterior element hypertrophy. No significant canal or foraminal stenosis. T7-8: Minimal disc bulge with posterior element hypertrophy. Resultant mild canal stenosis. No significant foraminal encroachment. T8-9: Diffuse disc bulge with superimposed right foraminal disc protrusion (Series 16, image 7). Posterior element hypertrophy. Mild flattening of the right hemi cord without cord signal changes. Mild canal stenosis. Mild bilateral foraminal narrowing. T9-10: Diffuse degenerative disc bulge. Prominent bilateral facet arthrosis. Ventral and posterior CSF is largely effaced with resultant moderate canal stenosis (series 16, image 11). No significant foraminal encroachment. T10-11: Small right central disc protrusion indents the ventral thecal sac. Posterior element hypertrophy. Mild canal and left foraminal stenosis. T11-12:  Mild facet hypertrophy.  No stenosis. T12-L1:  Unremarkable. IMPRESSION: MRI CERVICAL SPINE IMPRESSION: 1. Advanced multilevel degenerative spondylolysis as detailed above with resultant multilevel canal narrowing, most prevalent at the C5-6 and C6-7 levels where stenosis is moderate to severe in nature. No cord signal changes identified. 2. Multifactorial degenerative changes with advanced multilevel foraminal narrowing as above. Notable findings include moderate bilateral C4 foraminal stenosis, with severe bilateral C5, C6, and C7 foraminal narrowing, overall worse on the left. 3. 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 MRI THORACIC SPINE IMPRESSION: 1. Moderate multilevel degenerative facet degeneration within the thoracic spine as above. Changes most pronounced at the T9-10 level were there  is resultant moderate canal  stenosis. Please see above report for a full description these findings. 2. No other acute abnormality within the thoracic spine. Electronically Signed   By: Jeannine Boga M.D.   On: 12/07/2016 03:52   US Renal  Result Date: 12/24/2016 CLINICAL DATA:  Acute kidney injury. EXAM: RENAL / URINARY TRACT ULTRASOUND COMPLETE COMPARISON:  None. FINDINGS: Right Kidney: Length: 10.6 cm. Echogenicity within normal limits. No mass or hydronephrosis visualized. Left Kidney: Length: 10.4 cm. Echogenicity within normal limits. No mass or hydronephrosis visualized. Bladder: Appears normal for degree of bladder distention. IMPRESSION: Negative for hydronephrosis.  Normal exam. Electronically Signed   By: Inge Rise M.D.   On: 12/24/2016 12:05   Dg Chest Port 1 View  Result Date: 12/25/2016 CLINICAL DATA:  Sepsis. EXAM: PORTABLE CHEST 1 VIEW COMPARISON:  12/24/2016 and 10/29/2016 FINDINGS: Heart size and pulmonary vascularity are normal. Minimal atelectasis at both lung bases laterally. The lungs are otherwise clear. No bone abnormality. IMPRESSION: Minimal atelectasis at both lung bases. Electronically Signed   By: Lorriane Shire M.D.   On: 12/25/2016 10:19   Dg Chest Port 1 View  Result Date: 12/24/2016 CLINICAL DATA:  Confusion EXAM: PORTABLE CHEST 1 VIEW COMPARISON:  10/29/2016 FINDINGS: Normal heart size. Lungs are under aerated. Central and basilar airspace disease. Vascular congestion. No pneumothorax. IMPRESSION: There is vascular congestion with central basilar hazy airspace disease. Electronically Signed   By: Marybelle Killings M.D.   On: 12/24/2016 11:32   Dg Abd Portable 1v  Result Date: 12/25/2016 CLINICAL DATA:  71 year old male on with bowel perforation. EXAM: PORTABLE ABDOMEN - 1 VIEW COMPARISON:  Abdominal CT dated 12/25/2016 FINDINGS: Oral contrast from prior CT is noted within the distal small bowel and colon. There is no bowel dilatation or evidence of  obstruction. No large free air identified. Prostatectomy surgical clips noted within the pelvis. There is degenerative changes of the lumbar spine. No acute osseous pathology. IMPRESSION: 1. No bowel dilatation or evidence of obstruction. 2. No large free air. Electronically Signed   By: Anner Crete M.D.   On: 12/25/2016 19:46   Ct Head Code Stroke W/o Cm  Result Date: 12/06/2016 CLINICAL DATA:  Code stroke. New onset gait problems. Last seen well at 3 p.m. today. EXAM: CT HEAD WITHOUT CONTRAST TECHNIQUE: Contiguous axial images were obtained from the base of the skull through the vertex without intravenous contrast. COMPARISON:  CT head without contrast 10/14/2015. MRI brain 08/30/2015. FINDINGS: Brain: Confluent periventricular white matter hypoattenuation bilaterally is similar the prior study. Basal ganglia are stable. No acute cortical infarct, hemorrhage, or mass lesion is present. Remote infarct of the right cerebellum and remote lacunar infarcts of the basal ganglia bilaterally are stable. Vascular: Atherosclerotic calcifications within the cavernous internal carotid artery's are stable. There is no hyperdense vessel. Calcifications are also present at the dural margin of the left vertebral artery. Skull: Calvarium is intact. No focal lytic or blastic lesions are present. Sinuses/Orbits: The paranasal sinuses and mastoid air cells are clear. ASPECTS [facility] Stroke Program Early CT Score) - Ganglionic level infarction (caudate, lentiform nuclei, internal capsule, insula, M1-M3 cortex): 7/7 - Supraganglionic infarction (M4-M6 cortex): 3/3 Total score (0-10 with 10 being normal): 10/10 IMPRESSION: 1. No acute intracranial abnormality or significant interval change. 2. Stable atrophy and white matter disease. 3. Stable remote lacunar infarcts involving the basal ganglia and periventricular white matter. 4. Remote infarct of the right inferior cerebellum is stable. 5. ASPECTS is 10/10 These results were  text paged at the  time of interpretation on 12/06/2016 at 8:08 pm to Dr. Leonel Ramsay. Electronically Signed   By: San Morelle M.D.   On: 12/06/2016 20:09    Lab Data:  CBC:  Recent Labs Lab 12/24/16 0515 12/24/16 0516 12/24/16 0813 12/25/16 0351 12/26/16 0301  WBC 14.3*  --  15.3* 8.6 10.2  NEUTROABS 12.8*  --   --   --   --   HGB 12.1* 12.9* 11.9* 10.2* 10.1*  HCT 38.3* 38.0* 37.0* 31.9* 31.1*  MCV 93.6  --  93.0 92.5 92.3  PLT 275  --  284 243 347   Basic Metabolic Panel:  Recent Labs Lab 12/24/16 0515 12/24/16 0516 12/24/16 0813 12/25/16 0351 12/26/16 0301  NA 141 141  --  141 141  K 3.9 3.9  --  3.6 3.6  CL 103 101  --  110 109  CO2 23  --   --  24 26  GLUCOSE 115* 109*  --  98 99  BUN 37* 38*  --  27* 18  CREATININE 4.72* 4.70* 4.16* 2.15* 1.42*  CALCIUM 8.4*  --   --  8.3* 8.5*  MG  --   --  1.8  --   --   PHOS  --   --  5.3*  --   --    GFR: Estimated Creatinine Clearance: 46.9 mL/min (A) (by C-G formula based on SCr of 1.42 mg/dL (H)). Liver Function Tests:  Recent Labs Lab 12/24/16 0515  AST 20  ALT 16*  ALKPHOS 99  BILITOT 0.2*  PROT 6.4*  ALBUMIN 4.0   No results for input(s): LIPASE, AMYLASE in the last 168 hours. No results for input(s): AMMONIA in the last 168 hours. Coagulation Profile:  Recent Labs Lab 12/24/16 0515  INR 1.05   Cardiac Enzymes: No results for input(s): CKTOTAL, CKMB, CKMBINDEX, TROPONINI in the last 168 hours. BNP (last 3 results) No results for input(s): PROBNP in the last 8760 hours. HbA1C: No results for input(s): HGBA1C in the last 72 hours. CBG:  Recent Labs Lab 12/24/16 0907  GLUCAP 110*   Lipid Profile: No results for input(s): CHOL, HDL, LDLCALC, TRIG, CHOLHDL, LDLDIRECT in the last 72 hours. Thyroid Function Tests: No results for input(s): TSH, T4TOTAL, FREET4, T3FREE, THYROIDAB in the last 72 hours. Anemia Panel: No results for input(s): VITAMINB12, FOLATE, FERRITIN, TIBC, IRON,  RETICCTPCT in the last 72 hours. Urine analysis:    Component Value Date/Time   COLORURINE YELLOW 12/24/2016 1418   APPEARANCEUR HAZY (A) 12/24/2016 1418   LABSPEC 1.014 12/24/2016 1418   PHURINE 5.0 12/24/2016 1418   GLUCOSEU NEGATIVE 12/24/2016 1418   HGBUR MODERATE (A) 12/24/2016 1418   BILIRUBINUR NEGATIVE 12/24/2016 1418   KETONESUR NEGATIVE 12/24/2016 1418   PROTEINUR 30 (A) 12/24/2016 1418   UROBILINOGEN 0.2 05/09/2014 2115   NITRITE NEGATIVE 12/24/2016 1418   LEUKOCYTESUR NEGATIVE 12/24/2016 1418     Ripudeep Rai M.D. [facility] 12/26/2016, 10:24 AM  Pager: [phone] Between 7am to 7pm - call Pager - [phone]  After 7pm go to [email or web address] - password [email or web address]  Call night coverage person covering after 7pm

## 2016-12-26 NOTE — Progress Notes (Signed)
Called to pt's room c/o abd pain, assessment of positive BS, soft, non-tender belly, Zofran IV given without relief, MD/Rai updated, new orders received, pt reassessed at 1650 stated GI symptoms resolved, nursing will cont to monitor

## 2016-12-26 NOTE — Progress Notes (Signed)
This RN entered the patient's room to administer his 2200 medications. Patient politely declined, saying, "it's not necessary". Patient says he will be leaving AMA in the morning to go to the [place]. This RN explained the importance of his medications and antibiotics, but still politely declined. The patient was willing to allow [place] to keep his IVs in and infusing, and keep his tele leads on. Schorr, NP notified. House supervisor, called and notified. The [facility] decided to keep him overnight since he is, "technically a patient". Will continue to monitor him over night, and report his AMA request to AM RN.

## 2016-12-26 NOTE — Progress Notes (Signed)
Subjective/Chief Complaint: Denies abd pain, n/v.    Objective: Vital signs in last 24 hours: Temp:  [97.7 F (36.5 C)-99.1 F (37.3 C)] 98.1 F (36.7 C) (06/24 0736) Pulse Rate:  [77-104] 80 (06/24 0800) Resp:  [13-23] 15 (06/24 0800) BP: (141-185)/(61-96) 141/61 (06/24 0800) SpO2:  [93 %-100 %] 93 % (06/24 0800) Weight:  [80.1 kg (176 lb 9.4 oz)] 80.1 kg (176 lb 9.4 oz) (06/24 0405) Last BM Date: 12/25/16  Intake/Output from previous day: 06/23 0701 - 06/24 0700 In: 2166.3 [I.V.:1666.3; IV Piggyback:500] Out: 925 [Urine:925] Intake/Output this shift: Total I/O In: -  Out: 125 [Urine:125]  Alert, nontoxic, resting comfortably Symmetric chest rise Soft, nd, nt; no guarding/rebound/peritonitis  Lab Results:   Recent Labs  12/25/16 0351 12/26/16 0301  WBC 8.6 10.2  HGB 10.2* 10.1*  HCT 31.9* 31.1*  PLT 243 240   BMET  Recent Labs  12/25/16 0351 12/26/16 0301  NA 141 141  K 3.6 3.6  CL 110 109  CO2 24 26  GLUCOSE 98 99  BUN 27* 18  CREATININE 2.15* 1.42*  CALCIUM 8.3* 8.5*   PT/INR  Recent Labs  12/24/16 0515  LABPROT 13.8  INR 1.05   ABG  Recent Labs  12/24/16 0520  HCO3 22.8    Studies/Results: Ct Abdomen Pelvis Wo Contrast  Result Date: 12/25/2016 CLINICAL DATA:  Patient has N/V/D for about 1 week with fever and possible sepsis. Per patient he does not have any complaints excepted he is real weak, looking for source of infection" EXAM: CT CHEST, ABDOMEN AND PELVIS WITHOUT CONTRAST TECHNIQUE: Multidetector CT imaging of the chest, abdomen and pelvis was performed following the standard protocol without IV contrast. COMPARISON:  None. FINDINGS: CT CHEST FINDINGS Cardiovascular: Scattered atherosclerosis of the normal caliber thoracic aorta. Heart size is normal. No pericardial effusion. Coronary artery calcifications noted. Mediastinum/Nodes: No mass or enlarged lymph nodes within the mediastinum or perihilar regions. Esophagus is  unremarkable. Trachea and central bronchi are unremarkable. Lungs/Pleura: 1 cm nodular consolidation in the right lung apex (series 5, image 25). 1.4 cm nodular consolidation at the right lung apex, most likely nodular scarring/atelectasis as it is contiguous with adjacent linear scarring/atelectasis (image 114). Left lung is clear. No pleural effusion or pneumothorax. Musculoskeletal: Mild degenerative spurring within the thoracic spine. No acute or suspicious osseous finding. Superficial soft tissues are unremarkable. CT ABDOMEN PELVIS FINDINGS Hepatobiliary: No focal liver abnormality is seen. No gallstones, gallbladder wall thickening, or biliary dilatation. Pancreas: Unremarkable. No pancreatic ductal dilatation or surrounding inflammatory changes. Spleen: Normal in size without focal abnormality. Adrenals/Urinary Tract: Left adrenal mass measuring 1.9 cm, Hounsfield unit measurement of 21. Right adrenal gland appears normal. Kidneys appear normal without mass, stone or hydronephrosis. 2 mm stone within the proximal left ureter. Bladder is decompressed. Stomach/Bowel: Bowel is normal in caliber. Extensive diverticulosis of the sigmoid colon. Moderate degree of diverticulosis throughout the remainder of the colon. Vascular/Lymphatic: Aortic atherosclerosis. No enlarged lymph nodes seen. Reproductive: Status post prostatectomy. Other: Small foci of free intraperitoneal air within the lower right abdomen. Trace free fluid in the right paracolic gutter. Musculoskeletal: Degenerative changes throughout the thoracolumbar spine, mild to moderate in degree, with prominent degenerative facet arthropathies in the lower lumbar spine contributing to neural foramen narrowings at the L4-5 and L5-S1 levels bilaterally. IMPRESSION: 1. Small foci of free intraperitoneal air within the right lower abdomen, indicating bowel perforation. The source of the free intraperitoneal air is unclear, most likely related to perforated  diverticulum  within the adjacent right colon given its location and the trace free fluid in the right paracolic gutter, but no convincing diverticulitis is identified. Of note, the adjacent small bowel in the right abdomen appears normal. Another possible source for the free air would be the stomach as there is suggestion of gastric wall thickening at the level of the antrum and perigastric inflammation. The free air, however, is a fairly large distance from the stomach which makes the stomach a somewhat less likely source. 2. Diffuse colonic diverticulosis, most prominent within the sigmoid colon. 3. 2 mm nonobstructing left ureteral stone. Associated hydronephrosis. 4. Nodular consolidations within the right upper lobe and right lower lobe. 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 These are less likely acute pneumonia or neoplastic process. Non-contrast chest CT at 3-6 months is recommended. If the nodules are stable at time of repeat CT, then future CT at 18-24 months (from today's scan) is considered optional for low-risk patients, but is recommended for high-risk patients. This recommendation follows the consensus statement: Guidelines for Management of Incidental Pulmonary Nodules Detected on CT Images: From the Fleischner Society 2017; Radiology 2017; 284:228-243. 5. **An incidental finding of potential clinical significance has been found. Left adrenal mass measuring 1.9 cm, with Hounsfield unit measurement not compatible with benign lipid rich adrenal adenoma based on this noncontrast exam. Per consensus guidelines, this is a probably benign finding for which a follow-up adrenal protocol CT is recommended in 12 months.** 6. Aortic atherosclerosis. 7. 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 If any associated radiculopathic  symptoms, would consider nonemergent lumbar spine MRI to exclude associated nerve root impingement. At minimum, recommend close clinical follow-up and short-term follow-up CT to ensure stability or resolution of the acute/significant findings detailed above. Critical Value/emergent results were called by telephone at the time of interpretation on 12/25/2016 at 7:11 pm to Dr. Nira Conn RAI , who verbally acknowledged these results. Electronically Signed   By: Franki Cabot M.D.   On: 12/25/2016 19:31   Ct Chest Wo Contrast  Result Date: 12/25/2016 CLINICAL DATA:  Patient has N/V/D for about 1 week with fever and possible sepsis. Per patient he does not have any complaints excepted he is real weak, looking for source of infection" EXAM: CT CHEST, ABDOMEN AND PELVIS WITHOUT CONTRAST TECHNIQUE: Multidetector CT imaging of the chest, abdomen and pelvis was performed following the standard protocol without IV contrast. COMPARISON:  None. FINDINGS: CT CHEST FINDINGS Cardiovascular: Scattered atherosclerosis of the normal caliber thoracic aorta. Heart size is normal. No pericardial effusion. Coronary artery calcifications noted. Mediastinum/Nodes: No mass or enlarged lymph nodes within the mediastinum or perihilar regions. Esophagus is unremarkable. Trachea and central bronchi are unremarkable. Lungs/Pleura: 1 cm nodular consolidation in the right lung apex (series 5, image 25). 1.4 cm nodular consolidation at the right lung apex, most likely nodular scarring/atelectasis as it is contiguous with adjacent linear scarring/atelectasis (image 114). Left lung is clear. No pleural effusion or pneumothorax. Musculoskeletal: Mild degenerative spurring within the thoracic spine. No acute or suspicious osseous finding. Superficial soft tissues are unremarkable. CT ABDOMEN PELVIS FINDINGS Hepatobiliary: No focal liver abnormality is seen. No gallstones, gallbladder wall thickening, or biliary dilatation. Pancreas: Unremarkable. No  pancreatic ductal dilatation or surrounding inflammatory changes. Spleen: Normal in size without focal abnormality. Adrenals/Urinary Tract: Left adrenal mass measuring 1.9 cm, Hounsfield unit measurement of 21.  Right adrenal gland appears normal. Kidneys appear normal without mass, stone or hydronephrosis. 2 mm stone within the proximal left ureter. Bladder is decompressed. Stomach/Bowel: Bowel is normal in caliber. Extensive diverticulosis of the sigmoid colon. Moderate degree of diverticulosis throughout the remainder of the colon. Vascular/Lymphatic: Aortic atherosclerosis. No enlarged lymph nodes seen. Reproductive: Status post prostatectomy. Other: Small foci of free intraperitoneal air within the lower right abdomen. Trace free fluid in the right paracolic gutter. Musculoskeletal: Degenerative changes throughout the thoracolumbar spine, mild to moderate in degree, with prominent degenerative facet arthropathies in the lower lumbar spine contributing to neural foramen narrowings at the L4-5 and L5-S1 levels bilaterally. IMPRESSION: 1. Small foci of free intraperitoneal air within the right lower abdomen, indicating bowel perforation. The source of the free intraperitoneal air is unclear, most likely related to perforated diverticulum within the adjacent right colon given its location and the trace free fluid in the right paracolic gutter, but no convincing diverticulitis is identified. Of note, the adjacent small bowel in the right abdomen appears normal. Another possible source for the free air would be the stomach as there is suggestion of gastric wall thickening at the level of the antrum and perigastric inflammation. The free air, however, is a fairly large distance from the stomach which makes the stomach a somewhat less likely source. 2. Diffuse colonic diverticulosis, most prominent within the sigmoid colon. 3. 2 mm nonobstructing left ureteral stone. Associated hydronephrosis. 4. Nodular consolidations  within the right upper lobe and right lower lobe. 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 These are less likely acute pneumonia or neoplastic process. Non-contrast chest CT at 3-6 months is recommended. If the nodules are stable at time of repeat CT, then future CT at 18-24 months (from today's scan) is considered optional for low-risk patients, but is recommended for high-risk patients. This recommendation follows the consensus statement: Guidelines for Management of Incidental Pulmonary Nodules Detected on CT Images: From the Fleischner Society 2017; Radiology 2017; 284:228-243. 5. **An incidental finding of potential clinical significance has been found. Left adrenal mass measuring 1.9 cm, with Hounsfield unit measurement not compatible with benign lipid rich adrenal adenoma based on this noncontrast exam. Per consensus guidelines, this is a probably benign finding for which a follow-up adrenal protocol CT is recommended in 12 months.** 6. Aortic atherosclerosis. 7. 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 If any associated radiculopathic symptoms, would consider nonemergent lumbar spine MRI to exclude associated nerve root impingement. At minimum, recommend close clinical follow-up and short-term follow-up CT to ensure stability or resolution of the acute/significant findings detailed above. Critical Value/emergent results were called by telephone at the time of interpretation on 12/25/2016 at 7:11 pm to Dr. Nira Conn RAI , who verbally acknowledged these results. Electronically Signed   By: Franki Cabot M.D.   On: 12/25/2016 19:31   US Renal  Result Date: 12/24/2016 CLINICAL DATA:  Acute kidney injury. EXAM: RENAL / URINARY TRACT ULTRASOUND COMPLETE COMPARISON:  None. FINDINGS: Right Kidney: Length: 10.6 cm. Echogenicity  within normal limits. No mass or hydronephrosis visualized. Left Kidney: Length: 10.4 cm. Echogenicity within normal limits. No mass or hydronephrosis visualized. Bladder: Appears normal for degree of bladder distention. IMPRESSION: Negative for hydronephrosis.  Normal exam. Electronically Signed   By: Inge Rise M.D.   On: 12/24/2016 12:05   Dg Chest [facility] [address]  Result Date: 12/25/2016 CLINICAL DATA:  Sepsis. EXAM: PORTABLE CHEST 1 VIEW COMPARISON:  12/24/2016 and 10/29/2016 FINDINGS: Heart size and pulmonary vascularity are normal. Minimal atelectasis at both lung bases laterally. The lungs are otherwise clear. No bone abnormality. IMPRESSION: Minimal atelectasis at both lung bases. Electronically Signed   By: Lorriane Shire M.D.   On: 12/25/2016 10:19   Dg Abd Portable 1v  Result Date: 12/25/2016 CLINICAL DATA:  71 year old male on with bowel perforation. EXAM: PORTABLE ABDOMEN - 1 VIEW COMPARISON:  Abdominal CT dated 12/25/2016 FINDINGS: Oral contrast from prior CT is noted within the distal small bowel and colon. There is no bowel dilatation or evidence of obstruction. No large free air identified. Prostatectomy surgical clips noted within the pelvis. There is degenerative changes of the lumbar spine. No acute osseous pathology. IMPRESSION: 1. No bowel dilatation or evidence of obstruction. 2. No large free air. Electronically Signed   By: Anner Crete M.D.   On: 12/25/2016 19:46    Anti-infectives: Anti-infectives    Start     Dose/Rate Route Frequency Ordered Stop   12/26/16 0600  vancomycin (VANCOCIN) IVPB 1000 mg/200 mL premix  Status:  Discontinued     1,000 mg 200 mL/hr over 60 Minutes Intravenous Every 48 hours 12/24/16 0537 12/25/16 0950   12/25/16 2030  metroNIDAZOLE (FLAGYL) IVPB 500 mg     500 mg 100 mL/hr over 60 Minutes Intravenous Every 8 hours 12/25/16 1924     12/25/16 2000  cefTRIAXone (ROCEPHIN) 1 g in dextrose 5 % 50 mL IVPB     1 g 100 mL/hr over 30 Minutes  Intravenous Every 24 hours 12/25/16 1930     12/25/16 1030  vancomycin (VANCOCIN) 1,250 mg in sodium chloride 0.9 % 250 mL IVPB     1,250 mg 166.7 mL/hr over 90 Minutes Intravenous Every 24 hours 12/25/16 0950     12/24/16 0600  ceFEPIme (MAXIPIME) 1 g in dextrose 5 % 50 mL IVPB  Status:  Discontinued     1 g 100 mL/hr over 30 Minutes Intravenous Every 24 hours 12/24/16 0525 12/25/16 1923   12/24/16 0530  vancomycin (VANCOCIN) IVPB 1000 mg/200 mL premix     1,000 mg 200 mL/hr over 60 Minutes Intravenous  Once 12/24/16 0525 12/24/16 0658      Assessment/Plan: Sepsis - improving/resolved - no fever, nml lactate, wbc now normal AKI - improving RLQ free air - perf rt colon diverticulum vs perf gastric ulcer; states he takes Aleve about 4-5 days per week.   He is still doing well. Nontender. Labs ok. It is unclear if he perf a rt colon diverticulum or gastric ulcer, nonetheless he is stable. Would cont npo xcept chips/water today.   If still looks well in AM, should be able to start clear liquids.   rec pt avoid NSAIDs outpt EGD/colonoscopy  Leighton Ruff. Redmond Pulling, MD, FACS General, Bariatric, & Minimally Invasive Surgery [facility], [place]   LOS: 2 days    Gayland Curry 12/26/2016

## 2016-12-26 NOTE — Progress Notes (Signed)
[facility] Progress Note    Assessment/ Plan:   1.  Acute on chronic kidney injury: Resolved.  likely hemodynamically mediated in setting of sepsis.  Making urine.  Baseline appears to be in the low 2s.  Never has seen a nephrologist.  Renal US with smaller side kidneys.  UA with some protein and a little blood in setting of Foley placement.  Don't think a big workup is necessary at this point.  He is getting markedly better with appropriate fluid resuscitation.  We will sign off, he can see me in clinic as an outpatient in 4-6 weeks after discharge.  2.  Chills/sweats/sepsis: CT abd shows some intraperitoneal air, possibly perfed ulcer or perfed diverticulum; no signs of peritonitis this AM.  Per surgery, recommending EGD/ c-scope  3.  HTN: very labile BP s according to his wife.  Will get renal dopplers (not to be done until Monday but if pt d/c'd before then can get as outpatient), ARR to start.  This can also be done as an outpatient.  4.  S/p CVA.  With residual deficits.  5.  Pulm nodule: noncontrast CT in 3-6 months recommended.    Subjective:    CT abd/pelvis performed last night which showed a small focus of free intraperitoneal air.  NPO, surgery consulted.  He is feeling well and is puzzled why he is NPO.   Objective:   BP (!) 141/61 (BP Location: Left Arm)   Pulse 80   Temp 98.1 F (36.7 C) (Oral)   Resp 15   Ht 5\' 4"  (1.626 m)   Wt 80.1 kg (176 lb 9.4 oz)   SpO2 93%   BMI 30.31 kg/m   Intake/Output Summary (Last 24 hours) at 12/26/16 0825 Last data filed at 12/26/16 0800  Gross per 24 hour  Intake          2166.25 ml  Output             1050 ml  Net          1116.25 ml   Weight change: -1.3 kg (-2 lb 13.9 oz)  Physical Exam: GEN older gentleman, NAD HEENT MMM NECK bilateral carotid bruits CV RRR soft systolic murmur PULM clear bilaterally ABD nondistended, nontender EXT no LE edema NEURO AAO x3 today Imaging: Ct Abdomen Pelvis Wo  Contrast  Result Date: 12/25/2016 CLINICAL DATA:  Patient has N/V/D for about 1 week with fever and possible sepsis. Per patient he does not have any complaints excepted he is real weak, looking for source of infection" EXAM: CT CHEST, ABDOMEN AND PELVIS WITHOUT CONTRAST TECHNIQUE: Multidetector CT imaging of the chest, abdomen and pelvis was performed following the standard protocol without IV contrast. COMPARISON:  None. FINDINGS: CT CHEST FINDINGS Cardiovascular: Scattered atherosclerosis of the normal caliber thoracic aorta. Heart size is normal. No pericardial effusion. Coronary artery calcifications noted. Mediastinum/Nodes: No mass or enlarged lymph nodes within the mediastinum or perihilar regions. Esophagus is unremarkable. Trachea and central bronchi are unremarkable. Lungs/Pleura: 1 cm nodular consolidation in the right lung apex (series 5, image 25). 1.4 cm nodular consolidation at the right lung apex, most likely nodular scarring/atelectasis as it is contiguous with adjacent linear scarring/atelectasis (image 114). Left lung is clear. No pleural effusion or pneumothorax. Musculoskeletal: Mild degenerative spurring within the thoracic spine. No acute or suspicious osseous finding. Superficial soft tissues are unremarkable. CT ABDOMEN PELVIS FINDINGS Hepatobiliary: No focal liver abnormality is seen. No gallstones, gallbladder wall thickening, or biliary dilatation. Pancreas:  Unremarkable. No pancreatic ductal dilatation or surrounding inflammatory changes. Spleen: Normal in size without focal abnormality. Adrenals/Urinary Tract: Left adrenal mass measuring 1.9 cm, Hounsfield unit measurement of 21. Right adrenal gland appears normal. Kidneys appear normal without mass, stone or hydronephrosis. 2 mm stone within the proximal left ureter. Bladder is decompressed. Stomach/Bowel: Bowel is normal in caliber. Extensive diverticulosis of the sigmoid colon. Moderate degree of diverticulosis throughout the  remainder of the colon. Vascular/Lymphatic: Aortic atherosclerosis. No enlarged lymph nodes seen. Reproductive: Status post prostatectomy. Other: Small foci of free intraperitoneal air within the lower right abdomen. Trace free fluid in the right paracolic gutter. Musculoskeletal: Degenerative changes throughout the thoracolumbar spine, mild to moderate in degree, with prominent degenerative facet arthropathies in the lower lumbar spine contributing to neural foramen narrowings at the L4-5 and L5-S1 levels bilaterally. IMPRESSION: 1. Small foci of free intraperitoneal air within the right lower abdomen, indicating bowel perforation. The source of the free intraperitoneal air is unclear, most likely related to perforated diverticulum within the adjacent right colon given its location and the trace free fluid in the right paracolic gutter, but no convincing diverticulitis is identified. Of note, the adjacent small bowel in the right abdomen appears normal. Another possible source for the free air would be the stomach as there is suggestion of gastric wall thickening at the level of the antrum and perigastric inflammation. The free air, however, is a fairly large distance from the stomach which makes the stomach a somewhat less likely source. 2. Diffuse colonic diverticulosis, most prominent within the sigmoid colon. 3. 2 mm nonobstructing left ureteral stone. Associated hydronephrosis. 4. Nodular consolidations within the right upper lobe and right lower lobe. 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 These are less likely acute pneumonia or neoplastic process. Non-contrast chest CT at 3-6 months is recommended. If the nodules are stable at time of repeat CT, then future CT at 18-24 months (from today's scan) is considered optional for low-risk patients, but is recommended for high-risk patients. This recommendation follows the consensus statement: Guidelines for Management of  Incidental Pulmonary Nodules Detected on CT Images: From the Fleischner Society 2017; Radiology 2017; 284:228-243. 5. **An incidental finding of potential clinical significance has been found. Left adrenal mass measuring 1.9 cm, with Hounsfield unit measurement not compatible with benign lipid rich adrenal adenoma based on this noncontrast exam. Per consensus guidelines, this is a probably benign finding for which a follow-up adrenal protocol CT is recommended in 12 months.** 6. Aortic atherosclerosis. 7. 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 If any associated radiculopathic symptoms, would consider nonemergent lumbar spine MRI to exclude associated nerve root impingement. At minimum, recommend close clinical follow-up and short-term follow-up CT to ensure stability or resolution of the acute/significant findings detailed above. Critical Value/emergent results were called by telephone at the time of interpretation on 12/25/2016 at 7:11 pm to Dr. Nira Conn RAI , who verbally acknowledged these results. Electronically Signed   By: Franki Cabot M.D.   On: 12/25/2016 19:31   Ct Chest Wo Contrast  Result Date: 12/25/2016 CLINICAL DATA:  Patient has N/V/D for about 1 week with fever and possible sepsis. Per patient he does not have any complaints excepted he is real weak, looking for source of infection" EXAM: CT CHEST, ABDOMEN AND PELVIS WITHOUT CONTRAST TECHNIQUE: Multidetector CT imaging of the chest, abdomen and pelvis  was performed following the standard protocol without IV contrast. COMPARISON:  None. FINDINGS: CT CHEST FINDINGS Cardiovascular: Scattered atherosclerosis of the normal caliber thoracic aorta. Heart size is normal. No pericardial effusion. Coronary artery calcifications noted. Mediastinum/Nodes: No mass or enlarged lymph nodes within the  mediastinum or perihilar regions. Esophagus is unremarkable. Trachea and central bronchi are unremarkable. Lungs/Pleura: 1 cm nodular consolidation in the right lung apex (series 5, image 25). 1.4 cm nodular consolidation at the right lung apex, most likely nodular scarring/atelectasis as it is contiguous with adjacent linear scarring/atelectasis (image 114). Left lung is clear. No pleural effusion or pneumothorax. Musculoskeletal: Mild degenerative spurring within the thoracic spine. No acute or suspicious osseous finding. Superficial soft tissues are unremarkable. CT ABDOMEN PELVIS FINDINGS Hepatobiliary: No focal liver abnormality is seen. No gallstones, gallbladder wall thickening, or biliary dilatation. Pancreas: Unremarkable. No pancreatic ductal dilatation or surrounding inflammatory changes. Spleen: Normal in size without focal abnormality. Adrenals/Urinary Tract: Left adrenal mass measuring 1.9 cm, Hounsfield unit measurement of 21. Right adrenal gland appears normal. Kidneys appear normal without mass, stone or hydronephrosis. 2 mm stone within the proximal left ureter. Bladder is decompressed. Stomach/Bowel: Bowel is normal in caliber. Extensive diverticulosis of the sigmoid colon. Moderate degree of diverticulosis throughout the remainder of the colon. Vascular/Lymphatic: Aortic atherosclerosis. No enlarged lymph nodes seen. Reproductive: Status post prostatectomy. Other: Small foci of free intraperitoneal air within the lower right abdomen. Trace free fluid in the right paracolic gutter. Musculoskeletal: Degenerative changes throughout the thoracolumbar spine, mild to moderate in degree, with prominent degenerative facet arthropathies in the lower lumbar spine contributing to neural foramen narrowings at the L4-5 and L5-S1 levels bilaterally. IMPRESSION: 1. Small foci of free intraperitoneal air within the right lower abdomen, indicating bowel perforation. The source of the free intraperitoneal air is  unclear, most likely related to perforated diverticulum within the adjacent right colon given its location and the trace free fluid in the right paracolic gutter, but no convincing diverticulitis is identified. Of note, the adjacent small bowel in the right abdomen appears normal. Another possible source for the free air would be the stomach as there is suggestion of gastric wall thickening at the level of the antrum and perigastric inflammation. The free air, however, is a fairly large distance from the stomach which makes the stomach a somewhat less likely source. 2. Diffuse colonic diverticulosis, most prominent within the sigmoid colon. 3. 2 mm nonobstructing left ureteral stone. Associated hydronephrosis. 4. Nodular consolidations within the right upper lobe and right lower lobe. 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 These are less likely acute pneumonia or neoplastic process. Non-contrast chest CT at 3-6 months is recommended. If the nodules are stable at time of repeat CT, then future CT at 18-24 months (from today's scan) is considered optional for low-risk patients, but is recommended for high-risk patients. This recommendation follows the consensus statement: Guidelines for Management of Incidental Pulmonary Nodules Detected on CT Images: From the Fleischner Society 2017; Radiology 2017; 284:228-243. 5. **An incidental finding of potential clinical significance has been found. Left adrenal mass measuring 1.9 cm, with Hounsfield unit measurement not compatible with benign lipid rich adrenal adenoma based on this noncontrast exam. Per consensus guidelines, this is a probably benign finding for which a follow-up adrenal protocol CT is recommended in 12 months.** 6. Aortic atherosclerosis. 7. Degenerative changes of the lower lumbar spine, as detailed above, with osseous neural foramen narrowings at L4-5 and L5-S1 which are mostly related to prominent  degenerative facet  hypertrophy. If any associated radiculopathic symptoms, would consider nonemergent lumbar spine MRI to exclude associated nerve root impingement. At minimum, recommend close clinical follow-up and short-term follow-up CT to ensure stability or resolution of the acute/significant findings detailed above. Critical Value/emergent results were called by telephone at the time of interpretation on 12/25/2016 at 7:11 pm to Dr. Nira Conn RAI , who verbally acknowledged these results. Electronically Signed   By: Franki Cabot M.D.   On: 12/25/2016 19:31   US Renal  Result Date: 12/24/2016 CLINICAL DATA:  Acute kidney injury. EXAM: RENAL / URINARY TRACT ULTRASOUND COMPLETE COMPARISON:  None. FINDINGS: Right Kidney: Length: 10.6 cm. Echogenicity within normal limits. No mass or hydronephrosis visualized. Left Kidney: Length: 10.4 cm. Echogenicity within normal limits. No mass or hydronephrosis visualized. Bladder: Appears normal for degree of bladder distention. IMPRESSION: Negative for hydronephrosis.  Normal exam. Electronically Signed   By: Inge Rise M.D.   On: 12/24/2016 12:05   Dg Chest Port 1 View  Result Date: 12/25/2016 CLINICAL DATA:  Sepsis. EXAM: PORTABLE CHEST 1 VIEW COMPARISON:  12/24/2016 and 10/29/2016 FINDINGS: Heart size and pulmonary vascularity are normal. Minimal atelectasis at both lung bases laterally. The lungs are otherwise clear. No bone abnormality. IMPRESSION: Minimal atelectasis at both lung bases. Electronically Signed   By: Lorriane Shire M.D.   On: 12/25/2016 10:19   Dg Abd Portable 1v  Result Date: 12/25/2016 CLINICAL DATA:  71 year old male on with bowel perforation. EXAM: PORTABLE ABDOMEN - 1 VIEW COMPARISON:  Abdominal CT dated 12/25/2016 FINDINGS: Oral contrast from prior CT is noted within the distal small bowel and colon. There is no bowel dilatation or evidence of obstruction. No large free air identified. Prostatectomy surgical clips noted within the pelvis. There is  degenerative changes of the lumbar spine. No acute osseous pathology. IMPRESSION: 1. No bowel dilatation or evidence of obstruction. 2. No large free air. Electronically Signed   By: Anner Crete M.D.   On: 12/25/2016 19:46    Labs: BMET  Recent Labs Lab 12/24/16 0515 12/24/16 0516 12/24/16 0813 12/25/16 0351 12/26/16 0301  NA 141 141  --  141 141  K 3.9 3.9  --  3.6 3.6  CL 103 101  --  110 109  CO2 23  --   --  24 26  GLUCOSE 115* 109*  --  98 99  BUN 37* 38*  --  27* 18  CREATININE 4.72* 4.70* 4.16* 2.15* 1.42*  CALCIUM 8.4*  --   --  8.3* 8.5*  PHOS  --   --  5.3*  --   --    CBC  Recent Labs Lab 12/24/16 0515 12/24/16 0516 12/24/16 0813 12/25/16 0351 12/26/16 0301  WBC 14.3*  --  15.3* 8.6 10.2  NEUTROABS 12.8*  --   --   --   --   HGB 12.1* 12.9* 11.9* 10.2* 10.1*  HCT 38.3* 38.0* 37.0* 31.9* 31.1*  MCV 93.6  --  93.0 92.5 92.3  PLT 275  --  284 243 240    Medications:    . atorvastatin  40 mg Oral QHS  . feeding supplement (ENSURE ENLIVE)  237 mL Oral TID BM  . FLUoxetine  60 mg Oral Daily  . heparin  5,000 Units Subcutaneous Q8H  . lamoTRIgine  100 mg Oral QHS  . mirtazapine  45 mg Oral QHS  . pantoprazole  80 mg Oral Daily  . prazosin  3 mg Oral QHS  . sodium  chloride flush  3 mL Intravenous Q12H      Madelon Lips MD [facility] pgr [phone] 12/26/2016, 8:25 AM

## 2016-12-27 ENCOUNTER — Inpatient Hospital Stay (HOSPITAL_COMMUNITY): Payer: Non-veteran care

## 2016-12-27 DIAGNOSIS — I160 Hypertensive urgency: Secondary | ICD-10-CM

## 2016-12-27 DIAGNOSIS — R531 Weakness: Secondary | ICD-10-CM

## 2016-12-27 LAB — BASIC METABOLIC PANEL
Anion gap: 7 (ref 5–15)
BUN: 13 mg/dL (ref 6–20)
CALCIUM: 8.5 mg/dL — AB (ref 8.9–10.3)
CO2: 26 mmol/L (ref 22–32)
CREATININE: 1.22 mg/dL (ref 0.61–1.24)
Chloride: 108 mmol/L (ref 101–111)
GFR calc non Af Amer: 59 mL/min — ABNORMAL LOW (ref >60)
Glucose, Bld: 90 mg/dL (ref 65–99)
Potassium: 3.7 mmol/L (ref 3.5–5.1)
SODIUM: 141 mmol/L (ref 135–145)

## 2016-12-27 LAB — CBC
HEMATOCRIT: 32.1 % — AB (ref 39.0–52.0)
Hemoglobin: 10.2 g/dL — ABNORMAL LOW (ref 13.0–17.0)
MCH: 29.7 pg (ref 26.0–34.0)
MCHC: 31.8 g/dL (ref 30.0–36.0)
MCV: 93.6 fL (ref 78.0–100.0)
PLATELETS: 230 10*3/uL (ref 150–400)
RBC: 3.43 MIL/uL — ABNORMAL LOW (ref 4.22–5.81)
RDW: 13.1 % (ref 11.5–15.5)
WBC: 8.6 10*3/uL (ref 4.0–10.5)

## 2016-12-27 LAB — PROCALCITONIN: Procalcitonin: <0.1 ng/mL

## 2016-12-27 MED ORDER — GI COCKTAIL ~~LOC~~: 30.0000 mL | Freq: Three times a day (TID) | ORAL | Status: DC | PRN

## 2016-12-27 NOTE — Progress Notes (Signed)
[facility]/Trauma Progress Note      Subjective:  CC: wants to eat  Pt states no abdominal pain or fevers. No acute events overnight. Nursing notes state one episode of abdominal pain yesterday but resolved.   Objective: Vital signs in last 24 hours: Temp:  [98 F (36.7 C)-98.6 F (37 C)] 98.4 F (36.9 C) (06/25 0759) Pulse Rate:  [70-87] 73 (06/25 0759) Resp:  [10-22] 22 (06/25 0759) BP: (158-186)/(70-83) 158/70 (06/25 0759) SpO2:  [95 %-98 %] 96 % (06/25 0759) Weight:  [181 lb 14.1 oz (82.5 kg)] 181 lb 14.1 oz (82.5 kg) (06/25 0415) Last BM Date: 12/26/16  Intake/Output from previous day: 06/24 0701 - 06/25 0700 In: 2570.4 [I.V.:2320.4; IV Piggyback:250] Out: 1125 [Urine:1125] Intake/Output this shift: Total I/O In: 81.7 [I.V.:81.7] Out: 300 [Urine:300]  PE: Gen:  Alert, NAD, pleasant, cooperative, well appearing Card:  RRR, no M/G/R heard Pulm:  Rate and effort normal Abd: Soft, suprapubic scar, not distended, +BS, no TTP, no hernias appreciated Skin: no rashes noted, warm and dry  Lab Results:   Recent Labs  12/26/16 0301 12/27/16 0505  WBC 10.2 8.6  HGB 10.1* 10.2*  HCT 31.1* 32.1*  PLT 240 230   BMET  Recent Labs  12/26/16 0301 12/27/16 0505  NA 141 141  K 3.6 3.7  CL 109 108  CO2 26 26  GLUCOSE 99 90  BUN 18 13  CREATININE 1.42* 1.22  CALCIUM 8.5* 8.5*   PT/INR No results for input(s): LABPROT, INR in the last 72 hours. CMP     Component Value Date/Time   NA 141 12/27/2016 0505   K 3.7 12/27/2016 0505   CL 108 12/27/2016 0505   CO2 26 12/27/2016 0505   GLUCOSE 90 12/27/2016 0505   BUN 13 12/27/2016 0505   CREATININE 1.22 12/27/2016 0505   CREATININE 1.03 05/23/2014 1239   CALCIUM 8.5 (L) 12/27/2016 0505   PROT 6.4 (L) 12/24/2016 0515   ALBUMIN 4.0 12/24/2016 0515   AST 20 12/24/2016 0515   ALT 16 (L) 12/24/2016 0515   ALKPHOS 99 12/24/2016 0515   BILITOT 0.2 (L) 12/24/2016 0515   GFRNONAA 59 (L) 12/27/2016 0505    GFRNONAA 75 05/23/2014 1239   GFRAA >60 12/27/2016 0505   GFRAA 86 05/23/2014 1239   Lipase     Component Value Date/Time   LIPASE 45 09/25/2015 1325    Studies/Results: Ct Abdomen Pelvis Wo Contrast  Result Date: 12/25/2016 CLINICAL DATA:  Patient has N/V/D for about 1 week with fever and possible sepsis. Per patient he does not have any complaints excepted he is real weak, looking for source of infection" EXAM: CT CHEST, ABDOMEN AND PELVIS WITHOUT CONTRAST TECHNIQUE: Multidetector CT imaging of the chest, abdomen and pelvis was performed following the standard protocol without IV contrast. COMPARISON:  None. FINDINGS: CT CHEST FINDINGS Cardiovascular: Scattered atherosclerosis of the normal caliber thoracic aorta. Heart size is normal. No pericardial effusion. Coronary artery calcifications noted. Mediastinum/Nodes: No mass or enlarged lymph nodes within the mediastinum or perihilar regions. Esophagus is unremarkable. Trachea and central bronchi are unremarkable. Lungs/Pleura: 1 cm nodular consolidation in the right lung apex (series 5, image 25). 1.4 cm nodular consolidation at the right lung apex, most likely nodular scarring/atelectasis as it is contiguous with adjacent linear scarring/atelectasis (image 114). Left lung is clear. No pleural effusion or pneumothorax. Musculoskeletal: Mild degenerative spurring within the thoracic spine. No acute or suspicious osseous finding. Superficial soft tissues are unremarkable. CT ABDOMEN PELVIS FINDINGS  Hepatobiliary: No focal liver abnormality is seen. No gallstones, gallbladder wall thickening, or biliary dilatation. Pancreas: Unremarkable. No pancreatic ductal dilatation or surrounding inflammatory changes. Spleen: Normal in size without focal abnormality. Adrenals/Urinary Tract: Left adrenal mass measuring 1.9 cm, Hounsfield unit measurement of 21. Right adrenal gland appears normal. Kidneys appear normal without mass, stone or hydronephrosis. 2 mm  stone within the proximal left ureter. Bladder is decompressed. Stomach/Bowel: Bowel is normal in caliber. Extensive diverticulosis of the sigmoid colon. Moderate degree of diverticulosis throughout the remainder of the colon. Vascular/Lymphatic: Aortic atherosclerosis. No enlarged lymph nodes seen. Reproductive: Status post prostatectomy. Other: Small foci of free intraperitoneal air within the lower right abdomen. Trace free fluid in the right paracolic gutter. Musculoskeletal: Degenerative changes throughout the thoracolumbar spine, mild to moderate in degree, with prominent degenerative facet arthropathies in the lower lumbar spine contributing to neural foramen narrowings at the L4-5 and L5-S1 levels bilaterally. IMPRESSION: 1. Small foci of free intraperitoneal air within the right lower abdomen, indicating bowel perforation. The source of the free intraperitoneal air is unclear, most likely related to perforated diverticulum within the adjacent right colon given its location and the trace free fluid in the right paracolic gutter, but no convincing diverticulitis is identified. Of note, the adjacent small bowel in the right abdomen appears normal. Another possible source for the free air would be the stomach as there is suggestion of gastric wall thickening at the level of the antrum and perigastric inflammation. The free air, however, is a fairly large distance from the stomach which makes the stomach a somewhat less likely source. 2. Diffuse colonic diverticulosis, most prominent within the sigmoid colon. 3. 2 mm nonobstructing left ureteral stone. Associated hydronephrosis. 4. Nodular consolidations within the right upper lobe and right lower lobe. 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 These are less likely acute pneumonia or neoplastic process. Non-contrast chest CT at 3-6 months is recommended. If the nodules are stable at time of repeat CT, then future CT at 18-24  months (from today's scan) is considered optional for low-risk patients, but is recommended for high-risk patients. This recommendation follows the consensus statement: Guidelines for Management of Incidental Pulmonary Nodules Detected on CT Images: From the Fleischner Society 2017; Radiology 2017; 284:228-243. 5. **An incidental finding of potential clinical significance has been found. Left adrenal mass measuring 1.9 cm, with Hounsfield unit measurement not compatible with benign lipid rich adrenal adenoma based on this noncontrast exam. Per consensus guidelines, this is a probably benign finding for which a follow-up adrenal protocol CT is recommended in 12 months.** 6. Aortic atherosclerosis. 7. 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 If any associated radiculopathic symptoms, would consider nonemergent lumbar spine MRI to exclude associated nerve root impingement. At minimum, recommend close clinical follow-up and short-term follow-up CT to ensure stability or resolution of the acute/significant findings detailed above. Critical Value/emergent results were called by telephone at the time of interpretation on 12/25/2016 at 7:11 pm to Dr. Nira Conn RAI , who verbally acknowledged these results. Electronically Signed   By: Franki Cabot M.D.   On: 12/25/2016 19:31   Ct Chest Wo Contrast  Result Date: 12/25/2016 CLINICAL DATA:  Patient has N/V/D for about 1 week with fever and possible sepsis. Per patient he does not have any complaints excepted he is real weak, looking for source of infection" EXAM: CT  CHEST, ABDOMEN AND PELVIS WITHOUT CONTRAST TECHNIQUE: Multidetector CT imaging of the chest, abdomen and pelvis was performed following the standard protocol without IV contrast. COMPARISON:  None. FINDINGS: CT CHEST FINDINGS Cardiovascular: Scattered  atherosclerosis of the normal caliber thoracic aorta. Heart size is normal. No pericardial effusion. Coronary artery calcifications noted. Mediastinum/Nodes: No mass or enlarged lymph nodes within the mediastinum or perihilar regions. Esophagus is unremarkable. Trachea and central bronchi are unremarkable. Lungs/Pleura: 1 cm nodular consolidation in the right lung apex (series 5, image 25). 1.4 cm nodular consolidation at the right lung apex, most likely nodular scarring/atelectasis as it is contiguous with adjacent linear scarring/atelectasis (image 114). Left lung is clear. No pleural effusion or pneumothorax. Musculoskeletal: Mild degenerative spurring within the thoracic spine. No acute or suspicious osseous finding. Superficial soft tissues are unremarkable. CT ABDOMEN PELVIS FINDINGS Hepatobiliary: No focal liver abnormality is seen. No gallstones, gallbladder wall thickening, or biliary dilatation. Pancreas: Unremarkable. No pancreatic ductal dilatation or surrounding inflammatory changes. Spleen: Normal in size without focal abnormality. Adrenals/Urinary Tract: Left adrenal mass measuring 1.9 cm, Hounsfield unit measurement of 21. Right adrenal gland appears normal. Kidneys appear normal without mass, stone or hydronephrosis. 2 mm stone within the proximal left ureter. Bladder is decompressed. Stomach/Bowel: Bowel is normal in caliber. Extensive diverticulosis of the sigmoid colon. Moderate degree of diverticulosis throughout the remainder of the colon. Vascular/Lymphatic: Aortic atherosclerosis. No enlarged lymph nodes seen. Reproductive: Status post prostatectomy. Other: Small foci of free intraperitoneal air within the lower right abdomen. Trace free fluid in the right paracolic gutter. Musculoskeletal: Degenerative changes throughout the thoracolumbar spine, mild to moderate in degree, with prominent degenerative facet arthropathies in the lower lumbar spine contributing to neural foramen narrowings at  the L4-5 and L5-S1 levels bilaterally. IMPRESSION: 1. Small foci of free intraperitoneal air within the right lower abdomen, indicating bowel perforation. The source of the free intraperitoneal air is unclear, most likely related to perforated diverticulum within the adjacent right colon given its location and the trace free fluid in the right paracolic gutter, but no convincing diverticulitis is identified. Of note, the adjacent small bowel in the right abdomen appears normal. Another possible source for the free air would be the stomach as there is suggestion of gastric wall thickening at the level of the antrum and perigastric inflammation. The free air, however, is a fairly large distance from the stomach which makes the stomach a somewhat less likely source. 2. Diffuse colonic diverticulosis, most prominent within the sigmoid colon. 3. 2 mm nonobstructing left ureteral stone. Associated hydronephrosis. 4. Nodular consolidations within the right upper lobe and right lower lobe. 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 These are less likely acute pneumonia or neoplastic process. Non-contrast chest CT at 3-6 months is recommended. If the nodules are stable at time of repeat CT, then future CT at 18-24 months (from today's scan) is considered optional for low-risk patients, but is recommended for high-risk patients. This recommendation follows the consensus statement: Guidelines for Management of Incidental Pulmonary Nodules Detected on CT Images: From the Fleischner Society 2017; Radiology 2017; 284:228-243. 5. **An incidental finding of potential clinical significance has been found. Left adrenal mass measuring 1.9 cm, with Hounsfield unit measurement not compatible with benign lipid rich adrenal adenoma based on this noncontrast exam. Per consensus guidelines, this is a probably benign finding for which a follow-up adrenal protocol CT is recommended in 12 months.** 6. Aortic  atherosclerosis. 7. Degenerative changes of the lower lumbar spine, as  detailed above, with osseous neural foramen narrowings at L4-5 and L5-S1 which are mostly related to prominent degenerative facet hypertrophy. If any associated radiculopathic symptoms, would consider nonemergent lumbar spine MRI to exclude associated nerve root impingement. At minimum, recommend close clinical follow-up and short-term follow-up CT to ensure stability or resolution of the acute/significant findings detailed above. Critical Value/emergent results were called by telephone at the time of interpretation on 12/25/2016 at 7:11 pm to Dr. Nira Conn RAI , who verbally acknowledged these results. Electronically Signed   By: Franki Cabot M.D.   On: 12/25/2016 19:31   Dg Abd Portable 1v  Result Date: 12/26/2016 CLINICAL DATA:  Painful acid reflux. EXAM: PORTABLE ABDOMEN - 1 VIEW COMPARISON:  CT scan from yesterday FINDINGS: The lung bases are normal. Evaluation for free air is limited on supine imaging but none is seen. Colonic diverticulosis is seen. No other abnormalities. IMPRESSION: No acute abnormalities identified. The free air seen on the CT scan from yesterday is not visualized but today's study is limited due to supine imaging. Electronically Signed   By: Dorise Bullion III M.D   On: 12/26/2016 16:12   Dg Abd Portable 1v  Result Date: 12/25/2016 CLINICAL DATA:  71 year old male on with bowel perforation. EXAM: PORTABLE ABDOMEN - 1 VIEW COMPARISON:  Abdominal CT dated 12/25/2016 FINDINGS: Oral contrast from prior CT is noted within the distal small bowel and colon. There is no bowel dilatation or evidence of obstruction. No large free air identified. Prostatectomy surgical clips noted within the pelvis. There is degenerative changes of the lumbar spine. No acute osseous pathology. IMPRESSION: 1. No bowel dilatation or evidence of obstruction. 2. No large free air. Electronically Signed   By: Anner Crete M.D.   On:  12/25/2016 19:46    Anti-infectives: Anti-infectives    Start     Dose/Rate Route Frequency Ordered Stop   12/26/16 0600  vancomycin (VANCOCIN) IVPB 1000 mg/200 mL premix  Status:  Discontinued     1,000 mg 200 mL/hr over 60 Minutes Intravenous Every 48 hours 12/24/16 0537 12/25/16 0950   12/25/16 2030  metroNIDAZOLE (FLAGYL) IVPB 500 mg     500 mg 100 mL/hr over 60 Minutes Intravenous Every 8 hours 12/25/16 1924     12/25/16 2000  cefTRIAXone (ROCEPHIN) 1 g in dextrose 5 % 50 mL IVPB     1 g 100 mL/hr over 30 Minutes Intravenous Every 24 hours 12/25/16 1930     12/25/16 1030  vancomycin (VANCOCIN) 1,250 mg in sodium chloride 0.9 % 250 mL IVPB  Status:  Discontinued     1,250 mg 166.7 mL/hr over 90 Minutes Intravenous Every 24 hours 12/25/16 0950 12/26/16 1152   12/24/16 0600  ceFEPIme (MAXIPIME) 1 g in dextrose 5 % 50 mL IVPB  Status:  Discontinued     1 g 100 mL/hr over 30 Minutes Intravenous Every 24 hours 12/24/16 0525 12/25/16 1923   12/24/16 0530  vancomycin (VANCOCIN) IVPB 1000 mg/200 mL premix     1,000 mg 200 mL/hr over 60 Minutes Intravenous  Once 12/24/16 0525 12/24/16 0658       Assessment/Plan Sepsis - improving/resolved - no fever, nml lactate, wbc now normal AKI - improving  RLQ free air - perf rt colon diverticulum vs perf gastric ulcer; states he takes Aleve about 4-5 days per week.  - Nontender. Labs unremarkable. It is unclear if he perf a rt colon diverticulum or gastric ulcer, nonetheless he is stable.   - start clear liquids  rec pt avoid NSAIDs outpt EGD/colonoscopy  We will continue to follow   LOS: 3 days    Kalman Drape , [facility] 12/27/2016, 8:37 AM Pager: [phone] Consults: [phone] Mon-Fri 7:00 am-4:30 pm Sat-Sun 7:00 am-11:30 am

## 2016-12-27 NOTE — Progress Notes (Signed)
Patient called RN into the room and apologized for his behavior earlier in the shift. He stated that he felt as if his bipolar history might have been a factor, and made him act irritable. Was willing to continue treatment plan, and not leave AMA in the morning. Will continue to monitor.

## 2016-12-27 NOTE — Evaluation (Signed)
Physical Therapy Evaluation Patient Details Name: Jason [name] MRN: [id number] DOB: 1945/07/14 Today's Date: 12/27/2016   History of Present Illness  Pt adm with sepsis Likely due to the perforated colon/diverticulum versus gastric ulcer. PMH - rt occipital infarct, chronic back pain, CAD, hypertension, CVA, bipolar disease  Clinical Impression  Pt admitted with above diagnosis and presents to PT with functional limitations due to deficits listed below (See PT problem list). Pt needs skilled PT to maximize independence and safety to allow discharge to home with family. Will follow acutely but don't feel he will need PT after DC. Pt scheduled for back surgery on July 13.     Follow Up Recommendations No PT follow up    Equipment Recommendations  None recommended by PT    Recommendations for Other Services       Precautions / Restrictions Precautions Precautions: Fall Restrictions Weight Bearing Restrictions: No      Mobility  Bed Mobility Overal bed mobility: Modified Independent             General bed mobility comments: Incr time  Transfers Overall transfer level: Needs assistance Equipment used: Rolling walker (2 wheeled) Transfers: Sit to/from Stand Sit to Stand: Supervision         General transfer comment: for safety  Ambulation/Gait Ambulation/Gait assistance: Min guard Ambulation Distance (Feet): 75 Feet Assistive device: Rolling walker (2 wheeled) Gait Pattern/deviations: Step-through pattern;Decreased stride length;Drifts right/left Gait velocity: decr Gait velocity interpretation: Below normal speed for age/gender General Gait Details: Slightly unsteady gait but no overt loss of balance with use of walker  Stairs            Wheelchair Mobility    Modified Rankin (Stroke Patients Only)       Balance Overall balance assessment: Needs assistance Sitting-balance support: No upper extremity supported;Feet supported Sitting  balance-[name]: Good     Standing balance support: Single extremity supported Standing balance-[name]: Poor Standing balance comment: UE support                             Pertinent Vitals/Pain Pain Assessment: Faces Faces Pain Scale: Hurts little more Pain Location: back (chronic back and bil leg pain) Pain Descriptors / Indicators: Numbness Pain Intervention(s): Limited activity within patient's tolerance    Home Living Family/patient expects to be discharged to:: Private residence Living Arrangements: Spouse/significant other;Other relatives (wife and sister) Available Help at Discharge: Family;Available 24 hours/day Type of Home: House Home Access: Level entry Entrance Stairs-Rails: None   Home Layout: Two level Home Equipment: Walker - 2 wheels;Cane - single point;Tub bench      Prior Function Level of Independence: Needs assistance   Gait / Transfers Assistance Needed: amb household distances modified independent with walker or cane. Uses chair lift on stairs           Hand Dominance   Dominant Hand: Right    Extremity/Trunk Assessment   Upper Extremity Assessment Upper Extremity Assessment: Overall WFL for tasks assessed    Lower Extremity Assessment Lower Extremity Assessment: Generalized weakness;RLE deficits/detail RLE Sensation: decreased light touch;decreased proprioception       Communication   Communication: No difficulties  Cognition Arousal/Alertness: Awake/alert Behavior During Therapy: WFL for tasks assessed/performed Overall Cognitive Status: Within Functional Limits for tasks assessed  General Comments      Exercises     Assessment/Plan    PT Assessment Patient needs continued PT services  PT Problem List Decreased strength;Decreased activity tolerance;Decreased balance;Decreased mobility;Impaired sensation       PT Treatment Interventions DME  instruction;Gait training;Functional mobility training;Therapeutic activities;Therapeutic exercise;Balance training;Patient/family education    PT Goals (Current goals can be found in the Care Plan section)  Acute Rehab PT Goals Patient Stated Goal: return home and have back surgery on July 13 PT Goal Formulation: With patient Time For Goal Achievement: 12/31/16 Potential to Achieve Goals: Good    Frequency Min 3X/week   Barriers to discharge        Co-evaluation               AM-PAC PT "6 Clicks" Daily Activity  Outcome Measure Difficulty turning over in bed (including adjusting bedclothes, sheets and blankets)?: A Little Difficulty moving from lying on back to sitting on the side of the bed? : A Little Difficulty sitting down on and standing up from a chair with arms (e.g., wheelchair, bedside commode, etc,.)?: A Little Help needed moving to and from a bed to chair (including a wheelchair)?: None Help needed walking in hospital room?: A Little Help needed climbing 3-5 steps with a railing? : A Little 6 Click Score: 19    End of Session   Activity Tolerance: Patient tolerated treatment well Patient left: in bed;with call bell/phone within reach;with bed alarm set Nurse Communication: Mobility status PT Visit Diagnosis: Unsteadiness on feet (R26.81);Muscle weakness (generalized) (M62.81)    Time: 1505-6979 PT Time Calculation (min) (ACUTE ONLY): 16 min   Charges:   PT Evaluation $PT Eval Low Complexity: 1 Procedure     PT G CodesMarland Kitchen        [facility] [place] 12/27/2016, 5:27 PM

## 2016-12-27 NOTE — Progress Notes (Signed)
Report given to RN on 5W that will be assuming care. Patient wife informed via phone of patient new room assignment.

## 2016-12-27 NOTE — Progress Notes (Signed)
Patient refused his Blood Pressure being checked for midnight vitals. The nursing staff was able to obtain a temperature, which was 98.6 orally. This RN explained the importance of checking his blood pressure, especially because he did not take his scheduled medications. The patient continued to politely refuse.

## 2016-12-27 NOTE — Progress Notes (Signed)
*  PRELIMINARY RESULTS* Vascular Ultrasound Renal Artery Duplex has been completed.  Findings suggest 1-59% right renal artery stenosis and upper range 1-59% left renal artery stenosis.  12/27/2016 10:54 AM Maudry Mayhew, BS, RVT, RDCS, RDMS

## 2016-12-27 NOTE — Progress Notes (Signed)
[facility]                                                                              Patient Demographics  Jason [name], is a 71 y.o. male, DOB - 1945/07/16, DGU:[id number]  Admit date - 12/24/2016   Admitting Physician Edwin Dada, MD  Outpatient Primary MD for the patient is System, Pcp Not In  Outpatient specialists:   LOS - 3  days   Medical records reviewed and are as summarized below:    Chief Complaint  Patient presents with  . Hypotension       Brief summary   Patient is a 71 year old male with CAD, hypertension, hyperlipidemia, CVA, bipolar disease, anemia, GERD presented with sudden weakness, feelings of hot and cold for several weeks, seen in ED on 6/4. Patient presented to ED with extreme weakness, diaphoretic, feverish. Temp 96.3 at the time of admission, hypothermic, BP 101/82, tachycardiac 107, O2 sats 98% on room air Patient was admitted for possible sepsis due to stepdown unit Patient also reported nausea, vomiting, diarrhea for last 1 week. No abdominal pain.  Assessment & Plan    Principal Problem:   Sepsis ([place]) : Likely due to the perforated colon/diverticulum versus gastric ulcer - Presented with nausea, vomiting and diarrhea for last 1 week, lactic acidosis  - Blood cultures NTD, UA negative chest x-ray with vascular congestion and central basilar hazy airspace disease - Sepsis physiology improved  Active problems Right lower quadrant free air due to perforated right colon diverticulum versus perforated gastric ulcer - Possibly the cause of #1, had nausea, vomiting and diarrhea for last 1 week PTA - Improved, he yesterday had abdominal pain and acid reflux - Changed to IV Protonix, added GI cocktail, KUB showed no worsening of free air - Feeling better today, general surgery has seen and added clear liquid diet   Acute kidney injury on chronic kidney disease stage III: Baseline creatinine 1.6-2 - Creatinine 4.7  at the time of admission, due to profound dehydration from nausea, vomiting, diarrhea in the last 1 week, sepsis - Renal ultrasound showed no hydronephrosis or obstruction - Creatinine has improved to 1.2, at baseline  Pulmonary nodule - CT cervical spine on 6/4 showed pulmonary nodule of right lung apex measuring 10 mm increased by 1 mL and compared to neck CT on 09/01/15 -  CT chest showed nodular consolidations in the right upper lobe and right lower lobe favored to represent chronic areas of nodular scarring/fibrosis, less likely to be acute pneumonia or neoplastic process. - Noncontrast chest chest CT recommended in 3-6 months.  Lactic acidosis, dehydration -  resolved with IV fluid hydration    Bipolar 1 disorder (HCC) - Continue Lamictal, Remeron, fluoxetine    HTN (hypertension) - Currently stable, continue prazosin  Prolonged QTC - Cardiology was consulted, there was a question of possible atrial flutter on the EKG however per cardiology, it is sinus tachycardia secondary to elevated lactic acid and sepsis, recommended follow electrolytes and correct as needed    GERD (gastroesophageal reflux disease) - Continue PPI  Severe deconditioning  - PTOT evaluation   Code Status: Full CODE  STATUS DVT Prophylaxis:  Heparin sq Family Communication: Discussed in detail with the patient, all imaging results, lab results explained to the patient   Disposition Plan: Transfer to floor  Time Spent in minutes   25 minutes  Procedures:  None   Consultants:   Nephrology General surgery   Antimicrobials :   IV vancomycin 6/22 > 6/23   cefepime 6/22> 6/23  IV Rocephin 6/23>>  IV Flagyl 6/23>>   Medications  Scheduled Meds: . atorvastatin  40 mg Oral QHS  . feeding supplement (ENSURE ENLIVE)  237 mL Oral TID BM  . FLUoxetine  60 mg Oral Daily  . heparin  5,000 Units Subcutaneous Q8H  . lamoTRIgine  100 mg Oral QHS  . mirtazapine  45 mg Oral QHS  . pantoprazole  (PROTONIX) IV  40 mg Intravenous Q12H  . prazosin  3 mg Oral QHS  . sodium chloride flush  3 mL Intravenous Q12H   Continuous Infusions: . sodium chloride Stopped (12/27/16 0924)  . cefTRIAXone (ROCEPHIN)  IV Stopped (12/26/16 2054)  . metronidazole Stopped (12/27/16 0511)   PRN Meds:.acetaminophen **OR** acetaminophen, gi cocktail, hydrALAZINE, HYDROmorphone (DILAUDID) injection, ondansetron **OR** ondansetron (ZOFRAN) IV, oxyCODONE, promethazine   Antibiotics   Anti-infectives    Start     Dose/Rate Route Frequency Ordered Stop   12/26/16 0600  vancomycin (VANCOCIN) IVPB 1000 mg/200 mL premix  Status:  Discontinued     1,000 mg 200 mL/hr over 60 Minutes Intravenous Every 48 hours 12/24/16 0537 12/25/16 0950   12/25/16 2030  metroNIDAZOLE (FLAGYL) IVPB 500 mg     500 mg 100 mL/hr over 60 Minutes Intravenous Every 8 hours 12/25/16 1924     12/25/16 2000  cefTRIAXone (ROCEPHIN) 1 g in dextrose 5 % 50 mL IVPB     1 g 100 mL/hr over 30 Minutes Intravenous Every 24 hours 12/25/16 1930     12/25/16 1030  vancomycin (VANCOCIN) 1,250 mg in sodium chloride 0.9 % 250 mL IVPB  Status:  Discontinued     1,250 mg 166.7 mL/hr over 90 Minutes Intravenous Every 24 hours 12/25/16 0950 12/26/16 1152   12/24/16 0600  ceFEPIme (MAXIPIME) 1 g in dextrose 5 % 50 mL IVPB  Status:  Discontinued     1 g 100 mL/hr over 30 Minutes Intravenous Every 24 hours 12/24/16 0525 12/25/16 1923   12/24/16 0530  vancomycin (VANCOCIN) IVPB 1000 mg/200 mL premix     1,000 mg 200 mL/hr over 60 Minutes Intravenous  Once 12/24/16 0525 12/24/16 7948        Subjective:   Jason [name] was seen and examined today. Feels better today, wants to eat food, wants to go home. No abdominal pain today, acid reflux is improving.  Patient denies dizziness, chest pain, shortness of breath, new weakness, numbess, tingling.   Objective:   Vitals:   12/27/16 0357 12/27/16 0415 12/27/16 0700 12/27/16 0759  BP: (!) 164/78  (!)  167/82 (!) 158/70  Pulse: 70  73 73  Resp: 10  17 (!) 22  Temp: 98.3 F (36.8 C)   98.4 F (36.9 C)  TempSrc: Oral   Oral  SpO2: 96%  96% 96%  Weight:  82.5 kg (181 lb 14.1 oz)    Height:        Intake/Output Summary (Last 24 hours) at 12/27/16 1032 Last data filed at 12/27/16 0923  Gross per 24 hour  Intake          2892.09 ml  Output  1300 ml  Net          1592.09 ml     Wt Readings from Last 3 Encounters:  12/27/16 82.5 kg (181 lb 14.1 oz)  12/06/16 79.4 kg (175 lb 0.7 oz)  10/29/16 83 kg (182 lb 15.7 oz)     Exam  General: Alert and oriented x 3, NAD  Eyes: EOMI, PERRLA  HEENT: atraumatic, normocephalic  Cardiovascular: S1 and S2 clear, RRR, trace pedal edema  Respiratory: Fairly clear to auscultation bilaterally  Gastrointestinal: Soft, nontender, nondistended, normal bowel sounds  Ext: trace pedal edema  Neuro: no focal neurological deficits   Musculoskeletal: No cyanosis, clubbing  Skin: No rashes  Psych: alert and oriented 3, normal affect   Data Reviewed:  I have personally reviewed following labs and imaging studies  Micro Results Recent Results (from the past 240 hour(s))  Blood Culture (routine x 2)     Status: None (Preliminary result)   Collection Time: 12/24/16  5:38 AM  Result Value Ref Range Status   Specimen Description BLOOD RIGHT ARM  Final   Special Requests   Final    BOTTLES DRAWN AEROBIC AND ANAEROBIC Blood Culture adequate volume   Culture NO GROWTH 2 DAYS  Final   Report Status PENDING  Incomplete  Blood Culture (routine x 2)     Status: None (Preliminary result)   Collection Time: 12/24/16  5:38 AM  Result Value Ref Range Status   Specimen Description BLOOD RIGHT HAND  Final   Special Requests IN PEDIATRIC BOTTLE Blood Culture adequate volume  Final   Culture NO GROWTH 2 DAYS  Final   Report Status PENDING  Incomplete  MRSA PCR Screening     Status: None   Collection Time: 12/24/16  2:20 PM  Result Value  Ref Range Status   MRSA by PCR NEGATIVE NEGATIVE Final    Comment:        The GeneXpert MRSA Assay (FDA approved for NASAL specimens only), is one component of a comprehensive MRSA colonization surveillance program. It is not intended to diagnose MRSA infection nor to guide or monitor treatment for MRSA infections.   Culture, blood (routine x 2)     Status: None (Preliminary result)   Collection Time: 12/25/16  8:10 PM  Result Value Ref Range Status   Specimen Description BLOOD RIGHT ARM  Final   Special Requests   Final    BOTTLES DRAWN AEROBIC AND ANAEROBIC Blood Culture adequate volume   Culture NO GROWTH < 24 HOURS  Final   Report Status PENDING  Incomplete  Culture, blood (routine x 2)     Status: None (Preliminary result)   Collection Time: 12/25/16  8:15 PM  Result Value Ref Range Status   Specimen Description BLOOD LEFT HAND  Final   Special Requests   Final    BOTTLES DRAWN AEROBIC ONLY Blood Culture results may not be optimal due to an inadequate volume of blood received in culture bottles   Culture NO GROWTH < 24 HOURS  Final   Report Status PENDING  Incomplete    Radiology Reports Ct Abdomen Pelvis Wo Contrast  Result Date: 12/25/2016 CLINICAL DATA:  Patient has N/V/D for about 1 week with fever and possible sepsis. Per patient he does not have any complaints excepted he is real weak, looking for source of infection" EXAM: CT CHEST, ABDOMEN AND PELVIS WITHOUT CONTRAST TECHNIQUE: Multidetector CT imaging of the chest, abdomen and pelvis was performed following the standard protocol without IV  contrast. COMPARISON:  None. FINDINGS: CT CHEST FINDINGS Cardiovascular: Scattered atherosclerosis of the normal caliber thoracic aorta. Heart size is normal. No pericardial effusion. Coronary artery calcifications noted. Mediastinum/Nodes: No mass or enlarged lymph nodes within the mediastinum or perihilar regions. Esophagus is unremarkable. Trachea and central bronchi are  unremarkable. Lungs/Pleura: 1 cm nodular consolidation in the right lung apex (series 5, image 25). 1.4 cm nodular consolidation at the right lung apex, most likely nodular scarring/atelectasis as it is contiguous with adjacent linear scarring/atelectasis (image 114). Left lung is clear. No pleural effusion or pneumothorax. Musculoskeletal: Mild degenerative spurring within the thoracic spine. No acute or suspicious osseous finding. Superficial soft tissues are unremarkable. CT ABDOMEN PELVIS FINDINGS Hepatobiliary: No focal liver abnormality is seen. No gallstones, gallbladder wall thickening, or biliary dilatation. Pancreas: Unremarkable. No pancreatic ductal dilatation or surrounding inflammatory changes. Spleen: Normal in size without focal abnormality. Adrenals/Urinary Tract: Left adrenal mass measuring 1.9 cm, Hounsfield unit measurement of 21. Right adrenal gland appears normal. Kidneys appear normal without mass, stone or hydronephrosis. 2 mm stone within the proximal left ureter. Bladder is decompressed. Stomach/Bowel: Bowel is normal in caliber. Extensive diverticulosis of the sigmoid colon. Moderate degree of diverticulosis throughout the remainder of the colon. Vascular/Lymphatic: Aortic atherosclerosis. No enlarged lymph nodes seen. Reproductive: Status post prostatectomy. Other: Small foci of free intraperitoneal air within the lower right abdomen. Trace free fluid in the right paracolic gutter. Musculoskeletal: Degenerative changes throughout the thoracolumbar spine, mild to moderate in degree, with prominent degenerative facet arthropathies in the lower lumbar spine contributing to neural foramen narrowings at the L4-5 and L5-S1 levels bilaterally. IMPRESSION: 1. Small foci of free intraperitoneal air within the right lower abdomen, indicating bowel perforation. The source of the free intraperitoneal air is unclear, most likely related to perforated diverticulum within the adjacent right colon given  its location and the trace free fluid in the right paracolic gutter, but no convincing diverticulitis is identified. Of note, the adjacent small bowel in the right abdomen appears normal. Another possible source for the free air would be the stomach as there is suggestion of gastric wall thickening at the level of the antrum and perigastric inflammation. The free air, however, is a fairly large distance from the stomach which makes the stomach a somewhat less likely source. 2. Diffuse colonic diverticulosis, most prominent within the sigmoid colon. 3. 2 mm nonobstructing left ureteral stone. Associated hydronephrosis. 4. Nodular consolidations within the right upper lobe and right lower lobe. 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 These are less likely acute pneumonia or neoplastic process. Non-contrast chest CT at 3-6 months is recommended. If the nodules are stable at time of repeat CT, then future CT at 18-24 months (from today's scan) is considered optional for low-risk patients, but is recommended for high-risk patients. This recommendation follows the consensus statement: Guidelines for Management of Incidental Pulmonary Nodules Detected on CT Images: From the Fleischner Society 2017; Radiology 2017; 284:228-243. 5. **An incidental finding of potential clinical significance has been found. Left adrenal mass measuring 1.9 cm, with Hounsfield unit measurement not compatible with benign lipid rich adrenal adenoma based on this noncontrast exam. Per consensus guidelines, this is a probably benign finding for which a follow-up adrenal protocol CT is recommended in 12 months.** 6. Aortic atherosclerosis. 7. 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 If any associated radiculopathic symptoms,  would consider nonemergent lumbar spine  MRI to exclude associated nerve root impingement. At minimum, recommend close clinical follow-up and short-term follow-up CT to ensure stability or resolution of the acute/significant findings detailed above. Critical Value/emergent results were called by telephone at the time of interpretation on 12/25/2016 at 7:11 pm to Dr. Nira Conn Janiyha Montufar , who verbally acknowledged these results. Electronically Signed   By: Franki Cabot M.D.   On: 12/25/2016 19:31   Ct Head Wo Contrast  Result Date: 12/24/2016 CLINICAL DATA:  Confusion, sepsis, slurred speech EXAM: CT HEAD WITHOUT CONTRAST TECHNIQUE: Contiguous axial images were obtained from the base of the skull through the vertex without intravenous contrast. COMPARISON:  12/06/2016 FINDINGS: Brain: Stable brain atrophy pattern with chronic white matter microvascular ischemic changes throughout the cerebral hemispheres. Remote bilateral lacunar-type basilar ganglia infarctions and right cerebellar infarct. No acute intracranial hemorrhage, mass lesion, new infarction, focal mass effect or edema. Negative for herniation, midline shift, hydrocephalus, or extra-axial fluid collection. Cisterns are patent. Vascular: Atherosclerosis of the intracranial vessels. No hyperdense vessel demonstrated. Skull: Normal. Negative for fracture or focal lesion. Sinuses/Orbits: No acute finding. Other: None. IMPRESSION: Stable brain atrophy, chronic white matter microvascular ischemic changes and remote infarcts as above. No interval change or acute process by noncontrast CT. Electronically Signed   By: Jerilynn Mages.  Shick M.D.   On: 12/24/2016 07:53   Ct Chest Wo Contrast  Result Date: 12/25/2016 CLINICAL DATA:  Patient has N/V/D for about 1 week with fever and possible sepsis. Per patient he does not have any complaints excepted he is real weak, looking for source of infection" EXAM: CT CHEST, ABDOMEN AND PELVIS WITHOUT CONTRAST TECHNIQUE: Multidetector CT imaging of the chest, abdomen and pelvis  was performed following the standard protocol without IV contrast. COMPARISON:  None. FINDINGS: CT CHEST FINDINGS Cardiovascular: Scattered atherosclerosis of the normal caliber thoracic aorta. Heart size is normal. No pericardial effusion. Coronary artery calcifications noted. Mediastinum/Nodes: No mass or enlarged lymph nodes within the mediastinum or perihilar regions. Esophagus is unremarkable. Trachea and central bronchi are unremarkable. Lungs/Pleura: 1 cm nodular consolidation in the right lung apex (series 5, image 25). 1.4 cm nodular consolidation at the right lung apex, most likely nodular scarring/atelectasis as it is contiguous with adjacent linear scarring/atelectasis (image 114). Left lung is clear. No pleural effusion or pneumothorax. Musculoskeletal: Mild degenerative spurring within the thoracic spine. No acute or suspicious osseous finding. Superficial soft tissues are unremarkable. CT ABDOMEN PELVIS FINDINGS Hepatobiliary: No focal liver abnormality is seen. No gallstones, gallbladder wall thickening, or biliary dilatation. Pancreas: Unremarkable. No pancreatic ductal dilatation or surrounding inflammatory changes. Spleen: Normal in size without focal abnormality. Adrenals/Urinary Tract: Left adrenal mass measuring 1.9 cm, Hounsfield unit measurement of 21. Right adrenal gland appears normal. Kidneys appear normal without mass, stone or hydronephrosis. 2 mm stone within the proximal left ureter. Bladder is decompressed. Stomach/Bowel: Bowel is normal in caliber. Extensive diverticulosis of the sigmoid colon. Moderate degree of diverticulosis throughout the remainder of the colon. Vascular/Lymphatic: Aortic atherosclerosis. No enlarged lymph nodes seen. Reproductive: Status post prostatectomy. Other: Small foci of free intraperitoneal air within the lower right abdomen. Trace free fluid in the right paracolic gutter. Musculoskeletal: Degenerative changes throughout the thoracolumbar spine, mild to  moderate in degree, with prominent degenerative facet arthropathies in the lower lumbar spine contributing to neural foramen narrowings at the L4-5 and L5-S1 levels bilaterally. IMPRESSION: 1. Small foci of free intraperitoneal air within the right lower abdomen, indicating bowel perforation. The source of the  free intraperitoneal air is unclear, most likely related to perforated diverticulum within the adjacent right colon given its location and the trace free fluid in the right paracolic gutter, but no convincing diverticulitis is identified. Of note, the adjacent small bowel in the right abdomen appears normal. Another possible source for the free air would be the stomach as there is suggestion of gastric wall thickening at the level of the antrum and perigastric inflammation. The free air, however, is a fairly large distance from the stomach which makes the stomach a somewhat less likely source. 2. Diffuse colonic diverticulosis, most prominent within the sigmoid colon. 3. 2 mm nonobstructing left ureteral stone. Associated hydronephrosis. 4. Nodular consolidations within the right upper lobe and right lower lobe. 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 These are less likely acute pneumonia or neoplastic process. Non-contrast chest CT at 3-6 months is recommended. If the nodules are stable at time of repeat CT, then future CT at 18-24 months (from today's scan) is considered optional for low-risk patients, but is recommended for high-risk patients. This recommendation follows the consensus statement: Guidelines for Management of Incidental Pulmonary Nodules Detected on CT Images: From the Fleischner Society 2017; Radiology 2017; 284:228-243. 5. **An incidental finding of potential clinical significance has been found. Left adrenal mass measuring 1.9 cm, with Hounsfield unit measurement not compatible with benign lipid rich adrenal adenoma based on this noncontrast exam. Per  consensus guidelines, this is a probably benign finding for which a follow-up adrenal protocol CT is recommended in 12 months.** 6. Aortic atherosclerosis. 7. 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 If any associated radiculopathic symptoms, would consider nonemergent lumbar spine MRI to exclude associated nerve root impingement. At minimum, recommend close clinical follow-up and short-term follow-up CT to ensure stability or resolution of the acute/significant findings detailed above. Critical Value/emergent results were called by telephone at the time of interpretation on 12/25/2016 at 7:11 pm to Dr. Nira Conn Wynn Alldredge , who verbally acknowledged these results. Electronically Signed   By: Franki Cabot M.D.   On: 12/25/2016 19:31   Ct Cervical Spine Wo Contrast  Result Date: 12/06/2016 CLINICAL DATA:  Cervical neck pain after fall down stair case. EXAM: CT CERVICAL SPINE WITHOUT CONTRAST TECHNIQUE: Multidetector CT imaging of the cervical spine was performed without intravenous contrast. Multiplanar CT image reconstructions were also generated. COMPARISON:  No dedicated cervical spine imaging. FINDINGS: Alignment: Normal. Skull base and vertebrae: No acute fracture. Skullbase and dens are intact. Vertebral body heights are preserved. Soft tissues and spinal canal: No prevertebral fluid or swelling. No visible canal hematoma. Disc levels: Disc space narrowing and endplate spurring is most prominent at C5-C6 with a posterior disc osteophyte complex causing mild narrowing of the spinal canal. Scattered facet arthropathy. Upper chest: There is a 10 mm subpleural nodule at the right lung apex, previously 9 mm on neck CTA 09/01/2015. Other: Carotid calcifications. Encephalomalacia in the right cerebellum better characterized on brain MRI earlier this day. IMPRESSION: 1.  Degenerative change in the cervical spine without acute fracture or subluxation. 2. A pulmonary nodule of the right lung apex measures 10 mm, increased by 1 mm compared to neck CTA 09/01/2015. Consider PET-CT characterization. Electronically Signed   By: Jeb Levering M.D.   On: 12/06/2016 23:16   Mr Jodene Nam Head Wo Contrast  Result Date: 12/06/2016 CLINICAL DATA:  New onset  left-sided weakness at 3 p.m. today. Abnormal speech. The examination had to be discontinued prior to completion due to need to stabilize the patient. EXAM: MRI HEAD WITHOUT CONTRAST MRA HEAD WITHOUT CONTRAST TECHNIQUE: Multiplanar, multiecho pulse sequences of the brain and surrounding structures were obtained without intravenous contrast. Angiographic images of the head were obtained using MRA technique without contrast. COMPARISON:  CT head without contrast from the same day. FINDINGS: MRI HEAD FINDINGS Brain: The diffusion-weighted images demonstrate no acute or subacute infarction. 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 Moderate confluent periventricular and scattered subcortical T2 hyperintensities are again noted bilaterally. White matter changes extend into the brainstem, similar to the prior study. No acute hemorrhage or mass lesion is present. The ventricles are proportionate to the degree of atrophy. No significant extra-axial fluid collection is present. Vascular: Flow is present in the major intracranial arteries. Skull and upper cervical spine: The skullbase is unremarkable. Cervical spine is not imaged without sagittal T1 weighted imaging. Sinuses/Orbits: The paranasal sinuses and the mastoid air cells are clear. The globes and orbits are unremarkable. MRA HEAD FINDINGS The internal carotid artery is are within normal limits from the high cervical segments through the ICA termini bilaterally. The A1 and M1 segments are normal. Anterior communicating artery is patent. There is some attenuation  of distal MCA branch vessels bilaterally. There is focal signal loss in the proximal anterior right M2 segment. This may reflect a focal stenosis versus artifact. More distal branches are intact, suggesting artifact. The left vertebral artery is the dominant vessel. Only the very distal right vertebral artery is seen. The more proximal vessel may be occluded or hypoplastic. The basilar artery is within normal limits. Both posterior cerebral arteries originate from basilar tip. There is asymmetric attenuation of distal PCA branch vessels on the left. IMPRESSION: 1. No acute intracranial abnormality. 2. Confluent periventricular and scattered subcortical T2 hyperintensities bilaterally are moderately advanced for age. 3. Question narrowing of the anterior right M2 division. 4. Mild distal small vessel disease without other significant proximal emergent large vessel occlusion. Electronically Signed   By: San Morelle M.D.   On: 12/06/2016 21:07   Mr Brain Wo Contrast  Result Date: 12/06/2016 CLINICAL DATA:  New onset left-sided weakness at 3 p.m. today. Abnormal speech. The examination had to be discontinued prior to completion due to need to stabilize the patient. EXAM: MRI HEAD WITHOUT CONTRAST MRA HEAD WITHOUT CONTRAST TECHNIQUE: Multiplanar, multiecho pulse sequences of the brain and surrounding structures were obtained without intravenous contrast. Angiographic images of the head were obtained using MRA technique without contrast. COMPARISON:  CT head without contrast from the same day. FINDINGS: MRI HEAD FINDINGS Brain: The diffusion-weighted images demonstrate no acute or subacute infarction. 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 Moderate confluent periventricular and scattered subcortical T2 hyperintensities are again noted bilaterally. White matter changes extend into the brainstem, similar to the prior study. No acute hemorrhage or mass lesion is present. The  ventricles are proportionate to the degree of atrophy. No significant extra-axial fluid collection is present. Vascular: Flow is present in the major intracranial arteries. Skull and upper cervical spine: The skullbase is unremarkable. Cervical spine is not imaged without sagittal T1 weighted imaging. Sinuses/Orbits: The paranasal sinuses and the mastoid air cells are clear. The globes and orbits are unremarkable. MRA HEAD FINDINGS The internal carotid artery is are within normal limits from the high cervical segments through the ICA termini bilaterally.  The A1 and M1 segments are normal. Anterior communicating artery is patent. There is some attenuation of distal MCA branch vessels bilaterally. There is focal signal loss in the proximal anterior right M2 segment. This may reflect a focal stenosis versus artifact. More distal branches are intact, suggesting artifact. The left vertebral artery is the dominant vessel. Only the very distal right vertebral artery is seen. The more proximal vessel may be occluded or hypoplastic. The basilar artery is within normal limits. Both posterior cerebral arteries originate from basilar tip. There is asymmetric attenuation of distal PCA branch vessels on the left. IMPRESSION: 1. No acute intracranial abnormality. 2. Confluent periventricular and scattered subcortical T2 hyperintensities bilaterally are moderately advanced for age. 3. Question narrowing of the anterior right M2 division. 4. Mild distal small vessel disease without other significant proximal emergent large vessel occlusion. Electronically Signed   By: San Morelle M.D.   On: 12/06/2016 21:07   Mr Cervical Spine Wo Contrast  Result Date: 12/07/2016 CLINICAL DATA:  Initial evaluation for left-sided weakness, ataxia. EXAM: MRI CERVICAL, THORACIC SPINE WITHOUT CONTRAST TECHNIQUE: Multiplanar and multiecho pulse sequences of the cervical spine, to include the craniocervical junction and cervicothoracic  junction, and thoracic spine, were obtained without intravenous contrast. COMPARISON:  None. FINDINGS: MRI CERVICAL SPINE FINDINGS Alignment: Straightening of the normal cervical lordosis. No listhesis. Vertebrae: Vertebral body heights are maintained. No evidence for acute or chronic fracture. Signal intensity within the vertebral body bone marrow is normal. No discrete or worrisome osseous lesions. No abnormal marrow edema. Cord: Signal intensity within the cervical spinal cord is normal. Posterior Fossa, vertebral arteries, paraspinal tissues: Encephalomalacia within the inferior right cerebellar hemisphere compatible with remote infarct. Probable chronic microvascular ischemic changes noted within the pons. Craniocervical junction normal. Paraspinous and prevertebral soft tissues within normal limits. Normal intravascular flow void present within the left vertebral artery. Right vertebral artery poorly visualized, and may be hypoplastic and/ or occluded. Disc levels: C2-C3: Mild diffuse disc bulge with bilateral uncovertebral hypertrophy, greater on the right. Resultant mild right C3 foraminal stenosis. No significant canal or left foraminal narrowing. C3-C4: Diffuse degenerative disc bulge with bilateral uncovertebral spurring. Mild bilateral facet hypertrophy. Resultant moderate bilateral C4 foraminal stenosis, slightly worse on the right. Posterior disc osteophyte flattens the ventral thecal sac without significant canal stenosis. C4-C5: Chronic diffuse degenerative disc osteophyte with intervertebral disc space narrowing. Mild bilateral facet arthrosis, greater on the left. Broad posterior disc osteophyte flattens and partially faces the ventral thecal sac. Minimal flattening of the right hemi cord without cord signal changes. Resultant mild to moderate canal stenosis. Fairly severe bilateral C5 foraminal narrowing, left greater than right. C5-C6: Chronic diffuse degenerative disc osteophyte with  intervertebral disc space narrowing. Bilateral facet degeneration with ligamentum flavum thickening. Broad posterior disc osteophyte flattens and largely effaces the ventral thecal sac. Slight inferior migration of disc material noted (series 3, image 7). Resultant moderate canal stenosis. Mild cord flattening on the left without cord signal changes. Severe bilateral C6 foraminal narrowing, left worse than right. C6-C7: Chronic diffuse degenerative disc osteophyte with intervertebral disc space narrowing. Left-sided facet degeneration. Ligamentum flavum thickening. Left E centric disc osteophyte complex flattens and effaces the ventral thecal sac. There is flattening of the left hemi cord without cord signal changes. Resultant severe canal stenosis. Thecal sac measures 5 mm in AP diameter (series 7, image 23). Resultant fairly severe bilateral C7 foraminal stenosis, left worse than right. C7-T1: Minimal disc bulge. Mild facet hypertrophy. No significant stenosis. MRI THORACIC  SPINE FINDINGS Alignment: Vertebral bodies normally aligned with preservation of the normal thoracic kyphosis. No listhesis. Vertebrae: Vertebral body heights are well maintained. No evidence for acute or chronic fracture. Signal intensity within the vertebral body bone marrow within normal limits. No discrete or worrisome osseous lesions. No abnormal marrow edema. Cord: Signal intensity within the thoracic spinal cord is normal. Conus medullaris terminates at the T12-L1 level. Paraspinal and other soft tissues: Paraspinous soft tissues within normal limits. Partially visualized lungs are clear. Visualized visceral structures unremarkable. Disc levels: T1-2:  Left-sided facet degeneration.  No stenosis. T2-3: Right greater than left facet hypertrophy. Small left foraminal disc protrusion (series 11, image 10). No significant stenosis. T3-4:  Right greater than left facet degeneration.  No stenosis. T4-5:  Unremarkable. T5-6: Small central/ left  central disc protrusion indents the ventral thecal sac. Posterior element hypertrophy. Resultant mild canal stenosis. T6-7: Right greater than left posterior element hypertrophy. No significant canal or foraminal stenosis. T7-8: Minimal disc bulge with posterior element hypertrophy. Resultant mild canal stenosis. No significant foraminal encroachment. T8-9: Diffuse disc bulge with superimposed right foraminal disc protrusion (Series 16, image 7). Posterior element hypertrophy. Mild flattening of the right hemi cord without cord signal changes. Mild canal stenosis. Mild bilateral foraminal narrowing. T9-10: Diffuse degenerative disc bulge. Prominent bilateral facet arthrosis. Ventral and posterior CSF is largely effaced with resultant moderate canal stenosis (series 16, image 11). No significant foraminal encroachment. T10-11: Small right central disc protrusion indents the ventral thecal sac. Posterior element hypertrophy. Mild canal and left foraminal stenosis. T11-12:  Mild facet hypertrophy.  No stenosis. T12-L1:  Unremarkable. IMPRESSION: MRI CERVICAL SPINE IMPRESSION: 1. Advanced multilevel degenerative spondylolysis as detailed above with resultant multilevel canal narrowing, most prevalent at the C5-6 and C6-7 levels where stenosis is moderate to severe in nature. No cord signal changes identified. 2. Multifactorial degenerative changes with advanced multilevel foraminal narrowing as above. Notable findings include moderate bilateral C4 foraminal stenosis, with severe bilateral C5, C6, and C7 foraminal narrowing, overall worse on the left. 3. 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 MRI THORACIC SPINE IMPRESSION: 1. Moderate multilevel degenerative facet degeneration within the thoracic spine as above. Changes most pronounced at the T9-10 level were there is resultant moderate canal stenosis. Please see above report for a full description these findings. 2.  No other acute abnormality within the thoracic spine. Electronically Signed   By: Jeannine Boga M.D.   On: 12/07/2016 03:52   Mr Thoracic Spine Wo Contrast  Result Date: 12/07/2016 CLINICAL DATA:  Initial evaluation for left-sided weakness, ataxia. EXAM: MRI CERVICAL, THORACIC SPINE WITHOUT CONTRAST TECHNIQUE: Multiplanar and multiecho pulse sequences of the cervical spine, to include the craniocervical junction and cervicothoracic junction, and thoracic spine, were obtained without intravenous contrast. COMPARISON:  None. FINDINGS: MRI CERVICAL SPINE FINDINGS Alignment: Straightening of the normal cervical lordosis. No listhesis. Vertebrae: Vertebral body heights are maintained. No evidence for acute or chronic fracture. Signal intensity within the vertebral body bone marrow is normal. No discrete or worrisome osseous lesions. No abnormal marrow edema. Cord: Signal intensity within the cervical spinal cord is normal. Posterior Fossa, vertebral arteries, paraspinal tissues: Encephalomalacia within the inferior right cerebellar hemisphere compatible with remote infarct. Probable chronic microvascular ischemic changes noted within the pons. Craniocervical junction normal. Paraspinous and prevertebral soft tissues within normal limits. Normal intravascular flow void present within the left vertebral artery. Right vertebral artery poorly visualized, and may be hypoplastic and/ or occluded. Disc levels: C2-C3:  Mild diffuse disc bulge with bilateral uncovertebral hypertrophy, greater on the right. Resultant mild right C3 foraminal stenosis. No significant canal or left foraminal narrowing. C3-C4: Diffuse degenerative disc bulge with bilateral uncovertebral spurring. Mild bilateral facet hypertrophy. Resultant moderate bilateral C4 foraminal stenosis, slightly worse on the right. Posterior disc osteophyte flattens the ventral thecal sac without significant canal stenosis. C4-C5: Chronic diffuse degenerative disc  osteophyte with intervertebral disc space narrowing. Mild bilateral facet arthrosis, greater on the left. Broad posterior disc osteophyte flattens and partially faces the ventral thecal sac. Minimal flattening of the right hemi cord without cord signal changes. Resultant mild to moderate canal stenosis. Fairly severe bilateral C5 foraminal narrowing, left greater than right. C5-C6: Chronic diffuse degenerative disc osteophyte with intervertebral disc space narrowing. Bilateral facet degeneration with ligamentum flavum thickening. Broad posterior disc osteophyte flattens and largely effaces the ventral thecal sac. Slight inferior migration of disc material noted (series 3, image 7). Resultant moderate canal stenosis. Mild cord flattening on the left without cord signal changes. Severe bilateral C6 foraminal narrowing, left worse than right. C6-C7: Chronic diffuse degenerative disc osteophyte with intervertebral disc space narrowing. Left-sided facet degeneration. Ligamentum flavum thickening. Left E centric disc osteophyte complex flattens and effaces the ventral thecal sac. There is flattening of the left hemi cord without cord signal changes. Resultant severe canal stenosis. Thecal sac measures 5 mm in AP diameter (series 7, image 23). Resultant fairly severe bilateral C7 foraminal stenosis, left worse than right. C7-T1: Minimal disc bulge. Mild facet hypertrophy. No significant stenosis. MRI THORACIC SPINE FINDINGS Alignment: Vertebral bodies normally aligned with preservation of the normal thoracic kyphosis. No listhesis. Vertebrae: Vertebral body heights are well maintained. No evidence for acute or chronic fracture. Signal intensity within the vertebral body bone marrow within normal limits. No discrete or worrisome osseous lesions. No abnormal marrow edema. Cord: Signal intensity within the thoracic spinal cord is normal. Conus medullaris terminates at the T12-L1 level. Paraspinal and other soft tissues:  Paraspinous soft tissues within normal limits. Partially visualized lungs are clear. Visualized visceral structures unremarkable. Disc levels: T1-2:  Left-sided facet degeneration.  No stenosis. T2-3: Right greater than left facet hypertrophy. Small left foraminal disc protrusion (series 11, image 10). No significant stenosis. T3-4:  Right greater than left facet degeneration.  No stenosis. T4-5:  Unremarkable. T5-6: Small central/ left central disc protrusion indents the ventral thecal sac. Posterior element hypertrophy. Resultant mild canal stenosis. T6-7: Right greater than left posterior element hypertrophy. No significant canal or foraminal stenosis. T7-8: Minimal disc bulge with posterior element hypertrophy. Resultant mild canal stenosis. No significant foraminal encroachment. T8-9: Diffuse disc bulge with superimposed right foraminal disc protrusion (Series 16, image 7). Posterior element hypertrophy. Mild flattening of the right hemi cord without cord signal changes. Mild canal stenosis. Mild bilateral foraminal narrowing. T9-10: Diffuse degenerative disc bulge. Prominent bilateral facet arthrosis. Ventral and posterior CSF is largely effaced with resultant moderate canal stenosis (series 16, image 11). No significant foraminal encroachment. T10-11: Small right central disc protrusion indents the ventral thecal sac. Posterior element hypertrophy. Mild canal and left foraminal stenosis. T11-12:  Mild facet hypertrophy.  No stenosis. T12-L1:  Unremarkable. IMPRESSION: MRI CERVICAL SPINE IMPRESSION: 1. Advanced multilevel degenerative spondylolysis as detailed above with resultant multilevel canal narrowing, most prevalent at the C5-6 and C6-7 levels where stenosis is moderate to severe in nature. No cord signal changes identified. 2. Multifactorial degenerative changes with advanced multilevel foraminal narrowing as above. Notable findings include moderate bilateral C4 foraminal stenosis, with  severe  bilateral C5, C6, and C7 foraminal narrowing, overall worse on the left. 3. 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 MRI THORACIC SPINE IMPRESSION: 1. Moderate multilevel degenerative facet degeneration within the thoracic spine as above. Changes most pronounced at the T9-10 level were there is resultant moderate canal stenosis. Please see above report for a full description these findings. 2. No other acute abnormality within the thoracic spine. Electronically Signed   By: Jeannine Boga M.D.   On: 12/07/2016 03:52   US Renal  Result Date: 12/24/2016 CLINICAL DATA:  Acute kidney injury. EXAM: RENAL / URINARY TRACT ULTRASOUND COMPLETE COMPARISON:  None. FINDINGS: Right Kidney: Length: 10.6 cm. Echogenicity within normal limits. No mass or hydronephrosis visualized. Left Kidney: Length: 10.4 cm. Echogenicity within normal limits. No mass or hydronephrosis visualized. Bladder: Appears normal for degree of bladder distention. IMPRESSION: Negative for hydronephrosis.  Normal exam. Electronically Signed   By: Inge Rise M.D.   On: 12/24/2016 12:05   Dg Chest Port 1 View  Result Date: 12/25/2016 CLINICAL DATA:  Sepsis. EXAM: PORTABLE CHEST 1 VIEW COMPARISON:  12/24/2016 and 10/29/2016 FINDINGS: Heart size and pulmonary vascularity are normal. Minimal atelectasis at both lung bases laterally. The lungs are otherwise clear. No bone abnormality. IMPRESSION: Minimal atelectasis at both lung bases. Electronically Signed   By: Lorriane Shire M.D.   On: 12/25/2016 10:19   Dg Chest Port 1 View  Result Date: 12/24/2016 CLINICAL DATA:  Confusion EXAM: PORTABLE CHEST 1 VIEW COMPARISON:  10/29/2016 FINDINGS: Normal heart size. Lungs are under aerated. Central and basilar airspace disease. Vascular congestion. No pneumothorax. IMPRESSION: There is vascular congestion with central basilar hazy airspace disease. Electronically Signed   By: Marybelle Killings M.D.    On: 12/24/2016 11:32   Dg Abd Portable 1v  Result Date: 12/26/2016 CLINICAL DATA:  Painful acid reflux. EXAM: PORTABLE ABDOMEN - 1 VIEW COMPARISON:  CT scan from yesterday FINDINGS: The lung bases are normal. Evaluation for free air is limited on supine imaging but none is seen. Colonic diverticulosis is seen. No other abnormalities. IMPRESSION: No acute abnormalities identified. The free air seen on the CT scan from yesterday is not visualized but today's study is limited due to supine imaging. Electronically Signed   By: Dorise Bullion III M.D   On: 12/26/2016 16:12   Dg Abd Portable 1v  Result Date: 12/25/2016 CLINICAL DATA:  71 year old male on with bowel perforation. EXAM: PORTABLE ABDOMEN - 1 VIEW COMPARISON:  Abdominal CT dated 12/25/2016 FINDINGS: Oral contrast from prior CT is noted within the distal small bowel and colon. There is no bowel dilatation or evidence of obstruction. No large free air identified. Prostatectomy surgical clips noted within the pelvis. There is degenerative changes of the lumbar spine. No acute osseous pathology. IMPRESSION: 1. No bowel dilatation or evidence of obstruction. 2. No large free air. Electronically Signed   By: Anner Crete M.D.   On: 12/25/2016 19:46   Ct Head Code Stroke W/o Cm  Result Date: 12/06/2016 CLINICAL DATA:  Code stroke. New onset gait problems. Last seen well at 3 p.m. today. EXAM: CT HEAD WITHOUT CONTRAST TECHNIQUE: Contiguous axial images were obtained from the base of the skull through the vertex without intravenous contrast. COMPARISON:  CT head without contrast 10/14/2015. MRI brain 08/30/2015. FINDINGS: Brain: Confluent periventricular white matter hypoattenuation bilaterally is similar the prior study. Basal ganglia are stable. No acute cortical infarct, hemorrhage, or mass lesion is present. Remote infarct  of the right cerebellum and remote lacunar infarcts of the basal ganglia bilaterally are stable. Vascular: Atherosclerotic  calcifications within the cavernous internal carotid artery's are stable. There is no hyperdense vessel. Calcifications are also present at the dural margin of the left vertebral artery. Skull: Calvarium is intact. No focal lytic or blastic lesions are present. Sinuses/Orbits: The paranasal sinuses and mastoid air cells are clear. ASPECTS [facility] Stroke Program Early CT Score) - Ganglionic level infarction (caudate, lentiform nuclei, internal capsule, insula, M1-M3 cortex): 7/7 - Supraganglionic infarction (M4-M6 cortex): 3/3 Total score (0-10 with 10 being normal): 10/10 IMPRESSION: 1. No acute intracranial abnormality or significant interval change. 2. Stable atrophy and white matter disease. 3. Stable remote lacunar infarcts involving the basal ganglia and periventricular white matter. 4. Remote infarct of the right inferior cerebellum is stable. 5. ASPECTS is 10/10 These results were text paged at the time of interpretation on 12/06/2016 at 8:08 pm to Dr. Leonel Ramsay. Electronically Signed   By: San Morelle M.D.   On: 12/06/2016 20:09    Lab Data:  CBC:  Recent Labs Lab 12/24/16 0515 12/24/16 0516 12/24/16 0813 12/25/16 0351 12/26/16 0301 12/27/16 0505  WBC 14.3*  --  15.3* 8.6 10.2 8.6  NEUTROABS 12.8*  --   --   --   --   --   HGB 12.1* 12.9* 11.9* 10.2* 10.1* 10.2*  HCT 38.3* 38.0* 37.0* 31.9* 31.1* 32.1*  MCV 93.6  --  93.0 92.5 92.3 93.6  PLT 275  --  284 243 240 086   Basic Metabolic Panel:  Recent Labs Lab 12/24/16 0515 12/24/16 0516 12/24/16 0813 12/25/16 0351 12/26/16 0301 12/27/16 0505  NA 141 141  --  141 141 141  K 3.9 3.9  --  3.6 3.6 3.7  CL 103 101  --  110 109 108  CO2 23  --   --  24 26 26   GLUCOSE 115* 109*  --  98 99 90  BUN 37* 38*  --  27* 18 13  CREATININE 4.72* 4.70* 4.16* 2.15* 1.42* 1.22  CALCIUM 8.4*  --   --  8.3* 8.5* 8.5*  MG  --   --  1.8  --   --   --   PHOS  --   --  5.3*  --   --   --    GFR: Estimated Creatinine Clearance: 55.4  mL/min (by C-G formula based on SCr of 1.22 mg/dL). Liver Function Tests:  Recent Labs Lab 12/24/16 0515  AST 20  ALT 16*  ALKPHOS 99  BILITOT 0.2*  PROT 6.4*  ALBUMIN 4.0   No results for input(s): LIPASE, AMYLASE in the last 168 hours. No results for input(s): AMMONIA in the last 168 hours. Coagulation Profile:  Recent Labs Lab 12/24/16 0515  INR 1.05   Cardiac Enzymes: No results for input(s): CKTOTAL, CKMB, CKMBINDEX, TROPONINI in the last 168 hours. BNP (last 3 results) No results for input(s): PROBNP in the last 8760 hours. HbA1C: No results for input(s): HGBA1C in the last 72 hours. CBG:  Recent Labs Lab 12/24/16 0907  GLUCAP 110*   Lipid Profile: No results for input(s): CHOL, HDL, LDLCALC, TRIG, CHOLHDL, LDLDIRECT in the last 72 hours. Thyroid Function Tests: No results for input(s): TSH, T4TOTAL, FREET4, T3FREE, THYROIDAB in the last 72 hours. Anemia Panel: No results for input(s): VITAMINB12, FOLATE, FERRITIN, TIBC, IRON, RETICCTPCT in the last 72 hours. Urine analysis:    Component Value Date/Time   COLORURINE YELLOW 12/24/2016  [place] (A) 12/24/2016 1418   LABSPEC 1.014 12/24/2016 1418   PHURINE 5.0 12/24/2016 1418   GLUCOSEU NEGATIVE 12/24/2016 1418   HGBUR MODERATE (A) 12/24/2016 1418   BILIRUBINUR NEGATIVE 12/24/2016 1418   KETONESUR NEGATIVE 12/24/2016 1418   PROTEINUR 30 (A) 12/24/2016 1418   UROBILINOGEN 0.2 05/09/2014 2115   NITRITE NEGATIVE 12/24/2016 1418   LEUKOCYTESUR NEGATIVE 12/24/2016 1418     Jenni Thew M.D. [facility] 12/27/2016, 10:32 AM  Pager: [phone] Between 7am to 7pm - call Pager - [phone]  After 7pm go to [email or web address] - password [email or web address]  Call night coverage person covering after 7pm

## 2016-12-28 LAB — CBC
HEMATOCRIT: 33.3 % — AB (ref 39.0–52.0)
HEMOGLOBIN: 10.6 g/dL — AB (ref 13.0–17.0)
MCH: 29 pg (ref 26.0–34.0)
MCHC: 31.8 g/dL (ref 30.0–36.0)
MCV: 91.2 fL (ref 78.0–100.0)
Platelets: 233 10*3/uL (ref 150–400)
RBC: 3.65 MIL/uL — AB (ref 4.22–5.81)
RDW: 12.8 % (ref 11.5–15.5)
WBC: 8 10*3/uL (ref 4.0–10.5)

## 2016-12-28 LAB — BASIC METABOLIC PANEL
ANION GAP: 9 (ref 5–15)
BUN: 8 mg/dL (ref 6–20)
CHLORIDE: 107 mmol/L (ref 101–111)
CO2: 25 mmol/L (ref 22–32)
Calcium: 8.6 mg/dL — ABNORMAL LOW (ref 8.9–10.3)
Creatinine, Ser: 1.16 mg/dL (ref 0.61–1.24)
GFR calc non Af Amer: >60 mL/min (ref >60)
Glucose, Bld: 106 mg/dL — ABNORMAL HIGH (ref 65–99)
POTASSIUM: 3.7 mmol/L (ref 3.5–5.1)
SODIUM: 141 mmol/L (ref 135–145)

## 2016-12-28 MED ORDER — METRONIDAZOLE 500 MG PO TABS
500.0000 mg | ORAL_TABLET | Freq: Three times a day (TID) | ORAL | Status: DC
  Administered 2016-12-28: 500 mg via ORAL
  Filled 2016-12-28: qty 1

## 2016-12-28 MED ORDER — PROMETHAZINE HCL 12.5 MG PO TABS: 12.5000 mg | ORAL_TABLET | Freq: Four times a day (QID) | ORAL | 0 refills | Status: DC | PRN

## 2016-12-28 MED ORDER — CEFUROXIME AXETIL 500 MG PO TABS: 500.0000 mg | ORAL_TABLET | Freq: Two times a day (BID) | ORAL | 0 refills | Status: DC

## 2016-12-28 MED ORDER — AMLODIPINE BESYLATE 5 MG PO TABS: 5.0000 mg | ORAL_TABLET | Freq: Every day | ORAL | 3 refills | Status: DC

## 2016-12-28 MED ORDER — METRONIDAZOLE 500 MG PO TABS: 500.0000 mg | ORAL_TABLET | Freq: Three times a day (TID) | ORAL | 0 refills | Status: DC

## 2016-12-28 MED ORDER — AMLODIPINE BESYLATE 5 MG PO TABS
5.0000 mg | ORAL_TABLET | Freq: Every day | ORAL | Status: DC
  Administered 2016-12-28: 5 mg via ORAL
  Filled 2016-12-28: qty 1

## 2016-12-28 MED ORDER — CEFUROXIME AXETIL 500 MG PO TABS
500.0000 mg | ORAL_TABLET | Freq: Two times a day (BID) | ORAL | Status: DC
  Filled 2016-12-28 (×2): qty 1

## 2016-12-28 MED ORDER — PANTOPRAZOLE SODIUM 40 MG PO TBEC: 40.0000 mg | DELAYED_RELEASE_TABLET | Freq: Two times a day (BID) | ORAL | 3 refills | Status: DC

## 2016-12-28 NOTE — Care Management Important Message (Signed)
Important Message  Patient Details  Name: Jason [name] MRN: [id number] Date of Birth: Apr 05, 1946   Medicare Important Message Given:  Yes    Nathen May 12/28/2016, 9:54 AM

## 2016-12-28 NOTE — Progress Notes (Signed)
Patient ID: Jason [name], male   DOB: 1945/09/11, 71 y.o.   MRN: [id number]  [facility] Surgery Progress Note     Subjective: CC- RLQ free air No complaints this morning. States that he should be going home today. Denies any abdominal pain, nausea, or vomiting. He is tolerating clear liquids. No fevers over night, and WBC is WNL. States that he had a colonoscopy/EGD last year.  Objective: Vital signs in last 24 hours: Temp:  [98.2 F (36.8 C)-99.1 F (37.3 C)] 98.2 F (36.8 C) (06/25 2145) Pulse Rate:  [61-73] 69 (06/26 0549) Resp:  [18-20] 18 (06/26 0549) BP: (162-193)/(74-82) 183/79 (06/26 0549) SpO2:  [93 %-100 %] 98 % (06/26 0549) Weight:  [196 lb 10.4 oz (89.2 kg)] 196 lb 10.4 oz (89.2 kg) (06/26 0500) Last BM Date: 12/26/16  Intake/Output from previous day: 06/25 0701 - 06/26 0700 In: 1360 [P.O.:1020; I.V.:240; IV Piggyback:100] Out: 1025 [Urine:1025] Intake/Output this shift: Total I/O In: -  Out: 275 [Urine:275]  PE: Gen:  Alert, NAD, pleasant HEENT: EOM's intact, pupils equal  Card:  RRR, no M/G/R heard Pulm:  CTAB, no W/R/R, effort normal Abd: Soft, NT/ND, +BS, no HSM, no hernia Skin: no rashes noted, warm and dry Psych: A&Ox3   Lab Results:   Recent Labs  12/27/16 0505 12/28/16 0644  WBC 8.6 8.0  HGB 10.2* 10.6*  HCT 32.1* 33.3*  PLT 230 233   BMET  Recent Labs  12/26/16 0301 12/27/16 0505  NA 141 141  K 3.6 3.7  CL 109 108  CO2 26 26  GLUCOSE 99 90  BUN 18 13  CREATININE 1.42* 1.22  CALCIUM 8.5* 8.5*   PT/INR No results for input(s): LABPROT, INR in the last 72 hours. CMP     Component Value Date/Time   NA 141 12/27/2016 0505   K 3.7 12/27/2016 0505   CL 108 12/27/2016 0505   CO2 26 12/27/2016 0505   GLUCOSE 90 12/27/2016 0505   BUN 13 12/27/2016 0505   CREATININE 1.22 12/27/2016 0505   CREATININE 1.03 05/23/2014 1239   CALCIUM 8.5 (L) 12/27/2016 0505   PROT 6.4 (L) 12/24/2016 0515   ALBUMIN 4.0 12/24/2016 0515   AST 20 12/24/2016 0515   ALT 16 (L) 12/24/2016 0515   ALKPHOS 99 12/24/2016 0515   BILITOT 0.2 (L) 12/24/2016 0515   GFRNONAA 59 (L) 12/27/2016 0505   GFRNONAA 75 05/23/2014 1239   GFRAA >60 12/27/2016 0505   GFRAA 86 05/23/2014 1239   Lipase     Component Value Date/Time   LIPASE 45 09/25/2015 1325       Studies/Results: Dg Abd Portable 1v  Result Date: 12/26/2016 CLINICAL DATA:  Painful acid reflux. EXAM: PORTABLE ABDOMEN - 1 VIEW COMPARISON:  CT scan from yesterday FINDINGS: The lung bases are normal. Evaluation for free air is limited on supine imaging but none is seen. Colonic diverticulosis is seen. No other abnormalities. IMPRESSION: No acute abnormalities identified. The free air seen on the CT scan from yesterday is not visualized but today's study is limited due to supine imaging. Electronically Signed   By: Dorise Bullion III M.D   On: 12/26/2016 16:12    Anti-infectives: Anti-infectives    Start     Dose/Rate Route Frequency Ordered Stop   12/28/16 0800  cefUROXime (CEFTIN) tablet 500 mg     500 mg Oral 2 times daily with meals 12/28/16 0725     12/28/16 0730  metroNIDAZOLE (FLAGYL) tablet 500 mg  500 mg Oral Every 8 hours 12/28/16 0725     12/28/16 0000  cefUROXime (CEFTIN) 500 MG tablet     500 mg Oral 2 times daily with meals 12/28/16 0736     12/28/16 0000  metroNIDAZOLE (FLAGYL) 500 MG tablet     500 mg Oral 3 times daily 12/28/16 0736     12/26/16 0600  vancomycin (VANCOCIN) IVPB 1000 mg/200 mL premix  Status:  Discontinued     1,000 mg 200 mL/hr over 60 Minutes Intravenous Every 48 hours 12/24/16 0537 12/25/16 0950   12/25/16 2030  metroNIDAZOLE (FLAGYL) IVPB 500 mg  Status:  Discontinued     500 mg 100 mL/hr over 60 Minutes Intravenous Every 8 hours 12/25/16 1924 12/28/16 0725   12/25/16 2000  cefTRIAXone (ROCEPHIN) 1 g in dextrose 5 % 50 mL IVPB  Status:  Discontinued     1 g 100 mL/hr over 30 Minutes Intravenous Every 24 hours 12/25/16 1930  12/28/16 0725   12/25/16 1030  vancomycin (VANCOCIN) 1,250 mg in sodium chloride 0.9 % 250 mL IVPB  Status:  Discontinued     1,250 mg 166.7 mL/hr over 90 Minutes Intravenous Every 24 hours 12/25/16 0950 12/26/16 1152   12/24/16 0600  ceFEPIme (MAXIPIME) 1 g in dextrose 5 % 50 mL IVPB  Status:  Discontinued     1 g 100 mL/hr over 30 Minutes Intravenous Every 24 hours 12/24/16 0525 12/25/16 1923   12/24/16 0530  vancomycin (VANCOCIN) IVPB 1000 mg/200 mL premix     1,000 mg 200 mL/hr over 60 Minutes Intravenous  Once 12/24/16 0525 12/24/16 0658       Assessment/Plan CAD HTN HLD CKDIII H/o CVA Bipolar disease GERD Severe deconditioning  RLQ free air - seen on CT scan, perforated right colon diverticulum vs perforated gastric ulcer; states he takes Aleve about 4-5 days per week.  - Sepsis - improving/resolved - no fever, nml lactate, wbc now normal - he is on oral abx - patient is still nontender and he is tolerating clear liquids - advance to FLD and advance diet as tolerated.  - ok for discharge from our standpoint on abx if he tolerates diet. Advised to avoid NSAIDs, and follow-up with PCP to schedule outpatient EGD/colonoscopy  ID - ceftin 6/26>>; flagyl 6/23>>; rocephin 6/23>>6/26; vanco 6/22>>6/24 FEN - full liquids/advance as tolerated VTE - SCDs, heparin   LOS: 4 days    Jerrye Beavers , [facility] 12/28/2016, 8:22 AM Pager: [phone] Consults: [phone] Mon-Fri 7:00 am-4:30 pm Sat-Sun 7:00 am-11:30 am

## 2016-12-28 NOTE — Progress Notes (Signed)
PT Cancellation Note  Patient Details Name: Jason [name] MRN: [id number] DOB: 1946-03-13   Cancelled Treatment:    Reason Eval/Treat Not Completed: (P) Other (comment) (Pt anxious to d/c with items packed on bed.  Pt reports that he does not wish to perform therapy as he is only interested in discharging home.  Informed patient's nurse.  Pt denies any questions or concerns in regards to mobility.  )   Domnique Vanegas J Decklyn Hyder 12/28/2016, 11:56 AM  Governor Rooks, PTA pager [phone]

## 2016-12-28 NOTE — Discharge Summary (Addendum)
Physician Discharge Summary   Patient ID: Jason [name] MRN: [id number] DOB/AGE: [birth year] 71 y.o.  Admit date: 12/24/2016 Discharge date: 12/28/2016  Primary Care Physician:  PCP at [facility]  Discharge Diagnoses:    . Sepsis ([place]) . Bipolar 1 disorder ([place]) . AKI (acute kidney injury) ([place]) . HTN (hypertension) . GERD (gastroesophageal reflux disease)   Consults:  Gen Surgery  Cardiology  Recommendations for Outpatient Follow-up:  1. Patient recommended to follow-up with his gastroenterologist at [facility] for outpatient EGD and colonoscopy next 3-4 weeks 2. Please repeat CBC/BMET at next visit 3. Patient was recommended to avoid Aleve, Motrin or any NSAIDs 4.  Noncontrast chest chest CT recommended in 3-6 months.   DIET: Soft diet    Allergies:   Allergies  Allergen Reactions  . Penicillins Other (See Comments)    Tested positive for this Has patient had a PCN reaction causing immediate rash, facial/tongue/throat swelling, SOB or lightheadedness with hypotension: Unknown Has patient had a PCN reaction causing severe rash involving mucus membranes or skin necrosis: Unknown Has patient had a PCN reaction that required hospitalization: Unknown Has patient had a PCN reaction occurring within the last 10 years: No If all of the above answers are "NO", then may proceed with Cephalosporin use.      DISCHARGE MEDICATIONS: Current Discharge Medication List    START taking these medications   Details  amLODipine (NORVASC) 5 MG tablet Take 1 tablet (5 mg total) by mouth daily. Qty: 30 tablet, Refills: 3    cefUROXime (CEFTIN) 500 MG tablet Take 1 tablet (500 mg total) by mouth 2 (two) times daily with a meal. X 10 days Qty: 20 tablet, Refills: 0    metroNIDAZOLE (FLAGYL) 500 MG tablet Take 1 tablet (500 mg total) by mouth 3 (three) times daily. X 10 days Qty: 30 tablet, Refills: 0    promethazine (PHENERGAN) 12.5 MG tablet Take 1 tablet (12.5 mg total) by mouth every 6 (six)  hours as needed for nausea or vomiting. Qty: 30 tablet, Refills: 0      CONTINUE these medications which have CHANGED   Details  pantoprazole (PROTONIX) 40 MG tablet Take 1 tablet (40 mg total) by mouth 2 (two) times daily before a meal. Qty: 60 tablet, Refills: 3      CONTINUE these medications which have NOT CHANGED   Details  aspirin 325 MG tablet Take 325 mg by mouth daily.    atorvastatin (LIPITOR) 40 MG tablet Take 1 tablet (40 mg total) by mouth daily at 6 PM. Qty: 30 tablet, Refills: 0    Calcium Carbonate (CALCIUM 600 PO) Take 2 tablets by mouth daily.    Carboxymethylcellulose Sodium (THERATEARS OP) Place 1 drop into both eyes 4 (four) times daily.    Cholecalciferol (VITAMIN D-3) 1000 units CAPS Take 1,000 Units by mouth daily.    ferrous sulfate 325 (65 FE) MG tablet Take 1 tablet (325 mg total) by mouth 2 (two) times daily with a meal. Qty: 60 tablet, Refills: 0    FLUoxetine (PROZAC) 20 MG capsule Take 60 mg by mouth daily.    gabapentin (NEURONTIN) 600 MG tablet Take 1,200 mg by mouth at bedtime.    Glucosamine Sulfate 1000 MG CAPS Take 2,000 mg by mouth daily.    lamoTRIgine (LAMICTAL) 25 MG tablet Take 100 mg by mouth at bedtime.     MENTHOL-METHYL SALICYLATE EX Apply 1 application topically See admin instructions. TO AFFECTED AREA(S) THREE TIMES A DAY AS NEEDED FOR PAIN  mirtazapine (REMERON) 45 MG tablet Take 45 mg by mouth at bedtime.    Omega-3 Fatty Acids (FISH OIL) 1000 MG CAPS Take 2,000 mg by mouth at bedtime.     Oxycodone HCl 10 MG TABS Take 10 mg by mouth 3 (three) times daily as needed (for pain).    oxymetazoline (AFRIN) 0.05 % nasal spray Place 1-2 sprays into both nostrils 2 (two) times daily as needed for congestion.    prazosin (MINIPRESS) 1 MG capsule Take 3 mg by mouth at bedtime.      STOP taking these medications     omeprazole (PRILOSEC) 20 MG capsule          Brief H and P: For complete details please refer to admission  H and P, but in brief Patient is a 71 year old male with CAD, hypertension, hyperlipidemia, CVA, bipolar disease, anemia, GERD presented with sudden weakness, feelings of hot and cold for several weeks, seen in ED on 6/4. Patient presented to ED with extreme weakness, diaphoretic, feverish. Temp 96.3 at the time of admission, hypothermic, BP 101/82, tachycardiac 107, O2 sats 98% on room air Patient was admitted for possible sepsis due to stepdown unit Patient also reported nausea, vomiting, diarrhea for last 1 week. No abdominal pain.   Hospital Course:   Sepsis [facility]) : Likely due to the perforated colon/diverticulum versus gastric ulcer - Presented with nausea, vomiting and diarrhea for last 1 week, lactic acidosis  - Blood cultures remained negative to date, UA was negative - chest x-ray showed vascular congestion and central basilar hazy airspace disease - Sepsis physiology improved   Right lower quadrant free air due to perforated right colon diverticulum versus perforated gastric ulcer - Possibly the cause of #1, had nausea, vomiting and diarrhea for last 1 week PTA -Patient was placed on nothing by mouth status, IV fluids for hydration. General surgery was consulted.  - Changed to IV Protonix, added GI cocktail, KUB showed no worsening of free air - Feeling better, he has tolerated liquid diet and advance to solids. Patient was recommended to follow up with his gastroenterologist at [facility] and needs outpatient endoscopy, colonoscopy in next 3-4 weeks   Acute kidney injury on chronic kidney disease stage III: Baseline creatinine 1.6-2 - Creatinine 4.7 at the time of admission, due to profound dehydration from nausea, vomiting, diarrhea in the last 1 week, sepsis - Renal ultrasound showed no hydronephrosis or obstruction - Creatinine has improved to 1.1 at baseline on discharge  Pulmonary nodule - CT cervical spine on 6/4 showed pulmonary nodule of right lung apex measuring 10 mm  increased by 1 mL and compared to neck CT on 09/01/15 -  CT chest showed nodular consolidations in the right upper lobe and right lower lobe favored to represent chronic areas of nodular scarring/fibrosis, less likely to be acute pneumonia or neoplastic process. -  Noncontrast chest chest CT recommended in 3-6 months.  Lactic acidosis, dehydration -  resolved with IV fluid hydration    Bipolar 1 disorder ([facility]) - Continue Lamictal, Remeron, fluoxetine    HTN (hypertension) - Currently stable, continue prazosin  Prolonged QTC - Cardiology was consulted, there was a question of possible atrial flutter on the EKG however per cardiology, it is sinus tachycardia secondary to elevated lactic acid and sepsis, recommended follow electrolytes and correct as needed    GERD (gastroesophageal reflux disease) - Continue PPI  Severe deconditioning  -Improved, patient is back to his baseline, declined PT evaluation at the time of discharge  Malnutrition (moderate)  - related to chronic illness (heart DZ, depression, stroke) as evidenced by mild depletion of body fat, mild depletion of muscle mass.  - Nutrition consult was obtained  Day of Discharge BP (!) 183/79 (BP Location: Left Arm)   Pulse 69   Temp 98.2 F (36.8 C) (Oral)   Resp 18   Ht 5\' 4"  (1.626 m)   Wt 89.2 kg (196 lb 10.4 oz)   SpO2 98%   BMI 33.76 kg/m   Physical Exam: General: Alert and awake oriented x3 not in any acute distress. HEENT: anicteric sclera, pupils reactive to light and accommodation CVS: S1-S2 clear no murmur rubs or gallops Chest: clear to auscultation bilaterally, no wheezing rales or rhonchi Abdomen: soft nontender, nondistended, normal bowel sounds Extremities: no cyanosis, clubbing or edema noted bilaterally Neuro: Cranial nerves II-XII intact, no focal neurological deficits   The results of significant diagnostics from this hospitalization (including imaging, microbiology, ancillary and  laboratory) are listed below for reference.    LAB RESULTS: Basic Metabolic Panel:  Recent Labs Lab 12/24/16 0813  12/27/16 0505 12/28/16 0644  NA  --   < > 141 141  K  --   < > 3.7 3.7  CL  --   < > 108 107  CO2  --   < > 26 25  GLUCOSE  --   < > 90 106*  BUN  --   < > 13 8  CREATININE 4.16*  < > 1.22 1.16  CALCIUM  --   < > 8.5* 8.6*  MG 1.8  --   --   --   PHOS 5.3*  --   --   --   < > = values in this interval not displayed. Liver Function Tests:  Recent Labs Lab 12/24/16 0515  AST 20  ALT 16*  ALKPHOS 99  BILITOT 0.2*  PROT 6.4*  ALBUMIN 4.0   No results for input(s): LIPASE, AMYLASE in the last 168 hours. No results for input(s): AMMONIA in the last 168 hours. CBC:  Recent Labs Lab 12/24/16 0515  12/27/16 0505 12/28/16 0644  WBC 14.3*  < > 8.6 8.0  NEUTROABS 12.8*  --   --   --   HGB 12.1*  < > 10.2* 10.6*  HCT 38.3*  < > 32.1* 33.3*  MCV 93.6  < > 93.6 91.2  PLT 275  < > 230 233  < > = values in this interval not displayed. Cardiac Enzymes: No results for input(s): CKTOTAL, CKMB, CKMBINDEX, TROPONINI in the last 168 hours. BNP: Invalid input(s): POCBNP CBG:  Recent Labs Lab 12/24/16 0907  GLUCAP 110*    Significant Diagnostic Studies:  Ct Abdomen Pelvis Wo Contrast  Result Date: 12/25/2016 CLINICAL DATA:  Patient has N/V/D for about 1 week with fever and possible sepsis. Per patient he does not have any complaints excepted he is real weak, looking for source of infection" EXAM: CT CHEST, ABDOMEN AND PELVIS WITHOUT CONTRAST TECHNIQUE: Multidetector CT imaging of the chest, abdomen and pelvis was performed following the standard protocol without IV contrast. COMPARISON:  None. FINDINGS: CT CHEST FINDINGS Cardiovascular: Scattered atherosclerosis of the normal caliber thoracic aorta. Heart size is normal. No pericardial effusion. Coronary artery calcifications noted. Mediastinum/Nodes: No mass or enlarged lymph nodes within the mediastinum or  perihilar regions. Esophagus is unremarkable. Trachea and central bronchi are unremarkable. Lungs/Pleura: 1 cm nodular consolidation in the right lung apex (series 5, image 25). 1.4 cm nodular consolidation at  the right lung apex, most likely nodular scarring/atelectasis as it is contiguous with adjacent linear scarring/atelectasis (image 114). Left lung is clear. No pleural effusion or pneumothorax. Musculoskeletal: Mild degenerative spurring within the thoracic spine. No acute or suspicious osseous finding. Superficial soft tissues are unremarkable. CT ABDOMEN PELVIS FINDINGS Hepatobiliary: No focal liver abnormality is seen. No gallstones, gallbladder wall thickening, or biliary dilatation. Pancreas: Unremarkable. No pancreatic ductal dilatation or surrounding inflammatory changes. Spleen: Normal in size without focal abnormality. Adrenals/Urinary Tract: Left adrenal mass measuring 1.9 cm, Hounsfield unit measurement of 21. Right adrenal gland appears normal. Kidneys appear normal without mass, stone or hydronephrosis. 2 mm stone within the proximal left ureter. Bladder is decompressed. Stomach/Bowel: Bowel is normal in caliber. Extensive diverticulosis of the sigmoid colon. Moderate degree of diverticulosis throughout the remainder of the colon. Vascular/Lymphatic: Aortic atherosclerosis. No enlarged lymph nodes seen. Reproductive: Status post prostatectomy. Other: Small foci of free intraperitoneal air within the lower right abdomen. Trace free fluid in the right paracolic gutter. Musculoskeletal: Degenerative changes throughout the thoracolumbar spine, mild to moderate in degree, with prominent degenerative facet arthropathies in the lower lumbar spine contributing to neural foramen narrowings at the L4-5 and L5-S1 levels bilaterally. IMPRESSION: 1. Small foci of free intraperitoneal air within the right lower abdomen, indicating bowel perforation. The source of the free intraperitoneal air is unclear, most  likely related to perforated diverticulum within the adjacent right colon given its location and the trace free fluid in the right paracolic gutter, but no convincing diverticulitis is identified. Of note, the adjacent small bowel in the right abdomen appears normal. Another possible source for the free air would be the stomach as there is suggestion of gastric wall thickening at the level of the antrum and perigastric inflammation. The free air, however, is a fairly large distance from the stomach which makes the stomach a somewhat less likely source. 2. Diffuse colonic diverticulosis, most prominent within the sigmoid colon. 3. 2 mm nonobstructing left ureteral stone. Associated hydronephrosis. 4. Nodular consolidations within the right upper lobe and right lower lobe. 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 These are less likely acute pneumonia or neoplastic process. Non-contrast chest CT at 3-6 months is recommended. If the nodules are stable at time of repeat CT, then future CT at 18-24 months (from today's scan) is considered optional for low-risk patients, but is recommended for high-risk patients. This recommendation follows the consensus statement: Guidelines for Management of Incidental Pulmonary Nodules Detected on CT Images: From the Fleischner Society 2017; Radiology 2017; 284:228-243. 5. **An incidental finding of potential clinical significance has been found. Left adrenal mass measuring 1.9 cm, with Hounsfield unit measurement not compatible with benign lipid rich adrenal adenoma based on this noncontrast exam. Per consensus guidelines, this is a probably benign finding for which a follow-up adrenal protocol CT is recommended in 12 months.** 6. Aortic atherosclerosis. 7. 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 If  any associated radiculopathic symptoms, would consider nonemergent lumbar spine MRI to exclude associated nerve root impingement. At minimum, recommend close clinical follow-up and short-term follow-up CT to ensure stability or resolution of the acute/significant findings detailed above. Critical Value/emergent results were called by telephone at the time of interpretation on 12/25/2016 at 7:11 pm to Dr. Nira Conn RAI , who verbally acknowledged these results. Electronically Signed   By: Roxy Horseman.D.  On: 12/25/2016 19:31   Ct Head Wo Contrast  Result Date: 12/24/2016 CLINICAL DATA:  Confusion, sepsis, slurred speech EXAM: CT HEAD WITHOUT CONTRAST TECHNIQUE: Contiguous axial images were obtained from the base of the skull through the vertex without intravenous contrast. COMPARISON:  12/06/2016 FINDINGS: Brain: Stable brain atrophy pattern with chronic white matter microvascular ischemic changes throughout the cerebral hemispheres. Remote bilateral lacunar-type basilar ganglia infarctions and right cerebellar infarct. No acute intracranial hemorrhage, mass lesion, new infarction, focal mass effect or edema. Negative for herniation, midline shift, hydrocephalus, or extra-axial fluid collection. Cisterns are patent. Vascular: Atherosclerosis of the intracranial vessels. No hyperdense vessel demonstrated. Skull: Normal. Negative for fracture or focal lesion. Sinuses/Orbits: No acute finding. Other: None. IMPRESSION: Stable brain atrophy, chronic white matter microvascular ischemic changes and remote infarcts as above. No interval change or acute process by noncontrast CT. Electronically Signed   By: Jerilynn Mages.  Shick M.D.   On: 12/24/2016 07:53   Ct Chest Wo Contrast  Result Date: 12/25/2016 CLINICAL DATA:  Patient has N/V/D for about 1 week with fever and possible sepsis. Per patient he does not have any complaints excepted he is real weak, looking for source of infection" EXAM: CT CHEST, ABDOMEN AND PELVIS  WITHOUT CONTRAST TECHNIQUE: Multidetector CT imaging of the chest, abdomen and pelvis was performed following the standard protocol without IV contrast. COMPARISON:  None. FINDINGS: CT CHEST FINDINGS Cardiovascular: Scattered atherosclerosis of the normal caliber thoracic aorta. Heart size is normal. No pericardial effusion. Coronary artery calcifications noted. Mediastinum/Nodes: No mass or enlarged lymph nodes within the mediastinum or perihilar regions. Esophagus is unremarkable. Trachea and central bronchi are unremarkable. Lungs/Pleura: 1 cm nodular consolidation in the right lung apex (series 5, image 25). 1.4 cm nodular consolidation at the right lung apex, most likely nodular scarring/atelectasis as it is contiguous with adjacent linear scarring/atelectasis (image 114). Left lung is clear. No pleural effusion or pneumothorax. Musculoskeletal: Mild degenerative spurring within the thoracic spine. No acute or suspicious osseous finding. Superficial soft tissues are unremarkable. CT ABDOMEN PELVIS FINDINGS Hepatobiliary: No focal liver abnormality is seen. No gallstones, gallbladder wall thickening, or biliary dilatation. Pancreas: Unremarkable. No pancreatic ductal dilatation or surrounding inflammatory changes. Spleen: Normal in size without focal abnormality. Adrenals/Urinary Tract: Left adrenal mass measuring 1.9 cm, Hounsfield unit measurement of 21. Right adrenal gland appears normal. Kidneys appear normal without mass, stone or hydronephrosis. 2 mm stone within the proximal left ureter. Bladder is decompressed. Stomach/Bowel: Bowel is normal in caliber. Extensive diverticulosis of the sigmoid colon. Moderate degree of diverticulosis throughout the remainder of the colon. Vascular/Lymphatic: Aortic atherosclerosis. No enlarged lymph nodes seen. Reproductive: Status post prostatectomy. Other: Small foci of free intraperitoneal air within the lower right abdomen. Trace free fluid in the right paracolic  gutter. Musculoskeletal: Degenerative changes throughout the thoracolumbar spine, mild to moderate in degree, with prominent degenerative facet arthropathies in the lower lumbar spine contributing to neural foramen narrowings at the L4-5 and L5-S1 levels bilaterally. IMPRESSION: 1. Small foci of free intraperitoneal air within the right lower abdomen, indicating bowel perforation. The source of the free intraperitoneal air is unclear, most likely related to perforated diverticulum within the adjacent right colon given its location and the trace free fluid in the right paracolic gutter, but no convincing diverticulitis is identified. Of note, the adjacent small bowel in the right abdomen appears normal. Another possible source for the free air would be the stomach as there is suggestion of gastric wall thickening at the level of the antrum  and perigastric inflammation. The free air, however, is a fairly large distance from the stomach which makes the stomach a somewhat less likely source. 2. Diffuse colonic diverticulosis, most prominent within the sigmoid colon. 3. 2 mm nonobstructing left ureteral stone. Associated hydronephrosis. 4. Nodular consolidations within the right upper lobe and right lower lobe. 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 These are less likely acute pneumonia or neoplastic process. Non-contrast chest CT at 3-6 months is recommended. If the nodules are stable at time of repeat CT, then future CT at 18-24 months (from today's scan) is considered optional for low-risk patients, but is recommended for high-risk patients. This recommendation follows the consensus statement: Guidelines for Management of Incidental Pulmonary Nodules Detected on CT Images: From the Fleischner Society 2017; Radiology 2017; 284:228-243. 5. **An incidental finding of potential clinical significance has been found. Left adrenal mass measuring 1.9 cm, with Hounsfield unit measurement not  compatible with benign lipid rich adrenal adenoma based on this noncontrast exam. Per consensus guidelines, this is a probably benign finding for which a follow-up adrenal protocol CT is recommended in 12 months.** 6. Aortic atherosclerosis. 7. 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 If any associated radiculopathic symptoms, would consider nonemergent lumbar spine MRI to exclude associated nerve root impingement. At minimum, recommend close clinical follow-up and short-term follow-up CT to ensure stability or resolution of the acute/significant findings detailed above. Critical Value/emergent results were called by telephone at the time of interpretation on 12/25/2016 at 7:11 pm to Dr. Nira Conn RAI , who verbally acknowledged these results. Electronically Signed   By: Franki Cabot M.D.   On: 12/25/2016 19:31   US Renal  Result Date: 12/24/2016 CLINICAL DATA:  Acute kidney injury. EXAM: RENAL / URINARY TRACT ULTRASOUND COMPLETE COMPARISON:  None. FINDINGS: Right Kidney: Length: 10.6 cm. Echogenicity within normal limits. No mass or hydronephrosis visualized. Left Kidney: Length: 10.4 cm. Echogenicity within normal limits. No mass or hydronephrosis visualized. Bladder: Appears normal for degree of bladder distention. IMPRESSION: Negative for hydronephrosis.  Normal exam. Electronically Signed   By: Inge Rise M.D.   On: 12/24/2016 12:05   Dg Chest Port 1 View  Result Date: 12/25/2016 CLINICAL DATA:  Sepsis. EXAM: PORTABLE CHEST 1 VIEW COMPARISON:  12/24/2016 and 10/29/2016 FINDINGS: Heart size and pulmonary vascularity are normal. Minimal atelectasis at both lung bases laterally. The lungs are otherwise clear. No bone abnormality. IMPRESSION: Minimal atelectasis at both lung bases. Electronically Signed   By: Lorriane Shire M.D.   On: 12/25/2016 10:19    Dg Chest Port 1 View  Result Date: 12/24/2016 CLINICAL DATA:  Confusion EXAM: PORTABLE CHEST 1 VIEW COMPARISON:  10/29/2016 FINDINGS: Normal heart size. Lungs are under aerated. Central and basilar airspace disease. Vascular congestion. No pneumothorax. IMPRESSION: There is vascular congestion with central basilar hazy airspace disease. Electronically Signed   By: Marybelle Killings M.D.   On: 12/24/2016 11:32   Dg Abd Portable 1v  Result Date: 12/25/2016 CLINICAL DATA:  72 year old male on with bowel perforation. EXAM: PORTABLE ABDOMEN - 1 VIEW COMPARISON:  Abdominal CT dated 12/25/2016 FINDINGS: Oral contrast from prior CT is noted within the distal small bowel and colon. There is no bowel dilatation or evidence of obstruction. No large free air identified. Prostatectomy surgical clips noted within the pelvis. There is degenerative changes of the lumbar spine. No acute osseous  pathology. IMPRESSION: 1. No bowel dilatation or evidence of obstruction. 2. No large free air. Electronically Signed   By: Anner Crete M.D.   On: 12/25/2016 19:46    2D ECHO:   Disposition and Follow-up: Discharge Instructions    Diet - low sodium heart healthy    Complete by:  As directed    Discharge instructions    Complete by:  As directed    Soft diet for next few days  Avoid Aleve, Motrin or any NSAIDs  Please see your gastroenterologist at [facility] for EGD and colonoscopy in next 3-4 weeks outpatient.   Increase activity slowly    Complete by:  As directed        DISPOSITION: home    DISCHARGE FOLLOW-UP  follow up with his PCP at [facility] in 2 weeks    Time spent on Discharge: 39mins   Signed:   Estill Cotta M.D. [facility] 12/28/2016, 12:30 PM Pager: [phone]

## 2016-12-28 NOTE — Progress Notes (Signed)
Jason [name] to be D/C'd Home per MD order.  Discussed with the patient and all questions fully answered.  VSS, Skin clean, dry and intact without evidence of skin break down, no evidence of skin tears noted. IV catheter discontinued intact. Site without signs and symptoms of complications. Dressing and pressure applied.  An After Visit Summary was printed and given to the patient. Patient received prescription.  D/c education completed with patient/family including follow up instructions, medication list, d/c activities limitations if indicated, with other d/c instructions as indicated by MD - patient able to verbalize understanding, all questions fully answered.   Patient instructed to return to ED, call 911, or call MD for any changes in condition.   Patient escorted via [place], and D/C home via private auto. Allergies as of 12/28/2016      Reactions   Penicillins Other (See Comments)   Tested positive for this Has patient had a PCN reaction causing immediate rash, facial/tongue/throat swelling, SOB or lightheadedness with hypotension: Unknown Has patient had a PCN reaction causing severe rash involving mucus membranes or skin necrosis: Unknown Has patient had a PCN reaction that required hospitalization: Unknown Has patient had a PCN reaction occurring within the last 10 years: No If all of the above answers are "NO", then may proceed with Cephalosporin use.      Medication List    STOP taking these medications   omeprazole 20 MG capsule Commonly known as:  PRILOSEC     TAKE these medications   amLODipine 5 MG tablet Commonly known as:  NORVASC Take 1 tablet (5 mg total) by mouth daily.   aspirin 325 MG tablet Take 325 mg by mouth daily.   atorvastatin 40 MG tablet Commonly known as:  LIPITOR Take 1 tablet (40 mg total) by mouth daily at 6 PM. What changed:  when to take this   CALCIUM 600 PO Take 2 tablets by mouth daily.   cefUROXime 500 MG tablet Commonly known  as:  CEFTIN Take 1 tablet (500 mg total) by mouth 2 (two) times daily with a meal. X 10 days   ferrous sulfate 325 (65 FE) MG tablet Take 1 tablet (325 mg total) by mouth 2 (two) times daily with a meal. What changed:  how much to take  when to take this   Fish Oil 1000 MG Caps Take 2,000 mg by mouth at bedtime.   FLUoxetine 20 MG capsule Commonly known as:  PROZAC Take 60 mg by mouth daily.   gabapentin 600 MG tablet Commonly known as:  NEURONTIN Take 1,200 mg by mouth at bedtime.   Glucosamine Sulfate 1000 MG Caps Take 2,000 mg by mouth daily.   lamoTRIgine 25 MG tablet Commonly known as:  LAMICTAL Take 100 mg by mouth at bedtime.   MENTHOL-METHYL SALICYLATE EX Apply 1 application topically See admin instructions. TO AFFECTED AREA(S) THREE TIMES A DAY AS NEEDED FOR PAIN   metroNIDAZOLE 500 MG tablet Commonly known as:  FLAGYL Take 1 tablet (500 mg total) by mouth 3 (three) times daily. X 10 days   mirtazapine 45 MG tablet Commonly known as:  REMERON Take 45 mg by mouth at bedtime.   Oxycodone HCl 10 MG Tabs Take 10 mg by mouth 3 (three) times daily as needed (for pain).   oxymetazoline 0.05 % nasal spray Commonly known as:  AFRIN Place 1-2 sprays into both nostrils 2 (two) times daily as needed for congestion.   pantoprazole 40 MG tablet Commonly known as:  PROTONIX Take 1 tablet (40 mg total) by mouth 2 (two) times daily before a meal. What changed:  when to take this   prazosin 1 MG capsule Commonly known as:  MINIPRESS Take 3 mg by mouth at bedtime.   promethazine 12.5 MG tablet Commonly known as:  PHENERGAN Take 1 tablet (12.5 mg total) by mouth every 6 (six) hours as needed for nausea or vomiting.   THERATEARS OP Place 1 drop into both eyes 4 (four) times daily.   Vitamin D-3 1000 units Caps Take 1,000 Units by mouth daily.      Jason [name] 12/28/2016 1:27 PM

## 2016-12-29 LAB — CULTURE, BLOOD (ROUTINE X 2)
CULTURE: NO GROWTH
Culture: NO GROWTH
Special Requests: ADEQUATE
Special Requests: ADEQUATE

## 2016-12-30 LAB — CULTURE, BLOOD (ROUTINE X 2)
CULTURE: NO GROWTH
Culture: NO GROWTH
Special Requests: ADEQUATE

## 2016-12-31 LAB — ALDOSTERONE + RENIN ACTIVITY W/ RATIO
ALDO / PRA Ratio: <0.4 (ref 0.0–30.0)
Aldosterone: <1 ng/dL (ref 0.0–30.0)
PRA LC/MS/MS: 2.289 ng/mL/hr (ref 0.167–5.380)

## 2017-01-09 ENCOUNTER — Inpatient Hospital Stay (HOSPITAL_COMMUNITY): Payer: Medicare Other

## 2017-01-09 ENCOUNTER — Emergency Department (HOSPITAL_COMMUNITY): Payer: Medicare Other

## 2017-01-09 ENCOUNTER — Encounter (HOSPITAL_COMMUNITY): Payer: Medicare Other

## 2017-01-09 ENCOUNTER — Observation Stay (HOSPITAL_COMMUNITY)
Admission: EM | Admit: 2017-01-09 | Discharge: 2017-01-12 | Disposition: A | Discharge status: Home / Self Care | Payer: Medicare Other | Attending: Internal Medicine | Admitting: Internal Medicine

## 2017-01-09 ENCOUNTER — Encounter (HOSPITAL_COMMUNITY): Payer: Self-pay | Admitting: Emergency Medicine

## 2017-01-09 DIAGNOSIS — N201 Calculus of ureter: Secondary | ICD-10-CM | POA: Insufficient documentation

## 2017-01-09 DIAGNOSIS — I959 Hypotension, unspecified: Secondary | ICD-10-CM | POA: Diagnosis not present

## 2017-01-09 DIAGNOSIS — K625 Hemorrhage of anus and rectum: Secondary | ICD-10-CM | POA: Insufficient documentation

## 2017-01-09 DIAGNOSIS — Z8711 Personal history of peptic ulcer disease: Secondary | ICD-10-CM | POA: Insufficient documentation

## 2017-01-09 DIAGNOSIS — R109 Unspecified abdominal pain: Secondary | ICD-10-CM | POA: Diagnosis not present

## 2017-01-09 DIAGNOSIS — R112 Nausea with vomiting, unspecified: Secondary | ICD-10-CM | POA: Diagnosis not present

## 2017-01-09 DIAGNOSIS — M48061 Spinal stenosis, lumbar region without neurogenic claudication: Secondary | ICD-10-CM | POA: Insufficient documentation

## 2017-01-09 DIAGNOSIS — D500 Iron deficiency anemia secondary to blood loss (chronic): Secondary | ICD-10-CM | POA: Diagnosis present

## 2017-01-09 DIAGNOSIS — F319 Bipolar disorder, unspecified: Secondary | ICD-10-CM | POA: Diagnosis not present

## 2017-01-09 DIAGNOSIS — N179 Acute kidney failure, unspecified: Secondary | ICD-10-CM | POA: Diagnosis not present

## 2017-01-09 DIAGNOSIS — K219 Gastro-esophageal reflux disease without esophagitis: Secondary | ICD-10-CM | POA: Diagnosis present

## 2017-01-09 DIAGNOSIS — K859 Acute pancreatitis without necrosis or infection, unspecified: Secondary | ICD-10-CM | POA: Insufficient documentation

## 2017-01-09 DIAGNOSIS — R197 Diarrhea, unspecified: Secondary | ICD-10-CM | POA: Insufficient documentation

## 2017-01-09 DIAGNOSIS — E279 Disorder of adrenal gland, unspecified: Secondary | ICD-10-CM | POA: Diagnosis not present

## 2017-01-09 DIAGNOSIS — E782 Mixed hyperlipidemia: Secondary | ICD-10-CM | POA: Diagnosis present

## 2017-01-09 DIAGNOSIS — Z880 Allergy status to penicillin: Secondary | ICD-10-CM | POA: Insufficient documentation

## 2017-01-09 DIAGNOSIS — I639 Cerebral infarction, unspecified: Secondary | ICD-10-CM | POA: Diagnosis present

## 2017-01-09 DIAGNOSIS — R0989 Other specified symptoms and signs involving the circulatory and respiratory systems: Secondary | ICD-10-CM

## 2017-01-09 DIAGNOSIS — Z87891 Personal history of nicotine dependence: Secondary | ICD-10-CM | POA: Insufficient documentation

## 2017-01-09 DIAGNOSIS — Z7982 Long term (current) use of aspirin: Secondary | ICD-10-CM | POA: Insufficient documentation

## 2017-01-09 DIAGNOSIS — Z79899 Other long term (current) drug therapy: Secondary | ICD-10-CM | POA: Insufficient documentation

## 2017-01-09 DIAGNOSIS — R918 Other nonspecific abnormal finding of lung field: Secondary | ICD-10-CM | POA: Insufficient documentation

## 2017-01-09 DIAGNOSIS — E785 Hyperlipidemia, unspecified: Secondary | ICD-10-CM | POA: Diagnosis not present

## 2017-01-09 DIAGNOSIS — D62 Acute posthemorrhagic anemia: Secondary | ICD-10-CM | POA: Diagnosis not present

## 2017-01-09 DIAGNOSIS — G8929 Other chronic pain: Secondary | ICD-10-CM | POA: Insufficient documentation

## 2017-01-09 DIAGNOSIS — D649 Anemia, unspecified: Secondary | ICD-10-CM | POA: Diagnosis present

## 2017-01-09 DIAGNOSIS — A419 Sepsis, unspecified organism: Secondary | ICD-10-CM | POA: Diagnosis present

## 2017-01-09 DIAGNOSIS — E278 Other specified disorders of adrenal gland: Secondary | ICD-10-CM

## 2017-01-09 DIAGNOSIS — I701 Atherosclerosis of renal artery: Secondary | ICD-10-CM | POA: Insufficient documentation

## 2017-01-09 DIAGNOSIS — I6930 Unspecified sequelae of cerebral infarction: Secondary | ICD-10-CM | POA: Diagnosis not present

## 2017-01-09 DIAGNOSIS — Z7951 Long term (current) use of inhaled steroids: Secondary | ICD-10-CM | POA: Insufficient documentation

## 2017-01-09 DIAGNOSIS — Z8546 Personal history of malignant neoplasm of prostate: Secondary | ICD-10-CM | POA: Insufficient documentation

## 2017-01-09 DIAGNOSIS — I10 Essential (primary) hypertension: Secondary | ICD-10-CM | POA: Diagnosis not present

## 2017-01-09 DIAGNOSIS — R6510 Systemic inflammatory response syndrome (SIRS) of non-infectious origin without acute organ dysfunction: Secondary | ICD-10-CM | POA: Diagnosis present

## 2017-01-09 DIAGNOSIS — F4310 Post-traumatic stress disorder, unspecified: Secondary | ICD-10-CM | POA: Insufficient documentation

## 2017-01-09 LAB — URINALYSIS, ROUTINE W REFLEX MICROSCOPIC
BILIRUBIN URINE: NEGATIVE
GLUCOSE, UA: NEGATIVE mg/dL
HGB URINE DIPSTICK: NEGATIVE
Ketones, ur: NEGATIVE mg/dL
NITRITE: NEGATIVE
Protein, ur: NEGATIVE mg/dL
RBC / HPF: NONE SEEN RBC/hpf (ref 0–5)
SPECIFIC GRAVITY, URINE: 1.008 (ref 1.005–1.030)
Squamous Epithelial / LPF: NONE SEEN
pH: 5 (ref 5.0–8.0)

## 2017-01-09 LAB — COMPREHENSIVE METABOLIC PANEL
ALT: 19 U/L (ref 17–63)
ANION GAP: 12 (ref 5–15)
AST: 22 U/L (ref 15–41)
Albumin: 3.7 g/dL (ref 3.5–5.0)
Alkaline Phosphatase: 84 U/L (ref 38–126)
BILIRUBIN TOTAL: 0.1 mg/dL — AB (ref 0.3–1.2)
BUN: 31 mg/dL — ABNORMAL HIGH (ref 6–20)
CO2: 22 mmol/L (ref 22–32)
Calcium: 8.3 mg/dL — ABNORMAL LOW (ref 8.9–10.3)
Chloride: 106 mmol/L (ref 101–111)
Creatinine, Ser: 3.39 mg/dL — ABNORMAL HIGH (ref 0.61–1.24)
GFR, EST AFRICAN AMERICAN: 20 mL/min — AB (ref >60)
GFR, EST NON AFRICAN AMERICAN: 17 mL/min — AB (ref >60)
Glucose, Bld: 128 mg/dL — ABNORMAL HIGH (ref 65–99)
POTASSIUM: 3.6 mmol/L (ref 3.5–5.1)
Sodium: 140 mmol/L (ref 135–145)
Total Protein: 6.6 g/dL (ref 6.5–8.1)

## 2017-01-09 LAB — I-STAT CG4 LACTIC ACID, ED
LACTIC ACID, VENOUS: 0.77 mmol/L (ref 0.5–1.9)
Lactic Acid, Venous: 2.13 mmol/L (ref 0.5–1.9)

## 2017-01-09 LAB — CBC
HEMATOCRIT: 35.7 % — AB (ref 39.0–52.0)
HEMOGLOBIN: 11.4 g/dL — AB (ref 13.0–17.0)
MCH: 29.9 pg (ref 26.0–34.0)
MCHC: 31.9 g/dL (ref 30.0–36.0)
MCV: 93.7 fL (ref 78.0–100.0)
Platelets: 319 10*3/uL (ref 150–400)
RBC: 3.81 MIL/uL — ABNORMAL LOW (ref 4.22–5.81)
RDW: 14.2 % (ref 11.5–15.5)
WBC: 16.1 10*3/uL — AB (ref 4.0–10.5)

## 2017-01-09 LAB — C DIFFICILE QUICK SCREEN W PCR REFLEX
C DIFFICILE (CDIFF) INTERP: NOT DETECTED
C DIFFICILE (CDIFF) TOXIN: NEGATIVE
C Diff antigen: NEGATIVE

## 2017-01-09 LAB — LIPASE, BLOOD: Lipase: 580 U/L — ABNORMAL HIGH (ref 11–51)

## 2017-01-09 LAB — ETHANOL: Alcohol, Ethyl (B): <5 mg/dL (ref <5)

## 2017-01-09 MED ORDER — LEVOFLOXACIN IN D5W 500 MG/100ML IV SOLN
500.0000 mg | INTRAVENOUS | Status: DC
Start: 2017-01-10 — End: 2017-01-09

## 2017-01-09 MED ORDER — PANTOPRAZOLE SODIUM 40 MG PO TBEC: 40.0000 mg | DELAYED_RELEASE_TABLET | Freq: Two times a day (BID) | ORAL | Status: DC

## 2017-01-09 MED ORDER — LEVOFLOXACIN IN D5W 750 MG/150ML IV SOLN
750.0000 mg | INTRAVENOUS | Status: DC
  Administered 2017-01-11: 750 mg via INTRAVENOUS
  Filled 2017-01-09: qty 150

## 2017-01-09 MED ORDER — ONDANSETRON HCL 4 MG/2ML IJ SOLN: 4.0000 mg | Freq: Four times a day (QID) | INTRAMUSCULAR | Status: DC | PRN

## 2017-01-09 MED ORDER — FLUTICASONE PROPIONATE 50 MCG/ACT NA SUSP
2.0000 | Freq: Every day | NASAL | Status: DC
  Administered 2017-01-09 – 2017-01-12 (×4): 2 via NASAL
  Filled 2017-01-09 (×2): qty 16

## 2017-01-09 MED ORDER — METRONIDAZOLE IN NACL 5-0.79 MG/ML-% IV SOLN
500.0000 mg | Freq: Once | INTRAVENOUS | Status: AC
  Administered 2017-01-09: 500 mg via INTRAVENOUS
  Filled 2017-01-09: qty 100

## 2017-01-09 MED ORDER — GABAPENTIN 300 MG PO CAPS
300.0000 mg | ORAL_CAPSULE | Freq: Three times a day (TID) | ORAL | Status: DC
  Administered 2017-01-09 – 2017-01-12 (×11): 300 mg via ORAL
  Filled 2017-01-09 (×11): qty 1

## 2017-01-09 MED ORDER — SACCHAROMYCES BOULARDII 250 MG PO CAPS
250.0000 mg | ORAL_CAPSULE | Freq: Two times a day (BID) | ORAL | Status: DC
  Administered 2017-01-09 – 2017-01-12 (×7): 250 mg via ORAL
  Filled 2017-01-09 (×7): qty 1

## 2017-01-09 MED ORDER — FLUOXETINE HCL 20 MG PO CAPS
60.0000 mg | ORAL_CAPSULE | Freq: Every day | ORAL | Status: DC
  Administered 2017-01-09 – 2017-01-11 (×3): 60 mg via ORAL
  Filled 2017-01-09 (×3): qty 3

## 2017-01-09 MED ORDER — LACTATED RINGERS IV BOLUS (SEPSIS)
1000.0000 mL | Freq: Once | INTRAVENOUS | Status: AC
  Administered 2017-01-09: 1000 mL via INTRAVENOUS

## 2017-01-09 MED ORDER — ONDANSETRON HCL 4 MG/2ML IJ SOLN
4.0000 mg | Freq: Once | INTRAMUSCULAR | Status: AC
  Administered 2017-01-09: 4 mg via INTRAVENOUS
  Filled 2017-01-09: qty 2

## 2017-01-09 MED ORDER — LAMOTRIGINE 100 MG PO TABS
100.0000 mg | ORAL_TABLET | Freq: Every day | ORAL | Status: DC
  Administered 2017-01-09 – 2017-01-11 (×3): 100 mg via ORAL
  Filled 2017-01-09 (×3): qty 1

## 2017-01-09 MED ORDER — ASPIRIN EC 325 MG PO TBEC: 325.0000 mg | DELAYED_RELEASE_TABLET | Freq: Every day | ORAL | Status: DC

## 2017-01-09 MED ORDER — SALINE SPRAY 0.65 % NA SOLN
1.0000 | NASAL | Status: DC | PRN
  Filled 2017-01-09: qty 44

## 2017-01-09 MED ORDER — TAMSULOSIN HCL 0.4 MG PO CAPS
0.4000 mg | ORAL_CAPSULE | Freq: Every day | ORAL | Status: DC
  Administered 2017-01-09 – 2017-01-10 (×2): 0.4 mg via ORAL
  Filled 2017-01-09 (×2): qty 1

## 2017-01-09 MED ORDER — ONDANSETRON HCL 4 MG PO TABS: 4.0000 mg | ORAL_TABLET | Freq: Four times a day (QID) | ORAL | Status: DC | PRN

## 2017-01-09 MED ORDER — SODIUM CHLORIDE 0.9% FLUSH
3.0000 mL | Freq: Two times a day (BID) | INTRAVENOUS | Status: DC
  Administered 2017-01-09 – 2017-01-12 (×5): 3 mL via INTRAVENOUS

## 2017-01-09 MED ORDER — SODIUM CHLORIDE 0.9 % IV SOLN
INTRAVENOUS | Status: DC
  Administered 2017-01-09 – 2017-01-11 (×3): via INTRAVENOUS

## 2017-01-09 MED ORDER — HYDROCODONE-ACETAMINOPHEN 5-325 MG PO TABS: 1.0000 | ORAL_TABLET | ORAL | Status: DC | PRN

## 2017-01-09 MED ORDER — MIRTAZAPINE 15 MG PO TABS
45.0000 mg | ORAL_TABLET | Freq: Every day | ORAL | Status: DC
  Administered 2017-01-09 – 2017-01-11 (×3): 45 mg via ORAL
  Filled 2017-01-09 (×3): qty 3

## 2017-01-09 MED ORDER — MORPHINE SULFATE (PF) 4 MG/ML IV SOLN
2.0000 mg | INTRAVENOUS | Status: DC | PRN
  Administered 2017-01-09 – 2017-01-12 (×9): 4 mg via INTRAVENOUS
  Filled 2017-01-09 (×9): qty 1

## 2017-01-09 MED ORDER — PANTOPRAZOLE SODIUM 40 MG IV SOLR
40.0000 mg | Freq: Two times a day (BID) | INTRAVENOUS | Status: DC
  Administered 2017-01-09 – 2017-01-12 (×7): 40 mg via INTRAVENOUS
  Filled 2017-01-09 (×7): qty 40

## 2017-01-09 MED ORDER — BOOST / RESOURCE BREEZE PO LIQD
1.0000 | Freq: Three times a day (TID) | ORAL | Status: DC
  Administered 2017-01-09 – 2017-01-11 (×6): 1 via ORAL

## 2017-01-09 MED ORDER — ENSURE ENLIVE PO LIQD
237.0000 mL | Freq: Two times a day (BID) | ORAL | Status: DC
  Administered 2017-01-10 – 2017-01-12 (×5): 237 mL via ORAL

## 2017-01-09 MED ORDER — IOPAMIDOL (ISOVUE-300) INJECTION 61%
15.0000 mL | Freq: Once | INTRAVENOUS | Status: AC | PRN
  Administered 2017-01-09: 15 mL via ORAL

## 2017-01-09 MED ORDER — LEVOFLOXACIN IN D5W 750 MG/150ML IV SOLN
750.0000 mg | Freq: Once | INTRAVENOUS | Status: AC
  Administered 2017-01-09: 750 mg via INTRAVENOUS
  Filled 2017-01-09: qty 150

## 2017-01-09 MED ORDER — SODIUM CHLORIDE 0.9 % IV BOLUS (SEPSIS)
1000.0000 mL | Freq: Once | INTRAVENOUS | Status: AC
  Administered 2017-01-09: 1000 mL via INTRAVENOUS

## 2017-01-09 MED ORDER — METRONIDAZOLE IN NACL 5-0.79 MG/ML-% IV SOLN
500.0000 mg | Freq: Three times a day (TID) | INTRAVENOUS | Status: DC
  Administered 2017-01-09 – 2017-01-11 (×6): 500 mg via INTRAVENOUS
  Filled 2017-01-09 (×7): qty 100

## 2017-01-09 MED ORDER — FERROUS SULFATE 325 (65 FE) MG PO TABS
325.0000 mg | ORAL_TABLET | Freq: Two times a day (BID) | ORAL | Status: DC
  Administered 2017-01-09 – 2017-01-12 (×7): 325 mg via ORAL
  Filled 2017-01-09 (×7): qty 1

## 2017-01-09 MED ORDER — FENTANYL CITRATE (PF) 100 MCG/2ML IJ SOLN
50.0000 ug | Freq: Once | INTRAMUSCULAR | Status: AC
  Administered 2017-01-09: 50 ug via INTRAVENOUS
  Filled 2017-01-09: qty 2

## 2017-01-09 MED ORDER — OXYCODONE HCL 5 MG PO TABS
10.0000 mg | ORAL_TABLET | Freq: Three times a day (TID) | ORAL | Status: DC | PRN
Start: 2017-01-09 — End: 2017-01-10
  Administered 2017-01-10: 10 mg via ORAL
  Filled 2017-01-09: qty 2

## 2017-01-09 MED ORDER — SODIUM CHLORIDE 0.9 % IV BOLUS (SEPSIS)
500.0000 mL | Freq: Once | INTRAVENOUS | Status: AC
  Administered 2017-01-09: 500 mL via INTRAVENOUS

## 2017-01-09 NOTE — H&P (Addendum)
[facility] History and Physical   Patient: DAMARCO KEYSOR ZDG:[id number]   PCP: System, Pcp Not In DOB: Apr 23, 1946   DOA: 01/09/2017   DOS: 01/09/2017   DOS: the patient was seen and examined on 01/09/2017  Patient coming from: The patient is coming from home.  Chief Complaint: Abdominal pain  HPI: XXAVIER NOON is a 71 y.o. male with Past medical history of GERD, bipolar disorder, chronic back pain, depression, dyslipidemia, lung nodule, PTSD with nightmares, CVA with residual deficit. Patient presented with complaints of abdominal pain. Patient was recently hospitalized for the same on 12/24/2016, had acute kidney injury, had some free air in the abdomen which was thought to be perforated peptic ulcer, diverticulum, Gen. surgery was consulted and conservative management was chosen. [facility]. Had acute kidney injury and nephrology was consulted with improvement in renal function on discharge. After the discharge on 12/28/2016, patient developed recurrence of nausea and vomiting 2 days ago. He had some abdominal pain at the time of the discharge which worsened yesterday as well. Patient actually saw his PCP and his amlodipine dose was increased due to hypertension. Today he was feeling weak and lethargic and his wife as well as EMS found that he was hypotensive. He also had body aches, chills as well as nausea with vomiting. He complains about diarrhea ongoing since recent discharge and mentions that he has seen blood, bright red on his wipes. He has poor appetite and has not been eating or drinking. He mentions that he is compliant with all his medical regimen, unable to see his GI doctor. Today he was so weak that EMS was called by wife as he was also hypotensive and blood pressure was in 70/40s with EMS. Wife mentions that his blood pressure has been significantly labile at home and it will rise to 200s and then drop down to low. He also had recurrent  episodes of hot flashes followed by cold spells and this has been ongoing for almost a year. Denies having headache, facial flushing, dizziness or lightheadedness. No chest pain or cough. No blood in the urine or burning urination.  ED Course: Patient was given IV hydration, lipase was found elevated. CT scan performed, no evidence of free air, bilateral perinephric stranding, ureteral stone 5 mm in size and acute kidney injury. Patient was referred for admission after starting IV antibiotics.  At his baseline ambulates with support And is independent for most of his ADL; manages his medication on his own.  Review of Systems: as mentioned in the history of present illness.  All other systems reviewed and are negative.  Past Medical History:  Diagnosis Date  . Acid reflux disease with ulcer   . AKI (acute kidney injury) ([place])   . Anemia   . Arthritis   . Bipolar 1 disorder ([place])   . Carotid stenosis   . Chronic back pain   . Complication of anesthesia 2000   reaction to anesthesia had some swelling   . Depression   . GERD (gastroesophageal reflux disease)   . Hearing loss   . Hyperlipidemia   . Hypertension   . Prostate cancer [facility])    prostate cancer  . PTSD (post-traumatic stress disorder)   . Stroke ([place]) 08/2015  . TIA (transient ischemic attack)    has had several mini strokes   Past Surgical History:  Procedure Laterality Date  . BACK SURGERY    . CARDIAC CATHETERIZATION     cant remember when  .  COLONOSCOPY N/A 10/16/2015   Procedure: COLONOSCOPY;  Surgeon: [name], MD;  Location: [facility] ENDOSCOPY;  Service: Gastroenterology;  Laterality: N/A;  . ESOPHAGOGASTRODUODENOSCOPY N/A 10/15/2015   Procedure: ESOPHAGOGASTRODUODENOSCOPY (EGD);  Surgeon: [name], MD;  Location: [place];  Service: Gastroenterology;  Laterality: N/A;  . EYE SURGERY     cataracts  . GIVENS CAPSULE STUDY N/A 10/16/2015   Procedure: GIVENS CAPSULE STUDY;  Surgeon: [name], MD;  Location: [place];  Service: Gastroenterology;  Laterality: N/A;  . PROSTATE SURGERY     Social History:  reports that he quit smoking about 13 years ago. He has never used smokeless tobacco. He reports that he does not drink alcohol or use drugs.  Allergies  Allergen Reactions  . Penicillins Other (See Comments)    Reaction:  Unknown  Has patient had a PCN reaction causing immediate rash, facial/tongue/throat swelling, SOB or lightheadedness with hypotension: Unknown Has patient had a PCN reaction causing severe rash involving mucus membranes or skin necrosis: Unknown Has patient had a PCN reaction that required hospitalization: Unknown Has patient had a PCN reaction occurring within the last 10 years: No If all of the above answers are "NO", then may proceed with Cephalosporin use.    Family History  Problem Relation Age of Onset  . Stroke Mother        Hemorrhagic stroke  . Cancer Father        Pancreatic cancer  . Hypertension Father      Prior to Admission medications   Medication Sig Start Date End Date Taking? Authorizing Provider  amLODipine (NORVASC) 5 MG tablet Take 1 tablet (5 mg total) by mouth daily. Patient taking differently: Take 10 mg by mouth daily.  12/28/16  Yes Rai, Ripudeep K, MD  aspirin EC 325 MG tablet Take 325 mg by mouth at bedtime.   Yes [provider]  atorvastatin (LIPITOR) 40 MG tablet Take 1 tablet (40 mg total) by mouth daily at 6 PM. Patient taking differently: Take 40 mg by mouth at bedtime.  04/14/14  Yes Mikhail, [place], DO  calcium carbonate (OSCAL) 1500 (600 Ca) MG TABS tablet Take 1,200 mg of elemental calcium by mouth at bedtime.   Yes [provider]  Carboxymethylcellulose Sodium (THERATEARS OP) Place 1 drop into both eyes 4 (four) times daily as needed (for dry eyes).    Yes [provider]  cefUROXime (CEFTIN) 500 MG tablet Take 1 tablet (500 mg total) by mouth 2 (two) times daily  with a meal. X 10 days 12/28/16  Yes Rai, Ripudeep K, MD  ferrous sulfate 325 (65 FE) MG tablet Take 1 tablet (325 mg total) by mouth 2 (two) times daily with a meal. Patient taking differently: Take 650 mg by mouth at bedtime.  10/17/15  Yes Robbie Lis, MD  FLUoxetine (PROZAC) 20 MG capsule Take 60 mg by mouth at bedtime.    Yes [provider]  gabapentin (NEURONTIN) 600 MG tablet Take 1,200 mg by mouth at bedtime.   Yes [provider]  Glucosamine Sulfate 1000 MG CAPS Take 2,000 mg by mouth at bedtime.    Yes [provider]  lamoTRIgine (LAMICTAL) 25 MG tablet Take 100 mg by mouth at bedtime.    Yes [provider]  metroNIDAZOLE (FLAGYL) 500 MG tablet Take 1 tablet (500 mg total) by mouth 3 (three) times daily. X 10 days 12/28/16  Yes Rai, Ripudeep K, MD  mirtazapine (REMERON) 45 MG tablet Take  45 mg by mouth at bedtime.   Yes [provider]  omega-3 acid ethyl esters (LOVAZA) 1 g capsule Take 2 g by mouth at bedtime.   Yes [provider]  Oxycodone HCl 10 MG TABS Take 10 mg by mouth 3 (three) times daily as needed (for pain).   Yes [provider]  oxymetazoline (AFRIN) 0.05 % nasal spray Place 1-2 sprays into both nostrils 2 (two) times daily as needed for congestion.   Yes [provider]  pantoprazole (PROTONIX) 40 MG tablet Take 1 tablet (40 mg total) by mouth 2 (two) times daily before a meal. 12/28/16  Yes Rai, Ripudeep K, MD  prazosin (MINIPRESS) 1 MG capsule Take 3 mg by mouth at bedtime.   Yes [provider]  promethazine (PHENERGAN) 12.5 MG tablet Take 1 tablet (12.5 mg total) by mouth every 6 (six) hours as needed for nausea or vomiting. 12/28/16  Yes Rai, Vernelle Emerald, MD    Physical Exam: Vitals:   01/09/17 0933 01/09/17 1003 01/09/17 1006 01/09/17 1506  BP:   110/62   Pulse:   88   Resp:   18 20  Temp: 97.8 F (36.6 C)  97.8 F (36.6 C) 97.9 F (36.6 C)  TempSrc: Oral  Oral Oral  SpO2:    98%   Weight:  79.3 kg (174 lb 13.2 oz)    Height:  5\' 4"  (1.626 m)      General: Alert, Awake and Oriented to Time, Place and Person. Appear in marked distress, affect appropriate Eyes: PERRL, Conjunctiva normal ENT: Oral Mucosa clear moist. Neck: no JVD, no Abnormal Mass Or lumps Cardiovascular: S1 and S2 Present, no Murmur, Peripheral Pulses Present Respiratory: normal respiratory effort, Bilateral Air entry equal and Decreased, no use of accessory muscle, Clear to Auscultation, no Crackles, no wheezes Abdomen: Bowel Sound present, Soft and mild diffuse tenderness, no hernia Skin: no redness, no Rash, no induration Extremities: trace Pedal edema, no calf tenderness Neurologic: Grossly no focal neuro deficit. Bilaterally Equal motor strength  Labs on Admission:  CBC:  Recent Labs Lab 01/09/17 0326  WBC 16.1*  HGB 11.4*  HCT 35.7*  MCV 93.7  PLT 867   Basic Metabolic Panel:  Recent Labs Lab 01/09/17 0326  NA 140  K 3.6  CL 106  CO2 22  GLUCOSE 128*  BUN 31*  CREATININE 3.39*  CALCIUM 8.3*   GFR: Estimated Creatinine Clearance: 19.5 mL/min (A) (by C-G formula based on SCr of 3.39 mg/dL (H)). Liver Function Tests:  Recent Labs Lab 01/09/17 0326  AST 22  ALT 19  ALKPHOS 84  BILITOT 0.1*  PROT 6.6  ALBUMIN 3.7    Recent Labs Lab 01/09/17 0326  LIPASE 580*   No results for input(s): AMMONIA in the last 168 hours. Coagulation Profile: No results for input(s): INR, PROTIME in the last 168 hours. Cardiac Enzymes: No results for input(s): CKTOTAL, CKMB, CKMBINDEX, TROPONINI in the last 168 hours. BNP (last 3 results) No results for input(s): PROBNP in the last 8760 hours. HbA1C: No results for input(s): HGBA1C in the last 72 hours. CBG: No results for input(s): GLUCAP in the last 168 hours. Lipid Profile: No results for input(s): CHOL, HDL, LDLCALC, TRIG, CHOLHDL, LDLDIRECT in the last 72 hours. Thyroid Function Tests: No results for input(s): TSH,  T4TOTAL, FREET4, T3FREE, THYROIDAB in the last 72 hours. Anemia Panel: No results for input(s): VITAMINB12, FOLATE, FERRITIN, TIBC, IRON, RETICCTPCT in the last 72 hours. Urine analysis:  Component Value Date/Time   COLORURINE YELLOW 01/09/2017 0741   APPEARANCEUR CLEAR 01/09/2017 0741   LABSPEC 1.008 01/09/2017 0741   PHURINE 5.0 01/09/2017 0741   GLUCOSEU NEGATIVE 01/09/2017 0741   HGBUR NEGATIVE 01/09/2017 0741   BILIRUBINUR NEGATIVE 01/09/2017 0741   KETONESUR NEGATIVE 01/09/2017 0741   PROTEINUR NEGATIVE 01/09/2017 0741   UROBILINOGEN 0.2 05/09/2014 2115   NITRITE NEGATIVE 01/09/2017 0741   LEUKOCYTESUR TRACE (A) 01/09/2017 0741    Radiological Exams on Admission: Ct Abdomen Pelvis Wo Contrast  Result Date: 01/09/2017 CLINICAL DATA:  Acute onset of generalized abdominal pain, nausea and diarrhea. Initial encounter. EXAM: CT ABDOMEN AND PELVIS WITHOUT CONTRAST TECHNIQUE: Multidetector CT imaging of the abdomen and pelvis was performed following the standard protocol without IV contrast. COMPARISON:  CT of the abdomen and pelvis performed 12/25/2016, and renal ultrasound performed 12/24/2016. MRI of the lumbar spine performed 05/25/2014 FINDINGS: Lower chest: The visualized lung bases are grossly clear. The visualized portions of the mediastinum are unremarkable. Hepatobiliary: The liver is unremarkable in appearance. The gallbladder is unremarkable in appearance. The common bile duct remains normal in caliber. Pancreas: The pancreas is within normal limits. Spleen: The spleen is unremarkable in appearance. Adrenals/Urinary Tract: A 1.7 cm nodule is noted at the left adrenal gland. The right adrenal gland is unremarkable in appearance. Nonspecific perinephric stranding is noted bilaterally. There is a 5 mm stone at the proximal left ureter, just below the left renal pelvis, without significant hydronephrosis. This likely causes intermittent obstruction. No nonobstructing renal stones are  identified. Stomach/Bowel: The stomach is unremarkable in appearance. The small bowel is within normal limits. The appendix is normal in caliber, without evidence of appendicitis. The colon is unremarkable in appearance. Scattered diverticulosis is noted along the entirety of the colon, without evidence of diverticulitis. Vascular/Lymphatic: Scattered calcification is seen along the abdominal aorta and its branches. The abdominal aorta is otherwise grossly unremarkable. The inferior vena cava is grossly unremarkable. No retroperitoneal lymphadenopathy is seen. No pelvic sidewall lymphadenopathy is identified. Reproductive: The bladder is mildly distended and grossly unremarkable. The patient is status post prostatectomy. Other: No additional soft tissue abnormalities are seen. Musculoskeletal: No acute osseous abnormalities are identified. Vacuum phenomenon is noted at L4-L5, with underlying facet disease along the lumbar spine. The visualized musculature is unremarkable in appearance. IMPRESSION: 1. 5 mm stone noted at the proximal left ureter, just below the left renal pelvis, without significant hydronephrosis. This likely causes intermittent obstruction. 2. Scattered diverticulosis along the entirety of the colon, without evidence of diverticulitis. 3. Scattered aortic atherosclerosis. 4. 1.7 cm nonspecific nodule at the left adrenal gland. Would correlate with adrenal labs, and consider adrenal protocol MRI or CT for further evaluation. Electronically Signed   By: Garald Balding M.D.   On: 01/09/2017 06:13   Dg Abd 1 View  Result Date: 01/09/2017 CLINICAL DATA:  Left ureteral stone. EXAM: ABDOMEN - 1 VIEW COMPARISON:  CT scan earlier today. FINDINGS: The patient's known at 5 mm proximal left ureteral stone is not visible by x-ray. Bowel gas pattern is nonspecific. Surgical clips are noted in the central anatomic pelvis. IMPRESSION: Tiny proximal left ureteral stone not evident by x-ray. Electronically Signed    By: Misty Stanley M.D.   On: 01/09/2017 13:34   EKG: Independently reviewed. normal sinus rhythm, nonspecific ST and T waves changes.  Assessment/Plan 1. Sepsis ([place]) versus SIRS. Has leukocytosis, lactic acidosis, acute kidney injury, hypotension, low temperature on admission. There is a concern that the patient  has reoccurrence of his recent infection versus UTI. UA currently shows no evidence of pyuria. CT abdomen pelvis shows 5 mm proximal ureteral stone without any obstruction or hydronephrosis, but shows bilateral perinephric stranding concerning for pyelonephritis. No evidence of diverticulitis or free air(which was evident on prior CT scan) With this the patient is currently receiving IV Levaquin and Flagyl. Blood cultures have been performed. Patient has penicillin allergy. Continue antibiotic and monitor on telemetry. With diarrhea I would rule out C. difficile as well as get GI pathogen panel.  2. Recurrent nausea and vomiting with acute kidney injury. History of GERD as well as peptic ulcer. BRBPR  Etiology is currently unclear although based on the documentation seems like his symptoms resolved totally on last admission. Patient was referred to GI as an outpatient at [facility]. CT abdomen without any acute GI track abnormality. BRBPR on his wipes actually appears to be trauma from wiping rather than acute GI bleed. History of GERD with concern for her because her disease for which the patient is scheduled to follow-up with GI as an outpatient at [facility]. At present I would treat the patient with IV Protonix every 12 hours, monitor serial H&H continue iron supplementation. If there is a significant drop in hemoglobin patient will require GI consult but currently holding it. Also holding aspirin and placing the patient on mechanical DVT prophylaxis.  3. Episodic labile hypertension followed by hypotension. Episodic hot flashes followed by cold spells. 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 Hounsfield unit measurement of 21  By the presentation does not meet any of the classic criteria, constellation of the symptoms and with her current presentation I will get further workup of this adrenal nodule with MRI abdomen, also checking 24-hour urine catecholamines and metanephrines along with HIAA. Aldosterone renin activity ratio last admission was performed which was unremarkable. Also check ultrasound renal Doppler. Unclear whether patient's use of Afrin spray at home is contributing to this.  4. Acute kidney injury. Likely prerenal etiology with poor oral intake. No evidence of obstruction. Patient was seen by nephrologist last admission. It was felt that his acute kidney injury was hemodynamically mediated. Patient was given aggressive hydration with significant improvement in renal function as well as GFR before discharge. Outpatient follow-up recommended fine 4-6 weeks. At present without any obstruction or any other acute etiology currently we will hold off on nephrology consult and hydrate the patient aggressively.  5. Acute pancreatitis. Lipase 580.  CT negative for any evidence of pancreatitis. So far has received 2.5 L normal saline bolus. We will continue aggressive IV hydration. Pain is not significant and therefore I feel comfortable to continue clear liquid diet. Daily lipase at present. CT scan without any evidence of abnormal gallbladder or abnormal CBD. No alcohol abuse reported by patient.  6. 5 mm ureteral stone. Urology consulted, appreciate input. Flomax added. Conservative management at present, with eventual procedure for stone removal.  7. Pulmonary nodule. Patient has 2 nodules in right lung apex. Recommendation last admission was 2 get a repeat CT scan in 3-6 months(from June 2018), if no worsening recommendation was to repeat another CAT scan in 18-24 months(from June 2018)  8. PTSD with nightmares. Patient actually uses prazosin for  nightmares. Recommend currently to hold it and monitor.  9. Spinal stenosis. Foramen narrowing and L4-L5 and L5-S1 level. Patient scheduled for an outpatient surgery at [facility] sometime at the end of this month for this, laminectomy.  10. Fatigue and tiredness. Mild acute encephalopathy. History  of bipolar disorder Currently I am continuing his Prozac, reducing his gabapentin from 1200 mg at bedtime with 300 mg 3 times a day, continuing Lamictal. Continuing Remeron.  Nutrition: Clear liquid diet DVT Prophylaxis: mechanical compression device  Advance goals of care discussion: full code   Consults: Urology  Family Communication: family was present at bedside, at the time of interview.  Opportunity was given to ask question and all questions were answered satisfactorily.  Disposition: Admitted as inpatient, telemetry unit. Likely to be discharged home, in 3-4 days.  Author: Berle Mull, MD [facility] Pager: [phone] 01/09/2017  If 7PM-7AM, please contact night-coverage [email or web address] Password [email or web address]

## 2017-01-09 NOTE — ED Notes (Signed)
Bed: WA12 Expected date:  Expected time:  Means of arrival:  Comments: 

## 2017-01-09 NOTE — ED Triage Notes (Signed)
Brought in by EMS from home with c/o generalized abdominal pain with nausea and diarrhea, no vomiting.  Pt was recently discharged from hospitalization for sepsis--- discharged home on oral antibiotics.  Pt reports that since taking antibiotic, he has been having abdominal discomfort with diarrhea.  Pt reported that he noted "blood-tinged stool" yesterday but not today.  Pt was hypotensive at BP 70/42 by EMS on scene----- pt was given NS 500 ml bolus on scene and Zofran 4 mg IV.

## 2017-01-09 NOTE — Consult Note (Signed)
Urology Consult  Consulting MD: Dr Marlowe Sax  CC: Kidney stone  HPI: This is a 71 year old male readmitted for management of nausea, vomiting, and chronic kidney disease, with recent findings during his first admission of probable perforated colonic diverticulum.  The patient is on antibiotics.  Over the past few days he has had persistent nausea, vomiting and hypotension.  CT scan at his first admission in early June, 2017 revealed a 4 millimeter left proximal ureteral stone.  There was no associated hydronephrosis.  Repeat CT scan revealed persistent stone in the same region without hydronephrosis.  The patient has experienced no lateralizing flank pain, no left lower quadrant pain, but has had back pain, abdominal pain of a generalized nature as well as intermittent nausea and vomiting.  He has not had any gross hematuria.  Urinalysis from his June admission revealed microscopic hematuria.  Repeat urinalysis revealed no hematuria or sign of urinary tract infection.  The patient's creatinine on 12/27/2016 was 1.22.  Today, it is 3.39.  PMH: Past Medical History:  Diagnosis Date  . Acid reflux disease with ulcer   . AKI (acute kidney injury) ([place])   . Anemia   . Arthritis   . Bipolar 1 disorder ([place])   . Carotid stenosis   . Chronic back pain   . Complication of anesthesia 2000   reaction to anesthesia had some swelling   . Depression   . GERD (gastroesophageal reflux disease)   . Hearing loss   . Hyperlipidemia   . Hypertension   . Prostate cancer [facility])    prostate cancer  . PTSD (post-traumatic stress disorder)   . Stroke ([place]) 08/2015  . TIA (transient ischemic attack)    has had several mini strokes    PSH: Past Surgical History:  Procedure Laterality Date  . BACK SURGERY    . CARDIAC CATHETERIZATION     cant remember when  . COLONOSCOPY N/A 10/16/2015   Procedure: COLONOSCOPY;  Surgeon: [name], MD;  Location: [facility] ENDOSCOPY;  Service: Gastroenterology;   Laterality: N/A;  . ESOPHAGOGASTRODUODENOSCOPY N/A 10/15/2015   Procedure: ESOPHAGOGASTRODUODENOSCOPY (EGD);  Surgeon: [name], MD;  Location: [place];  Service: Gastroenterology;  Laterality: N/A;  . EYE SURGERY     cataracts  . GIVENS CAPSULE STUDY N/A 10/16/2015   Procedure: GIVENS CAPSULE STUDY;  Surgeon: [name], MD;  Location: [place];  Service: Gastroenterology;  Laterality: N/A;  . PROSTATE SURGERY      Allergies: Allergies  Allergen Reactions  . Penicillins Other (See Comments)    Reaction:  Unknown  Has patient had a PCN reaction causing immediate rash, facial/tongue/throat swelling, SOB or lightheadedness with hypotension: Unknown Has patient had a PCN reaction causing severe rash involving mucus membranes or skin necrosis: Unknown Has patient had a PCN reaction that required hospitalization: Unknown Has patient had a PCN reaction occurring within the last 10 years: No If all of the above answers are "NO", then may proceed with Cephalosporin use.    Medications: Prescriptions Prior to Admission  Medication Sig Dispense Refill Last Dose  . amLODipine (NORVASC) 5 MG tablet Take 1 tablet (5 mg total) by mouth daily. (Patient taking differently: Take 10 mg by mouth daily. ) 30 tablet 3 01/08/2017 at Unknown time  . aspirin EC 325 MG tablet Take 325 mg by mouth at bedtime.   01/08/2017 at 2000  . atorvastatin (LIPITOR) 40 MG tablet Take 1 tablet (40 mg total) by mouth daily at 6  PM. (Patient taking differently: Take 40 mg by mouth at bedtime. ) 30 tablet 0 01/08/2017 at Unknown time  . calcium carbonate (OSCAL) 1500 (600 Ca) MG TABS tablet Take 1,200 mg of elemental calcium by mouth at bedtime.   01/08/2017 at Unknown time  . Carboxymethylcellulose Sodium (THERATEARS OP) Place 1 drop into both eyes 4 (four) times daily as needed (for dry eyes).    Past Week at Unknown time  . cefUROXime (CEFTIN) 500 MG tablet Take 1 tablet (500 mg total) by mouth 2  (two) times daily with a meal. X 10 days 20 tablet 0 01/08/2017 at Unknown time  . ferrous sulfate 325 (65 FE) MG tablet Take 1 tablet (325 mg total) by mouth 2 (two) times daily with a meal. (Patient taking differently: Take 650 mg by mouth at bedtime. ) 60 tablet 0 01/08/2017 at Unknown time  . FLUoxetine (PROZAC) 20 MG capsule Take 60 mg by mouth at bedtime.    01/08/2017 at Unknown time  . gabapentin (NEURONTIN) 600 MG tablet Take 1,200 mg by mouth at bedtime.   01/08/2017 at Unknown time  . Glucosamine Sulfate 1000 MG CAPS Take 2,000 mg by mouth at bedtime.    01/08/2017 at Unknown time  . lamoTRIgine (LAMICTAL) 25 MG tablet Take 100 mg by mouth at bedtime.    01/08/2017 at Unknown time  . metroNIDAZOLE (FLAGYL) 500 MG tablet Take 1 tablet (500 mg total) by mouth 3 (three) times daily. X 10 days 30 tablet 0 01/08/2017 at Unknown time  . mirtazapine (REMERON) 45 MG tablet Take 45 mg by mouth at bedtime.   01/08/2017 at Unknown time  . omega-3 acid ethyl esters (LOVAZA) 1 g capsule Take 2 g by mouth at bedtime.   01/08/2017 at Unknown time  . Oxycodone HCl 10 MG TABS Take 10 mg by mouth 3 (three) times daily as needed (for pain).   01/08/2017 at 0800  . oxymetazoline (AFRIN) 0.05 % nasal spray Place 1-2 sprays into both nostrils 2 (two) times daily as needed for congestion.   Past Week at Unknown time  . pantoprazole (PROTONIX) 40 MG tablet Take 1 tablet (40 mg total) by mouth 2 (two) times daily before a meal. 60 tablet 3 01/08/2017 at Unknown time  . prazosin (MINIPRESS) 1 MG capsule Take 3 mg by mouth at bedtime.   01/08/2017 at Unknown time  . promethazine (PHENERGAN) 12.5 MG tablet Take 1 tablet (12.5 mg total) by mouth every 6 (six) hours as needed for nausea or vomiting. 30 tablet 0 01/08/2017 at Unknown time     Social History: Social History   Social History  . Marital status: Married    Spouse name: N/A  . Number of children: N/A  . Years of education: N/A   Occupational History  . Not on file.    Social History Main Topics  . Smoking status: Former Smoker    Quit date: 03/06/2003  . Smokeless tobacco: Never Used  . Alcohol use No     Comment: drank years ago  . Drug use: No  . Sexual activity: No   Other Topics Concern  . Not on file   Social History Narrative  . No narrative on file    Family History: Family History  Problem Relation Age of Onset  . Stroke Mother        Hemorrhagic stroke  . Cancer Father        Pancreatic cancer  . Hypertension Father  Review of Systems: Positive: Chronic back pain, nausea, vomiting, generalized abdominal pain, chills Negative:   A further 10 point review of systems was negative except what is listed in the HPI.  Physical Exam: @VITALS2 @ General: No acute distress.  Awake. Head:  Normocephalic.  Atraumatic. ENT:  EOMI.  Mucous membranes moist Neck:  Supple.  No lymphadenopathy. CV:   Regular rate. Pulmonary: Equal effort bilaterally.   Abdomen: Soft.  Non- tender to palpation.  No specific CVA tenderness Skin:  Normal turgor.  No visible rash. Extremity: No gross deformity of bilateral upper extremities.  No gross deformity of    bilateral lower extremities. Neurologic: Alert. Appropriate mood.    Studies:  Recent Labs     01/09/17  0326  HGB  11.4*  WBC  16.1*  PLT  319    Recent Labs     01/09/17  0326  NA  140  K  3.6  CL  106  CO2  22  BUN  31*  CREATININE  3.39*  CALCIUM  8.3*  GFRNONAA  17*  GFRAA  20*     No results for input(s): INR, APTT in the last 72 hours.  Invalid input(s): PT   Invalid input(s): ABG  I have independently  reviewed the patient's CT images from June and those recently performed.  Other clinical data was all reviewed.  Left proximal ureteral stone--Hounsfield units 550, skin to stone distance 14 centimeters  Assessment:  1.  Left proximal ureteral stone.  I don't think that this is significantly symptomatic at this time.  The patient does have an elevated  creatinine, but there is no evidence of hydronephrosis on the most recent CT scan.  There has been nonprogression of the stone, so I think eventually this needs to be treated.  2.  Acute renal insufficiency, recurrent.  I don't think that this is due to any obstructive process  Plan: 1.  I have spoken with the patient and his wife about the need, eventually, to treat the stone.  As I think it's currently significantly symptomatic, this can be done on a nonurgent basis.  We talked about treatment options including shockwave lithotripsy versus ureteroscopic management with lithotripsy using a laser and extraction.  I would prefer the former of these, as it does not necessarily require an anesthetic.  If the stone can be seen on a scout film (ordered today), this is the best option, I think  2.  I will follow this gentleman.  I would suggest nephrology consult if not already requested    Pager:[phone]

## 2017-01-09 NOTE — Progress Notes (Signed)
Pharmacy Antibiotic Note  Jason [name] is a 71 y.o. male admitted on 01/09/2017 with Intra-abdominal infection.  Pharmacy has been consulted for Flagyl, levofloxacin dosing.  Plan: Flagyl 500mg  iv q8hr Levofloxacin 750mg  iv x1, then 500mg  iv q24hr     Temp (24hrs), Avg:97.4 F (36.3 C), Min:97.4 F (36.3 C), Max:97.4 F (36.3 C)   Recent Labs Lab 01/09/17 0326 01/09/17 0344  WBC 16.1*  --   LATICACIDVEN  --  2.13*    Estimated Creatinine Clearance: 60.5 mL/min (by C-G formula based on SCr of 1.16 mg/dL).    Allergies  Allergen Reactions  . Penicillins Other (See Comments)    Reaction:  Unknown  Has patient had a PCN reaction causing immediate rash, facial/tongue/throat swelling, SOB or lightheadedness with hypotension: Unknown Has patient had a PCN reaction causing severe rash involving mucus membranes or skin necrosis: Unknown Has patient had a PCN reaction that required hospitalization: Unknown Has patient had a PCN reaction occurring within the last 10 years: No If all of the above answers are "NO", then may proceed with Cephalosporin use.    Antimicrobials this admission: Flagyl 7/8 >> Levofloxacin 7/8 >>  Dose adjustments this admission: -  Microbiology results: pending  Thank you for allowing pharmacy to be a part of this patient's care.  Nani Skillern Crowford 01/09/2017 4:03 AM

## 2017-01-09 NOTE — ED Notes (Signed)
ADMITTING Provider at bedside. 

## 2017-01-09 NOTE — ED Notes (Signed)
Pt unable to give urine sample, but aware that we need one.  

## 2017-01-09 NOTE — Progress Notes (Addendum)
Pharmacy: Re- levaquin  Patient's a 71 y.o M presented to the ED on 7/8 with c/o abd pain.  levaquin and flagyl started on admission for suspected intra-abd infection/UTI.  - 7/8 abd CT: kidney stone without significant hydronephrosis. This likely causes intermittent obstruction; Scattered diverticulosis along the entirety of the colon, without evidence of diverticulitis - afeb, wbc 16.1 -  scr 3.39 (crcl~19)-- scr was 1.16 on 6/26, LA down    Plan: - will adjust levaquin dose to 750 mg IV q48h for indication and renal function - f/u renal function - continue flagyl 500 mg IV q8h. Pharmacy will sign off for flagyl since no renal adj is needed for this abx - daily scr for the next three days  Dia Sitter, PharmD, BCPS 01/09/2017 11:31 AM

## 2017-01-09 NOTE — ED Notes (Addendum)
Unable to get urine sample because pt just went to the bathroom.

## 2017-01-09 NOTE — Progress Notes (Signed)
VASCULAR LAB    Renal artery duplex done 12/27/16.  Please see results below.   Result status: Final result                       *[facility]*                         [address] [place], [place] [address]                            [phone]  ------------------------------------------------------------------- Noninvasive Vascular Lab  Renal Artery Duplex Evaluation  Patient:    Jason [name], [place] MR #:       [id number] Study Date: 12/27/2016 Gender:     M Age:        71 Height:     162.6 cm Weight:     82.5 kg BSA:        1.96 m^2 Pt. Status: Room:       5W19C   ATTENDING    Rai, Ripudeep K  SONOGRAPHER  Maudry Mayhew, RVT, RDCS, RDMS  ADMITTING    Danford, Amberley P  ORDERING     Queen Blossom, Benjamine Mola  Reports also to:  ------------------------------------------------------------------- History and indications:  History  Diagnostic evaluation. Hypertension.  ------------------------------------------------------------------- Study information:  Study status:  Routine.  Procedure:  A vascular evaluation was performed with the patient in the supine position.    Renal artery duplex evaluation.     Renal artery duplex evaluation following standard established protocol. Color and pulsed wave Doppler interrogation of intrarenal and extrarenal vasculature.Curt Jews:  Patient birthdate: 1945/08/20.  Age:  Patient is 71 yr old.  Sex:  Gender: male.  Height:  Height: 162.6 cm. Height: 64 in.  Weight:  Weight: 82.5 kg. Weight: 181.5 lb.  Body mass index: BMI: 31.2 kg/m^2.  Body surface area:    BSA: 1.96 m^2.  Study date:  Study date: 12/27/2016. Study time: 09:35 AM.  Patient status:  Inpatient.  Arterial flow:  +-------------------+--------+--------+---------+-------+---------+ !Location           !V sys   !V ed     !Dimension!Comment!Resistive! +-------------------+--------+--------+---------+-------+---------+ !Suprarenal aorta   !102 cm/s!20 cm/s !---------!-------!---------! +-------------------+--------+--------+---------+-------+---------+ !Celiac - proximal  !139 cm/s!27 cm/s !---------!-------!---------! +-------------------+--------+--------+---------+-------+---------+ !Superior mesenteric!188 cm/s!21 cm/s !---------!-------!---------! !- proximal         !        !        !         !       !         ! +-------------------+--------+--------+---------+-------+---------+ !Right renal -      !-63 cm/s!-12 cm/s!---------!-------!---------! !proximal           !        !        !         !       !         ! +-------------------+--------+--------+---------+-------+---------+ !Right renal - mid  !72 cm/s !11 cm/s !---------!-------!---------! +-------------------+--------+--------+---------+-------+---------+ !Right renal -      !56 cm/s !12 cm/s !---------!-------!---------! !  distal             !        !        !         !       !         ! +-------------------+--------+--------+---------+-------+---------+ !Right renal - Hilum!78 cm/s !17 cm/s !---------!-------!---------! +-------------------+--------+--------+---------+-------+---------+ !Right renal -      !60 cm/s !12 cm/s !---------!-------!---------! !Origin             !        !        !         !       !         ! +-------------------+--------+--------+---------+-------+---------+ !Accessory right    !84 cm/s !13 cm/s !---------!-------!---------! !renal - proximal   !        !        !         !       !         ! +-------------------+--------+--------+---------+-------+---------+ !Accessory right    !72 cm/s !16 cm/s !---------!-------!---------! !renal - mid        !        !        !         !       !         ! +-------------------+--------+--------+---------+-------+---------+ !Accessory right    !65 cm/s !6 cm/s   !---------!-------!---------! !renal - distal     !        !        !         !       !         ! +-------------------+--------+--------+---------+-------+---------+ !Accessory right    !47 cm/s !18 cm/s !---------!-------!---------! !renal - Hilum      !        !        !         !       !         ! +-------------------+--------+--------+---------+-------+---------+ !Left renal -       !158 cm/s!22 cm/s !---------!-------!---------! !proximal           !        !        !         !       !         ! +-------------------+--------+--------+---------+-------+---------+ !Left renal - mid   !223 cm/s!72 cm/s !---------!-------!---------! +-------------------+--------+--------+---------+-------+---------+ !Left renal - distal!243 cm/s!44 cm/s !---------!-------!---------! +-------------------+--------+--------+---------+-------+---------+ !Left renal - Hilum !63 cm/s !12 cm/s !---------!-------!---------! +-------------------+--------+--------+---------+-------+---------+ !Left renal - Origin!60 cm/s !22 cm/s !---------!-------!---------! +-------------------+--------+--------+---------+-------+---------+ !Right arcuate -    !23 cm/s !8 cm/s  !---------!-------!---------! !Inferior           !        !        !         !       !         ! +-------------------+--------+--------+---------+-------+---------+ !Right segmental -  !24 cm/s !8 cm/s  !---------!-------!0.66     ! !superior           !        !        !         !       !         ! +-------------------+--------+--------+---------+-------+---------+ !  Right segmental -  !27 cm/s !7 cm/s  !---------!-------!0.74     ! !mid                !        !        !         !       !         ! +-------------------+--------+--------+---------+-------+---------+ !Right segmental -  !25 cm/s !5 cm/s  !---------!-------!0.8      ! !inferior           !        !        !         !       !          ! +-------------------+--------+--------+---------+-------+---------+ !L arcuate - mid    !15 cm/s !4 cm/s  !---------!-------!---------! +-------------------+--------+--------+---------+-------+---------+ !L segmental -      !18 cm/s !6 cm/s  !---------!-------!0.67     ! !superior           !        !        !         !       !         ! +-------------------+--------+--------+---------+-------+---------+ !L segmental - mid  !18 cm/s !5 cm/s  !---------!-------!0.71     ! +-------------------+--------+--------+---------+-------+---------+ !L segmental -      !14 cm/s !3 cm/s  !---------!-------!0.75     ! !inferior           !        !        !         !       !         ! +-------------------+--------+--------+---------+-------+---------+ !Right kidney       !--------!--------!11.4 cm  !-------!---------! +-------------------+--------+--------+---------+-------+---------+ !Left kidney        !--------!--------!9.42 cm  !-------!---------! +-------------------+--------+--------+---------+-------+---------+ !Right RAR          !--------!--------!---------!0.76   !---------! +-------------------+--------+--------+---------+-------+---------+ !Left RAR           !--------!--------!---------!2.38.  !---------! +-------------------+--------+--------+---------+-------+---------+  ------------------------------------------------------------------- Summary: Findings suggest 1-59% right renal artery stenosis and upper range 1-59% left renal artery stenosis.  Other specific details can be found in the table(s) above. Prepared and Electronically Authenticated by  Gae Gallop MD 2018-06-26T15:31:00        Sharion Dove, RVT 01/09/2017, 4:48 PM

## 2017-01-09 NOTE — ED Provider Notes (Signed)
[facility] DEPT Provider Note   CSN: [id number] Arrival date & time: 01/09/17  0254     History   Chief Complaint Chief Complaint  Patient presents with  . Abdominal Pain    HPI Jason [name] is a 71 y.o. male.  HPI  71 yo M with extensive PMhx as below including recurrent admissions for n/v and hypotension, s/p recent admission on 6/2 for sepsis 2/2 ?perforated diverticulum and AKI here with recurrent abd pain, nausea, and vomiting. Pt states that he felt well at home until three days ago, at which time he developed recurrent body aches, chills, and nausea. He has had persistent nausea, vomiting, and diarrhea since then with diffuse abd pain. He has not been eating or drinking due to this and has had progressively worsening fatigue. No fevers. HIs sx feel similar to his recent admission. He has been taking his ABX as prescribed.  Past Medical History:  Diagnosis Date  . Acid reflux disease with ulcer   . AKI (acute kidney injury) ([place])   . Anemia   . Arthritis   . Bipolar 1 disorder ([place])   . Carotid stenosis   . Chronic back pain   . Complication of anesthesia 2000   reaction to anesthesia had some swelling   . Depression   . GERD (gastroesophageal reflux disease)   . Hearing loss   . Hyperlipidemia   . Hypertension   . Prostate cancer [facility])    prostate cancer  . PTSD (post-traumatic stress disorder)   . Stroke ([place]) 08/2015  . TIA (transient ischemic attack)    has had several mini strokes    Patient Active Problem List   Diagnosis Date Noted  . Sepsis ([place]) 12/24/2016  . Acute blood loss anemia   . Melena   . Anemia 10/14/2015  . Hyperglycemia 10/14/2015  . Acid reflux disease with ulcer   . Bleeding gastrointestinal   . Symptomatic anemia   . Upper GI bleeding   . Carotid artery stenosis   . HLD (hyperlipidemia) 08/31/2015  . CVA (cerebral infarction) 08/30/2015  . Degeneration of lumbar or lumbosacral intervertebral disc 05/16/2014  . Weakness  05/03/2014  . Dysarthria 04/30/2014  . AKI (acute kidney injury) ([place]) 04/30/2014  . Hypotension 04/30/2014  . Fall 04/30/2014  . CVA (cerebral vascular accident) ([place]) 04/13/2014  . Stroke ([place]) 04/13/2014  . CAP (community acquired pneumonia) 02/13/2013  . Musculoskeletal pain 02/13/2013  . Hx of completed stroke 02/13/2013  . Bipolar 1 disorder ([place]) 02/13/2013  . Depression 02/13/2013  . HTN (hypertension) 02/13/2013  . H/O prostate cancer 02/13/2013  . Nausea with vomiting 02/13/2013  . GERD (gastroesophageal reflux disease) 02/13/2013  . Poor balance 02/13/2013    Past Surgical History:  Procedure Laterality Date  . BACK SURGERY    . CARDIAC CATHETERIZATION     cant remember when  . COLONOSCOPY N/A 10/16/2015   Procedure: COLONOSCOPY;  Surgeon: [name], MD;  Location: [facility] ENDOSCOPY;  Service: Gastroenterology;  Laterality: N/A;  . ESOPHAGOGASTRODUODENOSCOPY N/A 10/15/2015   Procedure: ESOPHAGOGASTRODUODENOSCOPY (EGD);  Surgeon: [name], MD;  Location: [facility];  Service: Gastroenterology;  Laterality: N/A;  . EYE SURGERY     cataracts  . GIVENS CAPSULE STUDY N/A 10/16/2015   Procedure: GIVENS CAPSULE STUDY;  Surgeon: [name], MD;  Location: [facility];  Service: Gastroenterology;  Laterality: N/A;  . PROSTATE SURGERY         Home Medications    Prior to Admission medications  Medication Sig Start Date End Date Taking? Authorizing Provider  amLODipine (NORVASC) 5 MG tablet Take 1 tablet (5 mg total) by mouth daily. Patient taking differently: Take 10 mg by mouth daily.  12/28/16  Yes Rai, Ripudeep K, MD  aspirin EC 325 MG tablet Take 325 mg by mouth at bedtime.   Yes [provider]  atorvastatin (LIPITOR) 40 MG tablet Take 1 tablet (40 mg total) by mouth daily at 6 PM. Patient taking differently: Take 40 mg by mouth at bedtime.  04/14/14  Yes Mikhail, [place], DO  calcium carbonate (OSCAL) 1500 (600 Ca) MG  TABS tablet Take 1,200 mg of elemental calcium by mouth at bedtime.   Yes [provider]  Carboxymethylcellulose Sodium (THERATEARS OP) Place 1 drop into both eyes 4 (four) times daily as needed (for dry eyes).    Yes [provider]  cefUROXime (CEFTIN) 500 MG tablet Take 1 tablet (500 mg total) by mouth 2 (two) times daily with a meal. X 10 days 12/28/16  Yes Rai, Ripudeep K, MD  ferrous sulfate 325 (65 FE) MG tablet Take 1 tablet (325 mg total) by mouth 2 (two) times daily with a meal. Patient taking differently: Take 650 mg by mouth at bedtime.  10/17/15  Yes Robbie Lis, MD  FLUoxetine (PROZAC) 20 MG capsule Take 60 mg by mouth at bedtime.    Yes [provider]  gabapentin (NEURONTIN) 600 MG tablet Take 1,200 mg by mouth at bedtime.   Yes [provider]  Glucosamine Sulfate 1000 MG CAPS Take 2,000 mg by mouth at bedtime.    Yes [provider]  lamoTRIgine (LAMICTAL) 25 MG tablet Take 100 mg by mouth at bedtime.    Yes [provider]  metroNIDAZOLE (FLAGYL) 500 MG tablet Take 1 tablet (500 mg total) by mouth 3 (three) times daily. X 10 days 12/28/16  Yes Rai, Ripudeep K, MD  mirtazapine (REMERON) 45 MG tablet Take 45 mg by mouth at bedtime.   Yes [provider]  omega-3 acid ethyl esters (LOVAZA) 1 g capsule Take 2 g by mouth at bedtime.   Yes [provider]  Oxycodone HCl 10 MG TABS Take 10 mg by mouth 3 (three) times daily as needed (for pain).   Yes [provider]  oxymetazoline (AFRIN) 0.05 % nasal spray Place 1-2 sprays into both nostrils 2 (two) times daily as needed for congestion.   Yes [provider]  pantoprazole (PROTONIX) 40 MG tablet Take 1 tablet (40 mg total) by mouth 2 (two) times daily before a meal. 12/28/16  Yes Rai, Ripudeep K, MD  prazosin (MINIPRESS) 1 MG capsule Take 3 mg by mouth at bedtime.   Yes [provider]  promethazine (PHENERGAN) 12.5 MG tablet Take 1 tablet  (12.5 mg total) by mouth every 6 (six) hours as needed for nausea or vomiting. 12/28/16  Yes Rai, Vernelle Emerald, MD    Family History Family History  Problem Relation Age of Onset  . Stroke Mother        Hemorrhagic stroke  . Cancer Father        Pancreatic cancer  . Hypertension Father     Social History Social History  Substance Use Topics  . Smoking status: Former Smoker    Quit date: 03/06/2003  . Smokeless tobacco: Never Used  . Alcohol use No     Comment: drank years ago     Allergies   Penicillins   Review of Systems Review  of Systems  Constitutional: Positive for chills and fatigue.  Gastrointestinal: Positive for abdominal pain, diarrhea, nausea and vomiting.  Neurological: Positive for weakness and light-headedness.  All other systems reviewed and are negative.    Physical Exam Updated Vital Signs BP (!) 217/64 (BP Location: Left Arm)   Pulse 89   Temp (!) 97.4 F (36.3 C) (Oral)   Resp 17   Wt 70.3 kg (155 lb)   SpO2 95%   BMI 26.61 kg/m   Physical Exam  Constitutional: He is oriented to person, place, and time. He appears well-developed and well-nourished. No distress.  HENT:  Head: Normocephalic and atraumatic.  Markedly dry MM  Eyes: Conjunctivae are normal.  Neck: Neck supple.  Cardiovascular: Normal rate, regular rhythm and normal heart sounds.  Exam reveals no friction rub.   No murmur heard. Pulmonary/Chest: Effort normal and breath sounds normal. No respiratory distress. He has no wheezes. He has no rales.  Abdominal: Soft. Bowel sounds are normal. He exhibits no distension. There is tenderness (generalized). There is no rebound and no guarding.  Musculoskeletal: He exhibits no edema.  Neurological: He is alert and oriented to person, place, and time. He exhibits normal muscle tone.  Skin: Skin is warm. Capillary refill takes less than 2 seconds.  Psychiatric: He has a normal mood and affect.  Nursing note and vitals reviewed.    ED  Treatments / Results  Labs (all labs ordered are listed, but only abnormal results are displayed) Labs Reviewed  LIPASE, BLOOD - Abnormal; Notable for the following:       Result Value   Lipase 580 (*)    All other components within normal limits  COMPREHENSIVE METABOLIC PANEL - Abnormal; Notable for the following:    Glucose, Bld 128 (*)    BUN 31 (*)    Creatinine, Ser 3.39 (*)    Calcium 8.3 (*)    Total Bilirubin 0.1 (*)    GFR calc non Af Amer 17 (*)    GFR calc Af Amer 20 (*)    All other components within normal limits  CBC - Abnormal; Notable for the following:    WBC 16.1 (*)    RBC 3.81 (*)    Hemoglobin 11.4 (*)    HCT 35.7 (*)    All other components within normal limits  I-STAT CG4 LACTIC ACID, ED - Abnormal; Notable for the following:    Lactic Acid, Venous 2.13 (*)    All other components within normal limits  CULTURE, BLOOD (ROUTINE X 2)  CULTURE, BLOOD (ROUTINE X 2)  URINE CULTURE  URINALYSIS, ROUTINE W REFLEX MICROSCOPIC  I-STAT CG4 LACTIC ACID, ED  I-STAT CG4 LACTIC ACID, ED    EKG  EKG Interpretation None       Radiology Ct Abdomen Pelvis Wo Contrast  Result Date: 01/09/2017 CLINICAL DATA:  Acute onset of generalized abdominal pain, nausea and diarrhea. Initial encounter. EXAM: CT ABDOMEN AND PELVIS WITHOUT CONTRAST TECHNIQUE: Multidetector CT imaging of the abdomen and pelvis was performed following the standard protocol without IV contrast. COMPARISON:  CT of the abdomen and pelvis performed 12/25/2016, and renal ultrasound performed 12/24/2016. MRI of the lumbar spine performed 05/25/2014 FINDINGS: Lower chest: The visualized lung bases are grossly clear. The visualized portions of the mediastinum are unremarkable. Hepatobiliary: The liver is unremarkable in appearance. The gallbladder is unremarkable in appearance. The common bile duct remains normal in caliber. Pancreas: The pancreas is within normal limits. Spleen: The spleen is unremarkable in  appearance. Adrenals/Urinary Tract: A 1.7 cm nodule is noted at the left adrenal gland. The right adrenal gland is unremarkable in appearance. Nonspecific perinephric stranding is noted bilaterally. There is a 5 mm stone at the proximal left ureter, just below the left renal pelvis, without significant hydronephrosis. This likely causes intermittent obstruction. No nonobstructing renal stones are identified. Stomach/Bowel: The stomach is unremarkable in appearance. The small bowel is within normal limits. The appendix is normal in caliber, without evidence of appendicitis. The colon is unremarkable in appearance. Scattered diverticulosis is noted along the entirety of the colon, without evidence of diverticulitis. Vascular/Lymphatic: Scattered calcification is seen along the abdominal aorta and its branches. The abdominal aorta is otherwise grossly unremarkable. The inferior vena cava is grossly unremarkable. No retroperitoneal lymphadenopathy is seen. No pelvic sidewall lymphadenopathy is identified. Reproductive: The bladder is mildly distended and grossly unremarkable. The patient is status post prostatectomy. Other: No additional soft tissue abnormalities are seen. Musculoskeletal: No acute osseous abnormalities are identified. Vacuum phenomenon is noted at L4-L5, with underlying facet disease along the lumbar spine. The visualized musculature is unremarkable in appearance. IMPRESSION: 1. 5 mm stone noted at the proximal left ureter, just below the left renal pelvis, without significant hydronephrosis. This likely causes intermittent obstruction. 2. Scattered diverticulosis along the entirety of the colon, without evidence of diverticulitis. 3. Scattered aortic atherosclerosis. 4. 1.7 cm nonspecific nodule at the left adrenal gland. Would correlate with adrenal labs, and consider adrenal protocol MRI or CT for further evaluation. Electronically Signed   By: Garald Balding M.D.   On: 01/09/2017 06:13     Procedures .Critical Care Performed by: Duffy Bruce Authorized by: Duffy Bruce     (including critical care time)  CRITICAL CARE Performed by: Evonnie Pat   Total critical care time: 35 minutes  Critical care time was exclusive of separately billable procedures and treating other patients.  Critical care was necessary to treat or prevent imminent or life-threatening deterioration.  Critical care was time spent personally by me on the following activities: development of treatment plan with patient and/or surrogate as well as nursing, discussions with consultants, evaluation of patient's response to treatment, examination of patient, obtaining history from patient or surrogate, ordering and performing treatments and interventions, ordering and review of laboratory studies, ordering and review of radiographic studies, pulse oximetry and re-evaluation of patient's condition.    Medications Ordered in ED Medications  metroNIDAZOLE (FLAGYL) IVPB 500 mg (not administered)  levofloxacin (LEVAQUIN) IVPB 500 mg (not administered)  sodium chloride 0.9 % bolus 1,000 mL (0 mLs Intravenous Stopped 01/09/17 0454)  sodium chloride 0.9 % bolus 1,000 mL (0 mLs Intravenous Stopped 01/09/17 0453)  levofloxacin (LEVAQUIN) IVPB 750 mg (0 mg Intravenous Stopped 01/09/17 0554)  metroNIDAZOLE (FLAGYL) IVPB 500 mg (0 mg Intravenous Stopped 01/09/17 0555)  lactated ringers bolus 1,000 mL (0 mLs Intravenous Stopped 01/09/17 0555)  iopamidol (ISOVUE-300) 61 % injection 15 mL (15 mLs Oral Contrast Given 01/09/17 0603)  fentaNYL (SUBLIMAZE) injection 50 mcg (50 mcg Intravenous Given 01/09/17 0655)  ondansetron (ZOFRAN) injection 4 mg (4 mg Intravenous Given 01/09/17 4580)     Initial Impression / Assessment and Plan / ED Course  I have reviewed the triage vital signs and the nursing notes.  Pertinent labs & imaging results that were available during my care of the patient were reviewed by me and  considered in my medical decision making (see chart for details).    71 yo M with PMHx as above here recurrent  n/v/d. On arrival, pt hypotensive, tachycardic. Lab work shows marked leukocytosis, AKI, and mild lactic acidosis. Lipase also elevated. Query of recurrent sepsis 2/2 recurrent diverticulitis, pancreatitis, possibly UTI. Pt given broad-spectrum ABX for intra-abd infection (+ penicillin allergy), and code sepsis activated with 30 cc/kg fluid bolus.  Hypotension improved with fluids. Pt feels improved. CT scan shows possible intermittent obstructing stone, o/w no signs of surgical abnormality. Admit for AKI, sepsis.  Final Clinical Impressions(s) / ED Diagnoses   Final diagnoses:  Sepsis, due to unspecified organism [facility])  AKI (acute kidney injury) ([place])    New Prescriptions New Prescriptions   No medications on file     Duffy Bruce, MD 01/09/17 1004

## 2017-01-09 NOTE — ED Notes (Signed)
Abnormal lab result MD Isaacs have been made aware

## 2017-01-10 DIAGNOSIS — N201 Calculus of ureter: Secondary | ICD-10-CM

## 2017-01-10 DIAGNOSIS — N179 Acute kidney failure, unspecified: Secondary | ICD-10-CM

## 2017-01-10 DIAGNOSIS — A419 Sepsis, unspecified organism: Secondary | ICD-10-CM | POA: Diagnosis not present

## 2017-01-10 DIAGNOSIS — E278 Other specified disorders of adrenal gland: Secondary | ICD-10-CM | POA: Diagnosis not present

## 2017-01-10 DIAGNOSIS — D649 Anemia, unspecified: Secondary | ICD-10-CM

## 2017-01-10 LAB — COMPREHENSIVE METABOLIC PANEL
ALBUMIN: 3 g/dL — AB (ref 3.5–5.0)
ALK PHOS: 73 U/L (ref 38–126)
ALT: 15 U/L — AB (ref 17–63)
ANION GAP: 5 (ref 5–15)
AST: 15 U/L (ref 15–41)
BUN: 16 mg/dL (ref 6–20)
CALCIUM: 8 mg/dL — AB (ref 8.9–10.3)
CO2: 23 mmol/L (ref 22–32)
CREATININE: 1.4 mg/dL — AB (ref 0.61–1.24)
Chloride: 114 mmol/L — ABNORMAL HIGH (ref 101–111)
GFR calc Af Amer: 58 mL/min — ABNORMAL LOW (ref >60)
GFR calc non Af Amer: 50 mL/min — ABNORMAL LOW (ref >60)
GLUCOSE: 83 mg/dL (ref 65–99)
Potassium: 3.7 mmol/L (ref 3.5–5.1)
SODIUM: 142 mmol/L (ref 135–145)
Total Bilirubin: 0.4 mg/dL (ref 0.3–1.2)
Total Protein: 5.2 g/dL — ABNORMAL LOW (ref 6.5–8.1)

## 2017-01-10 LAB — MAGNESIUM: Magnesium: 1.4 mg/dL — ABNORMAL LOW (ref 1.7–2.4)

## 2017-01-10 LAB — CBC WITH DIFFERENTIAL/PLATELET
BASOS PCT: 0 %
Basophils Absolute: 0 10*3/uL (ref 0.0–0.1)
Eosinophils Absolute: 0.2 10*3/uL (ref 0.0–0.7)
Eosinophils Relative: 3 %
HEMATOCRIT: 28.7 % — AB (ref 39.0–52.0)
Hemoglobin: 9.4 g/dL — ABNORMAL LOW (ref 13.0–17.0)
Lymphocytes Relative: 20 %
Lymphs Abs: 1.4 10*3/uL (ref 0.7–4.0)
MCH: 30.4 pg (ref 26.0–34.0)
MCHC: 32.8 g/dL (ref 30.0–36.0)
MCV: 92.9 fL (ref 78.0–100.0)
MONO ABS: 0.7 10*3/uL (ref 0.1–1.0)
MONOS PCT: 9 %
NEUTROS ABS: 4.9 10*3/uL (ref 1.7–7.7)
Neutrophils Relative %: 68 %
Platelets: 279 10*3/uL (ref 150–400)
RBC: 3.09 MIL/uL — ABNORMAL LOW (ref 4.22–5.81)
RDW: 13.9 % (ref 11.5–15.5)
WBC: 7.2 10*3/uL (ref 4.0–10.5)

## 2017-01-10 LAB — CREATININE, SERUM
Creatinine, Ser: 1.42 mg/dL — ABNORMAL HIGH (ref 0.61–1.24)
GFR calc Af Amer: 57 mL/min — ABNORMAL LOW (ref >60)
GFR calc non Af Amer: 49 mL/min — ABNORMAL LOW (ref >60)

## 2017-01-10 LAB — GASTROINTESTINAL PANEL BY PCR, STOOL (REPLACES STOOL CULTURE)

## 2017-01-10 LAB — LACTIC ACID, PLASMA: LACTIC ACID, VENOUS: 0.5 mmol/L (ref 0.5–1.9)

## 2017-01-10 LAB — LIPASE, BLOOD: Lipase: 35 U/L (ref 11–51)

## 2017-01-10 LAB — LIPID PANEL
CHOL/HDL RATIO: 4.1 ratio
CHOLESTEROL: 107 mg/dL (ref 0–200)
HDL: 26 mg/dL — AB (ref >40)
LDL Cholesterol: 68 mg/dL (ref 0–99)
TRIGLYCERIDES: 64 mg/dL (ref <150)
VLDL: 13 mg/dL (ref 0–40)

## 2017-01-10 LAB — CBC
HEMATOCRIT: 28.1 % — AB (ref 39.0–52.0)
Hemoglobin: 9.4 g/dL — ABNORMAL LOW (ref 13.0–17.0)
MCH: 30.8 pg (ref 26.0–34.0)
MCHC: 33.5 g/dL (ref 30.0–36.0)
MCV: 92.1 fL (ref 78.0–100.0)
Platelets: 270 10*3/uL (ref 150–400)
RBC: 3.05 MIL/uL — ABNORMAL LOW (ref 4.22–5.81)
RDW: 13.8 % (ref 11.5–15.5)
WBC: 7.4 10*3/uL (ref 4.0–10.5)

## 2017-01-10 LAB — CK: Total CK: 41 U/L — ABNORMAL LOW (ref 49–397)

## 2017-01-10 LAB — URINE CULTURE
Culture: NO GROWTH
Special Requests: NORMAL

## 2017-01-10 MED ORDER — MAGNESIUM SULFATE 2 GM/50ML IV SOLN
2.0000 g | Freq: Once | INTRAVENOUS | Status: AC
  Administered 2017-01-10: 2 g via INTRAVENOUS
  Filled 2017-01-10: qty 50

## 2017-01-10 NOTE — Consult Note (Signed)
Consultation  Referring Provider:   Dr. Maylene Roes   Primary Care Physician: [place] Primary Gastroenterologist: Dr. [name]         Reason for Consultation:  Anemia            HPI:   Jason [name] is a 71 y.o. male with past medical history of GERD, Anemia, Bipolar 1, PTSD and TIA as well as others listed below who presented to the ED on 01/09/17 with complaint of abdominal pain. At time of admission, he complained of recurrent body aches, chills and nausea as well as vomiting and diarrhea with diffuse abdominal pain. We were consulted today in regards to anemia and question of GI bleed.   Per review of chart recent admission 12/24/16-12/28/16 for AKI, free air in the abdomen which was thought due to perforated peptic ulcer vs diverticulum. Gen surgery was consulted and chose conservative management. The patient was placed on antibiotics and symptoms and white count improved. It was recommended that he have EGD and colonoscopy after discharge for further evaluation at the [place].    Today, the patient explains that after discharge on 12/28/16 he developed recurrent nausea and vomiting 2 days ago. His abdominal pain also worsened, which he describes as generalized. He also explains that he has seen some brb occasionally when wiping over the past month, but has not been paying much attention to this. He has also had some "looser stools than normal". He also explains during these "fever/chills" off and on mixed with sweats. He tells me this has occurred 10 times since his discharge. This is the most worrying symptom for him.   Mainly the patient tells me today that he is scheduled for a back surgery which he has been "waiting on" on Friday of this week. He would really not like to miss this. He tells me he is supposed to go in a few days before to see the anesthesiologist.   Patient tells me he has been using his iron twice a day since diagnosis of anemia last year. The last time he was evaluated for GI bleed he  was on Plavix, and has since discontinued this and is only on aspirin. He has also stopped using NSAIDs and "strictly uses Tylenol".   Patient denies weight loss.  ED Course: hypotensive, tachycardic, labs with marked leukocytosis, AKI and mild lactic acidosis, Lipase elevated. Give broad-spectrum abx for intra-abdominal infection and penicillin allergy. Code sepsis. CT scan showed no evidence of free air, b/l perinephric stransing, ureteral stone 79mm in size and AKI.   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 Acid reflux disease with ulcer   . AKI (acute kidney injury) ([place])   . Anemia   . Arthritis   . Bipolar 1 disorder ([place])   . Carotid stenosis   . Chronic back pain   . Complication of anesthesia 2000   reaction to anesthesia had some swelling   . Depression   . GERD (gastroesophageal reflux disease)   . Hearing loss   . Hyperlipidemia   . Hypertension   . Prostate cancer [facility])    prostate cancer  . PTSD (post-traumatic stress disorder)   . Stroke ([place]) 08/2015  . TIA (transient ischemic attack)  has had several mini strokes    Past Surgical History:  Procedure Laterality Date  . BACK SURGERY    . CARDIAC CATHETERIZATION     cant remember when  . COLONOSCOPY N/A 10/16/2015   Procedure: COLONOSCOPY;  Surgeon: [name], MD;  Location: [facility] ENDOSCOPY;  Service: Gastroenterology;  Laterality: N/A;  . ESOPHAGOGASTRODUODENOSCOPY N/A 10/15/2015   Procedure: ESOPHAGOGASTRODUODENOSCOPY (EGD);  Surgeon: [name], MD;  Location: [facility];  Service: Gastroenterology;  Laterality: N/A;  . EYE SURGERY     cataracts  . GIVENS CAPSULE STUDY N/A 10/16/2015   Procedure: GIVENS CAPSULE  STUDY;  Surgeon: [name], MD;  Location: [place];  Service: Gastroenterology;  Laterality: N/A;  . PROSTATE SURGERY      Family History  Problem Relation Age of Onset  . Stroke Mother        Hemorrhagic stroke  . Cancer Father        Pancreatic cancer  . Hypertension Father      Social History  Substance Use Topics  . Smoking status: Former Smoker    Quit date: 03/06/2003  . Smokeless tobacco: Never Used  . Alcohol use No     Comment: drank years ago    Prior to Admission medications   Medication Sig Start Date End Date Taking? Authorizing Provider  amLODipine (NORVASC) 5 MG tablet Take 1 tablet (5 mg total) by mouth daily. Patient taking differently: Take 10 mg by mouth daily.  12/28/16  Yes Rai, Ripudeep K, MD  aspirin EC 325 MG tablet Take 325 mg by mouth at bedtime.   Yes [provider]  atorvastatin (LIPITOR) 40 MG tablet Take 1 tablet (40 mg total) by mouth daily at 6 PM. Patient taking differently: Take 40 mg by mouth at bedtime.  04/14/14  Yes Mikhail, Clinton, DO  calcium carbonate (OSCAL) 1500 (600 Ca) MG TABS tablet Take 1,200 mg of elemental calcium by mouth at bedtime.   Yes [provider]  Carboxymethylcellulose Sodium (THERATEARS OP) Place 1 drop into both eyes 4 (four) times daily as needed (for dry eyes).    Yes [provider]  cefUROXime (CEFTIN) 500 MG tablet Take 1 tablet (500 mg total) by mouth 2 (two) times daily with a meal. X 10 days 12/28/16  Yes Rai, Ripudeep K, MD  ferrous sulfate 325 (65 FE) MG tablet Take 1 tablet (325 mg total) by mouth 2 (two) times daily with a meal. Patient taking differently: Take 650 mg by mouth at bedtime.  10/17/15  Yes Robbie Lis, MD  FLUoxetine (PROZAC) 20 MG capsule Take 60 mg by mouth at bedtime.    Yes [provider]  gabapentin (NEURONTIN) 600 MG tablet Take 1,200 mg by mouth at bedtime.   Yes [provider]  Glucosamine Sulfate 1000 MG CAPS Take 2,000  mg by mouth at bedtime.    Yes [provider]  lamoTRIgine (LAMICTAL) 25 MG tablet Take 100 mg by mouth at bedtime.    Yes [provider]  metroNIDAZOLE (FLAGYL) 500 MG tablet Take 1 tablet (500 mg total) by mouth 3 (three) times daily. X 10 days 12/28/16  Yes Rai, Ripudeep K, MD  mirtazapine (REMERON) 45 MG tablet Take 45 mg by mouth at bedtime.   Yes [provider]  omega-3 acid ethyl esters (LOVAZA) 1 g capsule Take 2 g by mouth at bedtime.   Yes [provider]  Oxycodone HCl 10 MG TABS Take 10 mg by mouth  3 (three) times daily as needed (for pain).   Yes [provider]  oxymetazoline (AFRIN) 0.05 % nasal spray Place 1-2 sprays into both nostrils 2 (two) times daily as needed for congestion.   Yes [provider]  pantoprazole (PROTONIX) 40 MG tablet Take 1 tablet (40 mg total) by mouth 2 (two) times daily before a meal. 12/28/16  Yes Rai, Ripudeep K, MD  prazosin (MINIPRESS) 1 MG capsule Take 3 mg by mouth at bedtime.   Yes [provider]  promethazine (PHENERGAN) 12.5 MG tablet Take 1 tablet (12.5 mg total) by mouth every 6 (six) hours as needed for nausea or vomiting. 12/28/16  Yes Rai, Vernelle Emerald, MD    Current Facility-Administered Medications  Medication Dose Route Frequency Provider Last Rate Last Dose  . 0.9 %  sodium chloride infusion   Intravenous Continuous Lavina Hamman, MD 150 mL/hr at 01/10/17 0513    . feeding supplement (BOOST / RESOURCE BREEZE) liquid 1 Container  1 Container Oral TID BM Lavina Hamman, MD   1 Container at 01/09/17 2000  . feeding supplement (ENSURE ENLIVE) (ENSURE ENLIVE) liquid 237 mL  237 mL Oral BID BM Lavina Hamman, MD      . ferrous sulfate tablet 325 mg  325 mg Oral BID WC Lavina Hamman, MD   325 mg at 01/10/17 0744  . FLUoxetine (PROZAC) capsule 60 mg  60 mg Oral QHS Lavina Hamman, MD   60 mg at 01/09/17 2108  . fluticasone (FLONASE) 50 MCG/ACT nasal spray 2 spray  2 spray Each  Nare Daily Rise Patience, MD   2 spray at 01/10/17 [id number]  . gabapentin (NEURONTIN) capsule 300 mg  300 mg Oral TID Lavina Hamman, MD   300 mg at 01/10/17 0942  . HYDROcodone-acetaminophen (NORCO/VICODIN) 5-325 MG per tablet 1-2 tablet  1-2 tablet Oral Q4H PRN Lavina Hamman, MD      . lamoTRIgine (LAMICTAL) tablet 100 mg  100 mg Oral QHS Lavina Hamman, MD   100 mg at 01/09/17 2109  . [START ON 01/11/2017] levofloxacin (LEVAQUIN) IVPB 750 mg  750 mg Intravenous Q48H Pham, Anh P, RPH      . metroNIDAZOLE (FLAGYL) IVPB 500 mg  500 mg Intravenous Marcella Dubs, MD   Stopped at 01/10/17 4196  . mirtazapine (REMERON) tablet 45 mg  45 mg Oral QHS Lavina Hamman, MD   45 mg at 01/09/17 2108  . morphine 4 MG/ML injection 2-4 mg  2-4 mg Intravenous Q3H PRN Lavina Hamman, MD   4 mg at 01/10/17 0514  . ondansetron (ZOFRAN) tablet 4 mg  4 mg Oral Q6H PRN Lavina Hamman, MD       Or  . ondansetron [facility]) injection 4 mg  4 mg Intravenous Q6H PRN Lavina Hamman, MD      . oxyCODONE (Oxy IR/ROXICODONE) immediate release tablet 10 mg  10 mg Oral TID PRN Lavina Hamman, MD   10 mg at 01/10/17 1147  . pantoprazole (PROTONIX) injection 40 mg  40 mg Intravenous Q12H Lavina Hamman, MD   40 mg at 01/10/17 0942  . saccharomyces boulardii (FLORASTOR) capsule 250 mg  250 mg Oral BID Lavina Hamman, MD   250 mg at 01/10/17 2229  . sodium chloride (OCEAN) 0.65 % nasal spray 1 spray  1 spray Each Nare PRN Lavina Hamman, MD      . sodium chloride flush (NS) 0.9 %  injection 3 mL  3 mL Intravenous Q12H Lavina Hamman, MD   3 mL at 01/09/17 1139  . tamsulosin (FLOMAX) capsule 0.4 mg  0.4 mg Oral QPC supper Lavina Hamman, MD   0.4 mg at 01/09/17 1718    Allergies as of 01/09/2017 - Review Complete 01/09/2017  Allergen Reaction Noted  . Penicillins Other (See Comments) 11/19/2012     Review of Systems:    Constitutional: No weight loss Skin: No rash  Cardiovascular: No chest  pain Respiratory: No SOB Gastrointestinal: See HPI and otherwise negative Genitourinary: No dysuria Neurological: No headache Musculoskeletal: No new muscle or joint pain Hematologic: No bruising Psychiatric: No history of depression or anxiety   Physical Exam:  Vital signs in last 24 hours: Temp:  [97.8 F (36.6 C)-98.1 F (36.7 C)] 97.8 F (36.6 C) (07/09 0559) Pulse Rate:  [91] 91 (07/08 2041) Resp:  [20] 20 (07/09 0559) BP: (118-140)/(67-71) 140/67 (07/09 0559) SpO2:  [96 %-97 %] 96 % (07/09 0559) Weight:  [184 lb 11.9 oz (83.8 kg)] 184 lb 11.9 oz (83.8 kg) (07/09 0559) Last BM Date: 01/09/17 General:   Pleasant Caucasian male appears to be in NAD, Well developed, Well nourished, alert and cooperative Head:  Normocephalic and atraumatic. Eyes:   PEERL, EOMI. No icterus. Conjunctiva pink. Ears:  Normal auditory acuity. Neck:  Supple Throat: Oral cavity and pharynx without inflammation, swelling or lesion.  Lungs: Respirations even and unlabored. Lungs clear to auscultation bilaterally.   No wheezes, crackles, or rhonchi.  Heart: Normal S1, S2. No MRG. Regular rate and rhythm. No peripheral edema, cyanosis or pallor.  Abdomen:  Soft, nondistended, nontender. No rebound or guarding. Normal bowel sounds. No appreciable masses or hepatomegaly. Rectal:  Not performed.  Msk:  Symmetrical without gross deformities. Extremities:  Without edema, no deformity or joint abnormality.  Neurologic:  Alert and  oriented x4;  grossly normal neurologically. Skin:   Dry and intact without significant lesions or rashes. Psychiatric:  Demonstrates good judgement and reason without abnormal affect or behaviors.   LAB RESULTS:  Recent Labs  01/09/17 0326 01/10/17 0447  WBC 16.1* 7.2  HGB 11.4* 9.4*  HCT 35.7* 28.7*  PLT 319 279   BMET  Recent Labs  01/09/17 0326 01/10/17 0447  NA 140 142  K 3.6 3.7  CL 106 114*  CO2 22 23  GLUCOSE 128* 83  BUN 31* 16  CREATININE 3.39* 1.40*   1.42*  CALCIUM 8.3* 8.0*   LFT  Recent Labs  01/10/17 0447  PROT 5.2*  ALBUMIN 3.0*  AST 15  ALT 15*  ALKPHOS 73  BILITOT 0.4   STUDIES: Ct Abdomen Pelvis Wo Contrast  Result Date: 01/09/2017 CLINICAL DATA:  Acute onset of generalized abdominal pain, nausea and diarrhea. Initial encounter. EXAM: CT ABDOMEN AND PELVIS WITHOUT CONTRAST TECHNIQUE: Multidetector CT imaging of the abdomen and pelvis was performed following the standard protocol without IV contrast. COMPARISON:  CT of the abdomen and pelvis performed 12/25/2016, and renal ultrasound performed 12/24/2016. MRI of the lumbar spine performed 05/25/2014 FINDINGS: Lower chest: The visualized lung bases are grossly clear. The visualized portions of the mediastinum are unremarkable. Hepatobiliary: The liver is unremarkable in appearance. The gallbladder is unremarkable in appearance. The common bile duct remains normal in caliber. Pancreas: The pancreas is within normal limits. Spleen: The spleen is unremarkable in appearance. Adrenals/Urinary Tract: A 1.7 cm nodule is noted at the left adrenal gland. The right adrenal gland is unremarkable in appearance. Nonspecific  perinephric stranding is noted bilaterally. There is a 5 mm stone at the proximal left ureter, just below the left renal pelvis, without significant hydronephrosis. This likely causes intermittent obstruction. No nonobstructing renal stones are identified. Stomach/Bowel: The stomach is unremarkable in appearance. The small bowel is within normal limits. The appendix is normal in caliber, without evidence of appendicitis. The colon is unremarkable in appearance. Scattered diverticulosis is noted along the entirety of the colon, without evidence of diverticulitis. Vascular/Lymphatic: Scattered calcification is seen along the abdominal aorta and its branches. The abdominal aorta is otherwise grossly unremarkable. The inferior vena cava is grossly unremarkable. No retroperitoneal  lymphadenopathy is seen. No pelvic sidewall lymphadenopathy is identified. Reproductive: The bladder is mildly distended and grossly unremarkable. The patient is status post prostatectomy. Other: No additional soft tissue abnormalities are seen. Musculoskeletal: No acute osseous abnormalities are identified. Vacuum phenomenon is noted at L4-L5, with underlying facet disease along the lumbar spine. The visualized musculature is unremarkable in appearance. IMPRESSION: 1. 5 mm stone noted at the proximal left ureter, just below the left renal pelvis, without significant hydronephrosis. This likely causes intermittent obstruction. 2. Scattered diverticulosis along the entirety of the colon, without evidence of diverticulitis. 3. Scattered aortic atherosclerosis. 4. 1.7 cm nonspecific nodule at the left adrenal gland. Would correlate with adrenal labs, and consider adrenal protocol MRI or CT for further evaluation. Electronically Signed   By: Garald Balding M.D.   On: 01/09/2017 06:13   Dg Abd 1 View  Result Date: 01/09/2017 CLINICAL DATA:  Left ureteral stone. EXAM: ABDOMEN - 1 VIEW COMPARISON:  CT scan earlier today. FINDINGS: The patient's known at 5 mm proximal left ureteral stone is not visible by x-ray. Bowel gas pattern is nonspecific. Surgical clips are noted in the central anatomic pelvis. IMPRESSION: Tiny proximal left ureteral stone not evident by x-ray. Electronically Signed   By: Misty Stanley M.D.   On: 01/09/2017 13:34   PREVIOUS ENDOSCOPIES:            See HPI   Impression / Plan:   Impression: 1. Lower Gi bleed: evaluated in ED (appears to be trauma from wiping), hgb from 11.4 to 9.4 overnight, h/o recent perforation- patient needing [facility] in the near future 2. Sepsis vs SIRS: ? Intra-abdominal etiology? Wbc now down on current abx regimen 3. Nausea and vomiting: resolved 4. AKI 5. Elevated Lipase: no pancreatitis on CT, Lipase elevated in 500's,  no pain currently-on clear  liquid diet 6. 37mm ureteral stone: urology on board, planning to hold on tx for now 7. Spinal stenosis: scheduled for outpatient surgery with [facility] at the end of the week  Plan: 1. Repeated stat CBC-if continuing to trend down, will need to proceed with EGD/COLO tomorow, if stable or improved, can likely hold on urgent procedures. Patient would like to avoid procedures if possible because he wants to have back surgery scheduled for Friday of this week with [facility] 2. Continue IV Protonix 40mg  BID 3. If we do not plan on procedures tomorrow, patient can have regular diet-otherwise clears today and NPO after midnight 4. Please await any further recommendations from Dr. Hilarie Fredrickson  Thank you for your kind consultation, we will continue to follow.  Lavone Nian Valerye Kobus  01/10/2017, 12:44 PM Pager #: [phone]

## 2017-01-10 NOTE — Progress Notes (Signed)
Physical Therapy Evaluation Patient Details Name: Jason [name] MRN: [id number] DOB: 1946-04-26 Today's Date: 01/10/2017   History of Present Illness  Pt is a 71 yo male with extensive PMhx including recurrent admissions for n/v and hypotension. Pt PMhx also includes stroke as well as AKI. Pt currently admitted for sepsis likely caused by AKI. Pt also complains of back pain and states he will be having surgery in the near future.   Clinical Impression  Pt admitted with sepsis. Pt currently with functional limitations due to the deficits listed below. Pt will benefit from skilled PT to increase their independence and safety with mobility to allow for discharge. Pt responded well to treatment but complained of back pain and diminished sensation in RLE. Pt was originally supposed to have back surgery on Friday but it was postponed until further notice.     Follow Up Recommendations No PT follow up    Equipment Recommendations  None recommended by PT    Recommendations for Other Services       Precautions / Restrictions Precautions Precautions: Fall Restrictions Weight Bearing Restrictions: No      Mobility  Bed Mobility Overal bed mobility: Needs Assistance Bed Mobility: Supine to Sit     Supine to sit: Supervision     General bed mobility comments: increased time and required supervision from supine to sit for safety  Transfers Overall transfer level: Needs assistance Equipment used: Rolling walker (2 wheeled) Transfers: Sit to/from Stand Sit to Stand: Supervision         General transfer comment: increased time and required supervision from sit to stand for safety, verbal cues given for forward trunk flexion during initiation  Ambulation/Gait Ambulation/Gait assistance: Min guard Ambulation Distance (Feet): 120 Feet Assistive device: Rolling walker (2 wheeled) Gait Pattern/deviations: Step-through pattern;Decreased stride length Gait velocity: decreased    General Gait Details: slightly unsteady upon initiation but became more stable during activity  Stairs            Wheelchair Mobility    Modified Rankin (Stroke Patients Only)       Balance Overall balance assessment: Needs assistance Sitting-balance support: No upper extremity supported;Feet supported Sitting balance-[name]: Good     Standing balance support: Bilateral upper extremity supported Standing balance-[name]: Poor Standing balance comment: UE supported by RW                             Pertinent Vitals/Pain Pain Assessment: 0-10 (pain not rated but pt states having chronic back pain) Pain Location: back Pain Descriptors / Indicators: Numbness;Aching Pain Intervention(s): Repositioned;Monitored during session    [place] expects to be discharged to:: Private residence Living Arrangements: Spouse/significant other;Other relatives Available Help at Discharge: Family;Available 24 hours/day Type of Home: House Home Access: Level entry;Stairs to enter Entrance Stairs-Rails: None Entrance Stairs-Number of Steps: 1 Home Layout: Two level Home Equipment: Walker - 2 wheels;Cane - single point;Tub bench Additional Comments: pt reports using RW at home and states he has a walker on each level of the house    Prior Function Level of Independence: Needs assistance   Gait / Transfers Assistance Needed: amb household distances modified independent with walker or cane. Uses chair lift on stairs     Comments: Uses cane and RW for household and community mobility     Hand Dominance        Extremity/Trunk Assessment   Upper Extremity Assessment Upper Extremity Assessment: Overall [facility]  for tasks assessed    Lower Extremity Assessment Lower Extremity Assessment: Generalized weakness;RLE deficits/detail RLE Sensation: decreased light touch       Communication   Communication: No difficulties  Cognition  Arousal/Alertness: Awake/alert Behavior During Therapy: WFL for tasks assessed/performed Overall Cognitive Status: Within Functional Limits for tasks assessed                                        General Comments      Exercises     Assessment/Plan    PT Assessment Patient needs continued PT services  PT Problem List Decreased strength;Decreased mobility;Decreased safety awareness;Impaired sensation;Decreased coordination;Decreased activity tolerance;Decreased balance       PT Treatment Interventions DME instruction;Gait training;Functional mobility training;Therapeutic activities;Therapeutic exercise;Balance training;Patient/family education    PT Goals (Current goals can be found in the Care Plan section)  Acute Rehab PT Goals Patient Stated Goal: Return home and have back surgery PT Goal Formulation: With patient Time For Goal Achievement: 01/24/17 Potential to Achieve Goals: Good    Frequency Min 3X/week   Barriers to discharge        Co-evaluation               AM-PAC PT "6 Clicks" Daily Activity  Outcome Measure Difficulty turning over in bed (including adjusting bedclothes, sheets and blankets)?: A Little Difficulty moving from lying on back to sitting on the side of the bed? : A Little Difficulty sitting down on and standing up from a chair with arms (e.g., wheelchair, bedside commode, etc,.)?: A Little Help needed moving to and from a bed to chair (including a wheelchair)?: A Little Help needed walking in hospital room?: A Little Help needed climbing 3-5 steps with a railing? : A Lot 6 Click Score: 17    End of Session Equipment Utilized During Treatment: Gait belt Activity Tolerance: Patient tolerated treatment well Patient left: in chair;with call bell/phone within reach;with chair alarm set   PT Visit Diagnosis: Muscle weakness (generalized) (M62.81);Difficulty in walking, not elsewhere classified (R26.2)    Time: 1683-7290 PT  Time Calculation (min) (ACUTE ONLY): 19 min   Charges:   PT Evaluation $PT Eval Low Complexity: 1 Procedure     PT G Codes:        Olegario Shearer, SPT  Reino Bellis 01/10/2017, 12:48 PM

## 2017-01-10 NOTE — Progress Notes (Signed)
Hgb stable at 9.4 with recent CBC-will hold off on urgent procedures, will see what hgb is tomorrow.  Ellouise Newer, [facility]

## 2017-01-10 NOTE — Progress Notes (Signed)
PROGRESS NOTE    JHACE FENNELL  RKY:[id number] DOB: 08/02/1945 DOA: 01/09/2017 PCP: System, Pcp Not In. [facility]    Brief Narrative:  Jason [name] is a 71 y.o. male with past medical history of GERD, bipolar disorder, chronic back pain, depression, dyslipidemia, lung nodule, PTSD with nightmares, CVA with residual deficit. Patient presented with complaints of abdominal pain. Patient was recently hospitalized for the same on 12/24/2016, had acute kidney injury with Nephrology consult and improvement in renal function, had some free air in the abdomen which was thought to be perforated peptic ulcer, diverticulum, Gen. surgery was consulted and conservative management was chosen. [facility]   After the discharge on 12/28/2016, patient developed recurrence of nausea and vomiting 2 days ago as well as blood in his stool. He also had low BP in 70/40s with EMS. Apparently, blood pressure has been significantly labile at home.  Assessment & Plan:   Principal Problem:   Sepsis ([place]) Active Problems:   Bipolar 1 disorder ([facility])   HTN (hypertension)   H/O prostate cancer   Nausea with vomiting   GERD (gastroesophageal reflux disease)   CVA (cerebral vascular accident) ([place])   AKI (acute kidney injury) ([place])   HLD (hyperlipidemia)   Anemia   Sepsis vs SIRS -CT abdomen pelvis shows 5 mm proximal ureteral stone without any obstruction or hydronephrosis, but shows bilateral perinephric stranding, however urinalysis and urine culture remain negative. No evidence of diverticulitis or free air (which was evident on prior CT scan in June 2018)  -Blood cultures negative to date  -C Diff negative  -GI PCR pending due to diarrhea  -IV Levaquin and Flagyl empirically for possible intra-abdominal etiology? May consider stopping soon if no etiology found   Acute blood loss anemia with BRBPR, hx GERD and peptic ulcer -Hgb 11.4 --> 9.4. Although could have dilutional  component, will consult GI due to report of bright red blood per rectum. Review of records show that he followed with Dr. 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 Patient to follow up as outpatient.   Pulmonary nodule  -Patient has 2 nodules in right lung apex. Recommendation last admission was to get a repeat CT scan in 3-6 months (from June 2018), if no worsening recommendation was to repeat another CAT scan in 18-24 months (from June 2018)  PTSD with nightmares -Holding prazosin for now due to hypotension on admission  Spinal stenosis -Foramen narrowing and L4-L5 and L5-S1 level. Patient scheduled for an outpatient surgery at [facility] soon, laminectomy  Bipolar -Continue prozac, lamictal, remeron    DVT prophylaxis: SCD Code Status: Full Family Communication: Attempted to reach wife by phone, left voicemail Disposition Plan: pending improvement   Consultants:   Urology  GI  Procedures:  None  Antimicrobials:  Anti-infectives    Start     Dose/Rate Route Frequency Ordered Stop   01/11/17 0400  levofloxacin (LEVAQUIN) IVPB 750 mg     750 mg 100 mL/hr over 90 Minutes Intravenous Every  48 hours 01/09/17 1132     01/10/17 0600  levofloxacin (LEVAQUIN) IVPB 500 mg  Status:  Discontinued     500 mg 100 mL/hr over 60 Minutes Intravenous Every 24 hours 01/09/17 0403 01/09/17 1132   01/09/17 1400  metroNIDAZOLE (FLAGYL) IVPB 500 mg     500 mg 100 mL/hr over 60 Minutes Intravenous Every 8 hours 01/09/17 0403     01/09/17 0400  levofloxacin (LEVAQUIN) IVPB 750 mg     750 mg 100 mL/hr over 90 Minutes Intravenous  Once 01/09/17 0346 01/09/17 0554   01/09/17 0400  metroNIDAZOLE (FLAGYL) IVPB 500 mg     500 mg 100 mL/hr over 60 Minutes Intravenous  Once 01/09/17 0346 01/09/17 0555       Subjective: Patient feeling mildly better today. Continues to have some abdominal pain. Without any chest pain, nausea or vomiting. Having loose stools.  Objective: Vitals:   01/09/17 1006 01/09/17 1506 01/09/17 2041 01/10/17 0559  BP: 110/62  118/71 140/67  Pulse: 88  91   Resp: 18 20 20 20   Temp: 97.8 F (36.6 C) 97.9 F (36.6 C) 98.1 F (36.7 C) 97.8 F (36.6 C)  TempSrc: Oral Oral Oral Oral  SpO2: 98%  97% 96%  Weight:    83.8 kg (184 lb 11.9 oz)  Height:        Intake/Output Summary (Last 24 hours) at 01/10/17 1250 Last data filed at 01/10/17 0957  Gross per 24 hour  Intake             3785 ml  Output             2275 ml  Net             1510 ml   Filed Weights   01/09/17 0652 01/09/17 1003 01/10/17 0559  Weight: 70.3 kg (155 lb) 79.3 kg (174 lb 13.2 oz) 83.8 kg (184 lb 11.9 oz)    Examination:  General exam: Appears calm and comfortable  Respiratory system: Clear to auscultation. Respiratory effort normal. Cardiovascular system: S1 & S2 heard, RRR. No JVD, murmurs, rubs, gallops or clicks. No pedal edema. Gastrointestinal system: Abdomen is nondistended, soft and nontender. No organomegaly or masses felt. Normal bowel sounds heard. Central nervous system: Alert and oriented. No focal neurological deficits. Extremities: Symmetric 5 x 5 power. Skin: No rashes, lesions  or ulcers Psychiatry: Judgement and insight appear normal. Mood & affect appropriate.   Data Reviewed: I have personally reviewed following labs and imaging studies  CBC:  Recent Labs Lab 01/09/17 0326 01/10/17 0447  WBC 16.1* 7.2  NEUTROABS  --  4.9  HGB 11.4* 9.4*  HCT 35.7* 28.7*  MCV 93.7 92.9  PLT 319 147   Basic Metabolic Panel:  Recent Labs Lab 01/09/17 0326 01/10/17 0447  NA 140 142  K 3.6 3.7  CL 106 114*  CO2 22 23  GLUCOSE 128* 83  BUN 31* 16  CREATININE 3.39* 1.40*  1.42*  CALCIUM 8.3* 8.0*  MG  --  1.4*   GFR: Estimated Creatinine Clearance: 47.9 mL/min (A) (by C-G formula based on SCr of 1.42 mg/dL (H)). Liver Function Tests:  Recent Labs Lab 01/09/17 0326 01/10/17 0447  AST 22 15  ALT 19 15*  ALKPHOS 84 73  BILITOT 0.1* 0.4  PROT 6.6 5.2*  ALBUMIN 3.7 3.0*    Recent Labs Lab 01/09/17 0326 01/10/17 0447  LIPASE 580* 35   No results for input(s): AMMONIA in the last 168 hours. Coagulation Profile: No results for input(s): INR, PROTIME in the last 168 hours. Cardiac Enzymes:  Recent Labs Lab 01/10/17 0447  CKTOTAL 41*   BNP (last 3 results) No results for input(s): PROBNP in the last 8760 hours. HbA1C: No results for input(s): HGBA1C in the last 72 hours. CBG: No results for input(s): GLUCAP in the last 168 hours. Lipid Profile:  Recent Labs  01/09/17 1713  CHOL 107  HDL 26*  LDLCALC 68  TRIG 64  CHOLHDL 4.1   Thyroid Function Tests: No results for input(s): TSH, T4TOTAL, FREET4, T3FREE, THYROIDAB in the last 72 hours. Anemia Panel: No results for input(s): VITAMINB12, FOLATE, FERRITIN, TIBC, IRON, RETICCTPCT in the last 72 hours. Sepsis Labs:  Recent Labs Lab 01/09/17 0344 01/09/17 0806 01/10/17 0530  LATICACIDVEN 2.13* 0.77 0.5    Recent Results (from the past 240 hour(s))  Blood culture (routine x 2)     Status: None (Preliminary result)   Collection Time: 01/09/17  3:45 AM  Result Value Ref Range  Status   Specimen Description BLOOD BLOOD RIGHT HAND  Final   Special Requests   Final    BOTTLES DRAWN AEROBIC AND ANAEROBIC Blood Culture adequate volume   Culture   Final    NO GROWTH 1 DAY Performed at [facility], [place] [address], [place], [place] [address]    Report Status PENDING  Incomplete  Blood culture (routine x 2)     Status: None (Preliminary result)   Collection Time: 01/09/17  4:05 AM  Result Value Ref Range Status   Specimen Description BLOOD BLOOD LEFT HAND  Final   Special Requests IN PEDIATRIC BOTTLE Blood Culture adequate volume  Final   Culture   Final    NO GROWTH 1 DAY Performed at [facility], [place] [address], [place], [place] [address]    Report Status PENDING  Incomplete  Urine culture     Status: None   Collection Time: 01/09/17  7:41 AM  Result Value Ref Range Status   Specimen Description URINE, CLEAN CATCH  Final   Special Requests Normal  Final   Culture   Final    NO GROWTH Performed at [facility], [place] [address], [place], [place] [address]    Report Status 01/10/2017 FINAL  Final  C difficile quick scan w PCR reflex     Status: None   Collection Time: 01/09/17 10:24 AM  Result Value Ref Range Status   C Diff antigen NEGATIVE NEGATIVE Final   C Diff toxin NEGATIVE NEGATIVE Final   C Diff interpretation No C. difficile detected.  Final       Radiology Studies: Ct Abdomen Pelvis Wo Contrast  Result Date: 01/09/2017 CLINICAL DATA:  Acute onset of generalized abdominal pain, nausea and diarrhea. Initial encounter. EXAM: CT ABDOMEN AND PELVIS WITHOUT CONTRAST TECHNIQUE: Multidetector CT imaging of the abdomen and pelvis was performed following the standard protocol without IV contrast. COMPARISON:  CT of the abdomen and pelvis performed 12/25/2016, and renal ultrasound performed 12/24/2016. MRI of the lumbar spine performed 05/25/2014 FINDINGS: Lower chest: The visualized lung bases are grossly clear. The visualized portions of  the mediastinum are unremarkable. Hepatobiliary: The liver is unremarkable in appearance. The gallbladder is unremarkable  in appearance. The common bile duct remains normal in caliber. Pancreas: The pancreas is within normal limits. Spleen: The spleen is unremarkable in appearance. Adrenals/Urinary Tract: A 1.7 cm nodule is noted at the left adrenal gland. The right adrenal gland is unremarkable in appearance. Nonspecific perinephric stranding is noted bilaterally. There is a 5 mm stone at the proximal left ureter, just below the left renal pelvis, without significant hydronephrosis. This likely causes intermittent obstruction. No nonobstructing renal stones are identified. Stomach/Bowel: The stomach is unremarkable in appearance. The small bowel is within normal limits. The appendix is normal in caliber, without evidence of appendicitis. The colon is unremarkable in appearance. Scattered diverticulosis is noted along the entirety of the colon, without evidence of diverticulitis. Vascular/Lymphatic: Scattered calcification is seen along the abdominal aorta and its branches. The abdominal aorta is otherwise grossly unremarkable. The inferior vena cava is grossly unremarkable. No retroperitoneal lymphadenopathy is seen. No pelvic sidewall lymphadenopathy is identified. Reproductive: The bladder is mildly distended and grossly unremarkable. The patient is status post prostatectomy. Other: No additional soft tissue abnormalities are seen. Musculoskeletal: No acute osseous abnormalities are identified. Vacuum phenomenon is noted at L4-L5, with underlying facet disease along the lumbar spine. The visualized musculature is unremarkable in appearance. IMPRESSION: 1. 5 mm stone noted at the proximal left ureter, just below the left renal pelvis, without significant hydronephrosis. This likely causes intermittent obstruction. 2. Scattered diverticulosis along the entirety of the colon, without evidence of diverticulitis. 3.  Scattered aortic atherosclerosis. 4. 1.7 cm nonspecific nodule at the left adrenal gland. Would correlate with adrenal labs, and consider adrenal protocol MRI or CT for further evaluation. Electronically Signed   By: Garald Balding M.D.   On: 01/09/2017 06:13   Dg Abd 1 View  Result Date: 01/09/2017 CLINICAL DATA:  Left ureteral stone. EXAM: ABDOMEN - 1 VIEW COMPARISON:  CT scan earlier today. FINDINGS: The patient's known at 5 mm proximal left ureteral stone is not visible by x-ray. Bowel gas pattern is nonspecific. Surgical clips are noted in the central anatomic pelvis. IMPRESSION: Tiny proximal left ureteral stone not evident by x-ray. Electronically Signed   By: Misty Stanley M.D.   On: 01/09/2017 13:34      Scheduled Meds: . feeding supplement  1 Container Oral TID BM  . feeding supplement (ENSURE ENLIVE)  237 mL Oral BID BM  . ferrous sulfate  325 mg Oral BID WC  . FLUoxetine  60 mg Oral QHS  . fluticasone  2 spray Each Nare Daily  . gabapentin  300 mg Oral TID  . lamoTRIgine  100 mg Oral QHS  . mirtazapine  45 mg Oral QHS  . pantoprazole (PROTONIX) IV  40 mg Intravenous Q12H  . saccharomyces boulardii  250 mg Oral BID  . sodium chloride flush  3 mL Intravenous Q12H  . tamsulosin  0.4 mg Oral QPC supper   Continuous Infusions: . sodium chloride 150 mL/hr at 01/10/17 0513  . [START ON 01/11/2017] levofloxacin (LEVAQUIN) IV    . metronidazole Stopped (01/10/17 0612)     LOS: 1 day    Time spent: 40 minutes   Dessa Phi, DO [facility] [email or web address] Password [email or web address] 01/10/2017, 12:50 PM

## 2017-01-10 NOTE — Progress Notes (Signed)
Subjective: Patient reports no flank pain/hematuria  Objective: Vital signs in last 24 hours: Temp:  [97.8 F (36.6 C)-98.1 F (36.7 C)] 97.8 F (36.6 C) (07/09 0559) Pulse Rate:  [91] 91 (07/08 2041) Resp:  [20] 20 (07/09 0559) BP: (118-140)/(67-71) 140/67 (07/09 0559) SpO2:  [96 %-97 %] 96 % (07/09 0559) Weight:  [83.8 kg (184 lb 11.9 oz)] 83.8 kg (184 lb 11.9 oz) (07/09 0559)  Intake/Output from previous day: 07/08 0701 - 07/09 0700 In: 3785 [P.O.:600; I.V.:2585; IV Piggyback:600] Out: 2375 [Urine:2375] Intake/Output this shift: Total I/O In: -  Out: 500 [Urine:500]  Physical Exam:  Constitutional: Vital signs reviewed. WD WN in NAD   Eyes: PERRL, No scleral icterus.   Cardiovascular: RRR Pulmonary/Chest: Normal effort Extremities: No cyanosis or edema   Lab Results:  Recent Labs  01/09/17 0326 01/10/17 0447  HGB 11.4* 9.4*  HCT 35.7* 28.7*   BMET  Recent Labs  01/09/17 0326 01/10/17 0447  NA 140 142  K 3.6 3.7  CL 106 114*  CO2 22 23  GLUCOSE 128* 83  BUN 31* 16  CREATININE 3.39* 1.40*  1.42*  CALCIUM 8.3* 8.0*   No results for input(s): LABPT, INR in the last 72 hours. No results for input(s): LABURIN in the last 72 hours. Results for orders placed or performed during the hospital encounter of 01/09/17  Urine culture     Status: None   Collection Time: 01/09/17  7:41 AM  Result Value Ref Range Status   Specimen Description URINE, CLEAN CATCH  Final   Special Requests Normal  Final   Culture   Final    NO GROWTH Performed at [facility], [place] [address] [place], [place] [address]    Report Status 01/10/2017 FINAL  Final  C difficile quick scan w PCR reflex     Status: None   Collection Time: 01/09/17 10:24 AM  Result Value Ref Range Status   C Diff antigen NEGATIVE NEGATIVE Final   C Diff toxin NEGATIVE NEGATIVE Final   C Diff interpretation No C. difficile detected.  Final    Studies/Results: Ct Abdomen Pelvis Wo  Contrast  Result Date: 01/09/2017 CLINICAL DATA:  Acute onset of generalized abdominal pain, nausea and diarrhea. Initial encounter. EXAM: CT ABDOMEN AND PELVIS WITHOUT CONTRAST TECHNIQUE: Multidetector CT imaging of the abdomen and pelvis was performed following the standard protocol without IV contrast. COMPARISON:  CT of the abdomen and pelvis performed 12/25/2016, and renal ultrasound performed 12/24/2016. MRI of the lumbar spine performed 05/25/2014 FINDINGS: Lower chest: The visualized lung bases are grossly clear. The visualized portions of the mediastinum are unremarkable. Hepatobiliary: The liver is unremarkable in appearance. The gallbladder is unremarkable in appearance. The common bile duct remains normal in caliber. Pancreas: The pancreas is within normal limits. Spleen: The spleen is unremarkable in appearance. Adrenals/Urinary Tract: A 1.7 cm nodule is noted at the left adrenal gland. The right adrenal gland is unremarkable in appearance. Nonspecific perinephric stranding is noted bilaterally. There is a 5 mm stone at the proximal left ureter, just below the left renal pelvis, without significant hydronephrosis. This likely causes intermittent obstruction. No nonobstructing renal stones are identified. Stomach/Bowel: The stomach is unremarkable in appearance. The small bowel is within normal limits. The appendix is normal in caliber, without evidence of appendicitis. The colon is unremarkable in appearance. Scattered diverticulosis is noted along the entirety of the colon, without evidence of diverticulitis. Vascular/Lymphatic: Scattered calcification is seen along the abdominal aorta and its  branches. The abdominal aorta is otherwise grossly unremarkable. The inferior vena cava is grossly unremarkable. No retroperitoneal lymphadenopathy is seen. No pelvic sidewall lymphadenopathy is identified. Reproductive: The bladder is mildly distended and grossly unremarkable. The patient is status post  prostatectomy. Other: No additional soft tissue abnormalities are seen. Musculoskeletal: No acute osseous abnormalities are identified. Vacuum phenomenon is noted at L4-L5, with underlying facet disease along the lumbar spine. The visualized musculature is unremarkable in appearance. IMPRESSION: 1. 5 mm stone noted at the proximal left ureter, just below the left renal pelvis, without significant hydronephrosis. This likely causes intermittent obstruction. 2. Scattered diverticulosis along the entirety of the colon, without evidence of diverticulitis. 3. Scattered aortic atherosclerosis. 4. 1.7 cm nonspecific nodule at the left adrenal gland. Would correlate with adrenal labs, and consider adrenal protocol MRI or CT for further evaluation. Electronically Signed   By: Garald Balding M.D.   On: 01/09/2017 06:13   Dg Abd 1 View  Result Date: 01/09/2017 CLINICAL DATA:  Left ureteral stone. EXAM: ABDOMEN - 1 VIEW COMPARISON:  CT scan earlier today. FINDINGS: The patient's known at 5 mm proximal left ureteral stone is not visible by x-ray. Bowel gas pattern is nonspecific. Surgical clips are noted in the central anatomic pelvis. IMPRESSION: Tiny proximal left ureteral stone not evident by x-ray. Electronically Signed   By: Misty Stanley M.D.   On: 01/09/2017 13:34    Assessment/Plan:   Left proximal ureteral stone--asymptomatic  OK for d/c from GU standpoint. I spoke w/ him regarding stone extraction--ok to do this after back surgery--we will call him to set this up as an outpt   LOS: 1 day   Franchot Gallo M 01/10/2017, 11:20 AM

## 2017-01-11 ENCOUNTER — Encounter (HOSPITAL_COMMUNITY): Payer: Self-pay

## 2017-01-11 ENCOUNTER — Inpatient Hospital Stay (HOSPITAL_COMMUNITY): Payer: Medicare Other

## 2017-01-11 DIAGNOSIS — A419 Sepsis, unspecified organism: Secondary | ICD-10-CM | POA: Diagnosis not present

## 2017-01-11 DIAGNOSIS — N179 Acute kidney failure, unspecified: Secondary | ICD-10-CM | POA: Diagnosis not present

## 2017-01-11 DIAGNOSIS — E278 Other specified disorders of adrenal gland: Secondary | ICD-10-CM | POA: Diagnosis not present

## 2017-01-11 LAB — CBC WITH DIFFERENTIAL/PLATELET
BASOS ABS: 0.1 10*3/uL (ref 0.0–0.1)
BASOS PCT: 1 %
EOS ABS: 0.4 10*3/uL (ref 0.0–0.7)
Eosinophils Relative: 6 %
HCT: 30.2 % — ABNORMAL LOW (ref 39.0–52.0)
HEMOGLOBIN: 9.9 g/dL — AB (ref 13.0–17.0)
Lymphocytes Relative: 18 %
Lymphs Abs: 1.3 10*3/uL (ref 0.7–4.0)
MCH: 30.2 pg (ref 26.0–34.0)
MCHC: 32.8 g/dL (ref 30.0–36.0)
MCV: 92.1 fL (ref 78.0–100.0)
Monocytes Absolute: 0.5 10*3/uL (ref 0.1–1.0)
Monocytes Relative: 7 %
NEUTROS PCT: 68 %
Neutro Abs: 4.9 10*3/uL (ref 1.7–7.7)
Platelets: 276 10*3/uL (ref 150–400)
RBC: 3.28 MIL/uL — AB (ref 4.22–5.81)
RDW: 13.7 % (ref 11.5–15.5)
WBC: 7.2 10*3/uL (ref 4.0–10.5)

## 2017-01-11 LAB — BASIC METABOLIC PANEL
ANION GAP: 6 (ref 5–15)
BUN: 10 mg/dL (ref 6–20)
CHLORIDE: 114 mmol/L — AB (ref 101–111)
CO2: 24 mmol/L (ref 22–32)
CREATININE: 1.07 mg/dL (ref 0.61–1.24)
Calcium: 8.7 mg/dL — ABNORMAL LOW (ref 8.9–10.3)
GFR calc non Af Amer: >60 mL/min (ref >60)
Glucose, Bld: 98 mg/dL (ref 65–99)
POTASSIUM: 4.4 mmol/L (ref 3.5–5.1)
SODIUM: 144 mmol/L (ref 135–145)

## 2017-01-11 MED ORDER — ACETAMINOPHEN 325 MG PO TABS: 650.0000 mg | ORAL_TABLET | Freq: Four times a day (QID) | ORAL | Status: DC | PRN

## 2017-01-11 MED ORDER — LEVOFLOXACIN IN D5W 750 MG/150ML IV SOLN: 750.0000 mg | INTRAVENOUS | Status: DC

## 2017-01-11 NOTE — Progress Notes (Signed)
MRI notified RN that the patient needs to go to MRI. RN will take patient to MRI.

## 2017-01-11 NOTE — Care Management Note (Signed)
Case Management Note  Patient Details  Name: Jason [name] MRN: [id number] Date of Birth: 05/07/1946  Subjective/Objective:70 y/o m admitted w/Sepsis. Readmit-Sepsis. From home.                    Action/Plan:d/c plan home.   Expected Discharge Date:                  Expected Discharge Plan:  Home/Self Care  In-House Referral:     Discharge planning Services  CM Consult  Post Acute Care Choice:    Choice offered to:     DME Arranged:    DME Agency:     HH Arranged:    HH Agency:     Status of Service:  In process, will continue to follow  If discussed at Long Length of Stay Meetings, dates discussed:    Additional Comments:  Dessa Phi, RN 01/11/2017, 1:39 PM

## 2017-01-11 NOTE — Progress Notes (Signed)
PROGRESS NOTE    Jason [name]  XHB:[id number] DOB: 1945-12-19 DOA: 01/09/2017 PCP: System, Pcp Not In. [facility]    Brief Narrative:  Jason [name] is a 71 y.o. male with past medical history of GERD, bipolar disorder, chronic back pain, depression, dyslipidemia, lung nodule, PTSD with nightmares, CVA with residual deficit. Patient presented with complaints of abdominal pain. Patient was recently hospitalized for the same on 12/24/2016, had acute kidney injury with Nephrology consult and improvement in renal function, had some free air in the abdomen which was thought to be perforated peptic ulcer, diverticulum, Gen. surgery was consulted and conservative management was chosen. [facility]   After the discharge on 12/28/2016, patient developed recurrence of nausea and vomiting 2 days ago as well as blood in his stool. He also had low BP in 70/40s with EMS. Apparently, blood pressure has been significantly labile at home. CT abdomen and pelvis was obtained on admission, showed 5 millimeter left ureter stone as well as diverticulosis and 1.7 cm left adrenal gland nodule. This left adrenal nodule was visualized on CT scan 6/23 and was deemed likely benign. However, with patient's history of labile blood pressure, hypertensive in the 200s with hypotension in the 70s, further workup was deemed necessary. MR abdomen was completed which was indeterminate. Further workup for metanephrines and catecholamines are pending at time. GI was also consulted secondary to report of bright red blood per rectum as well as anemia. Hemoglobin has remained stable.  Assessment & Plan:   Principal Problem:   Sepsis ([place]) Active Problems:   Bipolar 1 disorder (HCC)   HTN (hypertension)   H/O prostate cancer   Nausea with vomiting   GERD (gastroesophageal reflux disease)   CVA (cerebral vascular accident) ([place])   AKI (acute kidney injury) ([place])   HLD (hyperlipidemia)   Anemia  Ureteral calculus   SIRS - no infectious source found  -CT abdomen pelvis shows 5 mm proximal ureteral stone without any obstruction or hydronephrosis, but shows bilateral perinephric stranding, however urinalysis and urine culture remain negative. No evidence of diverticulitis or free air (which was evident on prior CT scan in June 2018)  -Blood cultures negative to date  -C Diff negative  -GI PCR negative  -Urine culture negative  -Treated with empiric IV Levaquin and Flagyl on admission, no source of infectious etiology found. Stop antibiotics and monitor. Symptoms of nausea, vomiting, abdominal pain have now all resolved.   Acute blood loss anemia with BRBPR, hx GERD and peptic ulcer -Review of records show that he followed with Dr. 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  -Hold aspirin, no longer on Plavix, denies ibuprofen use  -Continue iron supplementation  -Protonix  -GI consulted; the patient currently does not have any evidence of overt bleeding and hemoglobin has been stable, they recommend outpatient upper endoscopy and colonoscopy to evaluate for recent symptoms. This can be done through the [place] [facility].  Episodic hypertension followed by hypotension With 1.7cm left adrenal gland nodule  -Aldosteron/Renin activity last admission, < 0.4  -Renal doppler US with 1-59% stenosis bilaterally  -Stop home afrin spray  -MR abdomen was indeterminate by noncontrast evaluation, possibly a benign lipid poor adenoma  -Work up with 24 hour urine catecholamine, metanephrine pending -BP higher this morning. Will need to make sure BP and work up of adrenal nodule  is complete as patient does have back surgery planned soon   AKI -Prerenal etiology with nausea, vomiting, diarrhea -No evidence of hydronephrosis on CT -Resolved with IVF   32mm ureteral  stone -Appreciate urology consult. Patient to follow up as outpatient.   Pulmonary nodule  -Patient has 2 nodules in right lung apex. Recommendation last admission was to get a repeat CT scan in 3-6 months (from June 2018), if no worsening recommendation was to repeat another CAT scan in 18-24 months (from June 2018)  PTSD with nightmares -Holding prazosin for now due to hypotension on admission  Spinal stenosis -Foramen narrowing and L4-L5 and L5-S1 level. Patient scheduled for an outpatient surgery at [facility] soon, laminectomy  Bipolar -Continue prozac, lamictal, remeron    DVT prophylaxis: SCD Code Status: Full Family Communication: Attempted to reach wife by phone, left voicemail yesterday and again today  Disposition Plan: pending further work up, BP stabilization    Consultants:   Urology  GI  Procedures:   None  Antimicrobials:  Anti-infectives    Start     Dose/Rate Route Frequency Ordered Stop   01/12/17 0600  levofloxacin (LEVAQUIN) IVPB 750 mg  Status:  Discontinued     750 mg 100 mL/hr over 90 Minutes Intravenous Every 24 hours 01/11/17 0722 01/11/17 1000   01/11/17 0400  levofloxacin (LEVAQUIN) IVPB 750 mg  Status:  Discontinued     750 mg 100 mL/hr over 90 Minutes Intravenous Every 48 hours 01/09/17 1132 01/11/17 0722   01/10/17 0600  levofloxacin (LEVAQUIN) IVPB 500 mg  Status:  Discontinued     500 mg 100 mL/hr over 60 Minutes Intravenous Every 24 hours 01/09/17 0403 01/09/17 1132   01/09/17 1400  metroNIDAZOLE (FLAGYL) IVPB 500 mg  Status:  Discontinued     500 mg 100 mL/hr over 60 Minutes Intravenous Every 8 hours 01/09/17 0403 01/11/17 1000   01/09/17 0400  levofloxacin (LEVAQUIN) IVPB 750 mg     750 mg 100 mL/hr over 90 Minutes Intravenous  Once 01/09/17 0346 01/09/17 0554   01/09/17 0400  metroNIDAZOLE (FLAGYL) IVPB 500 mg     500 mg 100 mL/hr over 60 Minutes Intravenous  Once 01/09/17 0346 01/09/17 0555       Subjective: Patient feeling  well today. He denies any further nausea, vomiting, abdominal pain or diarrhea. He wants to be discharged as he has a back surgery scheduled for this Friday. He still needs to meet with anesthesiology for preop assessment. Discussed importance of workup of adrenal nodule as he has had labile blood pressures at home as well.   Objective: Vitals:   01/10/17 0559 01/10/17 1422 01/10/17 2111 01/11/17 0517  BP: 140/67 130/73 (!) 155/86 (!) 172/90  Pulse:  80 85 83  Resp: 20 18 20 18   Temp: 97.8 F (36.6 C) 97.9 F (36.6 C) 98.2 F (36.8 C) 98 F (36.7 C)  TempSrc: Oral Oral Oral Oral  SpO2: 96% 98% 98% 97%  Weight: 83.8 kg (184 lb 11.9 oz)   81.2 kg (179 lb 0.2 oz)  Height:        Intake/Output Summary (Last 24 hours) at 01/11/17 1217 Last data filed at 01/11/17 0742  Gross per 24 hour  Intake             3860 ml  Output             1675 ml  Net             2185 ml  Filed Weights   01/09/17 1003 01/10/17 0559 01/11/17 0517  Weight: 79.3 kg (174 lb 13.2 oz) 83.8 kg (184 lb 11.9 oz) 81.2 kg (179 lb 0.2 oz)    Examination:  General exam: Appears calm and comfortable  Respiratory system: Clear to auscultation. Respiratory effort normal. Cardiovascular system: S1 & S2 heard, RRR. No JVD, murmurs, rubs, gallops or clicks. No pedal edema. Gastrointestinal system: Abdomen is nondistended, soft and nontender. No organomegaly or masses felt. Normal bowel sounds heard. Central nervous system: Alert and oriented. No focal neurological deficits. Extremities: Symmetric 5 x 5 power. Skin: No rashes, lesions or ulcers Psychiatry: Judgement and insight appear normal. Mood & affect appropriate.   Data Reviewed: I have personally reviewed following labs and imaging studies  CBC:  Recent Labs Lab 01/09/17 0326 01/10/17 0447 01/10/17 1453 01/11/17 0517  WBC 16.1* 7.2 7.4 7.2  NEUTROABS  --  4.9  --  4.9  HGB 11.4* 9.4* 9.4* 9.9*  HCT 35.7* 28.7* 28.1* 30.2*  MCV 93.7 92.9 92.1 92.1    PLT 319 279 270 932   Basic Metabolic Panel:  Recent Labs Lab 01/09/17 0326 01/10/17 0447 01/11/17 0517  NA 140 142 144  K 3.6 3.7 4.4  CL 106 114* 114*  CO2 22 23 24   GLUCOSE 128* 83 98  BUN 31* 16 10  CREATININE 3.39* 1.40*  1.42* 1.07  CALCIUM 8.3* 8.0* 8.7*  MG  --  1.4*  --    GFR: Estimated Creatinine Clearance: 62.7 mL/min (by C-G formula based on SCr of 1.07 mg/dL). Liver Function Tests:  Recent Labs Lab 01/09/17 0326 01/10/17 0447  AST 22 15  ALT 19 15*  ALKPHOS 84 73  BILITOT 0.1* 0.4  PROT 6.6 5.2*  ALBUMIN 3.7 3.0*    Recent Labs Lab 01/09/17 0326 01/10/17 0447  LIPASE 580* 35   No results for input(s): AMMONIA in the last 168 hours. Coagulation Profile: No results for input(s): INR, PROTIME in the last 168 hours. Cardiac Enzymes:  Recent Labs Lab 01/10/17 0447  CKTOTAL 41*   BNP (last 3 results) No results for input(s): PROBNP in the last 8760 hours. HbA1C: No results for input(s): HGBA1C in the last 72 hours. CBG: No results for input(s): GLUCAP in the last 168 hours. Lipid Profile:  Recent Labs  01/09/17 1713  CHOL 107  HDL 26*  LDLCALC 68  TRIG 64  CHOLHDL 4.1   Thyroid Function Tests: No results for input(s): TSH, T4TOTAL, FREET4, T3FREE, THYROIDAB in the last 72 hours. Anemia Panel: No results for input(s): VITAMINB12, FOLATE, FERRITIN, TIBC, IRON, RETICCTPCT in the last 72 hours. Sepsis Labs:  Recent Labs Lab 01/09/17 0344 01/09/17 0806 01/10/17 0530  LATICACIDVEN 2.13* 0.77 0.5    Recent Results (from the past 240 hour(s))  Blood culture (routine x 2)     Status: None (Preliminary result)   Collection Time: 01/09/17  3:45 AM  Result Value Ref Range Status   Specimen Description BLOOD BLOOD RIGHT HAND  Final   Special Requests   Final    BOTTLES DRAWN AEROBIC AND ANAEROBIC Blood Culture adequate volume   Culture   Final    NO GROWTH 2 DAYS Performed at [facility], [address] [place],  [place] [address]    Report Status PENDING  Incomplete  Blood culture (routine x 2)     Status: None (Preliminary result)   Collection Time: 01/09/17  4:05 AM  Result Value Ref Range Status   Specimen  Description BLOOD BLOOD LEFT HAND  Final   Special Requests IN PEDIATRIC BOTTLE Blood Culture adequate volume  Final   Culture   Final    NO GROWTH 2 DAYS Performed at [facility], [address] [place], [place] [address]    Report Status PENDING  Incomplete  Urine culture     Status: None   Collection Time: 01/09/17  7:41 AM  Result Value Ref Range Status   Specimen Description URINE, CLEAN CATCH  Final   Special Requests Normal  Final   Culture   Final    NO GROWTH Performed at [facility], [place] [address], [place], [place] [address]    Report Status 01/10/2017 FINAL  Final  C difficile quick scan w PCR reflex     Status: None   Collection Time: 01/09/17 10:24 AM  Result Value Ref Range Status   C Diff antigen NEGATIVE NEGATIVE Final   C Diff toxin NEGATIVE NEGATIVE Final   C Diff interpretation No C. difficile detected.  Final  Gastrointestinal Panel by PCR , Stool     Status: None   Collection Time: 01/09/17 10:24 AM  Result Value Ref Range Status   Campylobacter species NOT DETECTED NOT DETECTED Final   Plesimonas shigelloides NOT DETECTED NOT DETECTED Final   Salmonella species NOT DETECTED NOT DETECTED Final   Yersinia enterocolitica NOT DETECTED NOT DETECTED Final   Vibrio species NOT DETECTED NOT DETECTED Final   Vibrio cholerae NOT DETECTED NOT DETECTED Final   Enteroaggregative E coli (EAEC) NOT DETECTED NOT DETECTED Final   Enteropathogenic E coli (EPEC) NOT DETECTED NOT DETECTED Final   Enterotoxigenic E coli (ETEC) NOT DETECTED NOT DETECTED Final   Shiga like toxin producing E coli (STEC) NOT DETECTED NOT DETECTED Final   Shigella/Enteroinvasive E coli (EIEC) NOT DETECTED NOT DETECTED Final   Cryptosporidium NOT DETECTED NOT DETECTED Final   Cyclospora  cayetanensis NOT DETECTED NOT DETECTED Final   Entamoeba histolytica NOT DETECTED NOT DETECTED Final   Giardia lamblia NOT DETECTED NOT DETECTED Final   Adenovirus F40/41 NOT DETECTED NOT DETECTED Final   Astrovirus NOT DETECTED NOT DETECTED Final   Norovirus GI/GII NOT DETECTED NOT DETECTED Final   Rotavirus A NOT DETECTED NOT DETECTED Final   Sapovirus (I, II, IV, and V) NOT DETECTED NOT DETECTED Final       Radiology Studies: Dg Abd 1 View  Result Date: 01/09/2017 CLINICAL DATA:  Left ureteral stone. EXAM: ABDOMEN - 1 VIEW COMPARISON:  CT scan earlier today. FINDINGS: The patient's known at 5 mm proximal left ureteral stone is not visible by x-ray. Bowel gas pattern is nonspecific. Surgical clips are noted in the central anatomic pelvis. IMPRESSION: Tiny proximal left ureteral stone not evident by x-ray. Electronically Signed   By: Misty Stanley M.D.   On: 01/09/2017 13:34   Mr Abdomen Wo Contrast  Result Date: 01/11/2017 CLINICAL DATA:  Inpatient. Indeterminate incidental left adrenal nodule on recent unenhanced CT abdomen/pelvis study performed for abdominal pain. EXAM: MRI ABDOMEN WITHOUT CONTRAST TECHNIQUE: Multiplanar multisequence MR imaging was performed without the administration of intravenous contrast. COMPARISON:  01/09/2017 unenhanced CT abdomen/ pelvis. FINDINGS: Lower chest: No acute abnormality at the lung bases. Hepatobiliary: Normal liver size and configuration. No hepatic steatosis. No liver mass. Normal gallbladder with no cholelithiasis. No biliary ductal dilatation. Common bile duct diameter 2 mm. No choledocholithiasis . Pancreas: No pancreatic mass or duct dilation.  No pancreas divisum. Spleen: Normal size. No mass. Adrenals/Urinary  Tract: Normal right adrenal. Left adrenal 1.7 cm nodule demonstrates low T1 and T2 signal intensity, with no evidence of significant signal loss on out of phase chemical shift imaging. No hydronephrosis. Normal kidneys with no renal mass.  Stomach/Bowel: Grossly normal stomach. Visualized small and large bowel is normal caliber, with no bowel wall thickening. Mild distal colonic diverticulosis. Vascular/Lymphatic: Atherosclerotic nonaneurysmal abdominal aorta. No pathologically enlarged lymph nodes in the abdomen. Other: No abdominal ascites or focal fluid collection. Musculoskeletal: No aggressive appearing focal osseous lesions. IMPRESSION: 1. Left adrenal 1.7 cm nodule remains indeterminate by noncontrast MRI evaluation. 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 Consider a follow-up noncontrast CT or MRI of the abdomen in 12 months. This recommendation follows ACR consensus guidelines: Management of Incidental Adrenal Masses: A White Paper of the ACR Incidental Findings Committee. J Am Coll Radiol 2017;14:1038-1044. 2. Mild distal colonic diverticulosis. Electronically Signed   By: Ilona Sorrel M.D.   On: 01/11/2017 09:04      Scheduled Meds: . feeding supplement  1 Container Oral TID BM  . feeding supplement (ENSURE ENLIVE)  237 mL Oral BID BM  . ferrous sulfate  325 mg Oral BID WC  . FLUoxetine  60 mg Oral QHS  . fluticasone  2 spray Each Nare Daily  . gabapentin  300 mg Oral TID  . lamoTRIgine  100 mg Oral QHS  . mirtazapine  45 mg Oral QHS  . pantoprazole (PROTONIX) IV  40 mg Intravenous Q12H  . saccharomyces boulardii  250 mg Oral BID  . sodium chloride flush  3 mL Intravenous Q12H   Continuous Infusions:    LOS: 2 days    Time spent: 30 minutes   Dessa Phi, DO [facility] [email or web address] Password [email or web address] 01/11/2017, 12:17 PM

## 2017-01-11 NOTE — Progress Notes (Signed)
Initial Nutrition Assessment  DOCUMENTATION CODES:   Non-severe (moderate) malnutrition in context of chronic illness, Obesity unspecified  INTERVENTION:    Continue Ensure Enlive po BID, each supplement provides 350 kcal and 20 grams of protein  NUTRITION DIAGNOSIS:   Malnutrition (moderate) related to chronic illness (CAD, depression, stroke) as evidenced by mild depletion of muscle mass, mild depletion of body fat  GOAL:   Patient will meet greater than or equal to 90% of their needs  MONITOR:   PO intake, Supplement acceptance, Labs, Weight trends, I & O's  REASON FOR ASSESSMENT:   Malnutrition Screening Tool  ASSESSMENT:   "71 year old male with a history of colon polyps, colonic diverticulosis, GERD, small bowel angiodysplastic lesions, kidney stone, spinal stenosis, chronic anemia, bipolar disorder, PTSD and history of TIA admitted with vague abdominal pain, leukocytosis, nausea, loose stools and concern for sepsis." Copied from [facility], [place], 01/10/17.  Pt anxious to have diet advanced (previously on Full Liquids); hungry. Reports he was eating well at home; eats 3 meals per day. Likes vanilla Ensure. Endorses a 25-30# weight loss, however, weights have been fluctuating per readings below. Medications reviewed and include Remeron and Florastor. Labs reviewed. CBG's [id number].  Nutrition-Focused physical exam completed. Findings are mild fat depletion, mild muscle depletion, and no edema.   Diet Order:  DIET SOFT Room service appropriate? Yes; Fluid consistency: Thin  Skin:  Reviewed, no issues  Last BM:  7/8  Height:   Ht Readings from Last 1 Encounters:  01/09/17 5\' 4"  (1.626 m)    Weight:   Wt Readings from Last 1 Encounters:  01/11/17 179 lb 0.2 oz (81.2 kg)   Wt Readings from Last 10 Encounters:  01/11/17 179 lb 0.2 oz (81.2 kg)  12/28/16 196 lb 10.4 oz (89.2 kg)  12/06/16 175 lb 0.7 oz (79.4 kg)  10/29/16 182 lb 15.7 oz (83 kg)   11/27/15 180 lb (81.6 kg)  10/17/15 173 lb 8 oz (78.7 kg)  09/01/15 176 lb (79.8 kg)  06/11/14 189 lb 9.6 oz (86 kg)  05/15/14 182 lb (82.6 kg)  05/09/14 185 lb (83.9 kg)    Ideal Body Weight:  59 kg  BMI:  Body mass index is 30.73 kg/m.  Estimated Nutritional Needs:   Kcal:  1700-1900  Protein:  80-95 gm  Fluid:  1.7-1.9 L  EDUCATION NEEDS:   No education needs identified at this time  Arthur Holms, RD, LDN Pager #: [phone] After-Hours Pager #: [phone]

## 2017-01-11 NOTE — Progress Notes (Signed)
[facility] Progress Note  Subjective:  Feels good.  Advanced to a regular diet so going to see how that goes for lunch.  Had a more normal BM this AM.  No abdominal pain.   Objective:  Vital signs in last 24 hours: Temp:  [97.9 F (36.6 C)-98.2 F (36.8 C)] 98 F (36.7 C) (07/10 0517) Pulse Rate:  [80-85] 83 (07/10 0517) Resp:  [18-20] 18 (07/10 0517) BP: (130-172)/(73-90) 172/90 (07/10 0517) SpO2:  [97 %-98 %] 97 % (07/10 0517) Weight:  [179 lb 0.2 oz (81.2 kg)] 179 lb 0.2 oz (81.2 kg) (07/10 0517) Last BM Date: 01/09/17 General:  Alert, Well-developed, in NAD Heart:  Regular rate and rhythm; no murmurs Pulm:  No increased WOB. Abdomen:  Soft, non-distended.  BS present.  Non-tender.   Extremities:  Without edema. Neurologic:  Alert and oriented x 4;  grossly normal neurologically. Psych:  Alert and cooperative. Normal mood and affect.  Intake/Output from previous day: 07/09 0701 - 07/10 0700 In: 2898.3 [P.O.:480; I.V.:2118.3; IV Piggyback:300] Out: 2175 [Urine:2175] Intake/Output this shift: Total I/O In: 961.7 [I.V.:761.7; IV Piggyback:200] Out: -   Lab Results:  Recent Labs  01/10/17 0447 01/10/17 1453 01/11/17 0517  WBC 7.2 7.4 7.2  HGB 9.4* 9.4* 9.9*  HCT 28.7* 28.1* 30.2*  PLT 279 270 276   BMET  Recent Labs  01/09/17 0326 01/10/17 0447 01/11/17 0517  NA 140 142 144  K 3.6 3.7 4.4  CL 106 114* 114*  CO2 22 23 24   GLUCOSE 128* 83 98  BUN 31* 16 10  CREATININE 3.39* 1.40*  1.42* 1.07  CALCIUM 8.3* 8.0* 8.7*   LFT  Recent Labs  01/10/17 0447  PROT 5.2*  ALBUMIN 3.0*  AST 15  ALT 15*  ALKPHOS 73  BILITOT 0.4   Dg Abd 1 View  Result Date: 01/09/2017 CLINICAL DATA:  Left ureteral stone. EXAM: ABDOMEN - 1 VIEW COMPARISON:  CT scan earlier today. FINDINGS: The patient's known at 5 mm proximal left ureteral stone is not visible by x-ray. Bowel gas pattern is nonspecific. Surgical clips are noted in the central anatomic  pelvis. IMPRESSION: Tiny proximal left ureteral stone not evident by x-ray. Electronically Signed   By: Misty Stanley M.D.   On: 01/09/2017 13:34   Mr Abdomen Wo Contrast  Result Date: 01/11/2017 CLINICAL DATA:  Inpatient. Indeterminate incidental left adrenal nodule on recent unenhanced CT abdomen/pelvis study performed for abdominal pain. EXAM: MRI ABDOMEN WITHOUT CONTRAST TECHNIQUE: Multiplanar multisequence MR imaging was performed without the administration of intravenous contrast. COMPARISON:  01/09/2017 unenhanced CT abdomen/ pelvis. FINDINGS: Lower chest: No acute abnormality at the lung bases. Hepatobiliary: Normal liver size and configuration. No hepatic steatosis. No liver mass. Normal gallbladder with no cholelithiasis. No biliary ductal dilatation. Common bile duct diameter 2 mm. No choledocholithiasis . Pancreas: No pancreatic mass or duct dilation.  No pancreas divisum. Spleen: Normal size. No mass. Adrenals/Urinary Tract: Normal right adrenal. Left adrenal 1.7 cm nodule demonstrates low T1 and T2 signal intensity, with no evidence of significant signal loss on out of phase chemical shift imaging. No hydronephrosis. Normal kidneys with no renal mass. Stomach/Bowel: Grossly normal stomach. Visualized small and large bowel is normal caliber, with no bowel wall thickening. Mild distal colonic diverticulosis. Vascular/Lymphatic: Atherosclerotic nonaneurysmal abdominal aorta. No pathologically enlarged lymph nodes in the abdomen. Other: No abdominal ascites or focal fluid collection. Musculoskeletal: No aggressive appearing focal osseous lesions. IMPRESSION: 1. Left adrenal 1.7 cm nodule remains  indeterminate by noncontrast MRI evaluation. 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 Consider a follow-up noncontrast CT or MRI of the abdomen in 12 months. This recommendation follows ACR consensus guidelines: Management of Incidental Adrenal  Masses: A White Paper of the ACR Incidental Findings Committee. J Am Coll Radiol 2017;14:1038-1044. 2. Mild distal colonic diverticulosis. Electronically Signed   By: Ilona Sorrel M.D.   On: 01/11/2017 09:04   Assessment / Plan: 1. Lower GI bleed: evaluated in ED (appears to be trauma from wiping), hgb from 11.4 to 9.4 but now has been stable and no sign of overt bleeding so suspect that it was a dilutional drop.  H/o recent perforation- patient scheduling EGD/Colonoscopy via [facility] in the near future. 2. Sepsis vs SIRS: ? Intra-abdominal etiology 3. Nausea, vomiting, diarrhea:  Resolved 4. AKI:  Resolved. 5. Elevated Lipase: no pancreatitis on CT, Lipase elevated in 500's, but quickly normalized. 6. 25mm ureteral stone: urology on board, planning to hold on tx for now 7. Spinal stenosis: scheduled for outpatient surgery with [facility] at the end of the week  *Unclear constellation of recent symptoms/findings. Intra-abdominal free air source unclear from late June 2018. No overt processes by recent CT scan of the abdomen and pelvis. No evidence of overt bleeding at present and Hgb is now stable.  We have recommended upper endoscopy and colonoscopy to evaluate recent symptoms but also recent concern of bowel perforation (and he also had colon polyps that were not removed on previous colonoscopy). Elevated lipase could be an early pancreatitis but also can be present an acute kidney injury and also peptic ulcer disease.  The patient prefers to have his GI evaluation after his spinal surgery, which should be ok if done in the near future. Spinal surgery is scheduled for later this week and he has waited a long time through the [place] [facility] to have this performed; still needs to meet with anesthesia/pre-op this week as well.  If tolerates regular diet then can likely be discharged later today.    LOS: 2 days   Dorie Ohms D.  01/11/2017, 9:47 AM  Pager number [phone]

## 2017-01-11 NOTE — Care Management Obs Status (Signed)
[facility] NOTIFICATION   Patient Details  Name: SHASHANK KWASNIK MRN: [id number] Date of Birth: [birth year]   Medicare Observation Status Notification Given:  Yes    Apolonio Schneiders, RN 01/11/2017, 6:14 PM

## 2017-01-11 NOTE — Progress Notes (Signed)
Pharmacy Antibiotic Note  Jason [name] is a 71 y.o. male admitted on 01/09/2017 with Intra-abdominal infection, sepsis, UTI.  Pharmacy has been consulted for Flagyl, levofloxacin dosing.  Today, 01/11/2017 SCr improved 3.39 > 1.42 > 1.07 CrCl now > 50 ml/min WBC improved to normal Afebrile  Plan: Increase Levaquin to 750mg  IV q24h for improved renal function Continue Flagyl 500mg  IV q8hr Follow up duration of antibiotics - MD notes state may consider stopping  Height: 5\' 4"  (162.6 cm) Weight: 179 lb 0.2 oz (81.2 kg) IBW/kg (Calculated) : 59.2  Temp (24hrs), Avg:98 F (36.7 C), Min:97.9 F (36.6 C), Max:98.2 F (36.8 C)   Recent Labs Lab 01/09/17 0326 01/09/17 0344 01/09/17 0806 01/10/17 0447 01/10/17 0530 01/10/17 1453 01/11/17 0517  WBC 16.1*  --   --  7.2  --  7.4 7.2  CREATININE 3.39*  --   --  1.40*  1.42*  --   --  1.07  LATICACIDVEN  --  2.13* 0.77  --  0.5  --   --     Estimated Creatinine Clearance: 62.7 mL/min (by C-G formula based on SCr of 1.07 mg/dL).    Allergies  Allergen Reactions  . Penicillins Other (See Comments)    Reaction:  Unknown  Has patient had a PCN reaction causing immediate rash, facial/tongue/throat swelling, SOB or lightheadedness with hypotension: Unknown Has patient had a PCN reaction causing severe rash involving mucus membranes or skin necrosis: Unknown Has patient had a PCN reaction that required hospitalization: Unknown Has patient had a PCN reaction occurring within the last 10 years: No If all of the above answers are "NO", then may proceed with Cephalosporin use.    Antimicrobials this admission:  7/8 Flagyl >> 7/8 Levofloxacin >>  Dose adjustments this admission:  7/10 increase levaquin from 750mg  q48h to q24h for improved renal function  Microbiology results:  7/8 BCx x2: ngtd 7/8 UCx: NGF 7/8 GI panel: neg 7/8 C.diff neg  Thank you for allowing pharmacy to be a part of this patient's care.  Peggyann Juba, PharmD, BCPS Pager: [phone] 01/11/2017 7:23 AM

## 2017-01-11 NOTE — Care Management CC44 (Signed)
Condition Code 44 Documentation Completed  Patient Details  Name: Jason [name] MRN: [id number] Date of Birth: May 18, 1946   Condition Code 44 given:  Yes Patient signature on Condition Code 44 notice:   YES Documentation of 2 MD's agreement: YES   Code 44 added to claim:   Starleen Arms, Percell Locus, RN 01/11/2017, 6:15 PM

## 2017-01-12 DIAGNOSIS — E785 Hyperlipidemia, unspecified: Secondary | ICD-10-CM | POA: Diagnosis not present

## 2017-01-12 DIAGNOSIS — E278 Other specified disorders of adrenal gland: Secondary | ICD-10-CM

## 2017-01-12 DIAGNOSIS — I10 Essential (primary) hypertension: Secondary | ICD-10-CM

## 2017-01-12 DIAGNOSIS — N201 Calculus of ureter: Secondary | ICD-10-CM | POA: Diagnosis not present

## 2017-01-12 DIAGNOSIS — F319 Bipolar disorder, unspecified: Secondary | ICD-10-CM

## 2017-01-12 DIAGNOSIS — N179 Acute kidney failure, unspecified: Secondary | ICD-10-CM | POA: Diagnosis not present

## 2017-01-12 DIAGNOSIS — R112 Nausea with vomiting, unspecified: Secondary | ICD-10-CM | POA: Diagnosis not present

## 2017-01-12 DIAGNOSIS — K219 Gastro-esophageal reflux disease without esophagitis: Secondary | ICD-10-CM | POA: Diagnosis not present

## 2017-01-12 DIAGNOSIS — I639 Cerebral infarction, unspecified: Secondary | ICD-10-CM

## 2017-01-12 LAB — CBC WITH DIFFERENTIAL/PLATELET
BASOS PCT: 1 %
Basophils Absolute: 0.1 10*3/uL (ref 0.0–0.1)
EOS ABS: 0.4 10*3/uL (ref 0.0–0.7)
Eosinophils Relative: 4 %
HEMATOCRIT: 32.9 % — AB (ref 39.0–52.0)
Hemoglobin: 10.9 g/dL — ABNORMAL LOW (ref 13.0–17.0)
Lymphocytes Relative: 17 %
Lymphs Abs: 1.4 10*3/uL (ref 0.7–4.0)
MCH: 29.9 pg (ref 26.0–34.0)
MCHC: 33.1 g/dL (ref 30.0–36.0)
MCV: 90.4 fL (ref 78.0–100.0)
MONO ABS: 0.8 10*3/uL (ref 0.1–1.0)
MONOS PCT: 9 %
Neutro Abs: 5.8 10*3/uL (ref 1.7–7.7)
Neutrophils Relative %: 69 %
Platelets: 310 10*3/uL (ref 150–400)
RBC: 3.64 MIL/uL — ABNORMAL LOW (ref 4.22–5.81)
RDW: 13.4 % (ref 11.5–15.5)
WBC: 8.5 10*3/uL (ref 4.0–10.5)

## 2017-01-12 LAB — BASIC METABOLIC PANEL
Anion gap: 8 (ref 5–15)
BUN: 15 mg/dL (ref 6–20)
CALCIUM: 9.1 mg/dL (ref 8.9–10.3)
CO2: 29 mmol/L (ref 22–32)
CREATININE: 1.03 mg/dL (ref 0.61–1.24)
Chloride: 105 mmol/L (ref 101–111)
GFR calc Af Amer: >60 mL/min (ref >60)
GFR calc non Af Amer: >60 mL/min (ref >60)
GLUCOSE: 97 mg/dL (ref 65–99)
Potassium: 4 mmol/L (ref 3.5–5.1)
Sodium: 142 mmol/L (ref 135–145)

## 2017-01-12 LAB — MAGNESIUM: MAGNESIUM: 1.4 mg/dL — AB (ref 1.7–2.4)

## 2017-01-12 LAB — METANEPHRINES, PLASMA
Metanephrine, Free: 29 pg/mL (ref 0–62)
NORMETANEPHRINE FREE: 116 pg/mL (ref 0–145)

## 2017-01-12 MED ORDER — LISINOPRIL 5 MG PO TABS: 5.0000 mg | ORAL_TABLET | Freq: Every day | ORAL | 1 refills | Status: DC

## 2017-01-12 MED ORDER — MAGNESIUM SULFATE 2 GM/50ML IV SOLN
2.0000 g | Freq: Once | INTRAVENOUS | Status: AC
  Administered 2017-01-12: 2 g via INTRAVENOUS
  Filled 2017-01-12: qty 50

## 2017-01-12 MED ORDER — GABAPENTIN 300 MG PO CAPS: 300.0000 mg | ORAL_CAPSULE | Freq: Three times a day (TID) | ORAL | 0 refills | Status: AC

## 2017-01-12 MED ORDER — FLUTICASONE PROPIONATE 50 MCG/ACT NA SUSP: 2.0000 | Freq: Every day | NASAL | 1 refills | Status: DC

## 2017-01-12 MED ORDER — SACCHAROMYCES BOULARDII 250 MG PO CAPS: 250.0000 mg | ORAL_CAPSULE | Freq: Two times a day (BID) | ORAL | Status: DC

## 2017-01-12 MED ORDER — ACETAMINOPHEN 325 MG PO TABS: 650.0000 mg | ORAL_TABLET | Freq: Four times a day (QID) | ORAL | 0 refills | Status: DC | PRN

## 2017-01-12 MED ORDER — PANTOPRAZOLE SODIUM 40 MG PO TBEC
40.0000 mg | DELAYED_RELEASE_TABLET | Freq: Two times a day (BID) | ORAL | Status: DC
  Administered 2017-01-12: 40 mg via ORAL
  Filled 2017-01-12: qty 1

## 2017-01-12 NOTE — Progress Notes (Signed)
Physical Therapy Treatment Patient Details Name: Jason [name] MRN: [id number] DOB: [birth year] Today's Date: 01/12/2017    History of Present Illness Pt is a 71 yo male with extensive PMhx including recurrent admissions for n/v and hypotension. Pt PMhx also includes stroke as well as AKI. Pt currently admitted for sepsis likely caused by AKI. Pt also complains of back pain and states he will be having surgery in the near future.     PT Comments    Pt in bed "crabby" because he thought he was going to get to go home.  "This is my second hospital stay" and "I was suppose to have back surgery".  Pt did agree to amb in hallway using a RW due to gait instability.  Pt and his spouse live with pt's sister in [place].  Prior pt was amb with a staright cane but admits lately he also has been holding to furniture.  "I can't get rid of this back pain until I have my surgery".    Follow Up Recommendations  No PT follow up     Equipment Recommendations  None recommended by PT    Recommendations for Other Services       Precautions / Restrictions Precautions Precautions: Fall Restrictions Weight Bearing Restrictions: No    Mobility  Bed Mobility Overal bed mobility: Modified Independent             General bed mobility comments: increased time  Transfers Overall transfer level: Needs assistance Equipment used: Rolling walker (2 wheeled);None Transfers: Sit to/from [facility] Sit to Stand: Supervision         General transfer comment: pt impulsive required 25% VC's on safety esp with turns   Ambulation/Gait Ambulation/Gait assistance: Min guard;Min assist Ambulation Distance (Feet): 85 Feet Assistive device: Rolling walker (2 wheeled) Gait Pattern/deviations: Step-through pattern;Decreased stride length Gait velocity: decreased   General Gait Details: unsteady gait using walker.  Prior pt stated he used his cane but admits to also holding to  furniture.    Stairs            Wheelchair Mobility    Modified Rankin (Stroke Patients Only)       Balance                                            Cognition Arousal/Alertness: Awake/alert Behavior During Therapy: WFL for tasks assessed/performed Overall Cognitive Status: Within Functional Limits for tasks assessed                                        Exercises      General Comments        Pertinent Vitals/Pain Pain Assessment: 0-10 Pain Score: 6  Pain Location: back Pain Descriptors / Indicators: Numbness;Aching;Constant Pain Intervention(s): Monitored during session;Premedicated before session;Repositioned    Home Living                      Prior Function            PT Goals (current goals can now be found in the care plan section) Progress towards PT goals: Progressing toward goals    Frequency    Min 3X/week      PT Plan Current plan remains appropriate    Co-evaluation  AM-PAC PT "6 Clicks" Daily Activity  Outcome Measure  Difficulty turning over in bed (including adjusting bedclothes, sheets and blankets)?: Total Difficulty moving from lying on back to sitting on the side of the bed? : Total Difficulty sitting down on and standing up from a chair with arms (e.g., wheelchair, bedside commode, etc,.)?: Total Help needed moving to and from a bed to chair (including a wheelchair)?: A Little Help needed walking in hospital room?: A Little Help needed climbing 3-5 steps with a railing? : A Lot 6 Click Score: 11    End of Session Equipment Utilized During Treatment: Gait belt Activity Tolerance: Patient tolerated treatment well Patient left: in bed;with call bell/phone within reach;with bed alarm set Nurse Communication: Mobility status PT Visit Diagnosis: Muscle weakness (generalized) (M62.81);Difficulty in walking, not elsewhere classified (R26.2)     Time:  0383-3383 PT Time Calculation (min) (ACUTE ONLY): 13 min  Charges:  $Gait Training: 8-22 mins                    G Codes:       Rica Koyanagi  PTA [facility]  Acute  Rehab Pager      [phone]

## 2017-01-12 NOTE — Progress Notes (Signed)
PHARMACIST - PHYSICIAN COMMUNICATION  DR:   Dyann Kief  CONCERNING: IV to Oral Route Change Policy  RECOMMENDATION: This patient is receiving Protonix by the intravenous route.  Based on criteria approved by the Pharmacy and Therapeutics Committee, the intravenous medication(s) is/are being converted to the equivalent oral dose form(s).   DESCRIPTION: These criteria include:  The patient is eating (either orally or via tube) and/or has been taking other orally administered medications for a least 24 hours  The patient has no evidence of active gastrointestinal bleeding or impaired GI absorption (gastrectomy, short bowel, patient on TNA or NPO).  If you have questions about this conversion, please contact the Pharmacy Department  []   [phone] )  [facility] []   [phone] )  [facility] []   [phone] )  [facility] []   [phone] )  [facility] [x]   [phone] )  [place], PharmD, BCPS Pager: [phone] 01/12/2017 9:52 AM

## 2017-01-12 NOTE — Plan of Care (Signed)
Problem: Pain Managment: Goal: General experience of comfort will improve Outcome: Progressing Pt with chronic back pain.  On Oxy IR at home C/o pain 7/10 aching to mid back.   4mg  morphine given. Will continue to monitor and treat prn.

## 2017-01-12 NOTE — Discharge Summary (Signed)
Physician Discharge Summary  Jason [name] UXN:[id number] DOB: 18-Nov-1945 DOA: 01/09/2017  PCP: System, Pcp Not In  Admit date: 01/09/2017 Discharge date: 01/12/2017  Time spent: 35 minutes  Recommendations for Outpatient Follow-up:  1. Repeat CBC to follow Hgb trend  2. Repeat BMET to follow electrolytes and renal function  3. Reassess BP and adjust antihypertensive regimen as needed  4. Please see below for recommendations regarding lung nodule follow up.   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 Renal function resolved and patient tolerating diet. Discharge home with instructions to follow up with PCP and GI as an outpatient.  Diet recommendation: heart healthy diet   Filed Weights   01/10/17 0559 01/11/17 0517 01/12/17 0635  Weight: 83.8 kg (184 lb 11.9 oz) 81.2 kg (179 lb 0.2 oz) 76.9 kg (169 lb 8.5 oz)    History of present illness:  As per H&P written by Dr. Posey Pronto on 01/09/17 71 y.o.malewith past medical history of GERD, bipolar disorder, chronic back pain, depression, dyslipidemia, lung nodule, PTSD with nightmares, CVA with residual deficit. Patient presented with complaints of abdominal pain. Patient was recently hospitalized for the same on 12/24/2016, had acute kidney injury with Nephrology consult and improvement in renal function, had some free air in the abdomen which was thought to be perforated peptic ulcer, diverticulum, Gen. surgery was consulted and conservative management was chosen. [facility]  After the discharge on 12/28/2016, patient developed recurrence of  nausea and vomiting 2 days ago as well as blood in his stool. He also had low BP in 70/40s with EMS. Apparently, blood pressure has been significantly labile at home. CT abdomen and pelvis was obtained on admission, showed 5 millimeter left ureter stone as well as diverticulosis and 1.7 cm left adrenal gland nodule. This left adrenal nodule was visualized on CT scan 6/23 and was deemed likely benign. However, with patient's history of labile blood pressure, hypertensive in the 200s with hypotension in the 70s, further workup was deemed necessary. Patient was admitted for further work up of renal failure, HTN, acute blood loss anemia and acute pancreatitis.  Hospital Course:  1-SIRS: No acute infection source was found. -Patient initially cover with IV antibiotics using Levaquin and Flagyl but no source of infection identified. -Antibiotics or discontinue and the patient was observed over 24 hours without any fever, elevation of white blood cells or any symptom/abnormality suggesting infection. -His SIRS features were completely resolve with IV fluid resuscitation. -patient advise to keep himself well hydrated  -C. Diff PCR neg; GI pathogen panel neg and no growth on blood/urine cultures  2-acute blood loss anemia with BRBPR, hx of GERD/peptic ulcer in the past and AVM's -no further signs of acute bleeding appreciated -patient discharged on PPI -per GI ok to resume enteric coated ASA -patient instructed to avoid NSAID's -Plavix has been discontinued  -will continue iron supplementation.  3-HTN -Patient with elevated blood pressure followed by hypertension; 1.7 cm left adrenal gland nodule and renal ultrasound with mild bilateral renal artery stenosis (1-59%). -Blood pressure has remained stable -Images studies suggested that the adrenal nodule  is a lipoma and recommendations are for repeat imaging studies in 12 months. Patient also with negative metanephrines in urine. Please follow  catecholamines final results which were pending at the moment of discharge. -Based on renal ultrasound findings will use low-dose lisinopril as part of the treatment for his high blood pressure. -Patient has been instructed to follow a heart healthy diet and to keep himself well hydrated. -please reassess BP and adjust medications as needed   3-AKI -unclear etiology; but presumed pre-renal in nature (with nausea, vomiting and diarrhea). -renal function resolved with IVF's -no evidence of hydronephrosis -advise to maintain himself well hydrated -close follow up of renal function recommended, especially after initiation of lisinopril again.  4-nephrolithiasis  -case evaluated by urology -recommendations given for outpatient follow up -advise to keep himself well hydrated   5-lower back pain/spinal stenosis -continue PRN pain meds and use of Neurontin -follow as an outpatient for upcoming laminectomy   6-bipolar disorder -continue Prozac, Lamictal, Remeron   7-PTSD with nightmares -will resume Prazosin   8-idiopathic pancreatitis  -Lipase was elevated -resolved with IVF's and supportive care -lipid panel normal -no abnormalities seen on gallbladder   9-hx of CVA -no new neurologic deficit appreciated -will resume Aspirin for secondary prevention  -continue risk factors modifications   10-lung nodule -patient with 2 nodules in right lung apex -recommendations given around June is for repeat CT scan in 3-6 months; and if not abnormalities/changes seen, then to repeat CT scan in 12-24 months.   Procedures:  See below for x-ray reports  Renal ultrasound: With mild bilateral renal artery stenosis (1-59%).  Consultations:  GI  Urologyr  Discharge Exam: Vitals:   01/12/17 1507 01/12/17 1538  BP: (!) 176/85 (!) 102/52  Pulse: 84   Resp:    Temp: 98.2 F (36.8 C)     General: Afebrile, denies chest pain, no shortness of breath. Patient endorses no further nausea,  vomiting, dysuria or abdominal pain. He continuous expressing significant lower back discomfort. Cardiovascular: S1 and S2, no rubs, no gallops, no murmur, no JVD. Respiratory: Good air movement bilaterally, no wheezing, no crackles, no using accessory muscles. Abdomen: Soft, nontender, positive bowel sounds, no guarding Extremities: No edema, no cyanosis, no clubbing.  Discharge Instructions   Discharge Instructions    Diet - low sodium heart healthy    Complete by:  As directed    Discharge instructions    Complete by:  As directed    Stop use of Afrin; ok to use saline spray Use Flonase as prescribed Please arrange follow up with PCP in 1 week Follow heart healthy diet and maintain adequate hydration   Increase activity slowly    Complete by:  As directed      Current Discharge Medication List    START taking these medications   Details  acetaminophen (TYLENOL) 325 MG tablet Take 2 tablets (650 mg total) by mouth every 6 (six) hours as needed for mild pain or moderate pain. Qty: 40 tablet, Refills: 0    fluticasone (FLONASE) 50 MCG/ACT nasal spray Place 2 sprays into both nostrils daily. Qty: 16 g, Refills: 1    gabapentin (NEURONTIN) 300 MG capsule Take 1 capsule (300 mg total) by mouth 3 (three) times daily. Qty: 90 capsule, Refills: 0    lisinopril (PRINIVIL,ZESTRIL) 5 MG tablet Take 1 tablet (5 mg total) by mouth daily. Qty: 30 tablet, Refills: 1    saccharomyces boulardii (FLORASTOR) 250 MG capsule Take 1 capsule (250 mg total) by mouth  2 (two) times daily.      CONTINUE these medications which have NOT CHANGED   Details  aspirin EC 325 MG tablet Take 325 mg by mouth at bedtime.    atorvastatin (LIPITOR) 40 MG tablet Take 1 tablet (40 mg total) by mouth daily at 6 PM. Qty: 30 tablet, Refills: 0    calcium carbonate (OSCAL) 1500 (600 Ca) MG TABS tablet Take 1,200 mg of elemental calcium by mouth at bedtime.    Carboxymethylcellulose Sodium (THERATEARS OP)  Place 1 drop into both eyes 4 (four) times daily as needed (for dry eyes).     ferrous sulfate 325 (65 FE) MG tablet Take 1 tablet (325 mg total) by mouth 2 (two) times daily with a meal. Qty: 60 tablet, Refills: 0    FLUoxetine (PROZAC) 20 MG capsule Take 60 mg by mouth at bedtime.     Glucosamine Sulfate 1000 MG CAPS Take 2,000 mg by mouth at bedtime.     lamoTRIgine (LAMICTAL) 25 MG tablet Take 100 mg by mouth at bedtime.     mirtazapine (REMERON) 45 MG tablet Take 45 mg by mouth at bedtime.    omega-3 acid ethyl esters (LOVAZA) 1 g capsule Take 2 g by mouth at bedtime.    Oxycodone HCl 10 MG TABS Take 10 mg by mouth 3 (three) times daily as needed (for pain).    pantoprazole (PROTONIX) 40 MG tablet Take 1 tablet (40 mg total) by mouth 2 (two) times daily before a meal. Qty: 60 tablet, Refills: 3    prazosin (MINIPRESS) 1 MG capsule Take 3 mg by mouth at bedtime.    promethazine (PHENERGAN) 12.5 MG tablet Take 1 tablet (12.5 mg total) by mouth every 6 (six) hours as needed for nausea or vomiting. Qty: 30 tablet, Refills: 0      STOP taking these medications     amLODipine (NORVASC) 5 MG tablet      cefUROXime (CEFTIN) 500 MG tablet      gabapentin (NEURONTIN) 600 MG tablet      metroNIDAZOLE (FLAGYL) 500 MG tablet      oxymetazoline (AFRIN) 0.05 % nasal spray        Allergies  Allergen Reactions  . Penicillins Other (See Comments)    Reaction:  Unknown  Has patient had a PCN reaction causing immediate rash, facial/tongue/throat swelling, SOB or lightheadedness with hypotension: Unknown Has patient had a PCN reaction causing severe rash involving mucus membranes or skin necrosis: Unknown Has patient had a PCN reaction that required hospitalization: Unknown Has patient had a PCN reaction occurring within the last 10 years: No If all of the above answers are "NO", then may proceed with Cephalosporin use.     The results of significant diagnostics from this  hospitalization (including imaging, microbiology, ancillary and laboratory) are listed below for reference.    Significant Diagnostic Studies: Ct Abdomen Pelvis Wo Contrast  Result Date: 01/09/2017 CLINICAL DATA:  Acute onset of generalized abdominal pain, nausea and diarrhea. Initial encounter. EXAM: CT ABDOMEN AND PELVIS WITHOUT CONTRAST TECHNIQUE: Multidetector CT imaging of the abdomen and pelvis was performed following the standard protocol without IV contrast. COMPARISON:  CT of the abdomen and pelvis performed 12/25/2016, and renal ultrasound performed 12/24/2016. MRI of the lumbar spine performed 05/25/2014 FINDINGS: Lower chest: The visualized lung bases are grossly clear. The visualized portions of the mediastinum are unremarkable. Hepatobiliary: The liver is unremarkable in appearance. The gallbladder is unremarkable in appearance. The common bile duct remains normal in  caliber. Pancreas: The pancreas is within normal limits. Spleen: The spleen is unremarkable in appearance. Adrenals/Urinary Tract: A 1.7 cm nodule is noted at the left adrenal gland. The right adrenal gland is unremarkable in appearance. Nonspecific perinephric stranding is noted bilaterally. There is a 5 mm stone at the proximal left ureter, just below the left renal pelvis, without significant hydronephrosis. This likely causes intermittent obstruction. No nonobstructing renal stones are identified. Stomach/Bowel: The stomach is unremarkable in appearance. The small bowel is within normal limits. The appendix is normal in caliber, without evidence of appendicitis. The colon is unremarkable in appearance. Scattered diverticulosis is noted along the entirety of the colon, without evidence of diverticulitis. Vascular/Lymphatic: Scattered calcification is seen along the abdominal aorta and its branches. The abdominal aorta is otherwise grossly unremarkable. The inferior vena cava is grossly unremarkable. No retroperitoneal  lymphadenopathy is seen. No pelvic sidewall lymphadenopathy is identified. Reproductive: The bladder is mildly distended and grossly unremarkable. The patient is status post prostatectomy. Other: No additional soft tissue abnormalities are seen. Musculoskeletal: No acute osseous abnormalities are identified. Vacuum phenomenon is noted at L4-L5, with underlying facet disease along the lumbar spine. The visualized musculature is unremarkable in appearance. IMPRESSION: 1. 5 mm stone noted at the proximal left ureter, just below the left renal pelvis, without significant hydronephrosis. This likely causes intermittent obstruction. 2. Scattered diverticulosis along the entirety of the colon, without evidence of diverticulitis. 3. Scattered aortic atherosclerosis. 4. 1.7 cm nonspecific nodule at the left adrenal gland. Would correlate with adrenal labs, and consider adrenal protocol MRI or CT for further evaluation. Electronically Signed   By: Garald Balding M.D.   On: 01/09/2017 06:13   Ct Abdomen Pelvis Wo Contrast  Result Date: 12/25/2016 CLINICAL DATA:  Patient has N/V/D for about 1 week with fever and possible sepsis. Per patient he does not have any complaints excepted he is real weak, looking for source of infection" EXAM: CT CHEST, ABDOMEN AND PELVIS WITHOUT CONTRAST TECHNIQUE: Multidetector CT imaging of the chest, abdomen and pelvis was performed following the standard protocol without IV contrast. COMPARISON:  None. FINDINGS: CT CHEST FINDINGS Cardiovascular: Scattered atherosclerosis of the normal caliber thoracic aorta. Heart size is normal. No pericardial effusion. Coronary artery calcifications noted. Mediastinum/Nodes: No mass or enlarged lymph nodes within the mediastinum or perihilar regions. Esophagus is unremarkable. Trachea and central bronchi are unremarkable. Lungs/Pleura: 1 cm nodular consolidation in the right lung apex (series 5, image 25). 1.4 cm nodular consolidation at the right lung apex,  most likely nodular scarring/atelectasis as it is contiguous with adjacent linear scarring/atelectasis (image 114). Left lung is clear. No pleural effusion or pneumothorax. Musculoskeletal: Mild degenerative spurring within the thoracic spine. No acute or suspicious osseous finding. Superficial soft tissues are unremarkable. CT ABDOMEN PELVIS FINDINGS Hepatobiliary: No focal liver abnormality is seen. No gallstones, gallbladder wall thickening, or biliary dilatation. Pancreas: Unremarkable. No pancreatic ductal dilatation or surrounding inflammatory changes. Spleen: Normal in size without focal abnormality. Adrenals/Urinary Tract: Left adrenal mass measuring 1.9 cm, Hounsfield unit measurement of 21. Right adrenal gland appears normal. Kidneys appear normal without mass, stone or hydronephrosis. 2 mm stone within the proximal left ureter. Bladder is decompressed. Stomach/Bowel: Bowel is normal in caliber. Extensive diverticulosis of the sigmoid colon. Moderate degree of diverticulosis throughout the remainder of the colon. Vascular/Lymphatic: Aortic atherosclerosis. No enlarged lymph nodes seen. Reproductive: Status post prostatectomy. Other: Small foci of free intraperitoneal air within the lower right abdomen. Trace free fluid in the right paracolic gutter.  Musculoskeletal: Degenerative changes throughout the thoracolumbar spine, mild to moderate in degree, with prominent degenerative facet arthropathies in the lower lumbar spine contributing to neural foramen narrowings at the L4-5 and L5-S1 levels bilaterally. IMPRESSION: 1. Small foci of free intraperitoneal air within the right lower abdomen, indicating bowel perforation. The source of the free intraperitoneal air is unclear, most likely related to perforated diverticulum within the adjacent right colon given its location and the trace free fluid in the right paracolic gutter, but no convincing diverticulitis is identified. Of note, the adjacent small bowel in  the right abdomen appears normal. Another possible source for the free air would be the stomach as there is suggestion of gastric wall thickening at the level of the antrum and perigastric inflammation. The free air, however, is a fairly large distance from the stomach which makes the stomach a somewhat less likely source. 2. Diffuse colonic diverticulosis, most prominent within the sigmoid colon. 3. 2 mm nonobstructing left ureteral stone. Associated hydronephrosis. 4. Nodular consolidations within the right upper lobe and right lower lobe. 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 These are less likely acute pneumonia or neoplastic process. Non-contrast chest CT at 3-6 months is recommended. If the nodules are stable at time of repeat CT, then future CT at 18-24 months (from today's scan) is considered optional for low-risk patients, but is recommended for high-risk patients. This recommendation follows the consensus statement: Guidelines for Management of Incidental Pulmonary Nodules Detected on CT Images: From the Fleischner Society 2017; Radiology 2017; 284:228-243. 5. **An incidental finding of potential clinical significance has been found. Left adrenal mass measuring 1.9 cm, with Hounsfield unit measurement not compatible with benign lipid rich adrenal adenoma based on this noncontrast exam. Per consensus guidelines, this is a probably benign finding for which a follow-up adrenal protocol CT is recommended in 12 months.** 6. Aortic atherosclerosis. 7. 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 If any associated radiculopathic symptoms, would consider nonemergent lumbar spine MRI to exclude associated nerve root impingement. At minimum, recommend close clinical follow-up and short-term follow-up CT to ensure stability or  resolution of the acute/significant findings detailed above. Critical Value/emergent results were called by telephone at the time of interpretation on 12/25/2016 at 7:11 pm to Dr. Nira Conn RAI , who verbally acknowledged these results. Electronically Signed   By: Franki Cabot M.D.   On: 12/25/2016 19:31   Dg Abd 1 View  Result Date: 01/09/2017 CLINICAL DATA:  Left ureteral stone. EXAM: ABDOMEN - 1 VIEW COMPARISON:  CT scan earlier today. FINDINGS: The patient's known at 5 mm proximal left ureteral stone is not visible by x-ray. Bowel gas pattern is nonspecific. Surgical clips are noted in the central anatomic pelvis. IMPRESSION: Tiny proximal left ureteral stone not evident by x-ray. Electronically Signed   By: Misty Stanley M.D.   On: 01/09/2017 13:34   Ct Head Wo Contrast  Result Date: 12/24/2016 CLINICAL DATA:  Confusion, sepsis, slurred speech EXAM: CT HEAD WITHOUT CONTRAST TECHNIQUE: Contiguous axial images were obtained from the base of the skull through the vertex without intravenous contrast. COMPARISON:  12/06/2016 FINDINGS: Brain: Stable brain atrophy pattern with chronic white matter microvascular ischemic changes throughout the cerebral hemispheres. Remote bilateral lacunar-type basilar ganglia infarctions and right cerebellar infarct. No acute intracranial hemorrhage, mass lesion, new infarction, focal mass effect or edema. Negative for herniation, midline  shift, hydrocephalus, or extra-axial fluid collection. Cisterns are patent. Vascular: Atherosclerosis of the intracranial vessels. No hyperdense vessel demonstrated. Skull: Normal. Negative for fracture or focal lesion. Sinuses/Orbits: No acute finding. Other: None. IMPRESSION: Stable brain atrophy, chronic white matter microvascular ischemic changes and remote infarcts as above. No interval change or acute process by noncontrast CT. Electronically Signed   By: Jerilynn Mages.  Shick M.D.   On: 12/24/2016 07:53   Ct Chest Wo Contrast  Result Date:  12/25/2016 CLINICAL DATA:  Patient has N/V/D for about 1 week with fever and possible sepsis. Per patient he does not have any complaints excepted he is real weak, looking for source of infection" EXAM: CT CHEST, ABDOMEN AND PELVIS WITHOUT CONTRAST TECHNIQUE: Multidetector CT imaging of the chest, abdomen and pelvis was performed following the standard protocol without IV contrast. COMPARISON:  None. FINDINGS: CT CHEST FINDINGS Cardiovascular: Scattered atherosclerosis of the normal caliber thoracic aorta. Heart size is normal. No pericardial effusion. Coronary artery calcifications noted. Mediastinum/Nodes: No mass or enlarged lymph nodes within the mediastinum or perihilar regions. Esophagus is unremarkable. Trachea and central bronchi are unremarkable. Lungs/Pleura: 1 cm nodular consolidation in the right lung apex (series 5, image 25). 1.4 cm nodular consolidation at the right lung apex, most likely nodular scarring/atelectasis as it is contiguous with adjacent linear scarring/atelectasis (image 114). Left lung is clear. No pleural effusion or pneumothorax. Musculoskeletal: Mild degenerative spurring within the thoracic spine. No acute or suspicious osseous finding. Superficial soft tissues are unremarkable. CT ABDOMEN PELVIS FINDINGS Hepatobiliary: No focal liver abnormality is seen. No gallstones, gallbladder wall thickening, or biliary dilatation. Pancreas: Unremarkable. No pancreatic ductal dilatation or surrounding inflammatory changes. Spleen: Normal in size without focal abnormality. Adrenals/Urinary Tract: Left adrenal mass measuring 1.9 cm, Hounsfield unit measurement of 21. Right adrenal gland appears normal. Kidneys appear normal without mass, stone or hydronephrosis. 2 mm stone within the proximal left ureter. Bladder is decompressed. Stomach/Bowel: Bowel is normal in caliber. Extensive diverticulosis of the sigmoid colon. Moderate degree of diverticulosis throughout the remainder of the colon.  Vascular/Lymphatic: Aortic atherosclerosis. No enlarged lymph nodes seen. Reproductive: Status post prostatectomy. Other: Small foci of free intraperitoneal air within the lower right abdomen. Trace free fluid in the right paracolic gutter. Musculoskeletal: Degenerative changes throughout the thoracolumbar spine, mild to moderate in degree, with prominent degenerative facet arthropathies in the lower lumbar spine contributing to neural foramen narrowings at the L4-5 and L5-S1 levels bilaterally. IMPRESSION: 1. Small foci of free intraperitoneal air within the right lower abdomen, indicating bowel perforation. The source of the free intraperitoneal air is unclear, most likely related to perforated diverticulum within the adjacent right colon given its location and the trace free fluid in the right paracolic gutter, but no convincing diverticulitis is identified. Of note, the adjacent small bowel in the right abdomen appears normal. Another possible source for the free air would be the stomach as there is suggestion of gastric wall thickening at the level of the antrum and perigastric inflammation. The free air, however, is a fairly large distance from the stomach which makes the stomach a somewhat less likely source. 2. Diffuse colonic diverticulosis, most prominent within the sigmoid colon. 3. 2 mm nonobstructing left ureteral stone. Associated hydronephrosis. 4. Nodular consolidations within the right upper lobe and right lower lobe. 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 These are less likely acute pneumonia or neoplastic process. Non-contrast chest CT at 3-6 months is recommended. If the nodules are stable at time of  repeat CT, then future CT at 18-24 months (from today's scan) is considered optional for low-risk patients, but is recommended for high-risk patients. This recommendation follows the consensus statement: Guidelines for Management of Incidental Pulmonary Nodules  Detected on CT Images: From the Fleischner Society 2017; Radiology 2017; 284:228-243. 5. **An incidental finding of potential clinical significance has been found. Left adrenal mass measuring 1.9 cm, with Hounsfield unit measurement not compatible with benign lipid rich adrenal adenoma based on this noncontrast exam. Per consensus guidelines, this is a probably benign finding for which a follow-up adrenal protocol CT is recommended in 12 months.** 6. Aortic atherosclerosis. 7. 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 If any associated radiculopathic symptoms, would consider nonemergent lumbar spine MRI to exclude associated nerve root impingement. At minimum, recommend close clinical follow-up and short-term follow-up CT to ensure stability or resolution of the acute/significant findings detailed above. Critical Value/emergent results were called by telephone at the time of interpretation on 12/25/2016 at 7:11 pm to Dr. Nira Conn RAI , who verbally acknowledged these results. Electronically Signed   By: Franki Cabot M.D.   On: 12/25/2016 19:31   Mr Abdomen Wo Contrast  Result Date: 01/11/2017 CLINICAL DATA:  Inpatient. Indeterminate incidental left adrenal nodule on recent unenhanced CT abdomen/pelvis study performed for abdominal pain. EXAM: MRI ABDOMEN WITHOUT CONTRAST TECHNIQUE: Multiplanar multisequence MR imaging was performed without the administration of intravenous contrast. COMPARISON:  01/09/2017 unenhanced CT abdomen/ pelvis. FINDINGS: Lower chest: No acute abnormality at the lung bases. Hepatobiliary: Normal liver size and configuration. No hepatic steatosis. No liver mass. Normal gallbladder with no cholelithiasis. No biliary ductal dilatation. Common bile duct diameter 2 mm. No choledocholithiasis . Pancreas: No pancreatic mass or duct dilation.  No  pancreas divisum. Spleen: Normal size. No mass. Adrenals/Urinary Tract: Normal right adrenal. Left adrenal 1.7 cm nodule demonstrates low T1 and T2 signal intensity, with no evidence of significant signal loss on out of phase chemical shift imaging. No hydronephrosis. Normal kidneys with no renal mass. Stomach/Bowel: Grossly normal stomach. Visualized small and large bowel is normal caliber, with no bowel wall thickening. Mild distal colonic diverticulosis. Vascular/Lymphatic: Atherosclerotic nonaneurysmal abdominal aorta. No pathologically enlarged lymph nodes in the abdomen. Other: No abdominal ascites or focal fluid collection. Musculoskeletal: No aggressive appearing focal osseous lesions. IMPRESSION: 1. Left adrenal 1.7 cm nodule remains indeterminate by noncontrast MRI evaluation. 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 Consider a follow-up noncontrast CT or MRI of the abdomen in 12 months. This recommendation follows ACR consensus guidelines: Management of Incidental Adrenal Masses: A White Paper of the ACR Incidental Findings Committee. J Am Coll Radiol 2017;14:1038-1044. 2. Mild distal colonic diverticulosis. Electronically Signed   By: Ilona Sorrel M.D.   On: 01/11/2017 09:04   US Renal  Result Date: 12/24/2016 CLINICAL DATA:  Acute kidney injury. EXAM: RENAL / URINARY TRACT ULTRASOUND COMPLETE COMPARISON:  None. FINDINGS: Right Kidney: Length: 10.6 cm. Echogenicity within normal limits. No mass or hydronephrosis visualized. Left Kidney: Length: 10.4 cm. Echogenicity within normal limits. No mass or hydronephrosis visualized. Bladder: Appears normal for degree of bladder distention. IMPRESSION: Negative for hydronephrosis.  Normal exam. Electronically Signed   By: Inge Rise M.D.   On: 12/24/2016 12:05   Dg Chest Port 1 View  Result Date: 12/25/2016 CLINICAL DATA:  Sepsis. EXAM: PORTABLE CHEST  1 VIEW COMPARISON:  12/24/2016  and 10/29/2016 FINDINGS: Heart size and pulmonary vascularity are normal. Minimal atelectasis at both lung bases laterally. The lungs are otherwise clear. No bone abnormality. IMPRESSION: Minimal atelectasis at both lung bases. Electronically Signed   By: Lorriane Shire M.D.   On: 12/25/2016 10:19   Dg Chest Port 1 View  Result Date: 12/24/2016 CLINICAL DATA:  Confusion EXAM: PORTABLE CHEST 1 VIEW COMPARISON:  10/29/2016 FINDINGS: Normal heart size. Lungs are under aerated. Central and basilar airspace disease. Vascular congestion. No pneumothorax. IMPRESSION: There is vascular congestion with central basilar hazy airspace disease. Electronically Signed   By: Marybelle Killings M.D.   On: 12/24/2016 11:32   Dg Abd Portable 1v  Result Date: 12/26/2016 CLINICAL DATA:  Painful acid reflux. EXAM: PORTABLE ABDOMEN - 1 VIEW COMPARISON:  CT scan from yesterday FINDINGS: The lung bases are normal. Evaluation for free air is limited on supine imaging but none is seen. Colonic diverticulosis is seen. No other abnormalities. IMPRESSION: No acute abnormalities identified. The free air seen on the CT scan from yesterday is not visualized but today's study is limited due to supine imaging. Electronically Signed   By: Dorise Bullion III M.D   On: 12/26/2016 16:12   Dg Abd Portable 1v  Result Date: 12/25/2016 CLINICAL DATA:  71 year old male on with bowel perforation. EXAM: PORTABLE ABDOMEN - 1 VIEW COMPARISON:  Abdominal CT dated 12/25/2016 FINDINGS: Oral contrast from prior CT is noted within the distal small bowel and colon. There is no bowel dilatation or evidence of obstruction. No large free air identified. Prostatectomy surgical clips noted within the pelvis. There is degenerative changes of the lumbar spine. No acute osseous pathology. IMPRESSION: 1. No bowel dilatation or evidence of obstruction. 2. No large free air. Electronically Signed   By: Anner Crete M.D.   On: 12/25/2016 19:46     Microbiology: Recent Results (from the past 240 hour(s))  Blood culture (routine x 2)     Status: None (Preliminary result)   Collection Time: 01/09/17  3:45 AM  Result Value Ref Range Status   Specimen Description BLOOD BLOOD RIGHT HAND  Final   Special Requests   Final    BOTTLES DRAWN AEROBIC AND ANAEROBIC Blood Culture adequate volume   Culture   Final    NO GROWTH 3 DAYS Performed at [facility], [address] [place], [place] [address]    Report Status PENDING  Incomplete  Blood culture (routine x 2)     Status: None (Preliminary result)   Collection Time: 01/09/17  4:05 AM  Result Value Ref Range Status   Specimen Description BLOOD BLOOD LEFT HAND  Final   Special Requests IN PEDIATRIC BOTTLE Blood Culture adequate volume  Final   Culture   Final    NO GROWTH 3 DAYS Performed at [facility], [place] [address], [place], [place] [address]    Report Status PENDING  Incomplete  Urine culture     Status: None   Collection Time: 01/09/17  7:41 AM  Result Value Ref Range Status   Specimen Description URINE, CLEAN CATCH  Final   Special Requests Normal  Final   Culture   Final    NO GROWTH Performed at [facility], [place] [address] [place], [place] [address]    Report Status 01/10/2017 FINAL  Final  C difficile quick scan w PCR reflex     Status: None   Collection Time: 01/09/17 10:24  AM  Result Value Ref Range Status   C Diff antigen NEGATIVE NEGATIVE Final   C Diff toxin NEGATIVE NEGATIVE Final   C Diff interpretation No C. difficile detected.  Final  Gastrointestinal Panel by PCR , Stool     Status: None   Collection Time: 01/09/17 10:24 AM  Result Value Ref Range Status   Campylobacter species NOT DETECTED NOT DETECTED Final   Plesimonas shigelloides NOT DETECTED NOT DETECTED Final   Salmonella species NOT DETECTED NOT DETECTED Final   Yersinia enterocolitica NOT DETECTED NOT DETECTED Final   Vibrio species NOT DETECTED NOT DETECTED Final    Vibrio cholerae NOT DETECTED NOT DETECTED Final   Enteroaggregative E coli (EAEC) NOT DETECTED NOT DETECTED Final   Enteropathogenic E coli (EPEC) NOT DETECTED NOT DETECTED Final   Enterotoxigenic E coli (ETEC) NOT DETECTED NOT DETECTED Final   Shiga like toxin producing E coli (STEC) NOT DETECTED NOT DETECTED Final   Shigella/Enteroinvasive E coli (EIEC) NOT DETECTED NOT DETECTED Final   Cryptosporidium NOT DETECTED NOT DETECTED Final   Cyclospora cayetanensis NOT DETECTED NOT DETECTED Final   Entamoeba histolytica NOT DETECTED NOT DETECTED Final   Giardia lamblia NOT DETECTED NOT DETECTED Final   Adenovirus F40/41 NOT DETECTED NOT DETECTED Final   Astrovirus NOT DETECTED NOT DETECTED Final   Norovirus GI/GII NOT DETECTED NOT DETECTED Final   Rotavirus A NOT DETECTED NOT DETECTED Final   Sapovirus (I, II, IV, and V) NOT DETECTED NOT DETECTED Final     Labs: Basic Metabolic Panel:  Recent Labs Lab 01/09/17 0326 01/10/17 0447 01/11/17 0517 01/12/17 0505  NA 140 142 144 142  K 3.6 3.7 4.4 4.0  CL 106 114* 114* 105  CO2 22 23 24 29   GLUCOSE 128* 83 98 97  BUN 31* 16 10 15   CREATININE 3.39* 1.40*  1.42* 1.07 1.03  CALCIUM 8.3* 8.0* 8.7* 9.1  MG  --  1.4*  --  1.4*   Liver Function Tests:  Recent Labs Lab 01/09/17 0326 01/10/17 0447  AST 22 15  ALT 19 15*  ALKPHOS 84 73  BILITOT 0.1* 0.4  PROT 6.6 5.2*  ALBUMIN 3.7 3.0*    Recent Labs Lab 01/09/17 0326 01/10/17 0447  LIPASE 580* 35   CBC:  Recent Labs Lab 01/09/17 0326 01/10/17 0447 01/10/17 1453 01/11/17 0517 01/12/17 0505  WBC 16.1* 7.2 7.4 7.2 8.5  NEUTROABS  --  4.9  --  4.9 5.8  HGB 11.4* 9.4* 9.4* 9.9* 10.9*  HCT 35.7* 28.7* 28.1* 30.2* 32.9*  MCV 93.7 92.9 92.1 92.1 90.4  PLT 319 279 270 276 310   Cardiac Enzymes:  Recent Labs Lab 01/10/17 0447  CKTOTAL 41*    Signed:  Barton Dubois MD.  [facility] 01/12/2017, 5:57 PM

## 2017-01-13 LAB — CATECHOLAMINES,UR.,FREE,24 HR
Dopamine, Rand Ur: 94 ug/L
Dopamine, Ur, 24Hr: 263 ug/24 hr (ref 0–510)
EPINEPHRINE, U, 24HR: 3 ug/(24.h) (ref 0–20)
Epinephrine, Rand Ur: 1 ug/L
NOREPINEPH RAND UR: 13 ug/L
NOREPINEPHRINE,U,24H: 36 ug/(24.h) (ref 0–135)
Total Volume: 2800

## 2017-01-13 NOTE — Progress Notes (Addendum)
G-Codes For PT Evaluation    01/10/17 1238  PT Time Calculation  PT Start Time (ACUTE ONLY) 1012  PT Stop Time (ACUTE ONLY) 1031  PT Time Calculation (min) (ACUTE ONLY) 19 min  PT G-Codes **NOT FOR INPATIENT CLASS**  Functional Assessment Tool Used AM-PAC 6 Clicks Basic Mobility;Clinical judgement  Functional Limitation Mobility: Walking and moving around  Mobility: Walking and Moving Around Current Status (Z0017) CJ  Mobility: Walking and Moving Around Goal Status (C9449) [facility]  PT General Charges  $$ ACUTE PT VISIT 1 Procedure  PT Evaluation  $PT Eval Low Complexity 1 Procedure   Carmelia Bake, PT, DPT 01/13/2017 Pager: [phone]

## 2017-01-14 LAB — CULTURE, BLOOD (ROUTINE X 2)
CULTURE: NO GROWTH
CULTURE: NO GROWTH
SPECIAL REQUESTS: ADEQUATE
Special Requests: ADEQUATE

## 2017-06-01 ENCOUNTER — Telehealth: Payer: Self-pay | Admitting: Gastroenterology

## 2017-06-01 ENCOUNTER — Encounter: Payer: Self-pay | Admitting: Gastroenterology

## 2017-06-01 NOTE — Telephone Encounter (Signed)
I have not seen him in over a year. It was recommended that he have a repeat colonoscopy due to the few small polyps noted on his initial colonoscopy that were not removed in the setting of GI bleed and plavix use.   I think it is okay to book him for a colonoscopy to be done at the [facility]. He was not on plavix the last I saw him. If that is still the case we can direct book. If he is on plavix this will need to be held for 5 days prior to the procedure. He can continue aspirin or take in place of plavix should he need to stop plavix. thanks

## 2017-06-01 NOTE — Telephone Encounter (Signed)
Spoke with patient states he was taken off of Plavix and not taking any other blood thinners other than Aspirin. Patient has been scheduled for a colonoscopy in [place] with Dr.Armbruster.

## 2017-06-01 NOTE — Telephone Encounter (Signed)
Routed to Dr. [name] to review chart.

## 2017-06-01 NOTE — Telephone Encounter (Signed)
Please schedule for [facility], let me know if on blood thinner (plavix). He will also need a pre-visit appointment, thank you.

## 2017-07-21 ENCOUNTER — Other Ambulatory Visit: Payer: Self-pay

## 2017-07-21 ENCOUNTER — Ambulatory Visit: Payer: Medicare Other

## 2017-07-21 VITALS — Ht 64.0 in | Wt 181.0 lb

## 2017-07-21 DIAGNOSIS — Z86010 Personal history of colonic polyps: Secondary | ICD-10-CM

## 2017-07-21 MED ORDER — NA SULFATE-K SULFATE-MG SULF 17.5-3.13-1.6 GM/177ML PO SOLN: 1.0000 | Freq: Once | ORAL | 0 refills | Status: AC

## 2017-07-21 NOTE — Progress Notes (Signed)
Denies allergies to eggs or soy products. Denies complication of anesthesia or sedation. Denies use of weight loss medication Emmi instructions declined.

## 2017-07-25 ENCOUNTER — Telehealth: Payer: Self-pay

## 2017-07-25 NOTE — Telephone Encounter (Signed)
Sent voucher card to [employer] on [employer] ([phone]) for Suprep - Pay no more than $50.

## 2017-08-01 ENCOUNTER — Telehealth: Payer: Self-pay | Admitting: Gastroenterology

## 2017-08-01 DIAGNOSIS — Z86010 Personal history of colonic polyps: Secondary | ICD-10-CM

## 2017-08-01 MED ORDER — MOVIPREP 100 G PO SOLR: ORAL | 0 refills | Status: DC

## 2017-08-01 NOTE — Telephone Encounter (Signed)
Patient canceled procedure for tomorrow 1.29.19 due to not getting his prep and not being able to pay for prep today. Advised pt that we could try to get him a sample but he insisted that he needed to resch. Pt rescheduled for 3.1.19. Sending message to pv nurse about prep today.

## 2017-08-01 NOTE — Telephone Encounter (Signed)
Patient states prep for colon now rescheduled to 3.1.19 is too expensive and wants to know if he can get something else or a sample.

## 2017-08-01 NOTE — Telephone Encounter (Signed)
Okay thanks for letting me know

## 2017-08-01 NOTE — Telephone Encounter (Signed)
Patient states his Suprep is $170. He then states he can go to the [place] [facility] and get it free. I explained that they do not carry suprep but they do give out Moviprep. Patient is willing to change to Moviprep and get it free from [place] [facility]. New instructions for Moviprep and RX printed in envelope at the front desk on 4 th floor. Pt aware. Encouraged patient to pick this up soon in case there is a problem with prep he won't run out of time and can keep his colon appointment for March 1st. Pt understands.

## 2017-08-02 ENCOUNTER — Encounter: Payer: Medicare Other | Admitting: Gastroenterology

## 2017-09-02 ENCOUNTER — Other Ambulatory Visit: Payer: Self-pay

## 2017-09-02 ENCOUNTER — Encounter: Payer: Self-pay | Admitting: Gastroenterology

## 2017-09-02 ENCOUNTER — Ambulatory Visit (AMBULATORY_SURGERY_CENTER): Payer: Medicare Other | Admitting: Gastroenterology

## 2017-09-02 VITALS — BP 121/71 | HR 68 | Temp 97.8°F | Resp 10 | Ht 64.0 in | Wt 181.0 lb

## 2017-09-02 DIAGNOSIS — D123 Benign neoplasm of transverse colon: Secondary | ICD-10-CM | POA: Diagnosis not present

## 2017-09-02 DIAGNOSIS — Z86010 Personal history of colonic polyps: Secondary | ICD-10-CM

## 2017-09-02 MED ORDER — SODIUM CHLORIDE 0.9 % IV SOLN
500.0000 mL | Freq: Once | INTRAVENOUS | Status: DC
Start: 2017-09-02 — End: 2018-05-23

## 2017-09-02 NOTE — Progress Notes (Signed)
Called to room to assist during endoscopic procedure.  Patient ID and intended procedure confirmed with present staff. Received instructions for my participation in the procedure from the performing physician.  

## 2017-09-02 NOTE — Progress Notes (Signed)
To recovery, report to RN, VSS. 

## 2017-09-02 NOTE — Op Note (Signed)
[place] Patient Name: Jason [name] Procedure Date: 09/02/2017 4:30 PM MRN: [id number] Endoscopist: Remo Lipps P. Armbruster MD, MD Age: 72 Referring MD:  Date of Birth: [birth year] Gender: Male Account #: [id number] Procedure:                Colonoscopy Indications:              High risk colon cancer surveillance: Personal                            history of colonic polyps Medicines:                Monitored Anesthesia Care Procedure:                Pre-Anesthesia Assessment:                           - Prior to the procedure, a History and Physical                            was performed, and patient medications and                            allergies were reviewed. The patient's tolerance of                            previous anesthesia was also reviewed. The risks                            and benefits of the procedure and the sedation                            options and risks were discussed with the patient.                            All questions were answered, and informed consent                            was obtained. Prior Anticoagulants: The patient has                            taken no previous anticoagulant or antiplatelet                            agents. ASA Grade Assessment: III - A patient with                            severe systemic disease. After reviewing the risks                            and benefits, the patient was deemed in                            satisfactory condition to undergo the procedure.  After obtaining informed consent, the colonoscope                            was passed under direct vision. Throughout the                            procedure, the patient's blood pressure, pulse, and                            oxygen saturations were monitored continuously. The                            Colonoscope was introduced through the anus and                            advanced to the the cecum,  identified by                            appendiceal orifice and ileocecal valve. The                            colonoscopy was performed without difficulty. The                            patient tolerated the procedure well. The quality                            of the bowel preparation was adequate. The                            ileocecal valve, appendiceal orifice, and rectum                            were photographed. Scope In: 4:37:06 PM Scope Out: 4:58:44 PM Scope Withdrawal Time: 0 hours 15 minutes 40 seconds  Total Procedure Duration: 0 hours 21 minutes 38 seconds  Findings:                 The perianal and digital rectal examinations were                            normal.                           Many medium-mouthed diverticula were found in the                            entire colon. Highest burden of diverticulosis was                            in the sigmoid colon. There was restricted mobility                            and luminal narrowing making cecal intubation  challenging.                           Four sessile polyps were found in the transverse                            colon. The polyps were 3 to 4 mm in size. These                            polyps were removed with a cold snare. Resection                            and retrieval were complete.                           The exam was otherwise without abnormality on                            direct and retroflexion views. Complications:            No immediate complications. Estimated blood loss:                            Minimal. Estimated Blood Loss:     Estimated blood loss was minimal. Impression:               - Diverticulosis in the entire examined colon,                            associated with luminal narrowing in the sigmoid                            colon.                           - Four 3 to 4 mm polyps in the transverse colon,                             removed with a cold snare. Resected and retrieved.                           - The examination was otherwise normal on direct                            and retroflexion views. Recommendation:           - Patient has a contact number available for                            emergencies. The signs and symptoms of potential                            delayed complications were discussed with the                            patient. Return to normal activities  tomorrow.                            Written discharge instructions were provided to the                            patient.                           - Resume previous diet.                           - Continue present medications.                           - Await pathology results.                           - Repeat colonoscopy is recommended for                            surveillance. The colonoscopy date will be                            determined after pathology results from today's                            exam become available for review. Remo Lipps P. Armbruster MD, MD 09/02/2017 5:03:09 PM This report has been signed electronically.

## 2017-09-02 NOTE — Patient Instructions (Signed)
YOU HAD AN ENDOSCOPIC PROCEDURE TODAY AT [facility]:   Refer to the procedure report that was given to you for any specific questions about what was found during the examination.  If the procedure report does not answer your questions, please call your gastroenterologist to clarify.  If you requested that your care partner not be given the details of your procedure findings, then the procedure report has been included in a sealed envelope for you to review at your convenience later.  YOU SHOULD EXPECT: Some feelings of bloating in the abdomen. Passage of more gas than usual.  Walking can help get rid of the air that was put into your GI tract during the procedure and reduce the bloating. If you had a lower endoscopy (such as a colonoscopy or flexible sigmoidoscopy) you may notice spotting of blood in your stool or on the toilet paper. If you underwent a bowel prep for your procedure, you may not have a normal bowel movement for a few days.  Please Note:  You might notice some irritation and congestion in your nose or some drainage.  This is from the oxygen used during your procedure.  There is no need for concern and it should clear up in a day or so.  SYMPTOMS TO REPORT IMMEDIATELY:   Following lower endoscopy (colonoscopy or flexible sigmoidoscopy):  Excessive amounts of blood in the stool  Significant tenderness or worsening of abdominal pains  Swelling of the abdomen that is new, acute  Fever of 100F or higher   For urgent or emergent issues, a gastroenterologist can be reached at any hour by calling [phone].  Please see handouts given to you on Polyps and Diverticulosis and high Fiber Diet.    DIET:  We do recommend a small meal at first, but then you may proceed to your regular diet.  Drink plenty of fluids but you should avoid alcoholic beverages for 24 hours.  ACTIVITY:  You should plan to take it easy for the rest of today and you should NOT DRIVE or use  heavy machinery until tomorrow (because of the sedation medicines used during the test).    FOLLOW UP: Our staff will call the number listed on your records the next business day following your procedure to check on you and address any questions or concerns that you may have regarding the information given to you following your procedure. If we do not reach you, we will leave a message.  However, if you are feeling well and you are not experiencing any problems, there is no need to return our call.  We will assume that you have returned to your regular daily activities without incident.  If any biopsies were taken you will be contacted by phone or by letter within the next 1-3 weeks.  Please call us at [phone] if you have not heard about the biopsies in 3 weeks.    SIGNATURES/CONFIDENTIALITY: You and/or your care partner have signed paperwork which will be entered into your electronic medical record.  These signatures attest to the fact that that the information above on your After Visit Summary has been reviewed and is understood.  Full responsibility of the confidentiality of this discharge information lies with you and/or your care-partner.  Thank you for letting us take care of your healthcare needs today.

## 2017-09-05 ENCOUNTER — Telehealth: Payer: Self-pay

## 2017-09-05 NOTE — Telephone Encounter (Signed)
  Follow up Call-  Call back number 09/02/2017  Post procedure Call Back phone  # [phone]  Permission to leave phone message Yes  Some recent data might be hidden     Patient questions:  Do you have a fever, pain , or abdominal swelling? No. Pain Score  0 *  Have you tolerated food without any problems? Yes.    Have you been able to return to your normal activities? Yes.    Do you have any questions about your discharge instructions: Diet   No. Medications  No. Follow up visit  No.  Do you have questions or concerns about your Care? No.  Actions: * If pain score is 4 or above: No action needed, pain <4.

## 2017-09-09 ENCOUNTER — Encounter: Payer: Self-pay | Admitting: Gastroenterology

## 2017-09-19 ENCOUNTER — Telehealth: Payer: Self-pay | Admitting: Gastroenterology

## 2017-09-19 ENCOUNTER — Other Ambulatory Visit: Payer: Self-pay

## 2017-09-19 MED ORDER — CIPROFLOXACIN HCL 500 MG PO TABS: 500.0000 mg | ORAL_TABLET | Freq: Two times a day (BID) | ORAL | 0 refills | Status: DC

## 2017-09-19 MED ORDER — METRONIDAZOLE 500 MG PO TABS: 500.0000 mg | ORAL_TABLET | Freq: Two times a day (BID) | ORAL | 0 refills | Status: DC

## 2017-09-19 NOTE — Telephone Encounter (Signed)
Routed to DOD, Dr. [name] patient:  patient for last week has had intermittent abdominal pain, N/V, feverish chills and diarrhea. He states it feels like when he has had other diverticulitis flares. Please advise.

## 2017-09-19 NOTE — Telephone Encounter (Signed)
Cipro 500 mg po bid, #14 Flagyl 500 mg po bid, #14 Low fiber diet for a few days  Call if symptoms worsen or fail to improve

## 2017-09-19 NOTE — Telephone Encounter (Signed)
Patient advised of Dr. Lynne Leader recommendations, Rx sent to his pharmacy. Reviewed what a low fiber diet is and he understands to call if not better or symptoms worsen.

## 2017-10-03 ENCOUNTER — Other Ambulatory Visit: Payer: Self-pay | Admitting: Gastroenterology

## 2017-10-03 ENCOUNTER — Telehealth: Payer: Self-pay | Admitting: Gastroenterology

## 2017-10-03 DIAGNOSIS — R197 Diarrhea, unspecified: Secondary | ICD-10-CM

## 2017-10-03 NOTE — Telephone Encounter (Signed)
Called back and spoke with wife. She endorses diarrhea, no abdominal pain. No fevers. Recommend stool test for C diff. They will go to the lab, I will notify them of the results.

## 2017-10-03 NOTE — Telephone Encounter (Signed)
Patient more bothered by diarrhea and hot/cold chills than abdominal pain. Do you want to order C diff first?

## 2017-10-03 NOTE — Telephone Encounter (Signed)
Patient wife states pt was put on 2 antibiotics for his gi symptoms. Per pt wife the medication helped for a week but now the patient is having the same symptoms as before. Pt wife wanting to know what to do.

## 2017-10-03 NOTE — Telephone Encounter (Signed)
Routed to Dr. Armbruster. 

## 2017-10-03 NOTE — Telephone Encounter (Signed)
He was recently treated empirically for diverticulitis. Can you clarify symptoms he is having - LLQ pain, fevers, etc? If so, we can give him another course of antibiotics if that helped before. Given his PCN allergy I would treat with cipro 500mg  BID and flagyl 500mg  TID again for 10 days. If he's having fever may need to consider a CT scan to rule out complicated diverticulitis.   Otherwise, if his symptoms are not pain but more so diarrhea, etc, may need to consider stool test to rule out C diff.

## 2017-10-04 ENCOUNTER — Other Ambulatory Visit: Payer: Self-pay

## 2017-10-04 DIAGNOSIS — R109 Unspecified abdominal pain: Secondary | ICD-10-CM

## 2017-10-04 DIAGNOSIS — R197 Diarrhea, unspecified: Secondary | ICD-10-CM

## 2017-10-04 NOTE — Telephone Encounter (Signed)
Called patient's wife back, no answer, left her a message that would need some blood work done too. Dr. [name] is also recommending CT abd/pelvis since symptoms have changed. If patient cannot wait to be seen on an outpatient basis, then would need to go to ED for evaluation. Asked her to call me back to let us know. Labs are ordered.

## 2017-10-04 NOTE — Telephone Encounter (Signed)
Patient's wife calling back states patient is in a lot of pain today but no fever. Best call back # [phone].

## 2017-10-04 NOTE — Telephone Encounter (Signed)
Wife called back and for now his symptoms seem to be better, they will come get the labs done tomorrow. She understands to go to ED if symptoms worsen.

## 2017-10-04 NOTE — Telephone Encounter (Signed)
Spoke to wife and she states that today he is having generalized abdominal pain. He has the specimen container for the stool sample, just have not collected it yet. Still having diarrhea and no fever.

## 2017-10-04 NOTE — Telephone Encounter (Signed)
I just spoke with them yesterday and wife denied he was having pain. Perhaps his story is changing. He needs to submit stool sample to rule out C diff, would also check CBC and CMET at this point. Sounds like he may need a CT scan to ensure no evidence of diverticulitis if you can have this scheduled with contrast. If he can't wait to have this done as outpatient and symptoms are worsening his other options is go to the ER

## 2017-10-05 ENCOUNTER — Other Ambulatory Visit (INDEPENDENT_AMBULATORY_CARE_PROVIDER_SITE_OTHER): Payer: Medicare Other

## 2017-10-05 DIAGNOSIS — R197 Diarrhea, unspecified: Secondary | ICD-10-CM

## 2017-10-05 DIAGNOSIS — R109 Unspecified abdominal pain: Secondary | ICD-10-CM

## 2017-10-05 LAB — COMPREHENSIVE METABOLIC PANEL
ALT: 17 U/L (ref 0–53)
AST: 18 U/L (ref 0–37)
Albumin: 3.9 g/dL (ref 3.5–5.2)
Alkaline Phosphatase: 95 U/L (ref 39–117)
BUN: 18 mg/dL (ref 6–23)
CALCIUM: 8.9 mg/dL (ref 8.4–10.5)
CHLORIDE: 106 meq/L (ref 96–112)
CO2: 29 meq/L (ref 19–32)
Creatinine, Ser: 1.57 mg/dL — ABNORMAL HIGH (ref 0.40–1.50)
GFR: 46.57 mL/min — AB (ref >60.00)
Glucose, Bld: 97 mg/dL (ref 70–99)
Potassium: 3.8 mEq/L (ref 3.5–5.1)
Sodium: 141 mEq/L (ref 135–145)
Total Bilirubin: 0.3 mg/dL (ref 0.2–1.2)
Total Protein: 6.8 g/dL (ref 6.0–8.3)

## 2017-10-05 LAB — CBC WITH DIFFERENTIAL/PLATELET
BASOS PCT: 1 % (ref 0.0–3.0)
Basophils Absolute: 0.1 10*3/uL (ref 0.0–0.1)
EOS ABS: 0.2 10*3/uL (ref 0.0–0.7)
Eosinophils Relative: 2.6 % (ref 0.0–5.0)
HEMATOCRIT: 36.3 % — AB (ref 39.0–52.0)
Hemoglobin: 12.1 g/dL — ABNORMAL LOW (ref 13.0–17.0)
LYMPHS PCT: 28.3 % (ref 12.0–46.0)
Lymphs Abs: 1.9 10*3/uL (ref 0.7–4.0)
MCHC: 33.3 g/dL (ref 30.0–36.0)
MCV: 90 fl (ref 78.0–100.0)
Monocytes Absolute: 0.6 10*3/uL (ref 0.1–1.0)
Monocytes Relative: 9.5 % (ref 3.0–12.0)
NEUTROS ABS: 3.9 10*3/uL (ref 1.4–7.7)
Neutrophils Relative %: 58.6 % (ref 43.0–77.0)
PLATELETS: 267 10*3/uL (ref 150.0–400.0)
RBC: 4.03 Mil/uL — ABNORMAL LOW (ref 4.22–5.81)
RDW: 14.6 % (ref 11.5–15.5)
WBC: 6.6 10*3/uL (ref 4.0–10.5)

## 2017-11-17 ENCOUNTER — Telehealth: Payer: Self-pay | Admitting: Gastroenterology

## 2017-11-17 NOTE — Telephone Encounter (Signed)
I called patient to schedule Office visit. Dr. [name] wants patient to be seen ASAP with an APP. Patient states that he has [employer] and will be seeing his pcp at the [place] today. [facility] will have [facility] so this visit will be covered. I informed him that our office will call him when we receive [facility] referral.

## 2017-11-17 NOTE — Telephone Encounter (Signed)
Okay thanks. I saw his wife in clinic today, she states he has been doing poorly and wanted him to be seen soon. Thanks for your help

## 2018-01-12 ENCOUNTER — Other Ambulatory Visit (HOSPITAL_COMMUNITY): Payer: Self-pay | Admitting: Oral Surgery

## 2018-01-17 ENCOUNTER — Ambulatory Visit (INDEPENDENT_AMBULATORY_CARE_PROVIDER_SITE_OTHER): Payer: Non-veteran care | Admitting: Internal Medicine

## 2018-01-17 ENCOUNTER — Ambulatory Visit (INDEPENDENT_AMBULATORY_CARE_PROVIDER_SITE_OTHER): Payer: Medicare Other | Admitting: Internal Medicine

## 2018-01-17 ENCOUNTER — Encounter: Payer: Self-pay | Admitting: Internal Medicine

## 2018-01-17 VITALS — BP 106/70 | HR 70 | Ht 64.0 in | Wt 184.0 lb

## 2018-01-17 DIAGNOSIS — R0609 Other forms of dyspnea: Secondary | ICD-10-CM

## 2018-01-17 DIAGNOSIS — R911 Solitary pulmonary nodule: Secondary | ICD-10-CM | POA: Diagnosis not present

## 2018-01-17 DIAGNOSIS — I10 Essential (primary) hypertension: Secondary | ICD-10-CM

## 2018-01-17 LAB — PULMONARY FUNCTION TEST
FEF 25-75 POST: 1.79 L/s
FEF 25-75 PRE: 1.53 L/s
FEF2575-%CHANGE-POST: 17 %
FEF2575-%PRED-PRE: 80 %
FEF2575-%Pred-Post: 94 %
FEV1-%Change-Post: 3 %
FEV1-%PRED-POST: 81 %
FEV1-%PRED-PRE: 78 %
FEV1-PRE: 1.96 L
FEV1-Post: 2.02 L
FEV1FVC-%CHANGE-POST: 0 %
FEV1FVC-%Pred-Pre: 101 %
FEV6-%Change-Post: 4 %
FEV6-%PRED-POST: 85 %
FEV6-%Pred-Pre: 82 %
FEV6-PRE: 2.64 L
FEV6-Post: 2.75 L
FEV6FVC-%PRED-PRE: 107 %
FEV6FVC-%Pred-Post: 107 %
FVC-%Change-Post: 4 %
FVC-%Pred-Post: 80 %
FVC-%Pred-Pre: 76 %
FVC-POST: 2.75 L
FVC-Pre: 2.64 L
POST FEV1/FVC RATIO: 74 %
POST FEV6/FVC RATIO: 100 %
PRE FEV1/FVC RATIO: 74 %
Pre FEV6/FVC Ratio: 100 %

## 2018-01-17 MED ORDER — LOSARTAN POTASSIUM 50 MG PO TABS: 50.0000 mg | ORAL_TABLET | Freq: Every day | ORAL | 11 refills | Status: DC

## 2018-01-17 MED ORDER — LOSARTAN POTASSIUM 100 MG PO TABS: 100.0000 mg | ORAL_TABLET | Freq: Every day | ORAL | 2 refills | Status: DC

## 2018-01-17 NOTE — Assessment & Plan Note (Addendum)
See spirometry - clearly most likely related to deconditioning/ acei than intrinsic lung fxn which is suprisingly intact despite smoking hx     Total time devoted to counseling  > 50 % of initial 60 min office visit:  review case with pt/wife discussion of options/alternatives/ personally creating written customized instructions  in presence of pt  then going over those specific  Instructions directly with the pt including how to use all of the meds but in particular covering each new medication in detail and the difference between the maintenance= "automatic" meds and the prns using an action plan format for the latter (If this problem/symptom => do that organization reading Left to right).  Please see AVS from this visit for a full list of these instructions which I personally wrote for this pt and  are unique to this visit.

## 2018-01-17 NOTE — Patient Instructions (Addendum)
Your lung function is quite good so if your PET scan suggests a hot spot in your Right upper lobe I would recommend you see Dr Roxan Hockey to have it removed but the [place] will need to make the referral   Your lisinopril is likely the cause of your throat problems  - stop and replace with losartan 100 mg daily through the [place] and follow up there.    No need for pulmonary follow up

## 2018-01-17 NOTE — Progress Notes (Signed)
Jason [name], male    DOB: Sep 27, 1945,     MRN: [id number]     Brief patient profile:  33 yowm  Quit smoking 2004 s obvilous sequelae with hoarseness/ choking sensation on acei onset around 2017 and much worse cough since around Jan 2019 with w/u for carotid artery dz pos for SPN  RUL so w/u with PET scheduled at [facility] and referred to pulmonary clinic 01/17/2018 by [facility]     01/17/2018  f/u ov/Chrisie Jankovich re:  Chief Complaint  Patient presents with  . Pulmonary Consult    [facility] for eval of incidental pulmonary nodule. He has had a cough for the past 7 months- non prod.   Dyspnea x years no change:  Limited by back than breathing but MMRC1 = can walk nl pace, flat grade, can't hurry or go uphills or steps s sob   Cough: dry worse daytime x 7 month/not nocturnal  SABA use: none    No obvious day to day or daytime variability or assoc excess/ purulent sputum or mucus plugs or hemoptysis or cp or chest tightness, subjective wheeze or overt sinus or hb symptoms.   Sleep: able to lie on either side flat one pillow  without nocturnal  or early am exacerbation  of respiratory  c/o's or need for noct saba. Also denies any obvious fluctuation of symptoms with weather or environmental changes or other aggravating or alleviating factors except as outlined above   No unusual exposure hx or h/o childhood pna/ asthma or knowledge of premature birth.  Current Allergies, Complete Past Medical History, Past Surgical History, Family History, and Social History were reviewed in [employer] record.  ROS  The following are not active complaints unless bolded Hoarseness, sore throat, dysphagia, dental problems, itching, sneezing,  nasal congestion or discharge of excess mucus or purulent secretions, ear ache,   fever, chills, sweats, unintended wt loss or wt gain, classically pleuritic or exertional cp,  orthopnea pnd or arm/hand swelling  or leg swelling, presyncope,  palpitations, abdominal pain, anorexia, nausea, vomiting, diarrhea  or change in bowel habits or change in bladder habits, change in stools or change in urine, dysuria, hematuria,  rash, arthralgias, visual complaints, headache, numbness, weakness or ataxia or problems with walking or coordination,  change in mood= depression or  memory.             Past Medical History:  Diagnosis Date  . Acid reflux disease with ulcer   . AKI (acute kidney injury) ([place])   . Anemia   . Arthritis   . Bipolar 1 disorder ([place])   . Blood transfusion without reported diagnosis   . Carotid stenosis   . Cataract   . Chronic back pain   . Complication of anesthesia 2000   reaction to anesthesia had some swelling   . Depression   . GERD (gastroesophageal reflux disease)   . Hearing loss   . Hyperlipidemia   . Hypertension   . Prostate cancer [facility])    prostate cancer  . PTSD (post-traumatic stress disorder)   . Stroke ([place]) 08/2015  . Substance abuse ([place])   . TIA (transient ischemic attack)    has had several mini strokes    Outpatient Medications Prior to Visit  Medication Sig Dispense Refill  . acetaminophen (TYLENOL) 325 MG tablet Take 2 tablets (650 mg total) by mouth every 6 (six) hours as needed for mild pain or moderate pain. 40 tablet 0  .  amLODipine (NORVASC) 10 MG tablet Take 10 mg by mouth daily.    Marland Kitchen aspirin 325 MG tablet Take 325 mg by mouth daily.    Marland Kitchen atorvastatin (LIPITOR) 40 MG tablet Take 1 tablet (40 mg total) by mouth daily at 6 PM. (Patient taking differently: Take 40 mg by mouth at bedtime. ) 30 tablet 0  . calcium carbonate (OSCAL) 1500 (600 Ca) MG TABS tablet Take 1,200 mg of elemental calcium by mouth at bedtime.    . Carboxymethylcellulose Sodium (THERATEARS OP) Place 1 drop into both eyes 4 (four) times daily as needed (for dry eyes).     . ferrous sulfate 325 (65 FE) MG tablet Take 1 tablet (325 mg total) by mouth 2 (two) times daily with a meal. (Patient taking  differently: Take 650 mg by mouth at bedtime. ) 60 tablet 0  . FLUoxetine (PROZAC) 20 MG capsule Take 60 mg by mouth at bedtime.     . gabapentin (NEURONTIN) 300 MG capsule Take 1 capsule (300 mg total) by mouth 3 (three) times daily. 90 capsule 0  . Glucosamine HCl 1000 MG TABS Take 1,000 mg by mouth 2 (two) times daily.    . Glucosamine Sulfate 1000 MG CAPS Take 2,000 mg by mouth at bedtime.     . lamoTRIgine (LAMICTAL) 25 MG tablet Take 100 mg by mouth at bedtime.     Marland Kitchen lisinopril (PRINIVIL,ZESTRIL) 20 MG tablet Take 20 mg by mouth daily.    . mirtazapine (REMERON) 45 MG tablet Take 45 mg by mouth at bedtime.    . Omega-3 Fatty Acids (FISH OIL PO) Take 2 capsules by mouth daily.    . Oxycodone HCl 10 MG TABS Take 10 mg by mouth 3 (three) times daily as needed (for pain).    . pantoprazole (PROTONIX) 40 MG tablet Take 1 tablet (40 mg total) by mouth 2 (two) times daily before a meal. 60 tablet 3  . aspirin EC 325 MG tablet Take 325 mg by mouth at bedtime.    . ciprofloxacin (CIPRO) 500 MG tablet Take 1 tablet (500 mg total) by mouth 2 (two) times daily. 14 tablet 0  . fluticasone (FLONASE) 50 MCG/ACT nasal spray Place 2 sprays into both nostrils daily. 16 g 1  . lisinopril (PRINIVIL,ZESTRIL) 5 MG tablet Take 1 tablet (5 mg total) by mouth daily. 30 tablet 1  . metroNIDAZOLE (FLAGYL) 500 MG tablet Take 1 tablet (500 mg total) by mouth 2 (two) times daily. 14 tablet 0  . omega-3 acid ethyl esters (LOVAZA) 1 g capsule Take 2 g by mouth at bedtime.     Facility-Administered Medications Prior to Visit  Medication Dose Route Frequency Provider Last Rate Last Dose  . 0.9 %  sodium chloride infusion  500 mL Intravenous Once Armbruster, Carlota Raspberry, MD              Objective:     BP 106/70 (BP Location: Left Arm, Cuff Size: Normal)   Pulse 70   Ht 5\' 4"  (1.626 m)   Wt 184 lb (83.5 kg)   SpO2 97%   BMI 31.58 kg/m   SpO2: 97 %   RA  Hoarse wm almost sounds chokes when  speaking    Wt  Readings from Last 3 Encounters:  01/17/18 184 lb (83.5 kg)  09/02/17 181 lb (82.1 kg)  07/21/17 181 lb (82.1 kg)     HEENT: nl dentition, turbinates bilaterally, and oropharynx. Nl external ear canals without cough reflex  NECK :  without JVD/Nodes/TM/ nl carotid upstrokes bilaterally   LUNGS: no acc muscle use,  Nl contour chest which is clear to A and P bilaterally without cough on insp or exp maneuvers   CV:  RRR  no s3 or murmur or increase in P2, and no edema   ABD:  soft and nontender with nl inspiratory excursion in the supine position. No bruits or organomegaly appreciated, bowel sounds nl  MS:  Nl gait/ ext warm without deformities, calf tenderness, cyanosis or clubbing No obvious joint restrictions   SKIN: warm and dry without lesions    NEURO:  alert, approp, nl sensorium with  no motor or cerebellar deficits apparent.        CT December 14 2017  11x 9 mm SPN no change since Aug 08 2017             Assessment   No problem-specific Assessment & Plan notes found for this encounter.     Christinia Gully, MD 01/17/2018

## 2018-01-17 NOTE — Progress Notes (Signed)
PFT before and post completed 01/17/18

## 2018-01-18 ENCOUNTER — Encounter: Payer: Self-pay | Admitting: Internal Medicine

## 2018-01-18 NOTE — Assessment & Plan Note (Addendum)
Quit smoking 2004 RUL 11/9 by ct December 14 2017 no change since Aug 08 2017  - Spirometry 01/17/2018  FEV1 2.02 (81%)  Ratio 74  p 3 % improvement from saba      This is lung cancer until proven otherwise and appears resectable in an operable pt so if PET pos and cleared for surgery from a vascular perspective I rec VATS excisional bx followed by LULobectomy if proves to be CA  If PET neg will still need to be followed serially by Fleischner society guidelines  No need for bronch or bx from my perspective in this setting    Discussed in detail all the  indications, usual  risks and alternatives  relative to the benefits with patient who agrees to proceed with w/u as outlined.

## 2018-01-18 NOTE — Assessment & Plan Note (Signed)
His upper airway symptoms are most likely due to acei   In the best review of chronic cough to date ( NEJM 2016 375 [id number]) ,  ACEi are now felt to cause cough in up to  20% of pts which is a 4 fold increase from previous reports and does not include the variety of non-specific complaints we see in pulmonary clinic in pts on ACEi but previously attributed to another dx like  Copd/asthma and  include PNDS, throat and chest congestion, "bronchitis", unexplained dyspnea and noct "strangling" sensations, and hoarseness, but also  atypical /refractory GERD symptoms like dysphagia and "bad heartburn"   The only way I know  to prove this is not an "ACEi Case" is a trial off ACEi x a minimum of 6 weeks then regroup.   rec losartan 100 mg daily (or the [facility] equivalent) and re-eval ? ent ? If not better p 6 weeks rx

## 2018-01-19 ENCOUNTER — Other Ambulatory Visit: Payer: Self-pay | Admitting: Internal Medicine

## 2018-01-19 ENCOUNTER — Telehealth: Payer: Self-pay | Admitting: Internal Medicine

## 2018-01-19 ENCOUNTER — Ambulatory Visit
Admission: RE | Admit: 2018-01-19 | Discharge: 2018-01-19 | Disposition: A | Discharge status: Home / Self Care | Payer: Self-pay | Source: Ambulatory Visit | Attending: Internal Medicine | Admitting: Internal Medicine

## 2018-01-19 DIAGNOSIS — R52 Pain, unspecified: Secondary | ICD-10-CM

## 2018-01-19 NOTE — Telephone Encounter (Signed)
OV notes have been faxed. Nothing further was needed.

## 2018-01-25 ENCOUNTER — Other Ambulatory Visit (HOSPITAL_COMMUNITY): Payer: Self-pay | Admitting: Oral Surgery

## 2018-01-25 ENCOUNTER — Other Ambulatory Visit (HOSPITAL_COMMUNITY): Payer: Self-pay | Admitting: Physician Assistant

## 2018-01-25 DIAGNOSIS — R911 Solitary pulmonary nodule: Secondary | ICD-10-CM

## 2018-01-25 DIAGNOSIS — C3411 Malignant neoplasm of upper lobe, right bronchus or lung: Secondary | ICD-10-CM

## 2018-01-26 IMAGING — CT CT ANGIO NECK
2 of 7 series · 8 of 33 positions shown · IV contrast (OMNI 350)
Comparison: None.

CLINICAL DATA: Stroke.  History of hypertension.

EXAM:
CT ANGIOGRAPHY NECK
TECHNIQUE: Multidetector CT imaging of the neck was performed using the
standard protocol during bolus administration of intravenous
contrast. Multiplanar CT image reconstructions and MIPs were
obtained to evaluate the vascular anatomy. Carotid stenosis
measurements (when applicable) are obtained utilizing NASCET
criteria, using the distal internal carotid diameter as the
denominator.
CONTRAST:  50mL OMNIPAQUE IOHEXOL 350 MG/ML SOLN

[Series 5: cta neck · axial · 0.35mm/px · z∈[-268,-186]mm · 2 of 123 slices shown]
[im 41/123  soft-tissue]
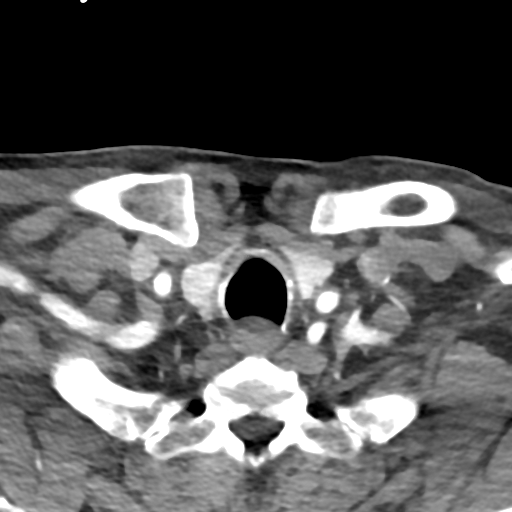
[im 82/123  soft-tissue]
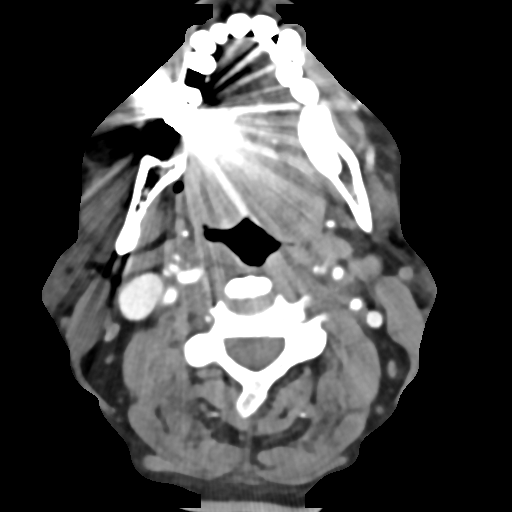

[Series 7: cta neck axial · axial · 0.39mm/px · z∈[-314,-144]mm · 6 of 240 slices shown]
[im 35/240  soft-tissue]
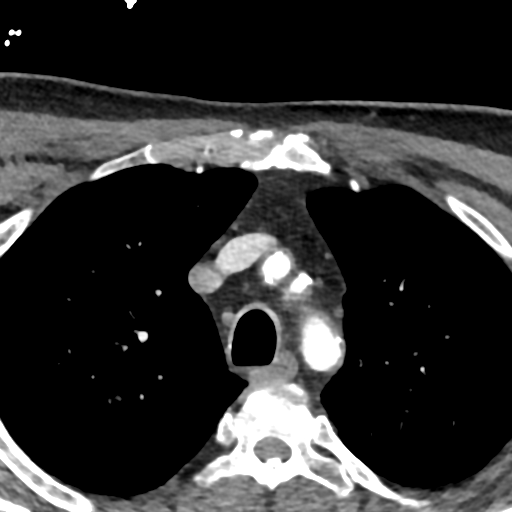
[im 69/240  bone]
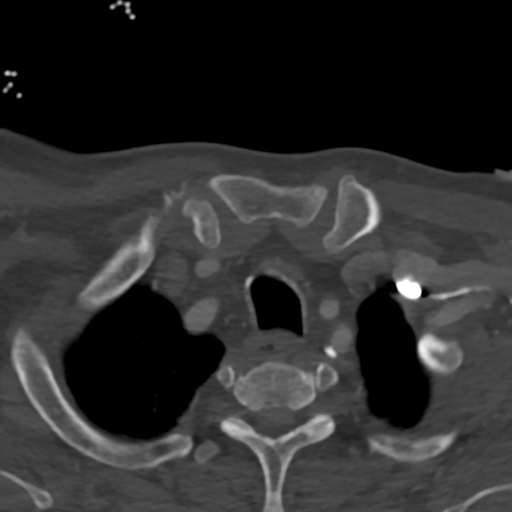
[im 103/240  soft-tissue]
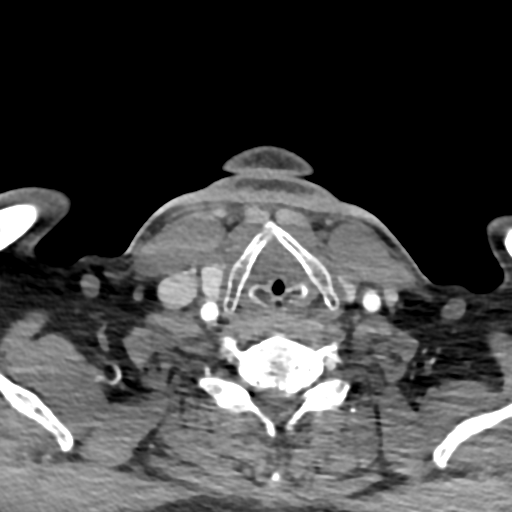
[im 137/240  bone]
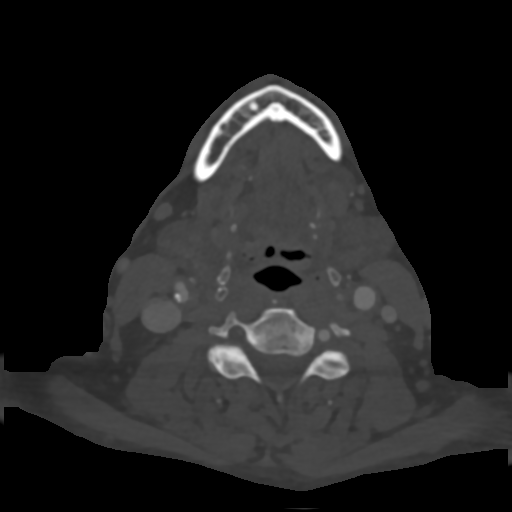
[im 171/240  soft-tissue]
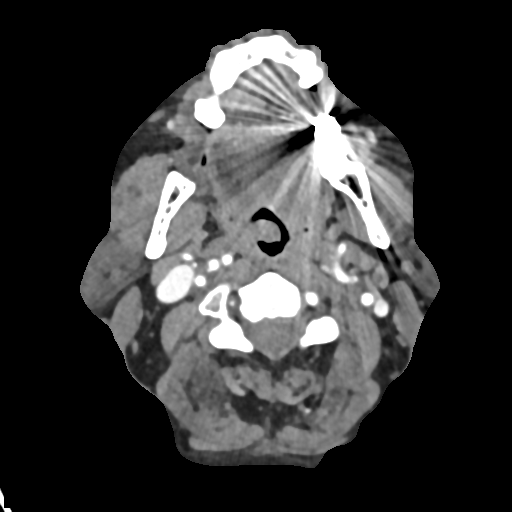
[im 205/240  bone]
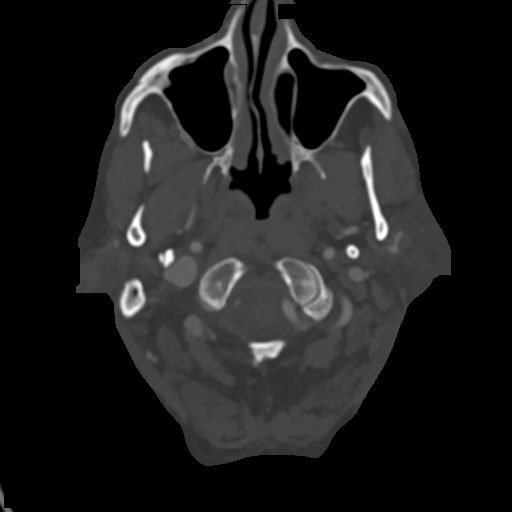

[8 of 33 positions shown; findings below may reference images not displayed]

FINDINGS: Aortic arch: No visualized aneurysm or dissection. Diffuse
atheromatous wall thickening. Three vessel branching

Right carotid system: Diffuse atheromatous wall thickening,
predominantly noncalcified. Wall thickening is particularly notable
at the bifurcation where there is plaque cavitation. No measurable
stenosis. No evidence of superimposed dissection. ICA tortuosity.

Left carotid system: Diffuse atheromatous wall thickening with heavy
plaque deposition around the bifurcation. There is proximal ICA
narrowing at a prominent shelf-like noncalcified plaque along the
posterior wall, but no stenosis of 50% or greater. This stenosis is
visually accentuated by positive remodeling the proximal ICA with
smoothly contoured plaque excavation.

Vertebral arteries:Dominant left vertebral artery with high-grade
ostial stenosis.

Diminutive right vertebral artery with diffuse undulating luminal
appearance. The vessel is not visualized at the C1 level. This
appearance is concerning for dissection, but V4 segment has a
similar appearance to 3215 intracranial MRA and there is no
intramural T1 hyperintense hematoma on conventional MRI performed 2
days ago. There is a V4 segment high-grade stenosis with the right
PICA likely fed by the basilar. High-grade ostial stenosis.

No flow limiting stenosis in the bilateral subclavian artery
proximally. Notable distal brachiocephalic plaque with ulcerated
appearance on coronal reformats.

Skeleton: No contributory findings.

Other neck: 9 mm subpleural right upper lobe nodule with ill-defined
margins. The nodule has somewhat flat morphology on coronal
reformats and could be scar or neoplasm. Follow-up is needed in this
former smoker.

Vascular findings were discussed by telephone at the time of
interpretation on 09/01/2015 at [DATE] to Dr. DEZARAE TAMAYO , who
verbally acknowledged these results.
IMPRESSION: 1. Poor flow and irregularity of the non dominant right vertebral
artery, favored secondary to high-grade stenoses at the V1, V3, and
V4 segments rather than dissection.
2. High-grade left vertebral artery origin stenosis.
3. Extensive cervical carotid atherosclerosis with
irregular/ulcerated bilateral ICA and right brachiocephalic artery
plaque. No flow limiting carotid stenosis in the neck.
4. 9 mm right upper lobe nodule. Follow-up chest CT at 3-9months is
recommended in this former smoker.

## 2018-01-30 ENCOUNTER — Telehealth: Payer: Self-pay | Admitting: Internal Medicine

## 2018-01-30 NOTE — Telephone Encounter (Signed)
I was asked to address the nodule and these studies were done by the [facility] but there was nothing else of immediate concern on the lung scan from my perspective  and it would be best if he has  questions other than the nodule if he asked his [facility] doctor to comment on their findings but there is nothing else that I'm concerned about for now

## 2018-01-30 NOTE — Telephone Encounter (Signed)
Spoke with patient. He is aware that MW reviewed the CD. He is also aware of MW's recs. Nothing further needed at time of call.

## 2018-01-30 NOTE — Telephone Encounter (Signed)
Attempted to call patient today regarding results. I did not receive an answer at time of call. I have left a voicemail message for pt to return call. X1  pt want's MW to look at the CD of scan. At the end of the report it says abnormalities.  Pt said that he doesn't understand what it says exactly, requesting more details.  MW please advise.

## 2018-02-22 ENCOUNTER — Other Ambulatory Visit: Payer: Self-pay

## 2018-02-22 ENCOUNTER — Institutional Professional Consult (permissible substitution) (INDEPENDENT_AMBULATORY_CARE_PROVIDER_SITE_OTHER): Payer: No Typology Code available for payment source | Admitting: Thoracic Surgery (Cardiothoracic Vascular Surgery)

## 2018-02-22 VITALS — BP 98/60 | HR 90 | Resp 16 | Ht 64.0 in | Wt 175.0 lb

## 2018-02-22 DIAGNOSIS — R911 Solitary pulmonary nodule: Secondary | ICD-10-CM | POA: Diagnosis not present

## 2018-02-22 NOTE — Progress Notes (Signed)
PCP is Verline Lema, MD Referring Provider is Verline Lema, MD  Chief Complaint  Patient presents with  . Lung Lesion    RULobe.Marland KitchenMarland KitchenSurgical eval,PET Scan 01/19/18, PFT's 01/17/18, Chest/ABD CT 12/05/17,      HPI: Jason [name] sent for consultation regarding a right upper lobe lung nodule  Jason [name] is a 72 year old man with a 45-pack-year history of tobacco abuse before quitting in 2004.  He does still "vape" regularly.  History is also significant for bipolar disorder, chronic back pain, carotid stenosis, depression, posttraumatic stress disorder, hypertension, hyperlipidemia, prostate cancer, stroke in 2005 and 2017 and arthritis.  He was noted to have a lung nodule on a CT of the head and neck done in 2017, but there was no follow-up of that.  He was being evaluated for possible carotid surgery with a CT angiogram of the neck in February 2019.  He was noted to have a 5 x 9 x 11 mm right apical lung nodule.  A chest CT was done in June which showed the nodule was still present but unchanged in size.  He recently had a PET CT which showed metabolic activity in the lesion with an SUV of 1.   Jason [name] has limited activities due to a chronic back pain he had a lumbar fusion back in October 2018.  He walks with a cane.  He says he would be challenged to get up a flight of stairs.  He gets short of breath with activity including sometimes even taking a shower.  He had a cerebellar stroke in 2005 which affected his balance.  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  He denies any chest pain, pressure, or tightness  Zubrod Score: At the time of surgery this patient's most appropriate activity status/level should be described as: []     0    Normal activity, no symptoms []     1    Restricted in physical strenuous activity but ambulatory, able to do out light work [x]     2    Ambulatory and capable of self care, unable to do work activities, up and about >50 % of waking hours                               []     3    Only limited self care, in bed greater than 50% of waking hours []     4    Completely disabled, no self care, confined to bed or chair []     5    Moribund  Past Medical History:  Diagnosis Date  . Acid reflux disease with ulcer   . AKI (acute kidney injury) ([place])   . Anemia   . Arthritis   . Bipolar 1 disorder ([place])   . Blood transfusion without reported diagnosis   . Carotid stenosis   . Cataract   . Chronic back pain   . Complication of anesthesia 2000   reaction to anesthesia had some swelling   . Depression   . GERD (gastroesophageal reflux disease)   . Hearing loss   . Hyperlipidemia   . Hypertension   . Prostate cancer [facility])    prostate cancer  . PTSD (post-traumatic stress disorder)   . Stroke ([place]) 08/2015  . Substance abuse ([place])   . TIA (transient ischemic attack)    has had several mini strokes    Past Surgical History:  Procedure Laterality Date  .  BACK SURGERY    . CARDIAC CATHETERIZATION     cant remember when  . COLONOSCOPY N/A 10/16/2015   Procedure: COLONOSCOPY;  Surgeon: [name], MD;  Location[facility]COPY;  Service: Gastroenterology;  Laterality: N/A;  . COLONOSCOPY    . ESOPHAGOGASTRODUODENOSCOPY N/A 10/15/2015   Procedure: ESOPHAGOGASTRODUODENOSCOPY (EGD);  Surgeon: [name], MD;  Location: [facility];  Service: Gastroenterology;  Laterality: N/A;  . EYE SURGERY     cataracts  . GIVENS CAPSULE STUDY N/A 10/16/2015   Procedure: GIVENS CAPSULE STUDY;  Surgeon: [name], MD;  Location: [facility];  Service: Gastroenterology;  Laterality: N/A;  . POLYPECTOMY    . PROSTATE SURGERY      Family History  Problem Relation Age of Onset  . Stroke Mother        Hemorrhagic stroke  . Cancer Father        Pancreatic cancer  . Hypertension Father   . Pancreatic cancer Father   . Colon cancer Neg Hx   . Esophageal cancer Neg Hx   . Liver cancer Neg Hx   . Rectal cancer Neg Hx   .  Stomach cancer Neg Hx     Social History Social History   Tobacco Use  . Smoking status: Former Smoker    Packs/day: 1.50    Years: 30.00    Pack years: 45.00    Types: Cigarettes    Last attempt to quit: 03/06/2003    Years since quitting: 14.9  . Smokeless tobacco: Never Used  Substance Use Topics  . Alcohol use: No    Alcohol/week: 0.0 standard drinks    Comment: drank years ago  . Drug use: No    Current Outpatient Medications  Medication Sig Dispense Refill  . amLODipine (NORVASC) 10 MG tablet Take 10 mg by mouth daily.    Marland Kitchen aspirin EC 81 MG tablet Take 81 mg by mouth daily.    Marland Kitchen atorvastatin (LIPITOR) 40 MG tablet Take 1 tablet (40 mg total) by mouth daily at 6 PM. (Patient taking differently: Take 40 mg by mouth at bedtime. ) 30 tablet 0  . calcium carbonate (OSCAL) 1500 (600 Ca) MG TABS tablet Take 1,200 mg of elemental calcium by mouth at bedtime.    . Carboxymethylcellulose Sodium (THERATEARS OP) Place 1 drop into both eyes 4 (four) times daily as needed (for dry eyes).     . ferrous sulfate 325 (65 FE) MG tablet Take 1 tablet (325 mg total) by mouth 2 (two) times daily with a meal. (Patient taking differently: Take 650 mg by mouth at bedtime. ) 60 tablet 0  . FLUoxetine (PROZAC) 20 MG capsule Take 60 mg by mouth at bedtime.     . gabapentin (NEURONTIN) 300 MG capsule Take 1 capsule (300 mg total) by mouth 3 (three) times daily. 90 capsule 0  . Glucosamine HCl 1000 MG TABS Take 2,000 mg by mouth at bedtime.     Marland Kitchen losartan (COZAAR) 100 MG tablet Take 100 mg by mouth daily.    . methocarbamol (ROBAXIN) 750 MG tablet Take 750 mg by mouth every 6 (six) hours as needed for muscle spasms.    . mirtazapine (REMERON) 45 MG tablet Take 45 mg by mouth at bedtime.    . Omega-3 Fatty Acids (FISH OIL PO) Take 2 capsules by mouth daily.    . Oxycodone HCl 10 MG TABS Take 10 mg by mouth 3 (three) times daily as needed (for pain).    Marland Kitchen  pantoprazole (PROTONIX) 40 MG tablet Take 1 tablet  (40 mg total) by mouth 2 (two) times daily before a meal. 60 tablet 3   Current Facility-Administered Medications  Medication Dose Route Frequency Provider Last Rate Last Dose  . 0.9 %  sodium chloride infusion  500 mL Intravenous Once Armbruster, Carlota Raspberry, MD        Allergies  Allergen Reactions  . Penicillins Other (See Comments)    Reaction:  Unknown  Has patient had a PCN reaction causing immediate rash, facial/tongue/throat swelling, SOB or lightheadedness with hypotension: Unknown Has patient had a PCN reaction causing severe rash involving mucus membranes or skin necrosis: Unknown Has patient had a PCN reaction that required hospitalization: Unknown Has patient had a PCN reaction occurring within the last 10 years: No If all of the above answers are "NO", then may proceed with Cephalosporin use.    Review of Systems  Constitutional: Positive for fatigue. Negative for unexpected weight change.  HENT: Positive for voice change. Negative for trouble swallowing.   Eyes: Negative for visual disturbance.  Respiratory: Positive for cough and shortness of breath (Light exertion). Negative for chest tightness.   Cardiovascular: Positive for palpitations and leg swelling. Negative for chest pain.  Gastrointestinal: Positive for abdominal pain (Reflux) and diarrhea.  Genitourinary: Negative for difficulty urinating and dysuria.  Musculoskeletal: Positive for arthralgias, back pain and gait problem.  Neurological: Negative for syncope and speech difficulty.       Memory loss  Psychiatric/Behavioral: Positive for dysphoric mood. The patient is nervous/anxious.     BP 98/60 (BP Location: Left Arm, Patient Position: Sitting, Cuff Size: Large)   Pulse 90   Resp 16   Ht 5\' 4"  (1.626 m)   Wt 175 lb (79.4 kg)   SpO2 94% Comment: RA  BMI 30.04 kg/m  Physical Exam  Constitutional: No distress.  Elderly, appears older than stated age  HENT:  Head: Normocephalic and atraumatic.   Mouth/Throat: No oropharyngeal exudate.  Eyes: Conjunctivae and EOM are normal. No scleral icterus.  Neck: No thyromegaly present.  Cardiovascular: Normal rate and regular rhythm.  No murmur heard. Pulmonary/Chest: Breath sounds normal. No respiratory distress. He has no wheezes.  Abdominal: Bowel sounds are normal. He exhibits no distension. There is no tenderness.  Musculoskeletal: He exhibits edema.  Lymphadenopathy:    He has no cervical adenopathy.  Neurological: No cranial nerve deficit. He exhibits normal muscle tone. Coordination normal.  Skin: Skin is warm and dry.  Vitals reviewed.    Diagnostic Tests: CT head and neck February 2017 IMPRESSION: 1. Poor flow and irregularity of the non dominant right vertebral artery, favored secondary to high-grade stenoses at the V1, V3, and V4 segments rather than dissection. 2. High-grade left vertebral artery origin stenosis. 3. Extensive cervical carotid atherosclerosis with irregular/ulcerated bilateral ICA and right brachiocephalic artery plaque. No flow limiting carotid stenosis in the neck. 4. 9 mm right upper lobe nodule. Follow-up chest CT at 3-45months is recommended in this former smoker.   Electronically Signed   By: Monte Fantasia M.D.   On: 09/01/2015 10:21  CT chest and abdomen 12/05/2017 [facility] Impression: 1. Irregular right upper lobe pulmonary nodule measuring 11 x 9 mm; all this finding could represent an area of irregular scarring, neoplasm is included within the differential, and thoracic surgery consultation is recommended.   PET/CT [facility] 01/19/2018 Impression: 1.  The known irregular nodular opacity in the posterior right apex demonstrates only minimal metabolic activity and is not  significantly changed in size dating back to 08/08/2017 this can be seen with scarring.  Follow-up chest CT in 6 months is recommended.  I personally reviewed the CT head and neck from 2017 and compared to the  CT and PET/CT images that were brought on disc.  There is no significant difference in the portion of the nodule visualized.   Impression: Jason [name] is a 72 year old gent with a history of tobacco abuse who was found to have a lung nodule on a CT of the head neck and 2017.  This was a 9 mm nodule in the right apex.  Earlier this year he was being evaluated for possible carotid surgery with a CT angiogram of the neck which again demonstrated this nodule.  A follow-up CT showed the nodule was unchanged and the PET/CT showed minimal activity.  This is most likely scar.  I cannot completely rule out the possibility of a very slow-growing neoplasm.  The quality of the scan of the nodule from 2017 is not as good as the more recent scan so I cannot say with absolute certainty that there has not been some minor interval change.  I do think this can be safely followed radiographically.  I do not see an indication for surgery.   I recommended that he stopped vaping.  He needs follow-up with vascular surgery regarding his carotid disease.  Plan: Return in 6 months with CT chest  Melrose Nakayama, MD [facility] [phone]

## 2018-04-16 ENCOUNTER — Emergency Department (HOSPITAL_BASED_OUTPATIENT_CLINIC_OR_DEPARTMENT_OTHER): Payer: Medicare Other

## 2018-04-16 ENCOUNTER — Emergency Department (HOSPITAL_BASED_OUTPATIENT_CLINIC_OR_DEPARTMENT_OTHER)
Admission: EM | Admit: 2018-04-16 | Discharge: 2018-04-16 | Disposition: A | Discharge status: Home / Self Care | Payer: Medicare Other | Attending: Emergency Medicine | Admitting: Emergency Medicine

## 2018-04-16 ENCOUNTER — Other Ambulatory Visit: Payer: Self-pay

## 2018-04-16 ENCOUNTER — Encounter (HOSPITAL_BASED_OUTPATIENT_CLINIC_OR_DEPARTMENT_OTHER): Payer: Self-pay

## 2018-04-16 DIAGNOSIS — M549 Dorsalgia, unspecified: Secondary | ICD-10-CM | POA: Diagnosis not present

## 2018-04-16 DIAGNOSIS — L03116 Cellulitis of left lower limb: Secondary | ICD-10-CM | POA: Diagnosis not present

## 2018-04-16 DIAGNOSIS — Z87891 Personal history of nicotine dependence: Secondary | ICD-10-CM | POA: Diagnosis not present

## 2018-04-16 DIAGNOSIS — R2242 Localized swelling, mass and lump, left lower limb: Secondary | ICD-10-CM | POA: Diagnosis present

## 2018-04-16 DIAGNOSIS — G8929 Other chronic pain: Secondary | ICD-10-CM | POA: Insufficient documentation

## 2018-04-16 DIAGNOSIS — I129 Hypertensive chronic kidney disease with stage 1 through stage 4 chronic kidney disease, or unspecified chronic kidney disease: Secondary | ICD-10-CM | POA: Insufficient documentation

## 2018-04-16 DIAGNOSIS — N184 Chronic kidney disease, stage 4 (severe): Secondary | ICD-10-CM | POA: Insufficient documentation

## 2018-04-16 DIAGNOSIS — Z8546 Personal history of malignant neoplasm of prostate: Secondary | ICD-10-CM | POA: Diagnosis not present

## 2018-04-16 DIAGNOSIS — Z79899 Other long term (current) drug therapy: Secondary | ICD-10-CM | POA: Diagnosis not present

## 2018-04-16 DIAGNOSIS — Z8673 Personal history of transient ischemic attack (TIA), and cerebral infarction without residual deficits: Secondary | ICD-10-CM | POA: Insufficient documentation

## 2018-04-16 DIAGNOSIS — Z7982 Long term (current) use of aspirin: Secondary | ICD-10-CM | POA: Insufficient documentation

## 2018-04-16 LAB — CBC WITH DIFFERENTIAL/PLATELET
Abs Immature Granulocytes: 0.03 10*3/uL (ref 0.00–0.07)
BASOS ABS: 0.1 10*3/uL (ref 0.0–0.1)
BASOS PCT: 1 %
EOS ABS: 0.1 10*3/uL (ref 0.0–0.5)
EOS PCT: 1 %
HCT: 36.1 % — ABNORMAL LOW (ref 39.0–52.0)
HEMOGLOBIN: 11.1 g/dL — AB (ref 13.0–17.0)
Immature Granulocytes: 0 %
LYMPHS PCT: 12 %
Lymphs Abs: 1.3 10*3/uL (ref 0.7–4.0)
MCH: 29.9 pg (ref 26.0–34.0)
MCHC: 30.7 g/dL (ref 30.0–36.0)
MCV: 97.3 fL (ref 80.0–100.0)
Monocytes Absolute: 1 10*3/uL (ref 0.1–1.0)
Monocytes Relative: 9 %
NRBC: 0 % (ref 0.0–0.2)
Neutro Abs: 8.1 10*3/uL — ABNORMAL HIGH (ref 1.7–7.7)
Neutrophils Relative %: 77 %
PLATELETS: 291 10*3/uL (ref 150–400)
RBC: 3.71 MIL/uL — AB (ref 4.22–5.81)
RDW: 13.9 % (ref 11.5–15.5)
WBC: 10.6 10*3/uL — AB (ref 4.0–10.5)

## 2018-04-16 LAB — SEDIMENTATION RATE: SED RATE: 33 mm/h — AB (ref 0–16)

## 2018-04-16 LAB — BASIC METABOLIC PANEL
Anion gap: 11 (ref 5–15)
BUN: 23 mg/dL (ref 8–23)
CHLORIDE: 103 mmol/L (ref 98–111)
CO2: 26 mmol/L (ref 22–32)
CREATININE: 2.18 mg/dL — AB (ref 0.61–1.24)
Calcium: 9 mg/dL (ref 8.9–10.3)
GFR, EST AFRICAN AMERICAN: 33 mL/min — AB (ref >60)
GFR, EST NON AFRICAN AMERICAN: 29 mL/min — AB (ref >60)
Glucose, Bld: 90 mg/dL (ref 70–99)
Potassium: 4.4 mmol/L (ref 3.5–5.1)
SODIUM: 140 mmol/L (ref 135–145)

## 2018-04-16 LAB — C-REACTIVE PROTEIN: CRP: 2.9 mg/dL — AB (ref <1.0)

## 2018-04-16 MED ORDER — CEPHALEXIN 250 MG PO CAPS
500.0000 mg | ORAL_CAPSULE | Freq: Once | ORAL | Status: AC
  Administered 2018-04-16: 500 mg via ORAL
  Filled 2018-04-16: qty 2

## 2018-04-16 MED ORDER — CEPHALEXIN 500 MG PO CAPS: 500.0000 mg | ORAL_CAPSULE | Freq: Two times a day (BID) | ORAL | 0 refills | Status: AC

## 2018-04-16 MED ORDER — OXYCODONE-ACETAMINOPHEN 5-325 MG PO TABS
2.0000 | ORAL_TABLET | Freq: Once | ORAL | Status: AC
  Administered 2018-04-16: 2 via ORAL
  Filled 2018-04-16: qty 2

## 2018-04-16 MED ORDER — LIDOCAINE-EPINEPHRINE (PF) 2 %-1:200000 IJ SOLN
10.0000 mL | Freq: Once | INTRAMUSCULAR | Status: DC
  Filled 2018-04-16 (×2): qty 10

## 2018-04-16 NOTE — ED Triage Notes (Signed)
Pt tenderness over knee 3-4 days ago that progressed into lower leg swelling with pain.

## 2018-04-16 NOTE — ED Provider Notes (Signed)
[facility] EMERGENCY DEPARTMENT Provider Note   CSN: [id number] Arrival date & time: 04/16/18  1333     History   Chief Complaint Chief Complaint  Patient presents with  . Leg Swelling    HPI Jason [name] is a 72 y.o. male with a past medical history of hypertension, stroke, chronic back pain, PTSD, who presents today for evaluation of left knee pain.  He reports that for the past 2 to 3 days he has had worsening pain in his left knee and now it is worsened to the point that he is unable to bear weight secondary to pain.  He says that he had a small bump on the knee that he "played with" and since then it has worsened.  He has redness to his knee.  He had fevers prior to onset of symptoms.  However he does report that intermittently he has fevers about once a month for 2 to 3 day and has had this for the past year.  He reports he did not check his temperature.  He denies any body aches, nausea vomiting or diarrhea.  No abdominal pain.    HPI  Past Medical History:  Diagnosis Date  . Acid reflux disease with ulcer   . AKI (acute kidney injury) ([place])   . Anemia   . Arthritis   . Bipolar 1 disorder ([place])   . Blood transfusion without reported diagnosis   . Carotid stenosis   . Cataract   . Chronic back pain   . Complication of anesthesia 2000   reaction to anesthesia had some swelling   . Depression   . GERD (gastroesophageal reflux disease)   . Hearing loss   . Hyperlipidemia   . Hypertension   . Prostate cancer [facility])    prostate cancer  . PTSD (post-traumatic stress disorder)   . Stroke ([place]) 08/2015  . Substance abuse ([place])   . TIA (transient ischemic attack)    has had several mini strokes    Patient Active Problem List   Diagnosis Date Noted  . CKD (chronic kidney disease) stage 4, GFR 15-29 ml/min (H[facility]) 04/16/2018  . DOE (dyspnea on exertion) 01/17/2018  . Solitary pulmonary nodule on lung CT 01/17/2018  . Adrenal incidentaloma ([place])   .  Ureteral calculus   . Sepsis ([place]) 12/24/2016  . Acute blood loss anemia   . Melena   . Anemia 10/14/2015  . Hyperglycemia 10/14/2015  . Acid reflux disease with ulcer   . Bleeding gastrointestinal   . Symptomatic anemia   . Upper GI bleeding   . Carotid artery stenosis   . HLD (hyperlipidemia) 08/31/2015  . CVA (cerebral infarction) 08/30/2015  . Degeneration of lumbar or lumbosacral intervertebral disc 05/16/2014  . Weakness 05/03/2014  . Dysarthria 04/30/2014  . AKI (acute kidney injury) ([place]) 04/30/2014  . Hypotension 04/30/2014  . Fall 04/30/2014  . CVA (cerebral vascular accident) ([place]) 04/13/2014  . Stroke ([place]) 04/13/2014  . CAP (community acquired pneumonia) 02/13/2013  . Musculoskeletal pain 02/13/2013  . Hx of completed stroke 02/13/2013  . Bipolar 1 disorder ([place]) 02/13/2013  . Depression 02/13/2013  . Essential hypertension 02/13/2013  . H/O prostate cancer 02/13/2013  . Nausea with vomiting 02/13/2013  . GERD (gastroesophageal reflux disease) 02/13/2013  . Poor balance 02/13/2013    Past Surgical History:  Procedure Laterality Date  . BACK SURGERY    . CARDIAC CATHETERIZATION     cant remember when  . COLONOSCOPY N/A 10/16/2015  Procedure: COLONOSCOPY;  Surgeon: [name], MD;  Location: [facility] ENDOSCOPY;  Service: Gastroenterology;  Laterality: N/A;  . COLONOSCOPY    . ESOPHAGOGASTRODUODENOSCOPY N/A 10/15/2015   Procedure: ESOPHAGOGASTRODUODENOSCOPY (EGD);  Surgeon: [name], MD;  Location: [facility];  Service: Gastroenterology;  Laterality: N/A;  . EYE SURGERY     cataracts  . GIVENS CAPSULE STUDY N/A 10/16/2015   Procedure: GIVENS CAPSULE STUDY;  Surgeon: [name], MD;  Location: [facility];  Service: Gastroenterology;  Laterality: N/A;  . POLYPECTOMY    . PROSTATE SURGERY          Home Medications    Prior to Admission medications   Medication Sig Start Date End Date Taking? Authorizing Provider    amLODipine (NORVASC) 10 MG tablet Take 10 mg by mouth daily.    [provider]  aspirin EC 81 MG tablet Take 81 mg by mouth daily.    [provider]  atorvastatin (LIPITOR) 40 MG tablet Take 1 tablet (40 mg total) by mouth daily at 6 PM. Patient taking differently: Take 40 mg by mouth at bedtime.  04/14/14   Mikhail, Velta Addison, DO  calcium carbonate (OSCAL) 1500 (600 Ca) MG TABS tablet Take 1,200 mg of elemental calcium by mouth at bedtime.    [provider]  Carboxymethylcellulose Sodium (THERATEARS OP) Place 1 drop into both eyes 4 (four) times daily as needed (for dry eyes).     [provider]  cephALEXin (KEFLEX) 500 MG capsule Take 1 capsule (500 mg total) by mouth 2 (two) times daily for 10 days. 04/16/18 04/26/18  Lorin Glass, PA-C  ferrous sulfate 325 (65 FE) MG tablet Take 1 tablet (325 mg total) by mouth 2 (two) times daily with a meal. Patient taking differently: Take 650 mg by mouth at bedtime.  10/17/15   Robbie Lis, MD  FLUoxetine (PROZAC) 20 MG capsule Take 60 mg by mouth at bedtime.     [provider]  gabapentin (NEURONTIN) 300 MG capsule Take 1 capsule (300 mg total) by mouth 3 (three) times daily. 01/12/17   Barton Dubois, MD  Glucosamine HCl 1000 MG TABS Take 2,000 mg by mouth at bedtime.     [provider]  losartan (COZAAR) 100 MG tablet Take 100 mg by mouth daily.    [provider]  methocarbamol (ROBAXIN) 750 MG tablet Take 750 mg by mouth every 6 (six) hours as needed for muscle spasms.    [provider]  mirtazapine (REMERON) 45 MG tablet Take 45 mg by mouth at bedtime.    [provider]  Omega-3 Fatty Acids (FISH OIL PO) Take 2 capsules by mouth daily.    [provider]  Oxycodone HCl 10 MG TABS Take 10 mg by mouth 3 (three) times daily as needed (for pain).    [provider]  pantoprazole (PROTONIX) 40 MG tablet Take 1 tablet (40 mg total) by mouth 2  (two) times daily before a meal. 12/28/16   Rai, Vernelle Emerald, MD    Family History Family History  Problem Relation Age of Onset  . Stroke Mother        Hemorrhagic stroke  . Cancer Father        Pancreatic cancer  . Hypertension Father   . Pancreatic cancer Father   . Colon cancer Neg Hx   . Esophageal cancer Neg Hx   . Liver cancer Neg Hx   . Rectal cancer Neg Hx   .  Stomach cancer Neg Hx     Social History Social History   Tobacco Use  . Smoking status: Former Smoker    Packs/day: 1.50    Years: 30.00    Pack years: 45.00    Types: Cigarettes    Last attempt to quit: 03/06/2003    Years since quitting: 15.1  . Smokeless tobacco: Never Used  Substance Use Topics  . Alcohol use: No    Alcohol/week: 0.0 standard drinks    Comment: drank years ago  . Drug use: No     Allergies   Penicillins   Review of Systems Review of Systems  Constitutional: Positive for chills and fever.  Musculoskeletal: Positive for arthralgias and joint swelling.  Skin: Positive for color change.  Neurological: Negative for weakness and numbness.  All other systems reviewed and are negative.    Physical Exam Updated Vital Signs BP (!) 109/55 (BP Location: Right Arm)   Pulse 98   Temp 98.1 F (36.7 C)   Resp 16   Ht 5\' 4"  (1.626 m)   Wt 81.6 kg   SpO2 97%   BMI 30.90 kg/m   Physical Exam  Constitutional: He is oriented to person, place, and time. He appears well-developed and well-nourished. No distress.  HENT:  Head: Normocephalic and atraumatic.  Eyes: Conjunctivae are normal. Right eye exhibits no discharge. Left eye exhibits no discharge. No scleral icterus.  Neck: Normal range of motion.  Cardiovascular: Normal rate, regular rhythm and intact distal pulses.  Left leg 2+ DP/PT pulses.  Pulmonary/Chest: Effort normal. No stridor. No respiratory distress.  Abdominal: He exhibits no distension.  Musculoskeletal: He exhibits no edema or deformity.  Lower extremity: There  is diffuse tenderness to palpation over the anterior left knee.  Patient has flexion of the knee to approximately 70 degrees, however reports significant pain.  There is no tenderness to palpation over the left ankle, or hip.  Neurological: He is alert and oriented to person, place, and time. He exhibits normal muscle tone.  Station intact to left leg.  Skin: Skin is warm and dry. He is not diaphoretic.  Left knee knee is warm with diffuse erythema.  Redness extends from slightly above the knee down the leg.  Longest length is 36cm, width is 20 cm.  Please see clinical images.    Psychiatric: He has a normal mood and affect. His behavior is normal.  Nursing note and vitals reviewed.          ED Treatments / Results  Labs (all labs ordered are listed, but only abnormal results are displayed) Labs Reviewed  BASIC METABOLIC PANEL - Abnormal; Notable for the following components:      Result Value   Creatinine, Ser 2.18 (*)    GFR calc non Af Amer 29 (*)    GFR calc Af Amer 33 (*)    All other components within normal limits  CBC WITH DIFFERENTIAL/PLATELET - Abnormal; Notable for the following components:   WBC 10.6 (*)    RBC 3.71 (*)    Hemoglobin 11.1 (*)    HCT 36.1 (*)    Neutro Abs 8.1 (*)    All other components within normal limits  SEDIMENTATION RATE - Abnormal; Notable for the following components:   Sed Rate 33 (*)    All other components within normal limits  C-REACTIVE PROTEIN    EKG None  Radiology Dg Knee Complete 4 Views Left  Result Date: 04/16/2018 CLINICAL DATA:  Patient with knee pain  and swelling. No known injury. EXAM: LEFT KNEE - COMPLETE 4+ VIEW COMPARISON:  None. FINDINGS: Normal anatomic alignment. Tricompartmental osteophytosis. No acute fracture. No joint effusion. IMPRESSION: Tricompartmental degenerative changes. No acute osseous abnormality. Electronically Signed   By: Lovey Newcomer M.D.   On: 04/16/2018 15:32    Procedures Procedures  (including critical care time)  Medications Ordered in ED Medications  lidocaine-EPINEPHrine (XYLOCAINE W/EPI) 2 %-1:200000 (PF) injection 10 mL (10 mLs Infiltration Not Given 04/16/18 1535)  cephALEXin (KEFLEX) capsule 500 mg (has no administration in time range)  oxyCODONE-acetaminophen (PERCOCET/ROXICET) 5-325 MG per tablet 2 tablet (2 tablets Oral Given 04/16/18 1433)     Initial Impression / Assessment and Plan / ED Course  I have reviewed the triage vital signs and the nursing notes.  Pertinent labs & imaging results that were available during my care of the patient were reviewed by me and considered in my medical decision making (see chart for details).  Clinical Course as of Apr 16 1646  Sun Apr 16, 2018  1626 Spoke with pharmacy at [facility] cone to do 500 BID   [EH]  [id number]   [EH]    Clinical Course User Index [EH] Lorin Glass, PA-C   Patient presents today for evaluation of pain in his left leg that is been ongoing for the past 3 days.  He reports vague feelings of fevers and chills, however reports that these have been going on for approximately 1 year and he gets them every month.  Here he is afebrile, not tachycardic or tachypneic, not consistent with Sirs/sepsis.  He has diffuse redness over his left knee and lower leg.  He is able to bend his knee, however reports inability to bear weight secondary to pain.  X-rays did not show a significant joint effusion.  Given his ability to bend his knee, and diffuse redness appearing over the lower leg more consistent with a cellulitis at this time rather than septic arthritis.  White count is mildly elevated at 10.6 with neutrophils of 8.1.  Creatinine is elevated slightly from his baseline at 2.18 with a GFR of 29.  Pharmacy was consulted for dosing adjustment for Keflex who recommended 500 twice daily.  Patient reports his tetanus is up-to-date.  Pain was treated while in the emergency room.    Return precautions were  discussed with patient who states their understanding.  At the time of discharge patient denied any unaddressed complaints or concerns.  Patient is agreeable for discharge home.  Patient instructed to follow up in 2 days for wound re-check, elevate leg, f/u sooner if fevers or other concerns. Has a walker at home he can use to get around.   This patient was seen as a shared visit with Dr. Winfred Leeds who evaluated the patient and agreed with plan.    Final Clinical Impressions(s) / ED Diagnoses   Final diagnoses:  Cellulitis of left lower extremity  CKD (chronic kidney disease) stage 4, GFR 15-29 ml/min [facility])    ED Discharge Orders         Ordered    cephALEXin (KEFLEX) 500 MG capsule  2 times daily     04/16/18 1643           Lorin Glass, [place] 04/16/18 1654    Orlie Dakin, MD 04/16/18 [id number]

## 2018-04-16 NOTE — Discharge Instructions (Addendum)
While in the emergency room you were given your first dose of antibiotics.  Please get your prescription tonight or early tomorrow morning and take your next dose tomorrow morning.  It is important that you follow-up in the next 2 days for a wound recheck.  Please return sooner if you get a fever with a temperature over 100.4, nausea, vomiting, worsening symptoms, or have additional concerns.  You may have diarrhea from the antibiotics.  It is very important that you continue to take the antibiotics even if you get diarrhea unless a medical professional tells you that you may stop taking them.  If you stop too early the bacteria you are being treated for will become stronger and you may need different, more powerful antibiotics that have more side effects and worsening diarrhea.  Please stay well hydrated and consider probiotics as they may decrease the severity of your diarrhea.

## 2018-04-16 NOTE — ED Provider Notes (Signed)
Complains of left leg pain and swelling for the past 3 or 4 days.  Pain is worse with weightbearing and improved with nonweightbearing.  He is treated himself with Percocet which he takes for chronic back pain.  Denies fever.  He states he did have chills 2 days before the onset of the pain.  No nausea or vomiting.  No other associated symptoms.  On exam alert nontoxic left lower extremity there is no obvious knee effusion or swelling of the knee.  There is redness and tenderness anteriorly from immediately proximal to the knee overlying patella to the proximal one third of the shin.  No red streaks proximally.  DP pulse 2+.  Good capillary refill no calf tenderness or thigh tenderness.  No soft tissue swelling.  His current on tetanus immunization   Orlie Dakin, MD 04/16/18 [phone]

## 2018-05-20 ENCOUNTER — Other Ambulatory Visit: Payer: Self-pay

## 2018-05-20 ENCOUNTER — Emergency Department (HOSPITAL_BASED_OUTPATIENT_CLINIC_OR_DEPARTMENT_OTHER): Payer: Medicare Other

## 2018-05-20 ENCOUNTER — Inpatient Hospital Stay (HOSPITAL_BASED_OUTPATIENT_CLINIC_OR_DEPARTMENT_OTHER)
Admission: EM | Admit: 2018-05-20 | Discharge: 2018-05-23 | DRG: 682 | Disposition: A | Discharge status: Home / Self Care | Payer: Medicare Other | Attending: Internal Medicine | Admitting: Internal Medicine

## 2018-05-20 DIAGNOSIS — D509 Iron deficiency anemia, unspecified: Secondary | ICD-10-CM | POA: Diagnosis present

## 2018-05-20 DIAGNOSIS — Z781 Physical restraint status: Secondary | ICD-10-CM

## 2018-05-20 DIAGNOSIS — R339 Retention of urine, unspecified: Secondary | ICD-10-CM | POA: Diagnosis not present

## 2018-05-20 DIAGNOSIS — N183 Chronic kidney disease, stage 3 (moderate): Secondary | ICD-10-CM | POA: Diagnosis present

## 2018-05-20 DIAGNOSIS — K5900 Constipation, unspecified: Secondary | ICD-10-CM | POA: Diagnosis present

## 2018-05-20 DIAGNOSIS — N184 Chronic kidney disease, stage 4 (severe): Secondary | ICD-10-CM | POA: Diagnosis present

## 2018-05-20 DIAGNOSIS — N433 Hydrocele, unspecified: Secondary | ICD-10-CM | POA: Diagnosis present

## 2018-05-20 DIAGNOSIS — K219 Gastro-esophageal reflux disease without esophagitis: Secondary | ICD-10-CM | POA: Diagnosis present

## 2018-05-20 DIAGNOSIS — Z8249 Family history of ischemic heart disease and other diseases of the circulatory system: Secondary | ICD-10-CM

## 2018-05-20 DIAGNOSIS — N179 Acute kidney failure, unspecified: Secondary | ICD-10-CM | POA: Diagnosis not present

## 2018-05-20 DIAGNOSIS — E860 Dehydration: Secondary | ICD-10-CM | POA: Diagnosis present

## 2018-05-20 DIAGNOSIS — Z87442 Personal history of urinary calculi: Secondary | ICD-10-CM

## 2018-05-20 DIAGNOSIS — R0902 Hypoxemia: Secondary | ICD-10-CM

## 2018-05-20 DIAGNOSIS — Z880 Allergy status to penicillin: Secondary | ICD-10-CM

## 2018-05-20 DIAGNOSIS — Z7982 Long term (current) use of aspirin: Secondary | ICD-10-CM

## 2018-05-20 DIAGNOSIS — R1030 Lower abdominal pain, unspecified: Secondary | ICD-10-CM

## 2018-05-20 DIAGNOSIS — Z79899 Other long term (current) drug therapy: Secondary | ICD-10-CM

## 2018-05-20 DIAGNOSIS — I10 Essential (primary) hypertension: Secondary | ICD-10-CM | POA: Diagnosis present

## 2018-05-20 DIAGNOSIS — E279 Disorder of adrenal gland, unspecified: Secondary | ICD-10-CM | POA: Diagnosis present

## 2018-05-20 DIAGNOSIS — M5126 Other intervertebral disc displacement, lumbar region: Secondary | ICD-10-CM | POA: Diagnosis present

## 2018-05-20 DIAGNOSIS — M5134 Other intervertebral disc degeneration, thoracic region: Secondary | ICD-10-CM | POA: Diagnosis present

## 2018-05-20 DIAGNOSIS — J9602 Acute respiratory failure with hypercapnia: Secondary | ICD-10-CM | POA: Diagnosis present

## 2018-05-20 DIAGNOSIS — G9341 Metabolic encephalopathy: Secondary | ICD-10-CM | POA: Diagnosis present

## 2018-05-20 DIAGNOSIS — Z8546 Personal history of malignant neoplasm of prostate: Secondary | ICD-10-CM

## 2018-05-20 DIAGNOSIS — E872 Acidosis: Secondary | ICD-10-CM | POA: Diagnosis present

## 2018-05-20 DIAGNOSIS — R4182 Altered mental status, unspecified: Secondary | ICD-10-CM | POA: Diagnosis present

## 2018-05-20 DIAGNOSIS — R451 Restlessness and agitation: Secondary | ICD-10-CM | POA: Diagnosis not present

## 2018-05-20 DIAGNOSIS — Z87891 Personal history of nicotine dependence: Secondary | ICD-10-CM

## 2018-05-20 DIAGNOSIS — J9601 Acute respiratory failure with hypoxia: Secondary | ICD-10-CM | POA: Diagnosis present

## 2018-05-20 DIAGNOSIS — E785 Hyperlipidemia, unspecified: Secondary | ICD-10-CM | POA: Diagnosis present

## 2018-05-20 DIAGNOSIS — I129 Hypertensive chronic kidney disease with stage 1 through stage 4 chronic kidney disease, or unspecified chronic kidney disease: Secondary | ICD-10-CM | POA: Diagnosis present

## 2018-05-20 DIAGNOSIS — M4804 Spinal stenosis, thoracic region: Secondary | ICD-10-CM | POA: Diagnosis present

## 2018-05-20 DIAGNOSIS — Z86010 Personal history of colonic polyps: Secondary | ICD-10-CM

## 2018-05-20 DIAGNOSIS — D539 Nutritional anemia, unspecified: Secondary | ICD-10-CM | POA: Diagnosis present

## 2018-05-20 DIAGNOSIS — D7589 Other specified diseases of blood and blood-forming organs: Secondary | ICD-10-CM | POA: Diagnosis present

## 2018-05-20 DIAGNOSIS — Z79891 Long term (current) use of opiate analgesic: Secondary | ICD-10-CM

## 2018-05-20 DIAGNOSIS — K8020 Calculus of gallbladder without cholecystitis without obstruction: Secondary | ICD-10-CM | POA: Diagnosis present

## 2018-05-20 DIAGNOSIS — Z9079 Acquired absence of other genital organ(s): Secondary | ICD-10-CM

## 2018-05-20 DIAGNOSIS — Z800 Family history of malignant neoplasm of digestive organs: Secondary | ICD-10-CM

## 2018-05-20 DIAGNOSIS — F319 Bipolar disorder, unspecified: Secondary | ICD-10-CM | POA: Diagnosis present

## 2018-05-20 DIAGNOSIS — Z8619 Personal history of other infectious and parasitic diseases: Secondary | ICD-10-CM

## 2018-05-20 DIAGNOSIS — E782 Mixed hyperlipidemia: Secondary | ICD-10-CM | POA: Diagnosis present

## 2018-05-20 DIAGNOSIS — I69354 Hemiplegia and hemiparesis following cerebral infarction affecting left non-dominant side: Secondary | ICD-10-CM

## 2018-05-20 DIAGNOSIS — Z823 Family history of stroke: Secondary | ICD-10-CM

## 2018-05-20 DIAGNOSIS — M48061 Spinal stenosis, lumbar region without neurogenic claudication: Secondary | ICD-10-CM | POA: Diagnosis present

## 2018-05-20 DIAGNOSIS — R443 Hallucinations, unspecified: Secondary | ICD-10-CM | POA: Diagnosis present

## 2018-05-20 LAB — CBC WITH DIFFERENTIAL/PLATELET
ABS IMMATURE GRANULOCYTES: 0.09 10*3/uL — AB (ref 0.00–0.07)
BASOS ABS: 0.1 10*3/uL (ref 0.0–0.1)
BASOS PCT: 1 %
Eosinophils Absolute: 0.3 10*3/uL (ref 0.0–0.5)
Eosinophils Relative: 3 %
HCT: 34.1 % — ABNORMAL LOW (ref 39.0–52.0)
Hemoglobin: 10.6 g/dL — ABNORMAL LOW (ref 13.0–17.0)
IMMATURE GRANULOCYTES: 1 %
Lymphocytes Relative: 14 %
Lymphs Abs: 1.5 10*3/uL (ref 0.7–4.0)
MCH: 30.7 pg (ref 26.0–34.0)
MCHC: 31.1 g/dL (ref 30.0–36.0)
MCV: 98.8 fL (ref 80.0–100.0)
MONOS PCT: 7 %
Monocytes Absolute: 0.8 10*3/uL (ref 0.1–1.0)
NEUTROS ABS: 8.2 10*3/uL — AB (ref 1.7–7.7)
NEUTROS PCT: 74 %
NRBC: 0 % (ref 0.0–0.2)
PLATELETS: 266 10*3/uL (ref 150–400)
RBC: 3.45 MIL/uL — AB (ref 4.22–5.81)
RDW: 15.6 % — ABNORMAL HIGH (ref 11.5–15.5)
WBC: 10.9 10*3/uL — ABNORMAL HIGH (ref 4.0–10.5)

## 2018-05-20 LAB — COMPREHENSIVE METABOLIC PANEL
ALBUMIN: 3.8 g/dL (ref 3.5–5.0)
ALT: 32 U/L (ref 0–44)
ANION GAP: 12 (ref 5–15)
AST: 36 U/L (ref 15–41)
Alkaline Phosphatase: 126 U/L (ref 38–126)
BILIRUBIN TOTAL: 0.4 mg/dL (ref 0.3–1.2)
BUN: 24 mg/dL — ABNORMAL HIGH (ref 8–23)
CO2: 26 mmol/L (ref 22–32)
Calcium: 8.7 mg/dL — ABNORMAL LOW (ref 8.9–10.3)
Chloride: 101 mmol/L (ref 98–111)
Creatinine, Ser: 3.05 mg/dL — ABNORMAL HIGH (ref 0.61–1.24)
GFR calc non Af Amer: 19 mL/min — ABNORMAL LOW (ref >60)
GFR, EST AFRICAN AMERICAN: 22 mL/min — AB (ref >60)
GLUCOSE: 119 mg/dL — AB (ref 70–99)
POTASSIUM: 3.9 mmol/L (ref 3.5–5.1)
SODIUM: 139 mmol/L (ref 135–145)
TOTAL PROTEIN: 6.8 g/dL (ref 6.5–8.1)

## 2018-05-20 LAB — PROTIME-INR
INR: 1.01
PROTHROMBIN TIME: 13.2 s (ref 11.4–15.2)

## 2018-05-20 LAB — URINALYSIS, ROUTINE W REFLEX MICROSCOPIC
BILIRUBIN URINE: NEGATIVE
Glucose, UA: NEGATIVE mg/dL
Hgb urine dipstick: NEGATIVE
Ketones, ur: 15 mg/dL — AB
Leukocytes, UA: NEGATIVE
NITRITE: NEGATIVE
PH: 5.5 (ref 5.0–8.0)
Protein, ur: NEGATIVE mg/dL
Specific Gravity, Urine: 1.025 (ref 1.005–1.030)

## 2018-05-20 LAB — I-STAT CG4 LACTIC ACID, ED
LACTIC ACID, VENOUS: 1.32 mmol/L (ref 0.5–1.9)
LACTIC ACID, VENOUS: 1.16 mmol/L (ref 0.5–1.9)

## 2018-05-20 MED ORDER — SODIUM CHLORIDE 0.9 % IV BOLUS (SEPSIS)
500.0000 mL | Freq: Once | INTRAVENOUS | Status: AC
  Administered 2018-05-20: 500 mL via INTRAVENOUS

## 2018-05-20 MED ORDER — MORPHINE SULFATE (PF) 4 MG/ML IV SOLN
4.0000 mg | Freq: Once | INTRAVENOUS | Status: AC
  Administered 2018-05-20: 4 mg via INTRAVENOUS
  Filled 2018-05-20: qty 1

## 2018-05-20 MED ORDER — SODIUM CHLORIDE 0.9 % IV SOLN
1000.0000 mL | INTRAVENOUS | Status: DC
  Administered 2018-05-20: 1000 mL via INTRAVENOUS

## 2018-05-20 NOTE — ED Notes (Signed)
Report given to Heather, RN

## 2018-05-20 NOTE — ED Notes (Signed)
ED Provider at bedside. 

## 2018-05-20 NOTE — ED Provider Notes (Addendum)
[facility] EMERGENCY DEPARTMENT Provider Note   CSN: [id number] Arrival date & time: 05/20/18  1200     History   Chief Complaint Chief Complaint  Patient presents with  . Urinary Retention    HPI Jason [name] is a 72 y.o. male.  HPI Patient reports that he has been having difficulty urinating for 2 days.  He reports that he has not emptied any urine except a very small amount.  He feels like his lower abdomen is full and tense.  He denies that he has been constipated.  His wife reports that he did have a normal bowel movement yesterday evening.  He has not vomited but feels very nauseated.  Patient denies he had a fever.  Patient's wife reports that just as of today he has started to seemingly have a few hallucinations.  That is not typical for him.  Patient reports he is oriented but just has a couple times seeing something that apparently was not there.  Patient has had chronic lower extremity dysfunction.  He had history of spinal stenosis.  He reports he had lost a lot of sensation to his right foot and lower extremity.  He reports he got surgery about a year ago and it did get him up and walking a little bit with a walker again but he has persistent loss of sensation in the right lower extremity.  He denies that that is new for him.  During the course of the patient's evaluation, he later identified bilateral pedal pain as a problem. Past Medical History:  Diagnosis Date  . Acid reflux disease with ulcer   . AKI (acute kidney injury) ([place])   . Anemia   . Arthritis   . Bipolar 1 disorder ([place])   . Blood transfusion without reported diagnosis   . Carotid stenosis   . Cataract   . Chronic back pain   . Complication of anesthesia 2000   reaction to anesthesia had some swelling   . Depression   . GERD (gastroesophageal reflux disease)   . Hearing loss   . Hyperlipidemia   . Hypertension   . Prostate cancer [facility])    prostate cancer  . PTSD (post-traumatic  stress disorder)   . Stroke ([place]) 08/2015  . Substance abuse ([place])   . TIA (transient ischemic attack)    has had several mini strokes    Patient Active Problem List   Diagnosis Date Noted  . CKD (chronic kidney disease) stage 4, GFR 15-29 ml/min (HCC) 04/16/2018  . DOE (dyspnea on exertion) 01/17/2018  . Solitary pulmonary nodule on lung CT 01/17/2018  . Adrenal incidentaloma ([place])   . Ureteral calculus   . Sepsis ([place]) 12/24/2016  . Acute blood loss anemia   . Melena   . Anemia 10/14/2015  . Hyperglycemia 10/14/2015  . Acid reflux disease with ulcer   . Bleeding gastrointestinal   . Symptomatic anemia   . Upper GI bleeding   . Carotid artery stenosis   . HLD (hyperlipidemia) 08/31/2015  . CVA (cerebral infarction) 08/30/2015  . Degeneration of lumbar or lumbosacral intervertebral disc 05/16/2014  . Weakness 05/03/2014  . Dysarthria 04/30/2014  . AKI (acute kidney injury) ([place]) 04/30/2014  . Hypotension 04/30/2014  . Fall 04/30/2014  . CVA (cerebral vascular accident) ([place]) 04/13/2014  . Stroke ([place]) 04/13/2014  . CAP (community acquired pneumonia) 02/13/2013  . Musculoskeletal pain 02/13/2013  . Hx of completed stroke 02/13/2013  . Bipolar 1 disorder ([place]) 02/13/2013  .  Depression 02/13/2013  . Essential hypertension 02/13/2013  . H/O prostate cancer 02/13/2013  . Nausea with vomiting 02/13/2013  . GERD (gastroesophageal reflux disease) 02/13/2013  . Poor balance 02/13/2013    Past Surgical History:  Procedure Laterality Date  . BACK SURGERY    . CARDIAC CATHETERIZATION     cant remember when  . COLONOSCOPY N/A 10/16/2015   Procedure: COLONOSCOPY;  Surgeon: [name], MD;  Location: [facility] ENDOSCOPY;  Service: Gastroenterology;  Laterality: N/A;  . COLONOSCOPY    . ESOPHAGOGASTRODUODENOSCOPY N/A 10/15/2015   Procedure: ESOPHAGOGASTRODUODENOSCOPY (EGD);  Surgeon: [name], MD;  Location: [facility];  Service: Gastroenterology;   Laterality: N/A;  . EYE SURGERY     cataracts  . GIVENS CAPSULE STUDY N/A 10/16/2015   Procedure: GIVENS CAPSULE STUDY;  Surgeon: [name], MD;  Location: [place];  Service: Gastroenterology;  Laterality: N/A;  . POLYPECTOMY    . PROSTATE SURGERY          Home Medications    Prior to Admission medications   Medication Sig Start Date End Date Taking? Authorizing Provider  amLODipine (NORVASC) 10 MG tablet Take 10 mg by mouth daily.    [provider]  aspirin EC 81 MG tablet Take 81 mg by mouth daily.    [provider]  atorvastatin (LIPITOR) 40 MG tablet Take 1 tablet (40 mg total) by mouth daily at 6 PM. Patient taking differently: Take 40 mg by mouth at bedtime.  04/14/14   Mikhail, Velta Addison, DO  calcium carbonate (OSCAL) 1500 (600 Ca) MG TABS tablet Take 1,200 mg of elemental calcium by mouth at bedtime.    [provider]  Carboxymethylcellulose Sodium (THERATEARS OP) Place 1 drop into both eyes 4 (four) times daily as needed (for dry eyes).     [provider]  ferrous sulfate 325 (65 FE) MG tablet Take 1 tablet (325 mg total) by mouth 2 (two) times daily with a meal. Patient taking differently: Take 650 mg by mouth at bedtime.  10/17/15   Robbie Lis, MD  FLUoxetine (PROZAC) 20 MG capsule Take 60 mg by mouth at bedtime.     [provider]  gabapentin (NEURONTIN) 300 MG capsule Take 1 capsule (300 mg total) by mouth 3 (three) times daily. 01/12/17   Barton Dubois, MD  Glucosamine HCl 1000 MG TABS Take 2,000 mg by mouth at bedtime.     [provider]  losartan (COZAAR) 100 MG tablet Take 100 mg by mouth daily.    [provider]  methocarbamol (ROBAXIN) 750 MG tablet Take 750 mg by mouth every 6 (six) hours as needed for muscle spasms.    [provider]  mirtazapine (REMERON) 45 MG tablet Take 45 mg by mouth at bedtime.    [provider]  Omega-3 Fatty Acids (FISH OIL PO) Take 2  capsules by mouth daily.    [provider]  Oxycodone HCl 10 MG TABS Take 10 mg by mouth 3 (three) times daily as needed (for pain).    [provider]  pantoprazole (PROTONIX) 40 MG tablet Take 1 tablet (40 mg total) by mouth 2 (two) times daily before a meal. 12/28/16   Rai, Vernelle Emerald, MD    Family History Family History  Problem Relation Age of Onset  . Stroke Mother        Hemorrhagic stroke  . Cancer Father        Pancreatic cancer  . Hypertension Father   .  Pancreatic cancer Father   . Colon cancer Neg Hx   . Esophageal cancer Neg Hx   . Liver cancer Neg Hx   . Rectal cancer Neg Hx   . Stomach cancer Neg Hx     Social History Social History   Tobacco Use  . Smoking status: Former Smoker    Packs/day: 1.50    Years: 30.00    Pack years: 45.00    Types: Cigarettes    Last attempt to quit: 03/06/2003    Years since quitting: 15.2  . Smokeless tobacco: Never Used  Substance Use Topics  . Alcohol use: No    Alcohol/week: 0.0 standard drinks    Comment: drank years ago  . Drug use: No     Allergies   Penicillins   Review of Systems Review of Systems  10 Systems reviewed and are negative for acute change except as noted in the HPI.  Physical Exam Updated Vital Signs BP 92/63 (BP Location: Left Arm)   Pulse 79   Temp 98.1 F (36.7 C) (Oral)   Resp 18   Ht 5\' 4"  (1.626 m)   Wt 81.6 kg   SpO2 95%   BMI 30.90 kg/m   Physical Exam  Constitutional: He is oriented to person, place, and time.  Patient appears very uncomfortable.  He is alert and appropriate.  He is answering questions appropriately he does not have respiratory distress.  He is holding his lower abdomen.  HENT:  Head: Normocephalic and atraumatic.  Mouth/Throat: Oropharynx is clear and moist.  Eyes: Pupils are equal, round, and reactive to light. EOM are normal.  Neck: Neck supple.  Cardiovascular: Normal rate, regular rhythm and normal heart sounds.  Bilateral pedal  pulses obtained with hand-held Doppler.  Both present. Bilateral femoral pulses 2+ strong and symmetric to palpation.  Pulmonary/Chest: Effort normal and breath sounds normal.  Abdominal:  Abdomen is slightly firm and feels mildly distended.  No guarding.  Genitourinary:  Genitourinary Comments: Penis is normal in appearance.  No scrotal swelling.  Testicles are nontender to palpation.  Rectal examination shows no fecal impaction.  There are a few small pieces of firm stool that are brown without blood or melena.  Musculoskeletal:  Both feet and lower legs have edema.  Approximately 2+ edema on the right lower extremity 1+ on the left lower extremity.  Edema is pitting.  Patient is wife report that this edema and asymmetry is chronic.  She reports actually at times the swelling is much worse.  Both feet are warm and dry to the touch.  There are no wounds.  Hand-held Doppler used to identify bilateral pedal pulses in the dorsalis pedis.  No mottling or discoloration of the feet or the soles.  Neurological: He is alert and oriented to person, place, and time. No cranial nerve deficit. He exhibits normal muscle tone. Coordination normal.  Patient's wife has noted a couple of random hallucinations today.  Patient at this time is alert and oriented.  He is not showing signs of delirium.  His movements are coordinated purposeful.  He does have at baseline weak lower extremities.  Skin: Skin is warm and dry.  Psychiatric:  Slightly anxious but appropriate.     ED Treatments / Results  Labs (all labs ordered are listed, but only abnormal results are displayed) Labs Reviewed  URINALYSIS, ROUTINE W REFLEX MICROSCOPIC - Abnormal; Notable for the following components:      Result Value   Ketones, ur 15 (*)  All other components within normal limits  COMPREHENSIVE METABOLIC PANEL - Abnormal; Notable for the following components:   Glucose, Bld 119 (*)    BUN 24 (*)    Creatinine, Ser 3.05 (*)     Calcium 8.7 (*)    GFR calc non Af Amer 19 (*)    GFR calc Af Amer 22 (*)    All other components within normal limits  CBC WITH DIFFERENTIAL/PLATELET - Abnormal; Notable for the following components:   WBC 10.9 (*)    RBC 3.45 (*)    Hemoglobin 10.6 (*)    HCT 34.1 (*)    RDW 15.6 (*)    Neutro Abs 8.2 (*)    Abs Immature Granulocytes 0.09 (*)    All other components within normal limits  URINE CULTURE  CULTURE, BLOOD (ROUTINE X 2)  CULTURE, BLOOD (ROUTINE X 2)  I-STAT CG4 LACTIC ACID, ED  I-STAT CG4 LACTIC ACID, ED    EKG None  Radiology Ct Renal Stone Study  Result Date: 05/20/2018 CLINICAL DATA:  Pelvic pain and dysuria for the past 2 days. History of nephrolithiasis and prostate cancer. EXAM: CT ABDOMEN AND PELVIS WITHOUT CONTRAST TECHNIQUE: Multidetector CT imaging of the abdomen and pelvis was performed following the standard protocol without IV contrast. COMPARISON:  12/05/2017 and 12/25/2016. FINDINGS: Lower chest: Stable minimal scarring at the lung bases. Hepatobiliary: No focal liver abnormality is seen. No gallstones, gallbladder wall thickening, or biliary dilatation. Pancreas: Unremarkable. No pancreatic ductal dilatation or surrounding inflammatory changes. Spleen: Normal in size without focal abnormality. Adrenals/Urinary Tract: Stable small left adrenal mass measuring 1.4 x 1.3 cm and 20 Hounsfield units in density on image number 21 series 2. Normal appearing right adrenal gland, kidneys and ureters. Foley catheter in the urinary bladder with an associated small amount of air in the bladder and no significant urine. Stomach/Bowel: Multiple colonic diverticula without evidence of diverticulitis. These are scattered throughout the colon. Normal appearing appendix, small bowel and stomach. Vascular/Lymphatic: Atheromatous arterial calcifications without aneurysm. No enlarged lymph nodes. Reproductive: Surgically absent prostate gland with associated surgical clips. Other:  Small bilateral inguinal hernias containing fat. Partially included right hydrocele. Musculoskeletal: Lumbar and lower thoracic spine degenerative changes. No evidence of bony metastatic disease. IMPRESSION: 1. No acute abnormality. 2. Stable probable small left adrenal adenoma. 3. Colonic diverticulosis. 4. Right hydrocele. Electronically Signed   By: Claudie Revering M.D.   On: 05/20/2018 16:14    Procedures Procedures (including critical care time)  Medications Ordered in ED Medications  sodium chloride 0.9 % bolus 500 mL (500 mLs Intravenous New Bag/Given 05/20/18 1528)    Followed by  0.9 %  sodium chloride infusion (has no administration in time range)  morphine 4 MG/ML injection 4 mg (4 mg Intravenous Given 05/20/18 1533)     Initial Impression / Assessment and Plan / ED Course  I have reviewed the triage vital signs and the nursing notes.  Pertinent labs & imaging results that were available during my care of the patient were reviewed by me and considered in my medical decision making (see chart for details).  With 4mg  of morphine, patient is drowsy, but  when awakened, he continues to report his pain as being severe.  Clinical Course as of May 21 829  Sat May 20, 2018  1644 [facility] hospitalist accepts for admission.   [MP]    Clinical Course User Index [MP] Charlesetta Shanks, MD   Patient presents with complaint of lower abdominal pain and distention.  Patient reports she has history of kidney stones.  Presentation was not highly suggestive of acute kidney stone.  Presentation was suggestive of urinary retention.  Bladder scan did not show large amount of retention.  Foley catheter placed.  Yellow urine obtained.  This did not significantly improve patient's discomfort, proceed with CT abdomen noncontrast due to worsening renal function.  Patient has acute on chronic kidney dysfunction.  He reports significant decrease in urine output.  He does not appear to have significant urinary  retention.  Patient does have lower extremity edema, may be primarily worsening renal function.  This does not explain the patient's abdominal pain.  At this time, cannot obtain contrast CT for better vascular imaging.  Patient does have symmetric and easily palpable femoral pulses and symmetric dopplerable peripheral pulses and CT does not show aneurysm.  Patient may need further arterial studies although at this time does not appear to have immediate ischemic findings.  Plan to admit for worsening renal function with decreased urine output and intractable abdominal pain.  Final Clinical Impressions(s) / ED Diagnoses   Final diagnoses:  AKI (acute kidney injury) ([place])  Lower abdominal pain  Altered mental status, unspecified altered mental status type    ED Discharge Orders    None       Charlesetta Shanks, MD 05/20/18 1615    Charlesetta Shanks, MD 05/20/18 [place], MD 05/21/18 0830

## 2018-05-20 NOTE — ED Notes (Signed)
Pt c/o leg cramps. EDP notified

## 2018-05-20 NOTE — ED Notes (Signed)
Repot given to [facility] at [place]

## 2018-05-20 NOTE — ED Notes (Signed)
Pt states he has not urinated x 2 days. States he has pain in lower to mid abdomen and radiating to right flank and lower back.

## 2018-05-20 NOTE — ED Notes (Signed)
When assessing pts vitals, pts o2 sats were at 87%. Pt instructed to take deep breaths and recovered to 93%. Pt was hypotensive with auto pressure at 93/58. Took manual pressure and bp was 100/60. Applied 2L o2 to patient and informed EDP Pfeiffer.

## 2018-05-20 NOTE — Progress Notes (Signed)
Transfer to [facility] to [place]  Referring physician : Dr Johnney Killian  38 male with lower abdominal pain and ? Urinary retention. Also new confusion and hallucination per wife. Also c/o LE weakness and pain ( which seems chronic with spinal stenosis , had surgery 1 year back). Vitals stable. Labs show creatinine of 3.5 ( baseline 1.5-2). Has good strength and palpable distal pulses. UA, CT renal study unremarkable. Not much urine on bladder scan.  observation to [facility] for AKI on CKD stage 3 and LE pain.

## 2018-05-20 NOTE — ED Notes (Signed)
[facility] arrived to transfer pt to [facility]

## 2018-05-20 NOTE — ED Triage Notes (Signed)
Reports bilateral flank pain x 1.5 days with urinary retention.  Believes this is related to a kidney stone although he has no history of the same.

## 2018-05-21 ENCOUNTER — Observation Stay (HOSPITAL_BASED_OUTPATIENT_CLINIC_OR_DEPARTMENT_OTHER): Payer: Medicare Other

## 2018-05-21 ENCOUNTER — Observation Stay (HOSPITAL_COMMUNITY): Payer: Medicare Other

## 2018-05-21 ENCOUNTER — Encounter (HOSPITAL_COMMUNITY): Payer: Self-pay | Admitting: *Deleted

## 2018-05-21 DIAGNOSIS — G9341 Metabolic encephalopathy: Secondary | ICD-10-CM

## 2018-05-21 DIAGNOSIS — N183 Chronic kidney disease, stage 3 (moderate): Secondary | ICD-10-CM | POA: Diagnosis present

## 2018-05-21 DIAGNOSIS — I69354 Hemiplegia and hemiparesis following cerebral infarction affecting left non-dominant side: Secondary | ICD-10-CM | POA: Diagnosis not present

## 2018-05-21 DIAGNOSIS — E279 Disorder of adrenal gland, unspecified: Secondary | ICD-10-CM | POA: Diagnosis present

## 2018-05-21 DIAGNOSIS — J9602 Acute respiratory failure with hypercapnia: Secondary | ICD-10-CM | POA: Diagnosis present

## 2018-05-21 DIAGNOSIS — R338 Other retention of urine: Secondary | ICD-10-CM | POA: Diagnosis not present

## 2018-05-21 DIAGNOSIS — M5134 Other intervertebral disc degeneration, thoracic region: Secondary | ICD-10-CM | POA: Diagnosis present

## 2018-05-21 DIAGNOSIS — N433 Hydrocele, unspecified: Secondary | ICD-10-CM | POA: Diagnosis present

## 2018-05-21 DIAGNOSIS — J9601 Acute respiratory failure with hypoxia: Secondary | ICD-10-CM | POA: Diagnosis present

## 2018-05-21 DIAGNOSIS — I10 Essential (primary) hypertension: Secondary | ICD-10-CM

## 2018-05-21 DIAGNOSIS — I129 Hypertensive chronic kidney disease with stage 1 through stage 4 chronic kidney disease, or unspecified chronic kidney disease: Secondary | ICD-10-CM | POA: Diagnosis present

## 2018-05-21 DIAGNOSIS — E872 Acidosis: Secondary | ICD-10-CM | POA: Diagnosis present

## 2018-05-21 DIAGNOSIS — M5126 Other intervertebral disc displacement, lumbar region: Secondary | ICD-10-CM | POA: Diagnosis present

## 2018-05-21 DIAGNOSIS — M48061 Spinal stenosis, lumbar region without neurogenic claudication: Secondary | ICD-10-CM | POA: Diagnosis present

## 2018-05-21 DIAGNOSIS — R443 Hallucinations, unspecified: Secondary | ICD-10-CM | POA: Diagnosis present

## 2018-05-21 DIAGNOSIS — R4182 Altered mental status, unspecified: Secondary | ICD-10-CM | POA: Diagnosis present

## 2018-05-21 DIAGNOSIS — R400 Somnolence: Secondary | ICD-10-CM

## 2018-05-21 DIAGNOSIS — K5900 Constipation, unspecified: Secondary | ICD-10-CM | POA: Diagnosis present

## 2018-05-21 DIAGNOSIS — I361 Nonrheumatic tricuspid (valve) insufficiency: Secondary | ICD-10-CM

## 2018-05-21 DIAGNOSIS — N179 Acute kidney failure, unspecified: Secondary | ICD-10-CM | POA: Diagnosis present

## 2018-05-21 DIAGNOSIS — E876 Hypokalemia: Secondary | ICD-10-CM | POA: Diagnosis not present

## 2018-05-21 DIAGNOSIS — E785 Hyperlipidemia, unspecified: Secondary | ICD-10-CM | POA: Diagnosis present

## 2018-05-21 DIAGNOSIS — K219 Gastro-esophageal reflux disease without esophagitis: Secondary | ICD-10-CM | POA: Diagnosis present

## 2018-05-21 DIAGNOSIS — E860 Dehydration: Secondary | ICD-10-CM | POA: Diagnosis present

## 2018-05-21 DIAGNOSIS — K8020 Calculus of gallbladder without cholecystitis without obstruction: Secondary | ICD-10-CM | POA: Diagnosis present

## 2018-05-21 DIAGNOSIS — D539 Nutritional anemia, unspecified: Secondary | ICD-10-CM | POA: Diagnosis present

## 2018-05-21 DIAGNOSIS — D7589 Other specified diseases of blood and blood-forming organs: Secondary | ICD-10-CM | POA: Diagnosis present

## 2018-05-21 DIAGNOSIS — D509 Iron deficiency anemia, unspecified: Secondary | ICD-10-CM | POA: Diagnosis present

## 2018-05-21 DIAGNOSIS — R339 Retention of urine, unspecified: Secondary | ICD-10-CM | POA: Diagnosis present

## 2018-05-21 DIAGNOSIS — M4804 Spinal stenosis, thoracic region: Secondary | ICD-10-CM | POA: Diagnosis present

## 2018-05-21 LAB — CBC
HCT: 33 % — ABNORMAL LOW (ref 39.0–52.0)
HCT: 31.3 % — ABNORMAL LOW (ref 39.0–52.0)
HEMOGLOBIN: 9.1 g/dL — AB (ref 13.0–17.0)
Hemoglobin: 9.6 g/dL — ABNORMAL LOW (ref 13.0–17.0)
MCH: 30.2 pg (ref 26.0–34.0)
MCH: 30.3 pg (ref 26.0–34.0)
MCHC: 29.1 g/dL — AB (ref 30.0–36.0)
MCHC: 29.1 g/dL — ABNORMAL LOW (ref 30.0–36.0)
MCV: 104.1 fL — ABNORMAL HIGH (ref 80.0–100.0)
MCV: 104 fL — ABNORMAL HIGH (ref 80.0–100.0)
NRBC: 0 % (ref 0.0–0.2)
PLATELETS: 237 10*3/uL (ref 150–400)
Platelets: 212 10*3/uL (ref 150–400)
RBC: 3.01 MIL/uL — AB (ref 4.22–5.81)
RBC: 3.17 MIL/uL — AB (ref 4.22–5.81)
RDW: 15.5 % (ref 11.5–15.5)
RDW: 15.4 % (ref 11.5–15.5)
WBC: 10.5 10*3/uL (ref 4.0–10.5)
WBC: 12.7 10*3/uL — AB (ref 4.0–10.5)
nRBC: 0 % (ref 0.0–0.2)

## 2018-05-21 LAB — BLOOD GAS, ARTERIAL
Acid-base deficit: 1.7 mmol/L (ref 0.0–2.0)
Acid-base deficit: 1.2 mmol/L (ref 0.0–2.0)
Bicarbonate: 26.4 mmol/L (ref 20.0–28.0)
Bicarbonate: 25.3 mmol/L (ref 20.0–28.0)
DRAWN BY: 11249
Delivery systems: POSITIVE
Drawn by: 308601
Expiratory PAP: 5
FIO2: 30
Inspiratory PAP: 16
MODE: POSITIVE
O2 Content: 3 L/min
O2 SAT: 94.6 %
O2 Saturation: 91.3 %
PCO2 ART: 54.4 mmHg — AB (ref 32.0–48.0)
Patient temperature: 98.6
Patient temperature: 98.6
pCO2 arterial: 68 mmHg (ref 32.0–48.0)
pH, Arterial: 7.214 — ABNORMAL LOW (ref 7.350–7.450)
pH, Arterial: 7.289 — ABNORMAL LOW (ref 7.350–7.450)
pO2, Arterial: 68.6 mmHg — ABNORMAL LOW (ref 83.0–108.0)
pO2, Arterial: 76.3 mmHg — ABNORMAL LOW (ref 83.0–108.0)

## 2018-05-21 LAB — COMPREHENSIVE METABOLIC PANEL
ALBUMIN: 3.6 g/dL (ref 3.5–5.0)
ALK PHOS: 131 U/L — AB (ref 38–126)
ALT: 29 U/L (ref 0–44)
ANION GAP: 9 (ref 5–15)
AST: 36 U/L (ref 15–41)
BUN: 28 mg/dL — ABNORMAL HIGH (ref 8–23)
CALCIUM: 8 mg/dL — AB (ref 8.9–10.3)
CO2: 25 mmol/L (ref 22–32)
Chloride: 109 mmol/L (ref 98–111)
Creatinine, Ser: 2.59 mg/dL — ABNORMAL HIGH (ref 0.61–1.24)
GFR calc Af Amer: 27 mL/min — ABNORMAL LOW (ref >60)
GFR calc non Af Amer: 23 mL/min — ABNORMAL LOW (ref >60)
GLUCOSE: 98 mg/dL (ref 70–99)
Potassium: 4 mmol/L (ref 3.5–5.1)
SODIUM: 143 mmol/L (ref 135–145)
Total Bilirubin: 0.8 mg/dL (ref 0.3–1.2)
Total Protein: 5.9 g/dL — ABNORMAL LOW (ref 6.5–8.1)

## 2018-05-21 LAB — RAPID URINE DRUG SCREEN, HOSP PERFORMED
AMPHETAMINES: NOT DETECTED
BENZODIAZEPINES: NOT DETECTED
Barbiturates: NOT DETECTED
COCAINE: NOT DETECTED
OPIATES: POSITIVE — AB
TETRAHYDROCANNABINOL: NOT DETECTED

## 2018-05-21 LAB — BASIC METABOLIC PANEL
ANION GAP: 9 (ref 5–15)
BUN: 27 mg/dL — ABNORMAL HIGH (ref 8–23)
CHLORIDE: 107 mmol/L (ref 98–111)
CO2: 26 mmol/L (ref 22–32)
Calcium: 8.1 mg/dL — ABNORMAL LOW (ref 8.9–10.3)
Creatinine, Ser: 2.86 mg/dL — ABNORMAL HIGH (ref 0.61–1.24)
GFR calc Af Amer: 24 mL/min — ABNORMAL LOW (ref >60)
GFR calc non Af Amer: 21 mL/min — ABNORMAL LOW (ref >60)
Glucose, Bld: 102 mg/dL — ABNORMAL HIGH (ref 70–99)
POTASSIUM: 4.2 mmol/L (ref 3.5–5.1)
Sodium: 142 mmol/L (ref 135–145)

## 2018-05-21 LAB — ECHOCARDIOGRAM COMPLETE
HEIGHTINCHES: 64 in
Weight: 3001.78 oz

## 2018-05-21 LAB — MRSA PCR SCREENING: MRSA by PCR: NEGATIVE

## 2018-05-21 MED ORDER — HEPARIN SODIUM (PORCINE) 5000 UNIT/ML IJ SOLN
5000.0000 [IU] | Freq: Three times a day (TID) | INTRAMUSCULAR | Status: DC
  Administered 2018-05-21 – 2018-05-23 (×7): 5000 [IU] via SUBCUTANEOUS
  Filled 2018-05-21 (×6): qty 1

## 2018-05-21 MED ORDER — HALOPERIDOL LACTATE 5 MG/ML IJ SOLN
0.5000 mg | Freq: Four times a day (QID) | INTRAMUSCULAR | Status: DC | PRN
  Administered 2018-05-21: 5 mg via INTRAVENOUS
  Filled 2018-05-21: qty 1

## 2018-05-21 MED ORDER — TRAZODONE HCL 50 MG PO TABS
50.0000 mg | ORAL_TABLET | Freq: Once | ORAL | Status: AC
  Administered 2018-05-21: 50 mg via ORAL
  Filled 2018-05-21: qty 1

## 2018-05-21 MED ORDER — ATORVASTATIN CALCIUM 40 MG PO TABS
40.0000 mg | ORAL_TABLET | Freq: Every day | ORAL | Status: DC
  Administered 2018-05-21 – 2018-05-22 (×2): 40 mg via ORAL
  Filled 2018-05-21 (×2): qty 1

## 2018-05-21 MED ORDER — HALOPERIDOL LACTATE 5 MG/ML IJ SOLN
2.0000 mg | Freq: Four times a day (QID) | INTRAMUSCULAR | Status: DC | PRN
  Administered 2018-05-21 (×2): 2 mg via INTRAVENOUS
  Filled 2018-05-21 (×2): qty 1

## 2018-05-21 MED ORDER — GLUCOSAMINE HCL 1000 MG PO TABS: 2000.0000 mg | ORAL_TABLET | Freq: Every day | ORAL | Status: DC

## 2018-05-21 MED ORDER — POLYETHYLENE GLYCOL 3350 17 G PO PACK
17.0000 g | PACK | Freq: Every day | ORAL | Status: DC
  Administered 2018-05-21: 17 g via ORAL
  Filled 2018-05-21 (×2): qty 1

## 2018-05-21 MED ORDER — GABAPENTIN 300 MG PO CAPS
300.0000 mg | ORAL_CAPSULE | Freq: Three times a day (TID) | ORAL | Status: DC
  Administered 2018-05-21 – 2018-05-23 (×5): 300 mg via ORAL
  Filled 2018-05-21 (×5): qty 1

## 2018-05-21 MED ORDER — POLYVINYL ALCOHOL 1.4 % OP SOLN: Freq: Four times a day (QID) | OPHTHALMIC | Status: DC | PRN

## 2018-05-21 MED ORDER — LIP MEDEX EX OINT
TOPICAL_OINTMENT | CUTANEOUS | Status: AC
  Administered 2018-05-21: 11:00:00
  Filled 2018-05-21: qty 7

## 2018-05-21 MED ORDER — MIRTAZAPINE 15 MG PO TABS
45.0000 mg | ORAL_TABLET | Freq: Every day | ORAL | Status: DC
  Administered 2018-05-21 – 2018-05-22 (×2): 45 mg via ORAL
  Filled 2018-05-21 (×2): qty 3

## 2018-05-21 MED ORDER — CALCIUM CARBONATE 1250 (500 CA) MG PO TABS
1200.0000 mg | ORAL_TABLET | Freq: Every day | ORAL | Status: DC
  Administered 2018-05-21 – 2018-05-22 (×2): 1250 mg via ORAL
  Filled 2018-05-21 (×4): qty 1

## 2018-05-21 MED ORDER — ONDANSETRON HCL 4 MG PO TABS: 4.0000 mg | ORAL_TABLET | Freq: Four times a day (QID) | ORAL | Status: DC | PRN

## 2018-05-21 MED ORDER — ONDANSETRON HCL 4 MG/2ML IJ SOLN: 4.0000 mg | Freq: Four times a day (QID) | INTRAMUSCULAR | Status: DC | PRN

## 2018-05-21 MED ORDER — ASPIRIN EC 81 MG PO TBEC
81.0000 mg | DELAYED_RELEASE_TABLET | Freq: Every day | ORAL | Status: DC
  Administered 2018-05-21 – 2018-05-23 (×2): 81 mg via ORAL
  Filled 2018-05-21 (×2): qty 1

## 2018-05-21 MED ORDER — SODIUM CHLORIDE 0.9 % IV SOLN
INTRAVENOUS | Status: DC
  Administered 2018-05-21 – 2018-05-22 (×5): via INTRAVENOUS

## 2018-05-21 MED ORDER — OXYCODONE-ACETAMINOPHEN 5-325 MG PO TABS
1.0000 | ORAL_TABLET | Freq: Three times a day (TID) | ORAL | Status: DC | PRN
  Administered 2018-05-21: 1 via ORAL
  Filled 2018-05-21: qty 1

## 2018-05-21 MED ORDER — PANTOPRAZOLE SODIUM 40 MG PO TBEC
40.0000 mg | DELAYED_RELEASE_TABLET | Freq: Two times a day (BID) | ORAL | Status: DC
  Administered 2018-05-21 – 2018-05-23 (×2): 40 mg via ORAL
  Filled 2018-05-21 (×2): qty 1

## 2018-05-21 MED ORDER — SENNOSIDES-DOCUSATE SODIUM 8.6-50 MG PO TABS
1.0000 | ORAL_TABLET | Freq: Two times a day (BID) | ORAL | Status: DC
  Administered 2018-05-21 – 2018-05-23 (×4): 1 via ORAL
  Filled 2018-05-21 (×4): qty 1

## 2018-05-21 MED ORDER — FLUOXETINE HCL 20 MG PO CAPS
60.0000 mg | ORAL_CAPSULE | Freq: Every day | ORAL | Status: DC
  Administered 2018-05-21 – 2018-05-23 (×2): 60 mg via ORAL
  Filled 2018-05-21 (×2): qty 3

## 2018-05-21 MED ORDER — FLUTICASONE PROPIONATE 50 MCG/ACT NA SUSP
2.0000 | Freq: Every day | NASAL | Status: DC
  Administered 2018-05-21: 2 via NASAL
  Filled 2018-05-21: qty 16

## 2018-05-21 MED ORDER — HALOPERIDOL LACTATE 5 MG/ML IJ SOLN: 5.0000 mg | Freq: Four times a day (QID) | INTRAMUSCULAR | Status: DC | PRN

## 2018-05-21 NOTE — Care Management Note (Signed)
Case Management Note  Patient Details  Name: Jason [name] MRN: [id number] Date of Birth: 1945-11-20  Subjective/Objective:         Acute metabolic encephalopathy, urinary retention           Action/Plan: Spoke to pt and wife at bedside. Pt was independent at home until he started with confusion and inability to urinate. Will continue to follow for dc needs.  Expected Discharge Date:                  Expected Discharge Plan:  Home/Self Care  In-House Referral:  Clinical Social Work  Discharge planning Services  CM Consult   Status of Service:  In process, will continue to follow  If discussed at Long Length of Stay Meetings, dates discussed:    Additional Comments:  Erenest Rasher, RN 05/21/2018, 12:48 PM

## 2018-05-21 NOTE — Progress Notes (Signed)
Upon assessment at 2200 patient was difficult to arouse. Pt stated he didn't know where he was and his speech was incomprehensible at times. Pt presented with muscle jerking in his lower extremities and tremors in his upper extremities which was not noted to be his baseline. Pt was at first unable to follow commands to control his arms and seemed to have L sided weakness. Pt began to show active movement in all extremities and grasped strongly with both hands. Oxygen increased to 3L when pt's SpO2 dropped. Pt blood pressure reading unreliable as pt did not remain still. On call provider notified and rapid response called.

## 2018-05-21 NOTE — Progress Notes (Signed)
ABG results critical. RT recommends bipap in stepdown. On call provider notified and transfer orders obtained. Vitals stable.Transferred pt and belongings to 1222. Wife called but not able to reach.

## 2018-05-21 NOTE — Progress Notes (Signed)
  Echocardiogram 2D Echocardiogram has been performed.  Jasraj Lappe G Maxime Beckner 05/21/2018, 1:14 PM
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
## 2018-05-21 NOTE — Progress Notes (Signed)
PHARMACIST - PHYSICIAN ORDER COMMUNICATION  CONCERNING: P&T Medication Policy on Herbal Medications  DESCRIPTION:  This patient's order for:  glucosamine  has been noted.  This product(s) is classified as an "herbal" or natural product. Due to a lack of definitive safety studies or FDA approval, nonstandard manufacturing practices, plus the potential risk of unknown drug-drug interactions while on inpatient medications, the Pharmacy and Therapeutics Committee does not permit the use of "herbal" or natural products of this type within [facility].   ACTION TAKEN: The pharmacy department is unable to verify this order at this time. Please reevaluate patient's clinical condition at discharge and address if the herbal or natural product(s) should be resumed at that time.  Ulice Dash, PharmD Clinical Pharmacist Pager # [phone]

## 2018-05-21 NOTE — Care Management Obs Status (Signed)
[place] NOTIFICATION   Patient Details  Name: Jason [name] MRN: [id number] Date of Birth: 1945-08-23   Medicare Observation Status Notification Given:  Yes    Erenest Rasher, RN 05/21/2018, 12:47 PM

## 2018-05-21 NOTE — Significant Event (Signed)
Rapid Response Event Note  Overview:   called at 2225 Arrived at 2230  Event type neurologic Called d/t pt having confusion and tremors.  Initial Focused Assessment:  Found pt to answer questions appropriately.  Follow commands but somewhat sleepy but easily aroused VSS. (b/p 112/68, sats 97% on 3 lnc, RR 15, HR 100 Sinus Rhythm on monitor).  Pt pupils equal and reactive, grips are equal and following commands, no facial droop observed.  Denies pain.  Pt speech wasn't very clear at times.   Interventions:  Pt speech much clearer after fully awakened, Md made aware (No CT of head needed at this time) per MD.  MD at bedside, pt now snoring ABG ordered.  Transfer pending lab results.  Dyann Ruddle
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
## 2018-05-21 NOTE — Progress Notes (Signed)
Vital signs stabilized and tremors decreasing. Pt alert and oriented to person and place, disoriented to time, but speech still abnormal at times. Pupils equal and reactive. Active movements in all extremities. At the request of the rapid response nurse, on call notified and Dr. Jonelle Sidle called to assess pt. Orders placed for ABG to assess cause of confusion. Will continue to monitor. Awaiting results.
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
## 2018-05-21 NOTE — H&P (Signed)
History and Physical   Jason [name] RFF:[id number] DOB: 03/17/1946 DOA: 05/20/2018  Referring MD/NP/PA: Dr. Johnney Killian  PCP: [facility]   Patient coming from: [place]  Chief Complaint: Difficulty urinating and leg pain  HPI: Jason [name] is a 72 y.o. male with medical history significant of bipolar disorder, chronic kidney disease stage III, GERD, prostate cancer, hypertension, hyperlipidemia, carotid stenosis, previous CVA as well as chronic back pain who was seen at [facility] with complaint of difficulty urinating.  He was examined and found to have a full lower abdomen but not much urine in his bladder.  He was having some hallucinations.  He also has bilateral leg pain for which he received 3 mg of morphine.  On arrival here patient was stuporous.  He had garbled speech in the beginning but currently back to baseline.  He is arousable and able to answer all questions.  His renal function is notably worsening with his baseline creatinine in the twos currently 3.05.  Patient also has leukocytosis with microcytic anemia.  He appears to have significant hypercarbia and mild hypoxemia.  He is sleeping and snoring worrisome for obstructive sleep apnea.  ED Course: In the ED he has temperature 98.6 blood pressure 151/78 his pulse is 97 respiratory rate of 18 oxygen sats 94% on 2 L.  White count is 12.7 hemoglobin 9.6 with platelets 237.  He is BUN is 24 creatinine 3.04 and glucose 119 calcium 8.7.  CT renal shows no acute findings.  Review of Systems: As per HPI otherwise 10 point review of systems negative.    Past Medical History:  Diagnosis Date  . Acid reflux disease with ulcer   . AKI (acute kidney injury) ([place])   . Anemia   . Arthritis   . Bipolar 1 disorder ([place])   . Blood transfusion without reported diagnosis   . Carotid stenosis   . Cataract   . Chronic back pain   . Complication of anesthesia 2000   reaction to anesthesia had some  swelling   . Depression   . GERD (gastroesophageal reflux disease)   . Hearing loss   . Hyperlipidemia   . Hypertension   . Prostate cancer [facility])    prostate cancer  . PTSD (post-traumatic stress disorder)   . Stroke ([place]) 08/2015  . Substance abuse ([place])   . TIA (transient ischemic attack)    has had several mini strokes    Past Surgical History:  Procedure Laterality Date  . BACK SURGERY    . CARDIAC CATHETERIZATION     cant remember when  . COLONOSCOPY N/A 10/16/2015   Procedure: COLONOSCOPY;  Surgeon: [name], MD;  Location: [facility] ENDOSCOPY;  Service: Gastroenterology;  Laterality: N/A;  . COLONOSCOPY    . ESOPHAGOGASTRODUODENOSCOPY N/A 10/15/2015   Procedure: ESOPHAGOGASTRODUODENOSCOPY (EGD);  Surgeon: [name], MD;  Location: [place];  Service: Gastroenterology;  Laterality: N/A;  . EYE SURGERY     cataracts  . GIVENS CAPSULE STUDY N/A 10/16/2015   Procedure: GIVENS CAPSULE STUDY;  Surgeon: [name], MD;  Location: [place];  Service: Gastroenterology;  Laterality: N/A;  . POLYPECTOMY    . PROSTATE SURGERY       reports that he quit smoking about 15 years ago. His smoking use included cigarettes. He has a 45.00 pack-year smoking history. He has never used smokeless tobacco. He reports that he does not drink alcohol or use drugs.  Allergies  Allergen  Reactions  . Penicillins Other (See Comments)    Reaction:  Unknown  Has patient had a PCN reaction causing immediate rash, facial/tongue/throat swelling, SOB or lightheadedness with hypotension: Unknown Has patient had a PCN reaction causing severe rash involving mucus membranes or skin necrosis: Unknown Has patient had a PCN reaction that required hospitalization: Unknown Has patient had a PCN reaction occurring within the last 10 years: No If all of the above answers are "NO", then may proceed with Cephalosporin use.    Family History  Problem Relation Age of Onset  .  Stroke Mother        Hemorrhagic stroke  . Cancer Father        Pancreatic cancer  . Hypertension Father   . Pancreatic cancer Father   . Colon cancer Neg Hx   . Esophageal cancer Neg Hx   . Liver cancer Neg Hx   . Rectal cancer Neg Hx   . Stomach cancer Neg Hx      Prior to Admission medications   Medication Sig Start Date End Date Taking? Authorizing Provider  amLODipine (NORVASC) 10 MG tablet Take 10 mg by mouth daily.   Yes [provider]  atorvastatin (LIPITOR) 40 MG tablet Take 1 tablet (40 mg total) by mouth daily at 6 PM. Patient taking differently: Take 40 mg by mouth at bedtime.  04/14/14  Yes Mikhail, [place], DO  ferrous sulfate 325 (65 FE) MG tablet Take 1 tablet (325 mg total) by mouth 2 (two) times daily with a meal. Patient taking differently: Take 325 mg by mouth 2 (two) times daily.  10/17/15  Yes Robbie Lis, MD  FLUoxetine (PROZAC) 20 MG capsule Take 60 mg by mouth daily.    Yes [provider]  lisinopril (PRINIVIL,ZESTRIL) 20 MG tablet Take 20 mg by mouth daily.   Yes [provider]  mirtazapine (REMERON) 45 MG tablet Take 45 mg by mouth at bedtime.   Yes [provider]  Omega-3 Fatty Acids (FISH OIL PO) Take 1 capsule by mouth 2 (two) times daily.    Yes [provider]  omeprazole (PRILOSEC) 40 MG capsule Take 40 mg by mouth 2 (two) times daily.   Yes [provider]  Oxycodone HCl 10 MG TABS Take 10 mg by mouth 3 (three) times daily as needed (for pain).   Yes [provider]  prazosin (MINIPRESS) 1 MG capsule Take 3 mg by mouth at bedtime.   Yes [provider]  salsalate (DISALCID) 750 MG tablet Take 1,500 mg by mouth 2 (two) times daily as needed (for pain).   Yes [provider]  aspirin EC 81 MG tablet Take 81 mg by mouth daily.    [provider]  calcium carbonate (OSCAL) 1500 (600 Ca) MG TABS tablet Take 1,200 mg of elemental calcium by mouth at bedtime.     [provider]  Carboxymethylcellulose Sodium (THERATEARS OP) Place 1 drop into both eyes 4 (four) times daily as needed (for dry eyes).     [provider]  gabapentin (NEURONTIN) 300 MG capsule Take 1 capsule (300 mg total) by mouth 3 (three) times daily. 01/12/17   Barton Dubois, MD  Glucosamine HCl 1000 MG TABS Take 2,000 mg by mouth at bedtime.     [provider]  losartan (COZAAR) 100 MG tablet Take 100 mg by mouth daily.    [provider]  methocarbamol (ROBAXIN) 750 MG tablet Take 750 mg by mouth every 6 (six)  hours as needed for muscle spasms.    [provider]  pantoprazole (PROTONIX) 40 MG tablet Take 1 tablet (40 mg total) by mouth 2 (two) times daily before a meal. 12/28/16   Rai, Vernelle Emerald, MD    Physical Exam: Vitals:   05/20/18 1746 05/20/18 1843 05/20/18 2048 05/20/18 2200  BP: 113/89 117/65 (!) 151/78   Pulse: 97 92 97   Resp:  16 12   Temp:  98.4 F (36.9 C)  98.6 F (37 C)  TempSrc:  Oral  Oral  SpO2: 96% 94% 100%   Weight:  84.5 kg    Height:          Constitutional: NAD, calm, comfortable, sleeping with loud snoring Vitals:   05/20/18 1746 05/20/18 1843 05/20/18 2048 05/20/18 2200  BP: 113/89 117/65 (!) 151/78   Pulse: 97 92 97   Resp:  16 12   Temp:  98.4 F (36.9 C)  98.6 F (37 C)  TempSrc:  Oral  Oral  SpO2: 96% 94% 100%   Weight:  84.5 kg    Height:       Eyes: PERRL, lids and conjunctivae normal ENMT: Mucous membranes are moist. Posterior pharynx clear of any exudate or lesions.Normal dentition.  Neck: normal, supple, no masses, no thyromegaly Respiratory: clear to auscultation bilaterally, no wheezing, no crackles. Normal respiratory effort. No accessory muscle use.  Cardiovascular: Regular rate and rhythm, no murmurs / rubs / gallops. No extremity edema. 2+ pedal pulses. No carotid bruits.  Abdomen: no tenderness, no masses palpated. No hepatosplenomegaly. Bowel sounds positive.    Musculoskeletal: no clubbing / cyanosis. No joint deformity upper and lower extremities. Good ROM, no contractures. Normal muscle tone.  Skin: no rashes, lesions, ulcers. No induration Neurologic: CN 2-12 grossly intact. Sensation intact, DTR normal. Strength 5/5 in all 4.  Psychiatric: Confused sleepy but arousable    Labs on Admission: I have personally reviewed following labs and imaging studies  CBC: Recent Labs  Lab 05/20/18 1421 05/21/18 0038  WBC 10.9* 12.7*  NEUTROABS 8.2*  --   HGB 10.6* 9.6*  HCT 34.1* 33.0*  MCV 98.8 104.1*  PLT 266 166   Basic Metabolic Panel: Recent Labs  Lab 05/20/18 1421  NA 139  K 3.9  CL 101  CO2 26  GLUCOSE 119*  BUN 24*  CREATININE 3.05*  CALCIUM 8.7*   GFR: Estimated Creatinine Clearance: 21.8 mL/min (A) (by C-G formula based on SCr of 3.05 mg/dL (H)). Liver Function Tests: Recent Labs  Lab 05/20/18 1421  AST 36  ALT 32  ALKPHOS 126  BILITOT 0.4  PROT 6.8  ALBUMIN 3.8   No results for input(s): LIPASE, AMYLASE in the last 168 hours. No results for input(s): AMMONIA in the last 168 hours. Coagulation Profile: Recent Labs  Lab 05/20/18 1655  INR 1.01   Cardiac Enzymes: No results for input(s): CKTOTAL, CKMB, CKMBINDEX, TROPONINI in the last 168 hours. BNP (last 3 results) No results for input(s): PROBNP in the last 8760 hours. HbA1C: No results for input(s): HGBA1C in the last 72 hours. CBG: No results for input(s): GLUCAP in the last 168 hours. Lipid Profile: No results for input(s): CHOL, HDL, LDLCALC, TRIG, CHOLHDL, LDLDIRECT in the last 72 hours. Thyroid Function Tests: No results for input(s): TSH, T4TOTAL, FREET4, T3FREE, THYROIDAB in the last 72 hours. Anemia Panel: No results for input(s): VITAMINB12, FOLATE, FERRITIN, TIBC, IRON, RETICCTPCT in the last 72 hours. Urine analysis:    Component Value Date/Time  COLORURINE YELLOW 05/20/2018 [place] 05/20/2018 1336   LABSPEC 1.025  05/20/2018 1336   PHURINE 5.5 05/20/2018 1336   GLUCOSEU NEGATIVE 05/20/2018 1336   HGBUR NEGATIVE 05/20/2018 [place] 05/20/2018 1336   KETONESUR 15 (A) 05/20/2018 1336   PROTEINUR NEGATIVE 05/20/2018 1336   UROBILINOGEN 0.2 05/09/2014 2115   NITRITE NEGATIVE 05/20/2018 1336   LEUKOCYTESUR NEGATIVE 05/20/2018 1336   Sepsis Labs: @LABRCNTIP (procalcitonin:4,lacticidven:4) )No results found for this or any previous visit (from the past 240 hour(s)).   Radiological Exams on Admission: Ct Renal Stone Study  Result Date: 05/20/2018 CLINICAL DATA:  Pelvic pain and dysuria for the past 2 days. History of nephrolithiasis and prostate cancer. EXAM: CT ABDOMEN AND PELVIS WITHOUT CONTRAST TECHNIQUE: Multidetector CT imaging of the abdomen and pelvis was performed following the standard protocol without IV contrast. COMPARISON:  12/05/2017 and 12/25/2016. FINDINGS: Lower chest: Stable minimal scarring at the lung bases. Hepatobiliary: No focal liver abnormality is seen. No gallstones, gallbladder wall thickening, or biliary dilatation. Pancreas: Unremarkable. No pancreatic ductal dilatation or surrounding inflammatory changes. Spleen: Normal in size without focal abnormality. Adrenals/Urinary Tract: Stable small left adrenal mass measuring 1.4 x 1.3 cm and 20 Hounsfield units in density on image number 21 series 2. Normal appearing right adrenal gland, kidneys and ureters. Foley catheter in the urinary bladder with an associated small amount of air in the bladder and no significant urine. Stomach/Bowel: Multiple colonic diverticula without evidence of diverticulitis. These are scattered throughout the colon. Normal appearing appendix, small bowel and stomach. Vascular/Lymphatic: Atheromatous arterial calcifications without aneurysm. No enlarged lymph nodes. Reproductive: Surgically absent prostate gland with associated surgical clips. Other: Small bilateral inguinal hernias containing fat.  Partially included right hydrocele. Musculoskeletal: Lumbar and lower thoracic spine degenerative changes. No evidence of bony metastatic disease. IMPRESSION: 1. No acute abnormality. 2. Stable probable small left adrenal adenoma. 3. Colonic diverticulosis. 4. Right hydrocele. Electronically Signed   By: Claudie Revering M.D.   On: 05/20/2018 16:14    EKG: Independently reviewed.  It shows sinus rhythm with a rate of 82.  No significant other findings.  Assessment/Plan Principal Problem:   Acute renal failure superimposed on stage 3 chronic kidney disease (HCC) Active Problems:   Essential hypertension   H/O prostate cancer   HLD (hyperlipidemia)   CKD (chronic kidney disease) stage 4, GFR 15-29 ml/min (HCC)   Acute respiratory failure with hypoxia and hypercarbia (HCC)   Altered mental status     #1 acute on chronic renal failure: Most likely dehydration.  Will hydrate patient and recheck in the morning.  So far no evidence of obstruction from CT urogram.  #2 altered mental status: Patient has previous history of stroke.  He however has a lot of metabolic phenomena going on which glucose is altered mental status.  Additionally he received morphine prior to arrival.  Will observe patient overnight if no improvement will have to get a head CT or even MRI of the brain.  #3 acute respiratory failure with hypercarbia: Patient's ABG showed PCO2 of 68 and PO2 of 65.  He follows up with pulmonology recently.  I have not seen a diagnosis of COPD done.  We will consider BiPAP for the patient if his mental status continues to not improve.  He is currently on oxygen by nasal cannula and appears to be keeping his oxygen saturation up  #4 hypertension: Continue blood pressure control.  #5 hyperlipidemia: Continue with statin.  #6 bipolar disorder: Resume  home regimen when awake.   DVT prophylaxis: Heparin Code Status: Full code Family Communication: No family at bedside Disposition Plan: To be  determined Consults called: None Admission status: Inpatient  Severity of Illness: The appropriate patient status for this patient is INPATIENT. Inpatient status is judged to be reasonable and necessary in order to provide the required intensity of service to ensure the patient's safety. The patient's presenting symptoms, physical exam findings, and initial radiographic and laboratory data in the context of their chronic comorbidities is felt to place them at high risk for further clinical deterioration. Furthermore, it is not anticipated that the patient will be medically stable for discharge from the hospital within 2 midnights of admission. The following factors support the patient status of inpatient.   " The patient's presenting symptoms include altered mental status and urinary symptoms. " The worrisome physical exam findings include confusion. " The initial radiographic and laboratory data are worrisome because of worsening renal failure. " The chronic co-morbidities include chronic kidney disease to and prostate cancer.   * I certify that at the point of admission it is my clinical judgment that the patient will require inpatient hospital care spanning beyond 2 midnights from the point of admission due to high intensity of service, high risk for further deterioration and high frequency of surveillance required.Barbette Merino MD [facility] Pager [phone]  If 7PM-7AM, please contact night-coverage [email or web address] Password [email or web address]  05/21/2018, 1:26 AM

## 2018-05-22 ENCOUNTER — Ambulatory Visit (HOSPITAL_COMMUNITY)
Admit: 2018-05-22 | Discharge: 2018-05-22 | Disposition: A | Discharge status: Home / Self Care | Payer: Medicare Other | Attending: Internal Medicine | Admitting: Internal Medicine

## 2018-05-22 ENCOUNTER — Encounter (HOSPITAL_COMMUNITY)
Admission: EM | Disposition: A | Discharge status: Home / Self Care | Payer: Self-pay | Source: Home / Self Care | Attending: Internal Medicine

## 2018-05-22 ENCOUNTER — Inpatient Hospital Stay (HOSPITAL_COMMUNITY): Payer: Medicare Other | Admitting: Anesthesiology

## 2018-05-22 DIAGNOSIS — R338 Other retention of urine: Secondary | ICD-10-CM

## 2018-05-22 DIAGNOSIS — N179 Acute kidney failure, unspecified: Principal | ICD-10-CM

## 2018-05-22 HISTORY — PX: RADIOLOGY WITH ANESTHESIA: SHX6223

## 2018-05-22 LAB — BASIC METABOLIC PANEL
ANION GAP: 10 (ref 5–15)
BUN: 15 mg/dL (ref 8–23)
CHLORIDE: 110 mmol/L (ref 98–111)
CO2: 24 mmol/L (ref 22–32)
Calcium: 8.6 mg/dL — ABNORMAL LOW (ref 8.9–10.3)
Creatinine, Ser: 1.35 mg/dL — ABNORMAL HIGH (ref 0.61–1.24)
GFR calc Af Amer: 59 mL/min — ABNORMAL LOW (ref >60)
GFR, EST NON AFRICAN AMERICAN: 51 mL/min — AB (ref >60)
GLUCOSE: 82 mg/dL (ref 70–99)
POTASSIUM: 3.6 mmol/L (ref 3.5–5.1)
Sodium: 144 mmol/L (ref 135–145)

## 2018-05-22 LAB — CBC WITH DIFFERENTIAL/PLATELET
ABS IMMATURE GRANULOCYTES: 0.06 10*3/uL (ref 0.00–0.07)
BASOS PCT: 1 %
Basophils Absolute: 0 10*3/uL (ref 0.0–0.1)
Eosinophils Absolute: 0.2 10*3/uL (ref 0.0–0.5)
Eosinophils Relative: 3 %
HEMATOCRIT: 33.6 % — AB (ref 39.0–52.0)
HEMOGLOBIN: 10.2 g/dL — AB (ref 13.0–17.0)
IMMATURE GRANULOCYTES: 1 %
LYMPHS ABS: 1.3 10*3/uL (ref 0.7–4.0)
LYMPHS PCT: 16 %
MCH: 30.4 pg (ref 26.0–34.0)
MCHC: 30.4 g/dL (ref 30.0–36.0)
MCV: 100.3 fL — AB (ref 80.0–100.0)
MONOS PCT: 6 %
Monocytes Absolute: 0.5 10*3/uL (ref 0.1–1.0)
NEUTROS ABS: 6.2 10*3/uL (ref 1.7–7.7)
Neutrophils Relative %: 73 %
PLATELETS: 197 10*3/uL (ref 150–400)
RBC: 3.35 MIL/uL — ABNORMAL LOW (ref 4.22–5.81)
RDW: 14.9 % (ref 11.5–15.5)
WBC: 8.4 10*3/uL (ref 4.0–10.5)
nRBC: 0 % (ref 0.0–0.2)

## 2018-05-22 LAB — URINE CULTURE: Culture: NO GROWTH

## 2018-05-22 LAB — RETICULOCYTES
Immature Retic Fract: 13.5 % (ref 2.3–15.9)
RBC.: 3.35 MIL/uL — AB (ref 4.22–5.81)
RETIC COUNT ABSOLUTE: 67.3 10*3/uL (ref 19.0–186.0)
Retic Ct Pct: 2 % (ref 0.4–3.1)

## 2018-05-22 LAB — FOLATE: Folate: 8 ng/mL (ref >5.9)

## 2018-05-22 LAB — IRON AND TIBC
IRON: 32 ug/dL — AB (ref 45–182)
SATURATION RATIOS: 9 % — AB (ref 17.9–39.5)
TIBC: 341 ug/dL (ref 250–450)
UIBC: 309 ug/dL

## 2018-05-22 LAB — HIV ANTIBODY (ROUTINE TESTING W REFLEX): HIV Screen 4th Generation wRfx: NONREACTIVE

## 2018-05-22 LAB — TSH: TSH: 0.748 u[IU]/mL (ref 0.350–4.500)

## 2018-05-22 LAB — RPR: RPR Ser Ql: NONREACTIVE

## 2018-05-22 LAB — MAGNESIUM: MAGNESIUM: 1.9 mg/dL (ref 1.7–2.4)

## 2018-05-22 LAB — VITAMIN B12: VITAMIN B 12: 284 pg/mL (ref 180–914)

## 2018-05-22 SURGERY — MRI WITH ANESTHESIA
Anesthesia: General

## 2018-05-22 MED ORDER — ONDANSETRON HCL 4 MG/2ML IJ SOLN
INTRAMUSCULAR | Status: DC | PRN
  Administered 2018-05-22: 4 mg via INTRAVENOUS

## 2018-05-22 MED ORDER — LIDOCAINE 2% (20 MG/ML) 5 ML SYRINGE
INTRAMUSCULAR | Status: DC | PRN
  Administered 2018-05-22: 80 mg via INTRAVENOUS

## 2018-05-22 MED ORDER — ROCURONIUM BROMIDE 50 MG/5ML IV SOSY
PREFILLED_SYRINGE | INTRAVENOUS | Status: DC | PRN
  Administered 2018-05-22: 50 mg via INTRAVENOUS

## 2018-05-22 MED ORDER — VITAMIN B-12 1000 MCG PO TABS
1000.0000 ug | ORAL_TABLET | Freq: Every day | ORAL | Status: DC
  Administered 2018-05-23: 1000 ug via ORAL
  Filled 2018-05-22: qty 1

## 2018-05-22 MED ORDER — SUGAMMADEX SODIUM 200 MG/2ML IV SOLN
INTRAVENOUS | Status: DC | PRN
  Administered 2018-05-22: 300 mg via INTRAVENOUS

## 2018-05-22 MED ORDER — FENTANYL CITRATE (PF) 100 MCG/2ML IJ SOLN
INTRAMUSCULAR | Status: DC | PRN
  Administered 2018-05-22: 50 ug via INTRAVENOUS

## 2018-05-22 MED ORDER — LACTATED RINGERS IV SOLN
INTRAVENOUS | Status: DC | PRN
  Administered 2018-05-22: 13:00:00 via INTRAVENOUS

## 2018-05-22 MED ORDER — TAMSULOSIN HCL 0.4 MG PO CAPS
0.4000 mg | ORAL_CAPSULE | Freq: Every day | ORAL | Status: DC
  Administered 2018-05-22 – 2018-05-23 (×2): 0.4 mg via ORAL
  Filled 2018-05-22 (×2): qty 1

## 2018-05-22 MED ORDER — PROPOFOL 10 MG/ML IV BOLUS
INTRAVENOUS | Status: DC | PRN
  Administered 2018-05-22: 120 mg via INTRAVENOUS

## 2018-05-22 MED ORDER — FERROUS SULFATE 325 (65 FE) MG PO TABS: 325.0000 mg | ORAL_TABLET | ORAL | Status: DC

## 2018-05-22 MED ORDER — PHENYLEPHRINE 40 MCG/ML (10ML) SYRINGE FOR IV PUSH (FOR BLOOD PRESSURE SUPPORT)
PREFILLED_SYRINGE | INTRAVENOUS | Status: DC | PRN
  Administered 2018-05-22 (×3): 80 ug via INTRAVENOUS

## 2018-05-22 NOTE — Progress Notes (Signed)
[facility] at bedside and report given. RN will call [address] at [facility] to notify of transport arrival.

## 2018-05-22 NOTE — Anesthesia Preprocedure Evaluation (Addendum)
Anesthesia Evaluation  Patient identified by MRN, date of birth, ID band Patient awake    Reviewed: Allergy & Precautions, NPO status , Patient's Chart, lab work & pertinent test results  Airway Mallampati: II  TM Distance: >3 FB Neck ROM: Full    Dental  (+) Dental Advidsory Given, Teeth Intact   Pulmonary neg pulmonary ROS, former smoker,    breath sounds clear to auscultation       Cardiovascular hypertension, + DOE   Rhythm:Regular Rate:Normal  TTE 05/2018 EF 60-65%, no significant valvular abnormalities    Neuro/Psych PSYCHIATRIC DISORDERS Anxiety Depression Bipolar Disorder TIACVA (left sided weakness), Residual Symptoms    GI/Hepatic Neg liver ROS, PUD, GERD  ,  Endo/Other  negative endocrine ROS  Renal/GU Renal InsufficiencyRenal disease  negative genitourinary   Musculoskeletal  (+) Arthritis , Osteoarthritis,    Abdominal   Peds  Hematology  (+) Blood dyscrasia, anemia ,   Anesthesia Other Findings Presented 2 days ago with urinary retention and AMS. MRI to evaluate lumbar nerve compression leading to urinary retention.  Reproductive/Obstetrics                          Anesthesia Physical Anesthesia Plan  ASA: III  Anesthesia Plan: General   Post-op Pain Management:    Induction: Intravenous  PONV Risk Score and Plan: 2 and Ondansetron, Dexamethasone and Treatment may vary due to age or medical condition  Airway Management Planned: Oral ETT  Additional Equipment:   Intra-op Plan:   Post-operative Plan: Extubation in OR  Informed Consent: I have reviewed the patients History and Physical, chart, labs and discussed the procedure including the risks, benefits and alternatives for the proposed anesthesia with the patient or authorized representative who has indicated his/her understanding and acceptance.   Dental advisory given and Dental Advisory Given  Plan Discussed  with: CRNA  Anesthesia Plan Comments:        Anesthesia Quick Evaluation

## 2018-05-22 NOTE — Progress Notes (Signed)
PROGRESS NOTE  KELLEY KNOTH LSL:[id number] DOB: May 02, 1946 DOA: 05/20/2018 PCP: [facility]  Brief Summary:  Pt states he has not urinated x 71 days. States he has pain in lower to mid abdomen and radiating to right flank and lower back.  Transferred to [facility] from [facility] Upon arrival  "Upon assessment at 2200 patient was difficult to arouse. Pt stated he didn't know where he was and his speech was incomprehensible at times. Pt presented with muscle jerking in his lower extremities and tremors in his upper extremities which was not noted to be his baseline. Pt was at first unable to follow commands to control his arms and seemed to have L sided weakness. Pt began to show active movement in all extremities and grasped strongly with both hands. Oxygen increased to 3L when pt's SpO2 dropped. Pt blood pressure reading unreliable as pt did not remain still. On call provider notified and rapid response called"   HPI/Recap of past 24 hours:  Much improved, no confusion, no agitation, no fever He is to get mri lumber spine at [facility] today Foley remain in place, cr improved    Assessment/Plan: Principal Problem:   Acute renal failure superimposed on stage 3 chronic kidney disease ([place]) Active Problems:   Essential hypertension   H/O prostate cancer   HLD (hyperlipidemia)   CKD (chronic kidney disease) stage 4, GFR 15-29 ml/min (HCC)   Acute respiratory failure with hypoxia and hypercarbia (HCC)   Altered mental status  Acute Metabolic encephalopathy with hallucinations/agitation -Blood culture no growth, ua unremarkable,  -Per chart review he recently was seen in ED for left leg cellulitis, he was prescribed keflex -no apparent cellulitis on legs -Wbc peaked at 12.7,today at 8.4 , no fever since admission -Per wife patient has hallucination at home which is not his norm, per patient he has increased back pain required more pain meds in the last few days, then  developed urinary retention, Wife confirmed patient does not drink alcohol -acute metabolic encephalopathy possibly due to pain/narcotics/urinary retention/dehydration/aki,  -CT head "Stable chronic ischemic disease.  No acute intracranial abnormality" -no apparent infection, mri lumbar spine no suspicion for infection - uds + opioids (he take narcotic chronically) -mental status has much improved, he is now at baseline   Acute hypoxic/hypercapnea  respiratory failure from narcotics?  Resolved,  cxr "Mild bronchitic changes.",  lung exam unremarkable, no cough, no chest pain  Severe constipation: Visualized on CT scan Start stool regimen   Acute urinary retention: Not able to urinate for two days prior to coming to the hospital Foley in place Start flomax, Voiding trial in am  AKI on CKDIII No obstruction on ct scan ua clean but concentrated, he is dehydrated on exam with dry oral mucosa Continue hydration  Losartan and lisinopril both listed at home meds? Will hold both.  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   H/o prostate cancer s/p resection  Right sided hydrocele, denies pain  Small left adrenal mass, stable   H/o bipolar/SI in the  past Continue home meds prozac Confusion has resolved  Body mass index is 32.2 kg/m.  Patient has improved, vital signs are stable, tele has been sinus rhythm, d/c tele.  Code Status: full  Family Communication: patient  Disposition Plan: med surg   Consultants:  none  Procedures:  none  Antibiotics:  none   Objective: BP 138/75 (BP Location: Right Arm)   Pulse 100   Temp 98.5 F (36.9 C) (Oral)   Resp 20   Ht 5\' 4"  (1.626 m)   Wt 85.1 kg   SpO2 99%   BMI 32.20 kg/m   Intake/Output Summary (Last 24 hours) at 05/22/2018 1756 Last data filed at 05/22/2018 1614 Gross per 24 hour  Intake 1568.45 ml  Output 2375 ml  Net -806.55 ml   Filed Weights   05/20/18 1207 05/20/18 1843 05/21/18 0212  Weight: 81.6 kg 84.5 kg 85.1 kg    Exam: Patient is examined daily including today on 05/22/2018, exams remain the same as of yesterday except that has changed    General:  Aaox3, much improved, foley in place with clear urine  Cardiovascular: RRR  Respiratory: CTABL  Abdomen: Soft/ND/NT, positive BS  Musculoskeletal: No Edema  Neuro: aaox3  Data Reviewed: Basic Metabolic Panel: Recent Labs  Lab 05/20/18 1421 05/21/18 0038 05/21/18 0556 05/22/18 0617  NA 139 142 143 144  K 3.9 4.2 4.0 3.6  CL 101 107 109 110  CO2 26 26 25 24   GLUCOSE 119* 102* 98 82  BUN 24* 27* 28* 15  CREATININE 3.05* 2.86* 2.59* 1.35*  CALCIUM 8.7* 8.1* 8.0* 8.6*  MG  --   --   --  1.9   Liver Function Tests: Recent Labs  Lab 05/20/18 1421 05/21/18 0556  AST 36 36  ALT 32 29  ALKPHOS 126 131*  BILITOT 0.4 0.8  PROT 6.8 5.9*  ALBUMIN 3.8 3.6   No results for input(s): LIPASE, AMYLASE in the last 168 hours. No results for input(s): AMMONIA in the last 168 hours. CBC: Recent Labs  Lab 05/20/18 1421 05/21/18 0038 05/21/18 0556 05/22/18 0617  WBC 10.9* 12.7* 10.5 8.4  NEUTROABS 8.2*  --   --  6.2  HGB 10.6* 9.6* 9.1* 10.2*  HCT 34.1* 33.0* 31.3* 33.6*  MCV  98.8 104.1* 104.0* 100.3*  PLT 266 237 212 197   Cardiac Enzymes:   No results for input(s): CKTOTAL, CKMB, CKMBINDEX, TROPONINI in the last 168 hours. BNP (last 3 results) No results for input(s): BNP in the last 8760 hours.  ProBNP (last 3 results) No results for input(s): PROBNP in the last 8760 hours.  CBG: No results for input(s): GLUCAP in the last 168 hours.  Recent Results (from the past 240 hour(s))  Urine culture     Status: None   Collection Time: 05/20/18  1:36 PM  Result Value Ref Range Status   Specimen Description   Final    URINE, CATHETERIZED Performed at [facility], [address] [place], [place] [address]    Special Requests   Final    NONE Performed at [facility], [address] [place], [place] [address]    Culture   Final    NO GROWTH  Performed at [facility], [place] [address], [place], [place] [address]    Report Status 05/22/2018 FINAL  Final  Culture, blood (routine x 2)     Status: None (Preliminary result)   Collection Time: 05/20/18  3:00 PM  Result Value Ref Range Status   Specimen Description   Final    BLOOD RIGHT HAND Performed at [facility], [address] [place], [place] [address]    Special Requests   Final    BOTTLES DRAWN AEROBIC AND ANAEROBIC Blood Culture adequate volume Performed at [facility], [address] [place], [place] [address]    Culture   Final    NO GROWTH 2 DAYS Performed at [facility], [place] [address] [place], [place] [address]    Report Status PENDING  Incomplete  Culture, blood (routine x 2)     Status: None (Preliminary result)   Collection Time: 05/20/18  3:07 PM  Result Value Ref Range Status   Specimen Description   Final    BLOOD RIGHT ARM Performed at [facility], [address] [place], [place] [address]    Special Requests   Final    BOTTLES DRAWN AEROBIC AND ANAEROBIC Blood Culture adequate volume Performed at [facility], [address] [place], [place] [address]    Culture   Final    NO GROWTH 2 DAYS Performed at [facility], [place] [address], [place], [place] [address]    Report Status PENDING  Incomplete  MRSA PCR Screening     Status: None   Collection Time: 05/21/18  2:08 AM  Result Value Ref Range Status   MRSA by PCR NEGATIVE NEGATIVE Final    Comment:        The GeneXpert MRSA Assay (FDA approved for NASAL specimens only), is one component of a comprehensive MRSA colonization surveillance program. It is not intended to diagnose MRSA infection nor to guide or monitor treatment for MRSA infections. Performed at [facility], [place] [address], [place], [place] [address]      Studies: Mr Lumbar Spine Wo Contrast  Result Date: 05/22/2018 CLINICAL DATA:  72 year old male with back pain, urinary retention, gait instability, lower leg weakness. History of nephrolithiasis and prostate cancer. Prior lumbar spine surgery EXAM: MRI LUMBAR SPINE WITHOUT CONTRAST TECHNIQUE: Multiplanar, multisequence MR imaging of the lumbar spine was performed. No intravenous contrast was administered. COMPARISON:  CT Abdomen and Pelvis 05/20/2018, 12/05/2017, 01/09/2017. Lumbar MRI 05/25/2014. Chest abdomen and pelvis CT 12/25/2016. FINDINGS: Segmentation: Transitional anatomy with 12 pairs of full size ribs and sacralized L5 level on the 2018 chest abdomen and pelvis study. Vestigial L5-S1 disc space. Correlation with radiographs is recommended prior to any operative intervention. Alignment: Stable since 2015. Mild grade 1 anterolisthesis of L3 on L4 with relatively preserved lumbar lordosis. Vertebrae: Postoperative changes to the posterior elements L1-L2 through L3-L4, details below. No marrow edema or evidence of acute osseous abnormality. Background bone marrow signal is normal. No evidence of osseous metastatic disease. Intact visible sacrum and SI joints. Conus medullaris and  cauda equina: Conus extends to the T12 level. No lower spinal cord or conus signal abnormality. Paraspinal and other soft tissues: Postoperative changes to the posterior paraspinal soft tissues with no postoperative fluid collection. Cholelithiasis suspected on series 6, image 18. Trace pelvic free fluid is visible on series 4, image 7 but nonspecific. Otherwise stable visible abdominal viscera  from the recent CT Abdomen and Pelvis. Disc levels: T9-T10: Spinal stenosis with mild to moderate spinal cord mass effect (series 6, image 3) related to broad-based posterior disc bulge or protrusion plus moderate to severe facet and ligament flavum hypertrophy. There is moderate to severe left greater than right T9 foraminal stenosis. No definite spinal cord signal abnormality. T10-T11: Mild spinal stenosis related to moderate to severe posterior element hypertrophy. Mild if any cord mass effect. Moderate to severe left T10 foraminal stenosis. T11-T12: Mild facet hypertrophy. T12-L1:  Mild to moderate facet hypertrophy. L1-L2: Previous midline laminectomy. Progressed and severe residual facet hypertrophy with degenerative bilateral facet joint fluid. Circumferential disc bulge with broad-based posterior component appears mildly increased since 2015. Increased and moderate spinal stenosis since the preoperative MRI (series 6, image 27). Mild to moderate L1 foraminal stenosis greater on the right appears increased. L2-L3: Previous midline laminectomy with severe residual facet hypertrophy. Bilateral facet joint fluid and an associated right side 8 millimeter synovial cyst (series 6, image 33). Mild circumferential disc bulge with broad-based posterior component. Chronic spinal stenosis here now appears mildly improved from the preoperative MRI although there is severe right lateral recess stenosis in part related to the right synovial cyst (right L3 nerve level). Mild to moderate L2 foraminal stenosis appears improved. L3-L4:  Mild anterolisthesis. Previous midline laminectomy with severe residual facet hypertrophy. Circumferential disc bulge with new or increased right foraminal disc extrusion since 2015 (series 3, image 3). No significant spinal or lateral recess stenosis but there is moderate to severe right L3 foraminal stenosis. L4-L5: Severe facet hypertrophy is stable. Mild disc bulging. No spinal or lateral recess stenosis. Mild to moderate left and mild right L4 foraminal stenosis is stable. L5-S1:  Sacralized and negative. IMPRESSION: 1. Transitional anatomy with sacralized L5-S1 level. Correlation with radiographs is recommended prior to any operative intervention. 2. No metastatic disease identified in the noncontrast lumbar spine. There is nonspecific trace pelvic free fluid. 3. Previous midline laminectomy L1-L2 through L3-L4 although severe residual facet hypertrophy at each level. Moderate spinal stenosis at L1-L2 is increased from the preoperative MRI in 2015. L2-L3 spinal stenosis is mildly improved but there is an 8 millimeter right lateral recess synovial cyst with severe right L3 nerve level stenosis. No L3-L4 spinal stenosis but new or increased right foraminal disc extrusion also moderately to severely affecting the right L3 nerve. 4. Lower thoracic spinal stenosis at T9-T10 due to combined disc and posterior element degeneration with mild to moderate spinal cord mass effect, no definite cord signal abnormality. Moderate lower thoracic foraminal stenosis. 5. Cholelithiasis. Electronically Signed   By: Genevie Ann M.D.   On: 05/22/2018 15:05    Scheduled Meds: . aspirin EC  81 mg Oral Daily  . atorvastatin  40 mg Oral QHS  . calcium carbonate  1,250 mg of elemental calcium Oral QHS  . FLUoxetine  60 mg Oral Daily  . fluticasone  2 spray Each Nare Daily  . gabapentin  300 mg Oral TID  . heparin  5,000 Units Subcutaneous Q8H  . mirtazapine  45 mg Oral QHS  . pantoprazole  40 mg Oral BID AC  . polyethylene  glycol  17 g Oral Daily  . senna-docusate  1 tablet Oral BID    Continuous Infusions: . sodium chloride 100 mL/hr at 05/22/18 0555     Time spent: 59mins I have personally reviewed and interpreted on  05/22/2018 daily labs, tele strips, imagings as discussed above under date review session and  assessment and plans.  I reviewed all nursing notes, pharmacy notes,  vitals, pertinent old records  I have discussed plan of care as described above with RN , patient on 05/22/2018   Florencia Reasons MD, PhD  [facility] Pager [phone]. If 7PM-7AM, please contact night-coverage at [email or web address], password [facility] 05/22/2018, 5:56 PM  LOS: 1 day

## 2018-05-22 NOTE — Transfer of Care (Signed)
Immediate Anesthesia Transfer of Care Note  Patient: Jason [name]  Procedure(s) Performed: MRI WITH ANESTHESIA/LUMBAR SPINE (N/A )  Patient Location: PACU  Anesthesia Type:General  Level of Consciousness: awake and patient cooperative  Airway & Oxygen Therapy: Patient Spontanous Breathing and Patient connected to nasal cannula oxygen  Post-op Assessment: Report given to RN, Post -op Vital signs reviewed and stable and Patient moving all extremities X 4  Post vital signs: Reviewed and stable  Last Vitals:  Vitals Value Taken Time  BP 131/61 05/22/2018  2:53 PM  Temp 36.7 C 05/22/2018  2:53 PM  Pulse 96 05/22/2018  2:59 PM  Resp 27 05/22/2018  2:59 PM  SpO2 95 % 05/22/2018  2:59 PM  Vitals shown include unvalidated device data.  Last Pain:  Vitals:   05/22/18 1453  TempSrc:   PainSc: 0-No pain      Patients Stated Pain Goal: 0 (09/81/19 1478)  Complications: No apparent anesthesia complications

## 2018-05-22 NOTE — Progress Notes (Signed)
[facility] at 1130. [facility] has been scheduled. Patients wife states to call her for verbal consent once [facility]. MRI aware.

## 2018-05-23 ENCOUNTER — Encounter (HOSPITAL_COMMUNITY): Payer: Self-pay | Admitting: Radiology

## 2018-05-23 DIAGNOSIS — E876 Hypokalemia: Secondary | ICD-10-CM

## 2018-05-23 LAB — CBC WITH DIFFERENTIAL/PLATELET
Abs Immature Granulocytes: 0.07 10*3/uL (ref 0.00–0.07)
BASOS PCT: 1 %
Basophils Absolute: 0 10*3/uL (ref 0.0–0.1)
EOS ABS: 0.1 10*3/uL (ref 0.0–0.5)
Eosinophils Relative: 1 %
HCT: 30.2 % — ABNORMAL LOW (ref 39.0–52.0)
Hemoglobin: 9.2 g/dL — ABNORMAL LOW (ref 13.0–17.0)
Immature Granulocytes: 1 %
Lymphocytes Relative: 11 %
Lymphs Abs: 0.8 10*3/uL (ref 0.7–4.0)
MCH: 30 pg (ref 26.0–34.0)
MCHC: 30.5 g/dL (ref 30.0–36.0)
MCV: 98.4 fL (ref 80.0–100.0)
MONO ABS: 0.6 10*3/uL (ref 0.1–1.0)
MONOS PCT: 7 %
Neutro Abs: 5.9 10*3/uL (ref 1.7–7.7)
Neutrophils Relative %: 79 %
PLATELETS: 215 10*3/uL (ref 150–400)
RBC: 3.07 MIL/uL — AB (ref 4.22–5.81)
RDW: 15 % (ref 11.5–15.5)
WBC: 7.5 10*3/uL (ref 4.0–10.5)
nRBC: 0 % (ref 0.0–0.2)

## 2018-05-23 LAB — BASIC METABOLIC PANEL
Anion gap: 9 (ref 5–15)
BUN: 10 mg/dL (ref 8–23)
CALCIUM: 8.5 mg/dL — AB (ref 8.9–10.3)
CO2: 23 mmol/L (ref 22–32)
Chloride: 112 mmol/L — ABNORMAL HIGH (ref 98–111)
Creatinine, Ser: 1.2 mg/dL (ref 0.61–1.24)
GFR calc Af Amer: >60 mL/min (ref >60)
GFR, EST NON AFRICAN AMERICAN: 59 mL/min — AB (ref >60)
GLUCOSE: 94 mg/dL (ref 70–99)
POTASSIUM: 3.6 mmol/L (ref 3.5–5.1)
SODIUM: 144 mmol/L (ref 135–145)

## 2018-05-23 LAB — MAGNESIUM: Magnesium: 1.7 mg/dL (ref 1.7–2.4)

## 2018-05-23 MED ORDER — MAGNESIUM SULFATE IN D5W 1-5 GM/100ML-% IV SOLN
1.0000 g | Freq: Once | INTRAVENOUS | Status: AC
  Administered 2018-05-23: 1 g via INTRAVENOUS
  Filled 2018-05-23: qty 100

## 2018-05-23 MED ORDER — POLYETHYLENE GLYCOL 3350 17 G PO PACK: 17.0000 g | PACK | Freq: Every day | ORAL | 0 refills | Status: DC

## 2018-05-23 MED ORDER — CYANOCOBALAMIN 1000 MCG PO TABS: 1000.0000 ug | ORAL_TABLET | Freq: Every day | ORAL | 0 refills | Status: DC

## 2018-05-23 MED ORDER — SENNOSIDES-DOCUSATE SODIUM 8.6-50 MG PO TABS: 1.0000 | ORAL_TABLET | Freq: Every day | ORAL | 0 refills | Status: DC

## 2018-05-23 MED ORDER — POTASSIUM CHLORIDE CRYS ER 20 MEQ PO TBCR
40.0000 meq | EXTENDED_RELEASE_TABLET | Freq: Once | ORAL | Status: AC
  Administered 2018-05-23: 40 meq via ORAL
  Filled 2018-05-23: qty 2

## 2018-05-23 MED ORDER — TAMSULOSIN HCL 0.4 MG PO CAPS: 0.4000 mg | ORAL_CAPSULE | Freq: Every day | ORAL | 0 refills | Status: AC

## 2018-05-23 MED ORDER — FERROUS SULFATE 325 (65 FE) MG PO TABS: 325.0000 mg | ORAL_TABLET | ORAL | 0 refills | Status: DC

## 2018-05-23 NOTE — Care Management Note (Signed)
Case Management Note  Patient Details  Name: Jason [name] MRN: [id number] Date of Birth: [birth year]  Subjective/Objective:                  Discharge planning  Action/Plan: Discharged to home with self-care, orders checked for hhc needs. No CM needs present at time of discharge.  Expected Discharge Date:  05/23/18               Expected Discharge Plan:  Home/Self Care  In-House Referral:  Clinical Social Work  Discharge planning Services  CM Consult  Post Acute Care Choice:    Choice offered to:     DME Arranged:    DME Agency:     HH Arranged:    HH Agency:     Status of Service:  Completed, signed off  If discussed at [employer], dates discussed:    Additional Comments:  Leeroy Cha, RN 05/23/2018, 12:42 PM

## 2018-05-23 NOTE — Evaluation (Signed)
Physical Therapy Evaluation Patient Details Name: Jason [name] MRN: [id number] DOB: 08/31/1945 Today's Date: 05/23/2018   History of Present Illness  72 YO male admitted 11/16 with Acute Metabolic encephalopathy with hallucinations/agitation, acute hyoxia, AKI, urinary retention, H/O stroke, PTSD, bipolar D/O.  Clinical Impression  The patient is near his baseline. Has a  Stair glide. No further PT needs.     Follow Up Recommendations No PT follow up    Equipment Recommendations  Cane    Recommendations for Other Services       Precautions / Restrictions Precautions Precautions: Fall Restrictions Weight Bearing Restrictions: No      Mobility  Bed Mobility Overal bed mobility: Independent                Transfers Overall transfer level: Needs assistance Equipment used: None Transfers: Sit to/from Stand Sit to Stand: Supervision            Ambulation/Gait Ambulation/Gait assistance: Supervision Gait Distance (Feet): 100 Feet   Gait Pattern/deviations: Step-through pattern Gait velocity: wfl   General Gait Details: lightly held to IV pole  Stairs            Wheelchair Mobility    Modified Rankin (Stroke Patients Only)       Balance                                             Pertinent Vitals/Pain Pain Assessment: No/denies pain    Home Living Family/patient expects to be discharged to:: Private residence Living Arrangements: Spouse/significant other Available Help at Discharge: Family;Available 24 hours/day Type of Home: House Home Access: Stairs to enter   [employer] of Steps: 1 Home Layout: Two level Home Equipment: Walker - 4 wheels;Walker - 2 wheels;Cane - single point      Prior Function Level of Independence: Needs assistance   Gait / Transfers Assistance Needed: uses cane and stair glide           Hand Dominance        Extremity/Trunk Assessment   Upper Extremity  Assessment Upper Extremity Assessment: Overall WFL for tasks assessed    Lower Extremity Assessment Lower Extremity Assessment: Overall WFL for tasks assessed    Cervical / Trunk Assessment Cervical / Trunk Assessment: Normal  Communication      Cognition Arousal/Alertness: Awake/alert Behavior During Therapy: WFL for tasks assessed/performed Overall Cognitive Status: Within Functional Limits for tasks assessed                                        General Comments      Exercises     Assessment/Plan    PT Assessment Patent does not need any further PT services  PT Problem List         PT Treatment Interventions      PT Goals (Current goals can be found in the Care Plan section)  Acute Rehab PT Goals Patient Stated Goal: to go home today PT Goal Formulation: All assessment and education complete, DC therapy    Frequency     Barriers to discharge        Co-evaluation               AM-PAC PT "6 Clicks" Daily Activity  Outcome Measure Difficulty turning over  in bed (including adjusting bedclothes, sheets and blankets)?: None Difficulty moving from lying on back to sitting on the side of the bed? : None Difficulty sitting down on and standing up from a chair with arms (e.g., wheelchair, bedside commode, etc,.)?: A Little Help needed moving to and from a bed to chair (including a wheelchair)?: A Little Help needed walking in hospital room?: A Little Help needed climbing 3-5 steps with a railing? : A Lot 6 Click Score: 19    End of Session   Activity Tolerance: Patient tolerated treatment well Patient left: in bed;with call bell/phone within reach;with bed alarm set Nurse Communication: Mobility status PT Visit Diagnosis: Unsteadiness on feet (R26.81)    Time: 1119-1130 PT Time Calculation (min) (ACUTE ONLY): 11 min   Charges:   PT Evaluation $PT Eval Low Complexity: [employer] Pager [phone] Office [phone]   Claretha Cooper 05/23/2018, 11:38 AM

## 2018-05-23 NOTE — Discharge Summary (Signed)
Discharge Summary  Jason [name]:[id number] DOB: 1945-11-07  PCP: [facility]  Admit date: 05/20/2018 Discharge date: 05/23/2018  Time spent: 51mins, more than 50% time spent on coordination of care.  Recommendations for Outpatient Follow-up:  1. F/u with PCP within a week  for hospital discharge follow up, repeat cbc/bmp at follow up. 2. F/u with spine surgery 3. F/u with urology  Discharge Diagnoses:  Active Hospital Problems   Diagnosis Date Noted  . Acute renal failure superimposed on stage 3 chronic kidney disease ([place]) 05/20/2018  . Acute respiratory failure with hypoxia and hypercarbia ([place]) 05/21/2018  . Altered mental status 05/21/2018  . CKD (chronic kidney disease) stage 4, GFR 15-29 ml/min (HCC) 04/16/2018  . HLD (hyperlipidemia) 08/31/2015  . H/O prostate cancer 02/13/2013  . Essential hypertension 02/13/2013    Resolved Hospital Problems  No resolved problems to display.    Discharge Condition: stable  Diet recommendation: heart healthy  Filed Weights   05/20/18 1207 05/20/18 1843 05/21/18 0212  Weight: 81.6 kg 84.5 kg 85.1 kg    History of present illness:  [facility]: [facility]   Patient coming from: [place]  Chief Complaint: Difficulty urinating and leg pain  HPI: Jason [name] is a 72 y.o. male with medical history significant of bipolar disorder, chronic kidney disease stage III, GERD, prostate cancer, hypertension, hyperlipidemia, carotid stenosis, previous CVA as well as chronic back pain who was seen at [facility] with complaint of difficulty urinating.  He was examined and found to have a full lower abdomen but not much urine in his bladder.  He was having some hallucinations.  He also has bilateral leg pain for which he received 3 mg of morphine.  On arrival here patient was stuporous.  He had garbled speech in the beginning but currently back to baseline.  He is arousable and able to  answer all questions.  His renal function is notably worsening with his baseline creatinine in the twos currently 3.05.  Patient also has leukocytosis with microcytic anemia.  He appears to have significant hypercarbia and mild hypoxemia.  He is sleeping and snoring worrisome for obstructive sleep apnea.  ED Course: In the ED he has temperature 98.6 blood pressure 151/78 his pulse is 97 respiratory rate of 18 oxygen sats 94% on 2 L.  White count is 12.7 hemoglobin 9.6 with platelets 237.  He is BUN is 24 creatinine 3.04 and glucose 119 calcium 8.7.  CT renal shows no acute findings.  Hospital Course:  Principal Problem:   Acute renal failure superimposed on stage 3 chronic kidney disease (HCC) Active Problems:   Essential hypertension   H/O prostate cancer   HLD (hyperlipidemia)   CKD (chronic kidney disease) stage 4, GFR 15-29 ml/min (HCC)   Acute respiratory failure with hypoxia and hypercarbia (HCC)   Altered mental status   Acute Metabolic encephalopathy with hallucinations/agitation, resolved  -Blood culture no growth, ua unremarkable,  -Per chart review he recently he was seen in ED for left leg cellulitis on 10/13, he was prescribed keflex -no apparent cellulitis on legs -Wbc peaked at 12.7,today at 7.5 , no fever since admission -Per wife patient has hallucination at home which is not his norm, per patient he has increased back pain required more pain meds in the last few days, then developed urinary retention, Wife confirmed patient does not drink alcohol -acute metabolic encephalopathy possibly due to pain/narcotics/urinary retention/dehydration/aki,  -CT head "Stable chronic ischemic disease.  No acute intracranial abnormality" -MRI lumbar spine no suspicion for infection, detail please see original reports below - uds + opioids (he take narcotic chronically) -mental status has much improved, he is now at baseline   Acute hypoxic/hypercapnea  respiratory failure from  narcotics?  Resolved,  cxr "Mild bronchitic changes.",  lung exam unremarkable, no cough, no chest pain  Severe constipation: improved  Visualized on CT scan Had bm with  stool regimen, discharged on miralax/sennakot    Acute urinary retention: resolved Not able to urinate for two days prior to coming to the hospital Foley in place Start flomax, he did well with Voiding trial   AKI on CKDIII, resolved No obstruction on ct scan ua clean but concentrated, he is dehydrated on exam with dry oral mucosa Cr 3.05 on presentation, cr improved with hydration, cr 1.2 at discharge Home meds lisinopril held in the hospital, resumed at discharge  f/u with pcp  Macrocytic Anemia tibili wnl, liver /spleen unremarkable on Ct ab Macrocytosis,  b12 low normal, start b12 supplement, folate unremarkable, Wife confirmed patient does not drink alcohol iron panel with low iron, start iron supplement, H/o gi avm's, currently no overt bleeding, f/u with pcp    H/oCVA /carotid stenosis  with residual left side weakness Continue asa/statin  HTN:  hold home bp meds norvasc/lisinopril due to bp initially low bp increasing, resume home meds norvasc and lisinopril He reports not taking prazosin or losartan at home.   s/p L2-5 decompression on 04/22/2017 On oxycodone, neurontin  wife reports patient has progressive low back pain for the last several months, it started to affect his gait, they agreed to do mri lumber spine , but wife reports patient has to get mri under anesthesia, mri lumbar spine ordered to be done under anesthesia. IMPRESSION: 1. Transitional anatomy with sacralized L5-S1 level. Correlation with radiographs is recommended prior to any operative intervention. 2. No metastatic disease identified in the noncontrast lumbar spine. There is nonspecific trace pelvic free fluid. 3. Previous midline laminectomy L1-L2 through L3-L4 although severe residual facet hypertrophy at  each level. Moderate spinal stenosis at L1-L2 is increased from the preoperative MRI in 2015. L2-L3 spinal stenosis is mildly improved but there is an 8 millimeter right lateral recess synovial cyst with severe right L3 nerve level stenosis. No L3-L4 spinal stenosis but new or increased right foraminal disc extrusion also moderately to severely affecting the right L3 nerve.  4. Lower thoracic spinal stenosis at T9-T10 due to combined disc and posterior element degeneration with mild to moderate spinal cord mass effect, no definite cord signal abnormality. Moderate lower thoracic foraminal stenosis. 5. Cholelithiasis.  F/u with spine surgery he did well with physical therapy prior to discharge  H/o prostate cancer s/p resection , report done in [place] prior to relocating to [place] He does reports follow with a local urologist, he is advised to continue follow urology  Right sided hydrocele, denies pain F/u with urology  Known Small left adrenal mass, stable  F/u with pcp  H/o bipolar/SI in the past Continue home meds prozac Confusion has resolved  Body mass index is 32.2 kg/m.   Code Status: full  Family Communication: patient  Disposition Plan: home   Consultants:  none  Procedures:  MRI lumber spine under anesthesia on 11/18  Antibiotics:  none   Discharge Exam: BP (!) 157/77 (BP Location: Left Arm)   Pulse 88   Temp 98.9 F (37.2 C) (Oral)   Resp 20   Ht 5'  4" (1.626 m)   Wt 85.1 kg   SpO2 96%   BMI 32.20 kg/m    General:  Aaox3, much improved, reports now back to baseline  Cardiovascular: RRR  Respiratory: CTABL  Abdomen: Soft/ND/NT, positive BS  Musculoskeletal: No Edema  Neuro: aaox3   Discharge Instructions You were cared for by a hospitalist during your hospital stay. If you have any questions about your discharge medications or the care you received while you were in the hospital after you are discharged,  you can call the unit and asked to speak with the hospitalist on call if the hospitalist that took care of you is not available. Once you are discharged, your primary care physician will handle any further medical issues. Please note that NO REFILLS for any discharge medications will be authorized once you are discharged, as it is imperative that you return to your primary care physician (or establish a relationship with a primary care physician if you do not have one) for your aftercare needs so that they can reassess your need for medications and monitor your lab values.  Discharge Instructions    Diet - low sodium heart healthy   Complete by:  As directed    Increase activity slowly   Complete by:  As directed      Allergies as of 05/23/2018      Reactions   Penicillins Other (See Comments)   Reaction:  Unknown  Has patient had a PCN reaction causing immediate rash, facial/tongue/throat swelling, SOB or lightheadedness with hypotension: Unknown Has patient had a PCN reaction causing severe rash involving mucus membranes or skin necrosis: Unknown Has patient had a PCN reaction that required hospitalization: Unknown Has patient had a PCN reaction occurring within the last 10 years: No If all of the above answers are "NO", then may proceed with Cephalosporin use.      Medication List    TAKE these medications   amLODipine 10 MG tablet Commonly known as:  NORVASC Take 10 mg by mouth daily.   aspirin EC 81 MG tablet Take 81 mg by mouth daily.   atorvastatin 40 MG tablet Commonly known as:  LIPITOR Take 1 tablet (40 mg total) by mouth daily at 6 PM. What changed:  when to take this   calcium carbonate 1500 (600 Ca) MG Tabs tablet Commonly known as:  OSCAL Take 1,200 mg of elemental calcium by mouth at bedtime.   cyanocobalamin 1000 MCG tablet Take 1 tablet (1,000 mcg total) by mouth daily.   ferrous sulfate 325 (65 FE) MG tablet Take 1 tablet (325 mg total) by mouth every  Monday, Wednesday, and Friday. Start taking on:  05/24/2018 What changed:  when to take this   FISH OIL PO Take 1 capsule by mouth 2 (two) times daily.   FLUoxetine 20 MG capsule Commonly known as:  PROZAC Take 60 mg by mouth daily.   gabapentin 300 MG capsule Commonly known as:  NEURONTIN Take 1 capsule (300 mg total) by mouth 3 (three) times daily.   Glucosamine HCl 1000 MG Tabs Take 2,000 mg by mouth at bedtime.   lisinopril 20 MG tablet Commonly known as:  PRINIVIL,ZESTRIL Take 20 mg by mouth daily.   mirtazapine 45 MG tablet Commonly known as:  REMERON Take 45 mg by mouth at bedtime.   Oxycodone HCl 10 MG Tabs Take 10 mg by mouth 3 (three) times daily as needed (for pain).   pantoprazole 40 MG tablet Commonly known as:  PROTONIX Take  1 tablet (40 mg total) by mouth 2 (two) times daily before a meal.   polyethylene glycol packet Commonly known as:  MIRALAX / GLYCOLAX Take 17 g by mouth daily.   senna-docusate 8.6-50 MG tablet Commonly known as:  Senokot-S Take 1 tablet by mouth at bedtime.   tamsulosin 0.4 MG Caps capsule Commonly known as:  FLOMAX Take 1 capsule (0.4 mg total) by mouth daily.   THERATEARS OP Place 1 drop into both eyes 4 (four) times daily as needed (for dry eyes).      Allergies  Allergen Reactions  . Penicillins Other (See Comments)    Reaction:  Unknown  Has patient had a PCN reaction causing immediate rash, facial/tongue/throat swelling, SOB or lightheadedness with hypotension: Unknown Has patient had a PCN reaction causing severe rash involving mucus membranes or skin necrosis: Unknown Has patient had a PCN reaction that required hospitalization: Unknown Has patient had a PCN reaction occurring within the last 10 years: No If all of the above answers are "NO", then may proceed with Cephalosporin use.   Follow-up Information    [facility] in 1 week(s).   Why:  hospital discharge follow up, repeat cbc/bmp at  follow up. Contact information: [place] [address] [phone]        follow up with your urologist Follow up.   Why:  Right sided hydrocele.       follow up with your spine surgeon Follow up.   Why:  severe residual facet hypertrophy at L1-L2 through L3-L4  Moderate spinal stenosis at L1-L2 is increased from the preoperative MRI in 2015           The results of significant diagnostics from this hospitalization (including imaging, microbiology, ancillary and laboratory) are listed below for reference.    Significant Diagnostic Studies: Ct Head Wo Contrast  Result Date: 05/21/2018 CLINICAL DATA:  72 year old male with altered mental status, encephalopathy. Agitated. EXAM: CT HEAD WITHOUT CONTRAST TECHNIQUE: Contiguous axial images were obtained from the base of the skull through the vertex without intravenous contrast. COMPARISON:  Head CT 12/24/2016, brain MRI 12/06/2016. FINDINGS: Brain: Chronic right cerebellar infarct. Chronic heterogeneous hypodensity in the bilateral deep gray matter nuclei. Patchy cerebral white matter hypodensity bilaterally appear stable. Stable cerebral volume. No midline shift, ventriculomegaly, mass effect, evidence of mass lesion, intracranial hemorrhage or evidence of cortically based acute infarction. Vascular: Calcified atherosclerosis at the skull base. No suspicious intracranial vascular hyperdensity. Skull: Negative. Sinuses/Orbits: Visualized paranasal sinuses and mastoids are stable and well pneumatized. Other: Stable, negative orbit and scalp soft tissues. IMPRESSION: Stable chronic ischemic disease.  No acute intracranial abnormality. Electronically Signed   By: Genevie Ann M.D.   On: 05/21/2018 14:27   Mr Lumbar Spine Wo Contrast  Result Date: 05/22/2018 CLINICAL DATA:  72 year old male with back pain, urinary retention, gait instability, lower leg weakness. History of nephrolithiasis and prostate cancer.  Prior lumbar spine surgery EXAM: MRI LUMBAR SPINE WITHOUT CONTRAST TECHNIQUE: Multiplanar, multisequence MR imaging of the lumbar spine was performed. No intravenous contrast was administered. COMPARISON:  CT Abdomen and Pelvis 05/20/2018, 12/05/2017, 01/09/2017. Lumbar MRI 05/25/2014. Chest abdomen and pelvis CT 12/25/2016. FINDINGS: Segmentation: Transitional anatomy with 12 pairs of full size ribs and sacralized L5 level on the 2018 chest abdomen and pelvis study. Vestigial L5-S1 disc space. Correlation with radiographs is recommended prior to any operative intervention. Alignment: Stable since 2015. Mild grade 1 anterolisthesis of L3 on L4 with relatively preserved lumbar  lordosis. Vertebrae: Postoperative changes to the posterior elements L1-L2 through L3-L4, details below. No marrow edema or evidence of acute osseous abnormality. Background bone marrow signal is normal. No evidence of osseous metastatic disease. Intact visible sacrum and SI joints. Conus medullaris and cauda equina: Conus extends to the T12 level. No lower spinal cord or conus signal abnormality. Paraspinal and other soft tissues: Postoperative changes to the posterior paraspinal soft tissues with no postoperative fluid collection. Cholelithiasis suspected on series 6, image 18. Trace pelvic free fluid is visible on series 4, image 7 but nonspecific. Otherwise stable visible abdominal viscera from the recent CT Abdomen and Pelvis. Disc levels: T9-T10: Spinal stenosis with mild to moderate spinal cord mass effect (series 6, image 3) related to broad-based posterior disc bulge or protrusion plus moderate to severe facet and ligament flavum hypertrophy. There is moderate to severe left greater than right T9 foraminal stenosis. No definite spinal cord signal abnormality. T10-T11: Mild spinal stenosis related to moderate to severe posterior element hypertrophy. Mild if any cord mass effect. Moderate to severe left T10 foraminal stenosis. T11-T12:  Mild facet hypertrophy. T12-L1:  Mild to moderate facet hypertrophy. L1-L2: Previous midline laminectomy. Progressed and severe residual facet hypertrophy with degenerative bilateral facet joint fluid. Circumferential disc bulge with broad-based posterior component appears mildly increased since 2015. Increased and moderate spinal stenosis since the preoperative MRI (series 6, image 27). Mild to moderate L1 foraminal stenosis greater on the right appears increased. L2-L3: Previous midline laminectomy with severe residual facet hypertrophy. Bilateral facet joint fluid and an associated right side 8 millimeter synovial cyst (series 6, image 33). Mild circumferential disc bulge with broad-based posterior component. Chronic spinal stenosis here now appears mildly improved from the preoperative MRI although there is severe right lateral recess stenosis in part related to the right synovial cyst (right L3 nerve level). Mild to moderate L2 foraminal stenosis appears improved. L3-L4: Mild anterolisthesis. Previous midline laminectomy with severe residual facet hypertrophy. Circumferential disc bulge with new or increased right foraminal disc extrusion since 2015 (series 3, image 3). No significant spinal or lateral recess stenosis but there is moderate to severe right L3 foraminal stenosis. L4-L5: Severe facet hypertrophy is stable. Mild disc bulging. No spinal or lateral recess stenosis. Mild to moderate left and mild right L4 foraminal stenosis is stable. L5-S1:  Sacralized and negative. IMPRESSION: 1. Transitional anatomy with sacralized L5-S1 level. Correlation with radiographs is recommended prior to any operative intervention. 2. No metastatic disease identified in the noncontrast lumbar spine. There is nonspecific trace pelvic free fluid. 3. Previous midline laminectomy L1-L2 through L3-L4 although severe residual facet hypertrophy at each level. Moderate spinal stenosis at L1-L2 is increased from the preoperative  MRI in 2015. L2-L3 spinal stenosis is mildly improved but there is an 8 millimeter right lateral recess synovial cyst with severe right L3 nerve level stenosis. No L3-L4 spinal stenosis but new or increased right foraminal disc extrusion also moderately to severely affecting the right L3 nerve. 4. Lower thoracic spinal stenosis at T9-T10 due to combined disc and posterior element degeneration with mild to moderate spinal cord mass effect, no definite cord signal abnormality. Moderate lower thoracic foraminal stenosis. 5. Cholelithiasis. Electronically Signed   By: Genevie Ann M.D.   On: 05/22/2018 15:05   Dg Chest Port 1 View  Result Date: 05/21/2018 CLINICAL DATA:  Hypoxia. EXAM: PORTABLE CHEST 1 VIEW COMPARISON:  Chest CT dated 12/25/2016 and portable chest dated 12/25/2016. FINDINGS: Poor inspiration. Clear lungs. Grossly normal sized heart.  Mildly tortuous aorta. Mild peribronchial thickening. Unremarkable bones. IMPRESSION: Mild bronchitic changes. Electronically Signed   By: Claudie Revering M.D.   On: 05/21/2018 10:45   Ct Renal Stone Study  Result Date: 05/20/2018 CLINICAL DATA:  Pelvic pain and dysuria for the past 2 days. History of nephrolithiasis and prostate cancer. EXAM: CT ABDOMEN AND PELVIS WITHOUT CONTRAST TECHNIQUE: Multidetector CT imaging of the abdomen and pelvis was performed following the standard protocol without IV contrast. COMPARISON:  12/05/2017 and 12/25/2016. FINDINGS: Lower chest: Stable minimal scarring at the lung bases. Hepatobiliary: No focal liver abnormality is seen. No gallstones, gallbladder wall thickening, or biliary dilatation. Pancreas: Unremarkable. No pancreatic ductal dilatation or surrounding inflammatory changes. Spleen: Normal in size without focal abnormality. Adrenals/Urinary Tract: Stable small left adrenal mass measuring 1.4 x 1.3 cm and 20 Hounsfield units in density on image number 21 series 2. Normal appearing right adrenal gland, kidneys and ureters. Foley  catheter in the urinary bladder with an associated small amount of air in the bladder and no significant urine. Stomach/Bowel: Multiple colonic diverticula without evidence of diverticulitis. These are scattered throughout the colon. Normal appearing appendix, small bowel and stomach. Vascular/Lymphatic: Atheromatous arterial calcifications without aneurysm. No enlarged lymph nodes. Reproductive: Surgically absent prostate gland with associated surgical clips. Other: Small bilateral inguinal hernias containing fat. Partially included right hydrocele. Musculoskeletal: Lumbar and lower thoracic spine degenerative changes. No evidence of bony metastatic disease. IMPRESSION: 1. No acute abnormality. 2. Stable probable small left adrenal adenoma. 3. Colonic diverticulosis. 4. Right hydrocele. Electronically Signed   By: Claudie Revering M.D.   On: 05/20/2018 16:14    Microbiology: Recent Results (from the past 240 hour(s))  Urine culture     Status: None   Collection Time: 05/20/18  1:36 PM  Result Value Ref Range Status   Specimen Description   Final    URINE, CATHETERIZED Performed at [facility], [address] [place], [place] [address]    Special Requests   Final    NONE Performed at [facility], [address] [place], [place] [address]    Culture   Final    NO GROWTH Performed at [facility], [place] [address] [place], [place] [address]    Report Status 05/22/2018 FINAL  Final  Culture, blood (routine x 2)     Status: None (Preliminary result)   Collection Time: 05/20/18  3:00 PM  Result Value Ref Range Status   Specimen Description   Final    BLOOD RIGHT HAND Performed at [facility], [address] [place], [place] [address]    Special Requests   Final    BOTTLES DRAWN AEROBIC AND ANAEROBIC Blood Culture adequate volume Performed at [facility], [address] [place], [place] [address]    Culture   Final    NO GROWTH 2  DAYS Performed at [facility], [place] [address] [place], [place] [address]    Report Status PENDING  Incomplete  Culture, blood (routine x 2)     Status: None (Preliminary result)   Collection Time: 05/20/18  3:07 PM  Result Value Ref Range Status   Specimen Description   Final    BLOOD RIGHT ARM Performed at [facility], [address] [place], [place] [address]    Special Requests   Final    BOTTLES DRAWN AEROBIC AND ANAEROBIC Blood Culture adequate volume Performed at [facility]  [address], [address], [place], [place] [address]    Culture   Final    NO GROWTH 2 DAYS Performed at [facility], [place] [address] [place], [place] [address]    Report Status PENDING  Incomplete  MRSA PCR Screening     Status: None   Collection Time: 05/21/18  2:08 AM  Result Value Ref Range Status   MRSA by PCR NEGATIVE NEGATIVE Final    Comment:        The GeneXpert MRSA Assay (FDA approved for NASAL specimens only), is one component of a comprehensive MRSA colonization surveillance program. It is not intended to diagnose MRSA infection nor to guide or monitor treatment for MRSA infections. Performed at [facility], [place] [address], [place], [place] [address]      Labs: Basic Metabolic Panel: Recent Labs  Lab 05/20/18 1421 05/21/18 0038 05/21/18 0556 05/22/18 0617 05/23/18 0553  NA 139 142 143 144 144  K 3.9 4.2 4.0 3.6 3.6  CL 101 107 109 110 112*  CO2 26 26 25 24 23   GLUCOSE 119* 102* 98 82 94  BUN 24* 27* 28* 15 10  CREATININE 3.05* 2.86* 2.59* 1.35* 1.20  CALCIUM 8.7* 8.1* 8.0* 8.6* 8.5*  MG  --   --   --  1.9 1.7   Liver Function Tests: Recent Labs  Lab 05/20/18 1421 05/21/18 0556  AST 36 36  ALT 32 29  ALKPHOS 126 131*  BILITOT 0.4 0.8  PROT 6.8 5.9*  ALBUMIN 3.8 3.6   No results for input(s): LIPASE, AMYLASE in the last 168 hours. No results for input(s): AMMONIA in the last 168 hours. CBC: Recent Labs  Lab  05/20/18 1421 05/21/18 0038 05/21/18 0556 05/22/18 0617 05/23/18 0553  WBC 10.9* 12.7* 10.5 8.4 7.5  NEUTROABS 8.2*  --   --  6.2 5.9  HGB 10.6* 9.6* 9.1* 10.2* 9.2*  HCT 34.1* 33.0* 31.3* 33.6* 30.2*  MCV 98.8 104.1* 104.0* 100.3* 98.4  PLT 266 237 212 197 215   Cardiac Enzymes: No results for input(s): CKTOTAL, CKMB, CKMBINDEX, TROPONINI in the last 168 hours. BNP: BNP (last 3 results) No results for input(s): BNP in the last 8760 hours.  ProBNP (last 3 results) No results for input(s): PROBNP in the last 8760 hours.  CBG: No results for input(s): GLUCAP in the last 168 hours.     Signed:  Florencia Reasons MD, PhD  [facility] 05/23/2018, 12:56 PM

## 2018-05-25 LAB — CULTURE, BLOOD (ROUTINE X 2)
CULTURE: NO GROWTH
Culture: NO GROWTH
SPECIAL REQUESTS: ADEQUATE
SPECIAL REQUESTS: ADEQUATE

## 2018-07-12 ENCOUNTER — Other Ambulatory Visit: Payer: Self-pay | Admitting: *Deleted

## 2018-07-12 DIAGNOSIS — R911 Solitary pulmonary nodule: Secondary | ICD-10-CM

## 2018-08-25 ENCOUNTER — Other Ambulatory Visit: Payer: Non-veteran care

## 2018-08-29 ENCOUNTER — Ambulatory Visit: Payer: Non-veteran care | Admitting: Thoracic Surgery (Cardiothoracic Vascular Surgery)

## 2021-04-08 ENCOUNTER — Other Ambulatory Visit: Payer: Self-pay

## 2021-04-08 ENCOUNTER — Emergency Department (HOSPITAL_COMMUNITY): Payer: No Typology Code available for payment source

## 2021-04-08 ENCOUNTER — Encounter (HOSPITAL_COMMUNITY): Payer: Self-pay | Admitting: Emergency Medicine

## 2021-04-08 ENCOUNTER — Inpatient Hospital Stay (HOSPITAL_COMMUNITY)
Admission: EM | Admit: 2021-04-08 | Discharge: 2021-04-11 | DRG: 377 | Discharge status: Against Medical Advice | Payer: No Typology Code available for payment source | Attending: Family Medicine | Admitting: Family Medicine

## 2021-04-08 DIAGNOSIS — K5521 Angiodysplasia of colon with hemorrhage: Secondary | ICD-10-CM | POA: Diagnosis not present

## 2021-04-08 DIAGNOSIS — E43 Unspecified severe protein-calorie malnutrition: Secondary | ICD-10-CM | POA: Diagnosis present

## 2021-04-08 DIAGNOSIS — N1831 Chronic kidney disease, stage 3a: Secondary | ICD-10-CM | POA: Diagnosis present

## 2021-04-08 DIAGNOSIS — I10 Essential (primary) hypertension: Secondary | ICD-10-CM | POA: Diagnosis not present

## 2021-04-08 DIAGNOSIS — Z8673 Personal history of transient ischemic attack (TIA), and cerebral infarction without residual deficits: Secondary | ICD-10-CM

## 2021-04-08 DIAGNOSIS — Z9079 Acquired absence of other genital organ(s): Secondary | ICD-10-CM

## 2021-04-08 DIAGNOSIS — Z8249 Family history of ischemic heart disease and other diseases of the circulatory system: Secondary | ICD-10-CM

## 2021-04-08 DIAGNOSIS — F319 Bipolar disorder, unspecified: Secondary | ICD-10-CM | POA: Diagnosis not present

## 2021-04-08 DIAGNOSIS — Z7982 Long term (current) use of aspirin: Secondary | ICD-10-CM

## 2021-04-08 DIAGNOSIS — Z800 Family history of malignant neoplasm of digestive organs: Secondary | ICD-10-CM

## 2021-04-08 DIAGNOSIS — Z880 Allergy status to penicillin: Secondary | ICD-10-CM

## 2021-04-08 DIAGNOSIS — E782 Mixed hyperlipidemia: Secondary | ICD-10-CM

## 2021-04-08 DIAGNOSIS — Z79899 Other long term (current) drug therapy: Secondary | ICD-10-CM

## 2021-04-08 DIAGNOSIS — Z823 Family history of stroke: Secondary | ICD-10-CM

## 2021-04-08 DIAGNOSIS — Z888 Allergy status to other drugs, medicaments and biological substances status: Secondary | ICD-10-CM

## 2021-04-08 DIAGNOSIS — Z6824 Body mass index (BMI) 24.0-24.9, adult: Secondary | ICD-10-CM

## 2021-04-08 DIAGNOSIS — Z8546 Personal history of malignant neoplasm of prostate: Secondary | ICD-10-CM

## 2021-04-08 DIAGNOSIS — Z20822 Contact with and (suspected) exposure to covid-19: Secondary | ICD-10-CM | POA: Diagnosis present

## 2021-04-08 DIAGNOSIS — D649 Anemia, unspecified: Secondary | ICD-10-CM

## 2021-04-08 DIAGNOSIS — D500 Iron deficiency anemia secondary to blood loss (chronic): Secondary | ICD-10-CM | POA: Diagnosis present

## 2021-04-08 DIAGNOSIS — K31811 Angiodysplasia of stomach and duodenum with bleeding: Secondary | ICD-10-CM | POA: Diagnosis present

## 2021-04-08 DIAGNOSIS — K219 Gastro-esophageal reflux disease without esophagitis: Secondary | ICD-10-CM | POA: Diagnosis present

## 2021-04-08 DIAGNOSIS — I129 Hypertensive chronic kidney disease with stage 1 through stage 4 chronic kidney disease, or unspecified chronic kidney disease: Secondary | ICD-10-CM | POA: Diagnosis present

## 2021-04-08 DIAGNOSIS — R634 Abnormal weight loss: Secondary | ICD-10-CM | POA: Diagnosis present

## 2021-04-08 DIAGNOSIS — Z87891 Personal history of nicotine dependence: Secondary | ICD-10-CM

## 2021-04-08 LAB — CBC WITH DIFFERENTIAL/PLATELET
Abs Immature Granulocytes: 0.3 10*3/uL — ABNORMAL HIGH (ref 0.00–0.07)
Basophils Absolute: 0.1 10*3/uL (ref 0.0–0.1)
Basophils Relative: 1 %
Eosinophils Absolute: 0.3 10*3/uL (ref 0.0–0.5)
Eosinophils Relative: 2 %
HCT: 23.1 % — ABNORMAL LOW (ref 39.0–52.0)
Hemoglobin: 6.7 g/dL — CL (ref 13.0–17.0)
Immature Granulocytes: 2 %
Lymphocytes Relative: 14 %
Lymphs Abs: 1.9 10*3/uL (ref 0.7–4.0)
MCH: 24.9 pg — ABNORMAL LOW (ref 26.0–34.0)
MCHC: 29 g/dL — ABNORMAL LOW (ref 30.0–36.0)
MCV: 85.9 fL (ref 80.0–100.0)
Monocytes Absolute: 1 10*3/uL (ref 0.1–1.0)
Monocytes Relative: 7 %
Neutro Abs: 10.1 10*3/uL — ABNORMAL HIGH (ref 1.7–7.7)
Neutrophils Relative %: 74 %
Platelets: 575 10*3/uL — ABNORMAL HIGH (ref 150–400)
RBC: 2.69 MIL/uL — ABNORMAL LOW (ref 4.22–5.81)
RDW: 16.6 % — ABNORMAL HIGH (ref 11.5–15.5)
WBC: 13.7 10*3/uL — ABNORMAL HIGH (ref 4.0–10.5)
nRBC: 0.2 % (ref 0.0–0.2)

## 2021-04-08 LAB — COMPREHENSIVE METABOLIC PANEL
ALT: 20 U/L (ref 0–44)
AST: 26 U/L (ref 15–41)
Albumin: 3.3 g/dL — ABNORMAL LOW (ref 3.5–5.0)
Alkaline Phosphatase: 155 U/L — ABNORMAL HIGH (ref 38–126)
Anion gap: 11 (ref 5–15)
BUN: 26 mg/dL — ABNORMAL HIGH (ref 8–23)
CO2: 27 mmol/L (ref 22–32)
Calcium: 9.4 mg/dL (ref 8.9–10.3)
Chloride: 102 mmol/L (ref 98–111)
Creatinine, Ser: 1.36 mg/dL — ABNORMAL HIGH (ref 0.61–1.24)
GFR, Estimated: 55 mL/min — ABNORMAL LOW (ref >60)
Glucose, Bld: 92 mg/dL (ref 70–99)
Potassium: 3.7 mmol/L (ref 3.5–5.1)
Sodium: 140 mmol/L (ref 135–145)
Total Bilirubin: 0.3 mg/dL (ref 0.3–1.2)
Total Protein: 7.1 g/dL (ref 6.5–8.1)

## 2021-04-08 LAB — RESP PANEL BY RT-PCR (FLU A&B, COVID) ARPGX2
Influenza A by PCR: NEGATIVE
Influenza B by PCR: NEGATIVE
SARS Coronavirus 2 by RT PCR: NEGATIVE

## 2021-04-08 LAB — IRON AND TIBC
Iron: 23 ug/dL — ABNORMAL LOW (ref 45–182)
Saturation Ratios: 5 % — ABNORMAL LOW (ref 17.9–39.5)
TIBC: 486 ug/dL — ABNORMAL HIGH (ref 250–450)
UIBC: 463 ug/dL

## 2021-04-08 LAB — PREPARE RBC (CROSSMATCH)

## 2021-04-08 LAB — RETICULOCYTES
Immature Retic Fract: 29.6 % — ABNORMAL HIGH (ref 2.3–15.9)
RBC.: 2.74 MIL/uL — ABNORMAL LOW (ref 4.22–5.81)
Retic Count, Absolute: 93.7 10*3/uL (ref 19.0–186.0)
Retic Ct Pct: 3.4 % — ABNORMAL HIGH (ref 0.4–3.1)

## 2021-04-08 LAB — VITAMIN B12: Vitamin B-12: 308 pg/mL (ref 180–914)

## 2021-04-08 LAB — PROTIME-INR
INR: 1 (ref 0.8–1.2)
Prothrombin Time: 12.8 seconds (ref 11.4–15.2)

## 2021-04-08 LAB — FERRITIN: Ferritin: 11 ng/mL — ABNORMAL LOW (ref 24–336)

## 2021-04-08 LAB — FOLATE: Folate: 11.8 ng/mL (ref >5.9)

## 2021-04-08 LAB — POC OCCULT BLOOD, ED: Fecal Occult Bld: NEGATIVE

## 2021-04-08 MED ORDER — LURASIDONE HCL 20 MG PO TABS
20.0000 mg | ORAL_TABLET | Freq: Every day | ORAL | Status: DC
  Administered 2021-04-09 – 2021-04-10 (×2): 20 mg via ORAL
  Filled 2021-04-08 (×3): qty 1

## 2021-04-08 MED ORDER — GABAPENTIN 300 MG PO CAPS
600.0000 mg | ORAL_CAPSULE | Freq: Every day | ORAL | Status: DC
  Administered 2021-04-08 – 2021-04-10 (×3): 600 mg via ORAL
  Filled 2021-04-08 (×3): qty 2

## 2021-04-08 MED ORDER — FLUTICASONE PROPIONATE 50 MCG/ACT NA SUSP
1.0000 | Freq: Every day | NASAL | Status: DC | PRN
  Filled 2021-04-08: qty 16

## 2021-04-08 MED ORDER — SODIUM CHLORIDE 0.9 % IV SOLN
10.0000 mL/h | Freq: Once | INTRAVENOUS | Status: AC
  Administered 2021-04-08: 10 mL/h via INTRAVENOUS

## 2021-04-08 MED ORDER — FLUOXETINE HCL 20 MG PO CAPS
20.0000 mg | ORAL_CAPSULE | Freq: Every day | ORAL | Status: DC
  Administered 2021-04-09 – 2021-04-10 (×2): 20 mg via ORAL
  Filled 2021-04-08 (×2): qty 1

## 2021-04-08 MED ORDER — PANTOPRAZOLE 80MG IVPB - SIMPLE MED
80.0000 mg | Freq: Once | INTRAVENOUS | Status: AC
  Administered 2021-04-09: 80 mg via INTRAVENOUS
  Filled 2021-04-08: qty 80

## 2021-04-08 MED ORDER — ATORVASTATIN CALCIUM 40 MG PO TABS
40.0000 mg | ORAL_TABLET | Freq: Every day | ORAL | Status: DC
  Administered 2021-04-08 – 2021-04-10 (×3): 40 mg via ORAL
  Filled 2021-04-08 (×3): qty 1

## 2021-04-08 MED ORDER — ACETAMINOPHEN 325 MG PO TABS
650.0000 mg | ORAL_TABLET | Freq: Four times a day (QID) | ORAL | Status: DC | PRN
  Administered 2021-04-08 – 2021-04-11 (×2): 650 mg via ORAL
  Filled 2021-04-08 (×2): qty 2

## 2021-04-08 MED ORDER — ONDANSETRON HCL 4 MG PO TABS: 4.0000 mg | ORAL_TABLET | Freq: Four times a day (QID) | ORAL | Status: DC | PRN

## 2021-04-08 MED ORDER — METHOCARBAMOL 500 MG PO TABS
750.0000 mg | ORAL_TABLET | Freq: Three times a day (TID) | ORAL | Status: DC | PRN
  Administered 2021-04-09: 750 mg via ORAL
  Filled 2021-04-08: qty 2

## 2021-04-08 MED ORDER — PANTOPRAZOLE SODIUM 40 MG IV SOLR
40.0000 mg | Freq: Two times a day (BID) | INTRAVENOUS | Status: DC
  Administered 2021-04-09: 40 mg via INTRAVENOUS
  Filled 2021-04-08 (×2): qty 40

## 2021-04-08 MED ORDER — ONDANSETRON HCL 4 MG/2ML IJ SOLN: 4.0000 mg | Freq: Four times a day (QID) | INTRAMUSCULAR | Status: DC | PRN

## 2021-04-08 MED ORDER — ACETAMINOPHEN 650 MG RE SUPP: 650.0000 mg | Freq: Four times a day (QID) | RECTAL | Status: DC | PRN

## 2021-04-08 MED ORDER — AMLODIPINE BESYLATE 10 MG PO TABS
10.0000 mg | ORAL_TABLET | Freq: Every day | ORAL | Status: DC
  Administered 2021-04-09 – 2021-04-10 (×2): 10 mg via ORAL
  Filled 2021-04-08: qty 2
  Filled 2021-04-08: qty 1

## 2021-04-08 MED ORDER — MIRTAZAPINE 15 MG PO TABS
45.0000 mg | ORAL_TABLET | Freq: Every day | ORAL | Status: DC
  Administered 2021-04-08 – 2021-04-10 (×3): 45 mg via ORAL
  Filled 2021-04-08 (×4): qty 3

## 2021-04-08 NOTE — Assessment & Plan Note (Signed)
   Continue home regimen of statin therapy

## 2021-04-08 NOTE — Assessment & Plan Note (Signed)
Strict intake and output monitoring Creatinine near baseline Minimizing nephrotoxic agents as much as possible Serial chemistries to monitor renal function and electrolytes  

## 2021-04-08 NOTE — ED Provider Notes (Signed)
[facility] EMERGENCY DEPARTMENT Provider Note   CSN: [id number] Arrival date & time: 04/08/21  1447     History Chief Complaint  Patient presents with   Abnormal Lab    Jason [name] is a 75 y.o. male.  HPI Jason [name] is a 75 yo male with PMHx anemia, Gi bleed, CVA, HLD presenting for fatigue. He reports fatigue for several years, though he has noticed worsening in the past few weeks. [facility] to [facility] emergency department for Hgb of 6. He endorses shortness of breath on exertion, and reports that he is unable to ambulate more than 20 feet and stand for 2 min at a time before feeling fatigued. He reports a 50 lb weight loss in the past 2 months and slight cough, which he attributes to vaping. No changes in cough. He denies any chest pain, fevers, night sweats, abd pain, or blood in stool. No hx of blood transfusions, though has received iron transfusion in the past. Wife at bedside helps to corroborate history.  LBGI armbuster is gastroenterologist -but denies any melena or hematochezia.     Past Medical History:  Diagnosis Date   Acid reflux disease with ulcer    AKI (acute kidney injury) ([place])    Anemia    Arthritis    Bipolar 1 disorder ([place])    Blood transfusion without reported diagnosis    Carotid stenosis    Cataract    Chronic back pain    Complication of anesthesia 2000   reaction to anesthesia had some swelling    Depression    GERD (gastroesophageal reflux disease)    Hearing loss    Hyperlipidemia    Hypertension    Prostate cancer ([place])    prostate cancer   PTSD (post-traumatic stress disorder)    Stroke ([place]) 08/2015   Substance abuse ([place])    TIA (transient ischemic attack)    has had several mini strokes    Patient Active Problem List   Diagnosis Date Noted   Acute respiratory failure with hypoxia and hypercarbia ([place]) 05/21/2018   Altered mental status 05/21/2018   Acute renal failure  superimposed on stage 3 chronic kidney disease ([place]) 05/20/2018   CKD (chronic kidney disease) stage 4, GFR 15-29 ml/min ([place]) 04/16/2018   DOE (dyspnea on exertion) 01/17/2018   Solitary pulmonary nodule on lung CT 01/17/2018   Adrenal incidentaloma (H[facility])    Ureteral calculus    Sepsis ([place]) 12/24/2016   Acute blood loss anemia    Melena    Anemia 10/14/2015   Hyperglycemia 10/14/2015   Acid reflux disease with ulcer    Bleeding gastrointestinal    Symptomatic anemia    Upper GI bleeding    Carotid artery stenosis    HLD (hyperlipidemia) 08/31/2015   CVA (cerebral infarction) 08/30/2015   Degeneration of lumbar or lumbosacral intervertebral disc 05/16/2014   Weakness 05/03/2014   Dysarthria 04/30/2014   AKI (acute kidney injury) ([place]) 04/30/2014   Hypotension 04/30/2014   Fall 04/30/2014   CVA (cerebral vascular accident) ([place]) 04/13/2014   Stroke ([place]) 04/13/2014   CAP (community acquired pneumonia) 02/13/2013   Musculoskeletal pain 02/13/2013   Hx of completed stroke 02/13/2013   Bipolar 1 disorder ([place]) 02/13/2013   Depression 02/13/2013   Essential hypertension 02/13/2013   H/O prostate cancer 02/13/2013   Nausea with vomiting 02/13/2013   GERD (gastroesophageal reflux disease) 02/13/2013   Poor balance 02/13/2013    Past  Surgical History:  Procedure Laterality Date   BACK SURGERY     CARDIAC CATHETERIZATION     cant remember when   COLONOSCOPY N/A 10/16/2015   Procedure: COLONOSCOPY;  Surgeon: [name], MD;  Location: [place];  Service: Gastroenterology;  Laterality: N/A;   COLONOSCOPY     ESOPHAGOGASTRODUODENOSCOPY N/A 10/15/2015   Procedure: ESOPHAGOGASTRODUODENOSCOPY (EGD);  Surgeon: [name], MD;  Location: [place];  Service: Gastroenterology;  Laterality: N/A;   EYE SURGERY     cataracts   GIVENS CAPSULE STUDY N/A 10/16/2015   Procedure: GIVENS CAPSULE STUDY;  Surgeon: [name], MD;  Location: [place];   Service: Gastroenterology;  Laterality: N/A;   POLYPECTOMY     PROSTATE SURGERY     RADIOLOGY WITH ANESTHESIA N/A 05/22/2018   Procedure: MRI WITH ANESTHESIA/LUMBAR SPINE;  Surgeon: Radiologist, Medication, MD;  Location: [place];  Service: Radiology;  Laterality: N/A;       Family History  Problem Relation Age of Onset   Stroke Mother        Hemorrhagic stroke   Cancer Father        Pancreatic cancer   Hypertension Father    Pancreatic cancer Father    Colon cancer Neg Hx    Esophageal cancer Neg Hx    Liver cancer Neg Hx    Rectal cancer Neg Hx    Stomach cancer Neg Hx     Social History   Tobacco Use   Smoking status: Former    Packs/day: 1.50    Years: 30.00    Pack years: 45.00    Types: Cigarettes    Quit date: 03/06/2003    Years since quitting: 18.1   Smokeless tobacco: Never  Vaping Use   Vaping Use: Every day  Substance Use Topics   Alcohol use: No    Alcohol/week: 0.0 standard drinks    Comment: drank years ago   Drug use: No    Home Medications Prior to Admission medications   Medication Sig Start Date End Date Taking? Authorizing Provider  acetaminophen (TYLENOL) 500 MG tablet Take 1,000 mg by mouth every 6 (six) hours as needed. 07/10/20  Yes [provider]  amLODipine (NORVASC) 10 MG tablet Take 10 mg by mouth daily.   Yes [provider]  Ascorbic Acid (VITAMIN C) 1000 MG tablet Take 1,000 mg by mouth daily.   Yes [provider]  aspirin EC 81 MG tablet Take 81 mg by mouth daily.   Yes [provider]  atorvastatin (LIPITOR) 40 MG tablet Take 1 tablet (40 mg total) by mouth daily at 6 PM. Patient taking differently: Take 40 mg by mouth at bedtime. 04/14/14  Yes Mikhail, Velta Addison, DO  Carboxymethylcellulose Sodium (THERATEARS OP) Place 1 drop into both eyes 4 (four) times daily as needed (for dry eyes).    Yes [provider]  ferrous sulfate 325 (65 FE) MG tablet Take 1 tablet (325 mg total) by mouth every  Monday, Wednesday, and Friday. 05/24/18  Yes Florencia Reasons, MD  FLUoxetine (PROZAC) 20 MG capsule Take 20 mg by mouth daily.   Yes [provider]  fluticasone (FLONASE) 50 MCG/ACT nasal spray Place 1 spray into both nostrils daily as needed for allergies or rhinitis.   Yes [provider]  gabapentin (NEURONTIN) 300 MG capsule Take 1 capsule (300 mg total) by mouth 3 (three) times daily. Patient taking differently: Take 600 mg by mouth at bedtime. 01/12/17  Yes Barton Dubois, MD  hydrochlorothiazide (HYDRODIURIL) 25 MG tablet Take 12.5 tablets by mouth daily. 02/24/21 04/08/21 Yes [provider]  lurasidone (LATUDA) 20 MG TABS tablet Take 20 mg by mouth daily. 03/24/21  Yes [provider]  methocarbamol (ROBAXIN) 750 MG tablet Take 750 mg by mouth every 8 (eight) hours as needed for muscle spasms. 05/07/20  Yes [provider]  mirtazapine (REMERON) 45 MG tablet Take 45 mg by mouth at bedtime.   Yes [provider]  naproxen sodium (ALEVE) 220 MG tablet Take 440 mg by mouth daily as needed (pain).   Yes [provider]  pantoprazole (PROTONIX) 40 MG tablet Take 1 tablet (40 mg total) by mouth 2 (two) times daily before a meal. Patient taking differently: Take 40 mg by mouth daily as needed (heartburn). 12/28/16  Yes Rai, Ripudeep K, MD  polyethylene glycol (MIRALAX / GLYCOLAX) packet Take 17 g by mouth daily. Patient not taking: No sig reported 05/23/18   Florencia Reasons, MD  senna-docusate (SENOKOT-S) 8.6-50 MG tablet Take 1 tablet by mouth at bedtime. Patient not taking: No sig reported 05/23/18   Florencia Reasons, MD  tamsulosin (FLOMAX) 0.4 MG CAPS capsule Take 1 capsule (0.4 mg total) by mouth daily. Patient not taking: No sig reported 05/23/18   Florencia Reasons, MD  vitamin B-12 1000 MCG tablet Take 1 tablet (1,000 mcg total) by mouth daily. Patient not taking: No sig reported 05/23/18   Florencia Reasons, MD    Allergies    Lisinopril and Penicillins  Review  of Systems   Review of Systems  Constitutional:  Positive for fatigue and unexpected weight change. Negative for chills and fever.  HENT:  Negative for congestion.   Eyes:  Negative for pain.  Respiratory:  Negative for cough and shortness of breath.   Cardiovascular:  Negative for chest pain and leg swelling.  Gastrointestinal:  Negative for abdominal pain, diarrhea, nausea and vomiting.  Genitourinary:  Negative for dysuria.  Musculoskeletal:  Negative for myalgias.  Skin:  Negative for rash.  Neurological:  Positive for light-headedness. Negative for dizziness and headaches.   Physical Exam Updated Vital Signs BP (!) 131/53   Pulse 96   Temp 98 F (36.7 C) (Oral)   Resp (!) 25   SpO2 98%   Physical Exam Vitals and nursing note reviewed.  Constitutional:      General: He is not in acute distress.    Comments: Pleasant, fatigued appearing  HENT:     Head: Normocephalic and atraumatic.     Nose: Nose normal.     Mouth/Throat:     Mouth: Mucous membranes are moist.  Eyes:     General: No scleral icterus.    Comments: Pale conjunctiva   Cardiovascular:     Rate and Rhythm: Normal rate and regular rhythm.     Pulses: Normal pulses.     Heart sounds: Normal heart sounds.  Pulmonary:     Effort: Pulmonary effort is normal. No respiratory distress.     Breath sounds: No wheezing.  Abdominal:     Palpations: Abdomen is soft.     Tenderness: There is no abdominal tenderness. There is no guarding or rebound.  Genitourinary:    Comments: GU exam without any internal or external hemorrhoids. Soft brown stool Musculoskeletal:     Cervical back: Normal range of motion.     Right lower leg: No edema.     Left lower leg: No edema.  Skin:    General: Skin is warm and  dry.     Capillary Refill: Capillary refill takes less than 2 seconds.  Neurological:     Mental Status: He is alert. Mental status is at baseline.     Comments: Moves all 4 extremities Sensation in all 4  extremities  Smile symmetric   Psychiatric:        Mood and Affect: Mood normal.        Behavior: Behavior normal.    ED Results / Procedures / Treatments   Labs (all labs ordered are listed, but only abnormal results are displayed) Labs Reviewed  COMPREHENSIVE METABOLIC PANEL - Abnormal; Notable for the following components:      Result Value   BUN 26 (*)    Creatinine, Ser 1.36 (*)    Albumin 3.3 (*)    Alkaline Phosphatase 155 (*)    GFR, Estimated 55 (*)    All other components within normal limits  CBC WITH DIFFERENTIAL/PLATELET - Abnormal; Notable for the following components:   WBC 13.7 (*)    RBC 2.69 (*)    Hemoglobin 6.7 (*)    HCT 23.1 (*)    MCH 24.9 (*)    MCHC 29.0 (*)    RDW 16.6 (*)    Platelets 575 (*)    Neutro Abs 10.1 (*)    Abs Immature Granulocytes 0.30 (*)    All other components within normal limits  IRON AND TIBC - Abnormal; Notable for the following components:   Iron 23 (*)    TIBC 486 (*)    Saturation Ratios 5 (*)    All other components within normal limits  FERRITIN - Abnormal; Notable for the following components:   Ferritin 11 (*)    All other components within normal limits  RETICULOCYTES - Abnormal; Notable for the following components:   Retic Ct Pct 3.4 (*)    RBC. 2.74 (*)    Immature Retic Fract 29.6 (*)    All other components within normal limits  RESP PANEL BY RT-PCR (FLU A&B, COVID) ARPGX2  PROTIME-INR  VITAMIN B12  FOLATE  POC OCCULT BLOOD, ED  TYPE AND SCREEN  PREPARE RBC (CROSSMATCH)    EKG None  Radiology DG Chest 2 View  Result Date: 04/08/2021 CLINICAL DATA:  Shortness of breath EXAM: CHEST - 2 VIEW COMPARISON:  05/21/2018 FINDINGS: Cardiac and mediastinal contours are within normal limits. Somewhat low lung volumes. No focal pulmonary opacity. No pleural effusion or pneumothorax. No acute osseous abnormality. IMPRESSION: No acute cardiopulmonary process. Electronically Signed   By: Merilyn Baba M.D.   On:  04/08/2021 18:44    Procedures .Critical Care Performed by: Tedd Sias, PA Authorized by: Tedd Sias, PA   Critical care provider statement:    Critical care time (minutes):  35   Critical care time was exclusive of:  Separately billable procedures and treating other patients and teaching time   Critical care was necessary to treat or prevent imminent or life-threatening deterioration of the following conditions: symptomatic anemia.   Critical care was time spent personally by me on the following activities:  Discussions with consultants, evaluation of patient's response to treatment, examination of patient, review of old charts, re-evaluation of patient's condition, pulse oximetry, ordering and review of radiographic studies, ordering and review of laboratory studies and ordering and performing treatments and interventions   I assumed direction of critical care for this patient from another provider in my specialty: no     Medications Ordered in ED Medications  0.9 %  sodium chloride infusion (has no administration in time range)    ED Course  I have reviewed the triage vital signs and the nursing notes.  Pertinent labs & imaging results that were available during my care of the patient were reviewed by me and considered in my medical decision making (see chart for details).  Clinical Course as of 04/08/21 2101  Wed Apr 08, 2021  2053 Hemoglobin(!!): 6.7 [WF]  2054 Fecal Occult Blood, POC: NEGATIVE [WF]    Clinical Course User Index [WF] Tedd Sias, [place]   MDM Rules/Calculators/A&P                          Patient is 75 year old male with history of stroke history of anemia history of iron infusions never needing blood transfusions  Presented to the ER today found to be anemic by PCP.  Seems that hemoglobin is generally between 9 and 10 found to be 6.7 today he is very symptomatic seems that he has become progressively more weak and fatigued over the past 1 month  he also seems to have an element of chronic fatigue.  Anemia panel obtained.  COVID influenza negative.  Fecal occult negative and exam not indicative of GI bleed mildly elevated BUN although not impressively elevated.  Low ferritin noted.  CMP with creatinine at baseline.  No acute abnormalities.  Coags within normal limits.  Reticulocyte count is elevated consistent with more consumption or blood loss anemia versus anemia of chronic disease perhaps.  Chest x-ray personally reviewed unremarkable.  EKG nonischemic.  Type and screen obtained.  Will place orders for transfusion of 1 unit.  Patient required blood transfusion.  Meets critical care criteria.  Will admit to hospital.  Admitted to [facility]. Of hospitalist. Appreciate his expert consultation.   Jason [name] was evaluated in Emergency Department on 04/08/2021 for the symptoms described in the history of present illness. He was evaluated in the context of the global COVID-19 pandemic, which necessitated consideration that the patient might be at risk for infection with the SARS-CoV-2 virus that causes COVID-19. Institutional protocols and algorithms that pertain to the evaluation of patients at risk for COVID-19 are in a state of rapid change based on information released by regulatory bodies including the CDC and federal and state organizations. These policies and algorithms were followed during the patient's care in the ED.   Final Clinical Impression(s) / ED Diagnoses Final diagnoses:  Symptomatic anemia    Rx / DC Orders ED Discharge Orders     None        Tedd Sias, [place] 04/08/21 2102    Teressa Lower, MD 04/08/21 [phone]

## 2021-04-08 NOTE — ED Notes (Signed)
Pt to ct 

## 2021-04-08 NOTE — Assessment & Plan Note (Signed)
   Patient presenting with notable significant anemia with hemoglobin of 6.7, compared to hemoglobin of 9.3 at the [place] on 8/23.  Of note, patient has a history of melena in 2017.  Patient underwent a significant endoscopic work-up by Dr. 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  Patient does report rather regular NSAID use but seems to be confused as to how much he is actually taking.  Patient denies alcohol use.  Place patient on Protonix 80 mg IV x1 followed by 40 mg IV every 12 hours.  1 unit of packed red blood cell transfusion has been initiated by the emergency department staff  Serial CBCs every 6 hours  Monitor clinical evidence of ongoing bleeding and hemodynamic instability.  Will additionally keep patient n.p.o. after midnight and send a secure chat request to gastroenterology for consultation in the morning.

## 2021-04-08 NOTE — ED Notes (Signed)
PA at bedside to speak preform POC test and discuss transfusion

## 2021-04-08 NOTE — Assessment & Plan Note (Signed)
   Continue home regimen of psychotropic therapy

## 2021-04-08 NOTE — ED Notes (Signed)
Pt having headache and leg cramp - prior to starting transfusion - Dr. Cyd Silence at bedside and notified

## 2021-04-08 NOTE — H&P (Signed)
History and Physical    Jason [name] PTW:[id number] DOB: 1946/02/23 DOA: 04/08/2021  PCP: [facility]  Patient coming from: Home    Chief Complaint:  Chief Complaint  Patient presents with   Abnormal Lab     HPI:    75 year old male with past medical history of previous gastrointestinal bleeding (2017), bipolar disorder, chronic kidney disease stage IIIa, hyperlipidemia, hypertension, gastroesophageal reflux disease, prostate cancer (S/P prostatectomy done in [place]), carotid stenosis, previous stroke who presents to [facility] emergency department the [facility] of [facility] after being incidentally found to have a hemoglobin of 6.  Patient is a good historian however patient's spouse is at bedside who is providing portions of the history.  Patient explains that for the past several months he has been developing symptoms of slowly progressive generalized weakness.  Initially this generalized weakness was mild in intensity but progressively has become more more severe.  As patient's symptoms of generalized weakness have worsened patient is additionally developed dyspnea on exertion.  Patient denies any associated chest pain.  Patient denies any fever, sick contacts, recent travel or contact with confirmed COVID-19 infection.  Patient presented to see his new primary care provider at the [facility] on 10/4 during which a routine CBC was obtained and was found to be 6.0.  At that time patient was then directed to go to the nearest emergency department for evaluation.  Upon further questioning patient denies melena, bright red blood per rectum, hematemesis, heavy bruising, abdominal pain, alcohol use.  Patient states that he is not regularly taking his previously prescribed daily aspirin use considering his history of stroke.  Patient does state that he does regularly take Aleve for pain.  He seems to report that this is every other day but he also seems  confused about this.  Wife also adds the patient has been experiencing substantial amount of unexplained weight loss over the past several months.  Patient and wife estimate that he has lost approximately 50 pounds over the span of time.  Patient additionally has had a dramatic decrease in appetite and has had poor oral intake in the past several weeks to months as well.  Upon evaluation in the emergency department hemoglobin performed here was found to be 6.7.  Rectal exam performed by the emergency department provider revealed no evidence of gross blood.  Stool Hemoccult was also found to be negative.  1 unit of packed red blood cells was ordered to be transfused.  The hospitalist group was then called to assess the patient for admission to the hospital.  Patient explains that for  Review of Systems:   Review of Systems  Constitutional:  Positive for malaise/fatigue and weight loss.  Respiratory:  Positive for shortness of breath.   Neurological:  Positive for weakness.  All other systems reviewed and are negative.  Past Medical History:  Diagnosis Date   Acid reflux disease with ulcer    AKI (acute kidney injury) ([place])    Anemia    Arthritis    Bipolar 1 disorder ([place])    Blood transfusion without reported diagnosis    Carotid stenosis    Cataract    Chronic back pain    Complication of anesthesia 2000   reaction to anesthesia had some swelling    Depression    GERD (gastroesophageal reflux disease)    Hearing loss    Hyperlipidemia    Hypertension    Prostate cancer ([place])    prostate  cancer   PTSD (post-traumatic stress disorder)    Stroke ([place]) 08/2015   Substance abuse ([place])    TIA (transient ischemic attack)    has had several mini strokes    Past Surgical History:  Procedure Laterality Date   BACK SURGERY     CARDIAC CATHETERIZATION     cant remember when   COLONOSCOPY N/A 10/16/2015   Procedure: COLONOSCOPY;  Surgeon: [name], MD;  Location: [place];  Service: Gastroenterology;  Laterality: N/A;   COLONOSCOPY     ESOPHAGOGASTRODUODENOSCOPY N/A 10/15/2015   Procedure: ESOPHAGOGASTRODUODENOSCOPY (EGD);  Surgeon: [name], MD;  Location: [place];  Service: Gastroenterology;  Laterality: N/A;   EYE SURGERY     cataracts   GIVENS CAPSULE STUDY N/A 10/16/2015   Procedure: GIVENS CAPSULE STUDY;  Surgeon: [name], MD;  Location: [place];  Service: Gastroenterology;  Laterality: N/A;   POLYPECTOMY     PROSTATE SURGERY     RADIOLOGY WITH ANESTHESIA N/A 05/22/2018   Procedure: MRI WITH ANESTHESIA/LUMBAR SPINE;  Surgeon: Radiologist, Medication, MD;  Location: [place];  Service: Radiology;  Laterality: N/A;     reports that he quit smoking about 18 years ago. His smoking use included cigarettes. He has a 45.00 pack-year smoking history. He has never used smokeless tobacco. He reports that he does not drink alcohol and does not use drugs.  Allergies  Allergen Reactions   Lisinopril     sweating   Penicillins Other (See Comments)    Reaction:  Unknown  Has patient had a PCN reaction causing immediate rash, facial/tongue/throat swelling, SOB or lightheadedness with hypotension: Unknown Has patient had a PCN reaction causing severe rash involving mucus membranes or skin necrosis: Unknown Has patient had a PCN reaction that required hospitalization: Unknown Has patient had a PCN reaction occurring within the last 10 years: No If all of the above answers are "NO", then may proceed with Cephalosporin use.    Family History  Problem Relation Age of Onset   Stroke Mother        Hemorrhagic stroke   Cancer Father        Pancreatic cancer   Hypertension Father    Pancreatic cancer Father    Colon cancer Neg Hx    Esophageal cancer Neg Hx    Liver cancer Neg Hx    Rectal cancer Neg Hx    Stomach cancer Neg Hx      Prior to Admission medications   Medication Sig Start Date End Date Taking?  Authorizing Provider  acetaminophen (TYLENOL) 500 MG tablet Take 1,000 mg by mouth every 6 (six) hours as needed. 07/10/20  Yes [provider]  amLODipine (NORVASC) 10 MG tablet Take 10 mg by mouth daily.   Yes [provider]  Ascorbic Acid (VITAMIN C) 1000 MG tablet Take 1,000 mg by mouth daily.   Yes [provider]  aspirin EC 81 MG tablet Take 81 mg by mouth daily.   Yes [provider]  atorvastatin (LIPITOR) 40 MG tablet Take 1 tablet (40 mg total) by mouth daily at 6 PM. Patient taking differently: Take 40 mg by mouth at bedtime. 04/14/14  Yes Jason [name], Jason Addison, DO  Carboxymethylcellulose Sodium (THERATEARS OP) Place 1 drop into both eyes 4 (four) times daily as needed (for dry eyes).    Yes [provider]  ferrous sulfate 325 (65 FE) MG tablet Take 1 tablet (325 mg total) by mouth every Monday, Wednesday, and Friday. 05/24/18  Yes Florencia Reasons, MD  FLUoxetine (PROZAC) 20 MG capsule Take 20 mg by mouth daily.   Yes [provider]  fluticasone (FLONASE) 50 MCG/ACT nasal spray Place 1 spray into both nostrils daily as needed for allergies or rhinitis.   Yes [provider]  gabapentin (NEURONTIN) 300 MG capsule Take 1 capsule (300 mg total) by mouth 3 (three) times daily. Patient taking differently: Take 600 mg by mouth at bedtime. 01/12/17  Yes Barton Dubois, MD  hydrochlorothiazide (HYDRODIURIL) 25 MG tablet Take 12.5 tablets by mouth daily. 02/24/21 04/08/21 Yes [provider]  lurasidone (LATUDA) 20 MG TABS tablet Take 20 mg by mouth daily. 03/24/21  Yes [provider]  methocarbamol (ROBAXIN) 750 MG tablet Take 750 mg by mouth every 8 (eight) hours as needed for muscle spasms. 05/07/20  Yes [provider]  mirtazapine (REMERON) 45 MG tablet Take 45 mg by mouth at bedtime.   Yes [provider]  naproxen sodium (ALEVE) 220 MG tablet Take 440 mg by mouth daily as needed (pain).   Yes [provider]  pantoprazole (PROTONIX) 40 MG tablet Take 1 tablet (40 mg total) by mouth 2 (two) times daily before a meal. Patient taking differently: Take 40 mg by mouth daily as needed (heartburn). 12/28/16  Yes Rai, Ripudeep K, MD  polyethylene glycol (MIRALAX / GLYCOLAX) packet Take 17 g by mouth daily. Patient not taking: No sig reported 05/23/18   Florencia Reasons, MD  senna-docusate (SENOKOT-S) 8.6-50 MG tablet Take 1 tablet by mouth at bedtime. Patient not taking: No sig reported 05/23/18   Florencia Reasons, MD  tamsulosin (FLOMAX) 0.4 MG CAPS capsule Take 1 capsule (0.4 mg total) by mouth daily. Patient not taking: No sig reported 05/23/18   Florencia Reasons, MD  vitamin B-12 1000 MCG tablet Take 1 tablet (1,000 mcg total) by mouth daily. Patient not taking: No sig reported 05/23/18   Florencia Reasons, MD    Physical Exam: Vitals:   04/08/21 2113 04/08/21 2129 04/08/21 2130 04/08/21 2149  BP: 128/65 136/61 136/61   Pulse: 95 91 96 91  Resp: 17 20 (!) 23 15  Temp: 98.2 F (36.8 C) 98 F (36.7 C)    TempSrc: Oral Oral    SpO2: 100% 97% 96% 99%    Constitutional: Awake alert and oriented x3, no associated distress.   Skin: Notable increased pallor of the skin.  No rashes, no lesions, good skin turgor noted. Eyes: Pupils are equally reactive to light.  Significant conjunctival pallor noted without evidence of scleral icterus.  ENMT: Moist mucous membranes noted.  Posterior pharynx clear of any exudate or lesions.   Neck: normal, supple, no masses, no thyromegaly.  No evidence of jugular venous distension.   Respiratory: clear to auscultation bilaterally, no wheezing, no crackles. Normal respiratory effort. No accessory muscle use.  Cardiovascular: Regular rate and rhythm, no murmurs / rubs / gallops. No extremity edema. 2+ pedal pulses. No carotid bruits.  Chest:   Nontender without crepitus or deformity.   Back:   Nontender without crepitus or deformity. Abdomen: Abdomen is soft and nontender.  No evidence  of intra-abdominal masses.  Positive bowel sounds noted in all quadrants.   Musculoskeletal: No joint deformity upper and lower extremities. Good ROM, no contractures. Normal muscle tone.  Neurologic: CN 2-12 grossly intact. Sensation intact.  Patient moving all 4 extremities spontaneously.  Patient is following all commands.  Patient is responsive to verbal stimuli.   Psychiatric: Patient exhibits normal mood  with appropriate affect.  Patient seems to possess insight as to their current situation.     Labs on Admission: I have personally reviewed following labs and imaging studies -   CBC: Recent Labs  Lab 04/08/21 1529  WBC 13.7*  NEUTROABS 10.1*  HGB 6.7*  HCT 23.1*  MCV 85.9  PLT 160*   Basic Metabolic Panel: Recent Labs  Lab 04/08/21 1529  NA 140  K 3.7  CL 102  CO2 27  GLUCOSE 92  BUN 26*  CREATININE 1.36*  CALCIUM 9.4   GFR: CrCl cannot be calculated (Unknown ideal weight.). Liver Function Tests: Recent Labs  Lab 04/08/21 1529  AST 26  ALT 20  ALKPHOS 155*  BILITOT 0.3  PROT 7.1  ALBUMIN 3.3*   No results for input(s): LIPASE, AMYLASE in the last 168 hours. No results for input(s): AMMONIA in the last 168 hours. Coagulation Profile: Recent Labs  Lab 04/08/21 1529  INR 1.0   Cardiac Enzymes: No results for input(s): CKTOTAL, CKMB, CKMBINDEX, TROPONINI in the last 168 hours. BNP (last 3 results) No results for input(s): PROBNP in the last 8760 hours. HbA1C: No results for input(s): HGBA1C in the last 72 hours. CBG: No results for input(s): GLUCAP in the last 168 hours. Lipid Profile: No results for input(s): CHOL, HDL, LDLCALC, TRIG, CHOLHDL, LDLDIRECT in the last 72 hours. Thyroid Function Tests: No results for input(s): TSH, T4TOTAL, FREET4, T3FREE, THYROIDAB in the last 72 hours. Anemia Panel: Recent Labs    04/08/21 1529  VITAMINB12 308  FOLATE 11.8  FERRITIN 11*  TIBC 486*  IRON 23*  RETICCTPCT 3.4*   Urine analysis:    Component  Value Date/Time   COLORURINE YELLOW 05/20/2018 [place] 05/20/2018 1336   LABSPEC 1.025 05/20/2018 1336   PHURINE 5.5 05/20/2018 1336   GLUCOSEU NEGATIVE 05/20/2018 1336   HGBUR NEGATIVE 05/20/2018 [place] 05/20/2018 1336   KETONESUR 15 (A) 05/20/2018 1336   PROTEINUR NEGATIVE 05/20/2018 1336   UROBILINOGEN 0.2 05/09/2014 2115   NITRITE NEGATIVE 05/20/2018 1336   LEUKOCYTESUR NEGATIVE 05/20/2018 1336    Radiological Exams on Admission - Personally Reviewed: DG Chest 2 View  Result Date: 04/08/2021 CLINICAL DATA:  Shortness of breath EXAM: CHEST - 2 VIEW COMPARISON:  05/21/2018 FINDINGS: Cardiac and mediastinal contours are within normal limits. Somewhat low lung volumes. No focal pulmonary opacity. No pleural effusion or pneumothorax. No acute osseous abnormality. IMPRESSION: No acute cardiopulmonary process. Electronically Signed   By: Merilyn Baba M.D.   On: 04/08/2021 18:44    EKG: Personally reviewed.  Rhythm is normal sinus rhythm with heart rate of 91 bpm.  No dynamic ST segment changes appreciated.  Assessment/Plan  * Iron deficiency anemia due to chronic blood loss Patient presenting with notable significant anemia with hemoglobin of 6.7, compared to hemoglobin of 9.3 at the [place] on 8/23. Of note, patient has a history of melena in 2017.  Patient underwent a significant endoscopic work-up by Dr. 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 Patient does report rather regular NSAID use but seems to be confused as to how much he is actually taking.  Patient denies alcohol use. Place patient on Protonix 80 mg IV x1 followed by 40 mg IV every 12 hours. 1 unit of packed red blood cell transfusion has been initiated by the emergency department staff Serial CBCs  every 6 hours Monitor clinical evidence of ongoing bleeding and hemodynamic  instability. Will additionally keep patient n.p.o. after midnight and send a secure chat request to gastroenterology for consultation in the morning.  Unexplained weight loss Patient reports an approximate 50 pound unintentional weight loss over the past several months with decreasing oral intake Patient has a known history of prostate cancer status post prostatectomy Endoscopic work-up performed in 2017 revealed no evidence of malignancy Physical exam did not reveal obvious evidence of malignancy Obtaining CT imaging of the chest abdomen pelvis to evaluate for any evidence of malignancy. Consulting gastroenterology for potential endoscopic work-up  Chronic kidney disease, stage 3a ([place]) Strict intake and output monitoring Creatinine near baseline Minimizing nephrotoxic agents as much as possible Serial chemistries to monitor renal function and electrolytes   Essential hypertension Resume patients home regimen or oral antihypertensives Titrate antihypertensive regimen as necessary to achieve adequate BP control PRN intravenous antihypertensives for excessively elevated blood pressure    Bipolar 1 disorder (HCC) Continue home regimen of psychotropic therapy  Mixed hyperlipidemia Continue home regimen of statin therapy  GERD without esophagitis Patient has been placed on intravenous proton pump inhibitor as noted above.     Code Status:  Full code  code status decision has been confirmed with: patient Family Communication: wife is at bedside who has been updated on plan of care.    Status is: Observation  The patient remains OBS appropriate and will d/c before 2 midnights.  Dispo: The patient is from: Home              Anticipated d/c is to: Home              Patient currently is not medically stable to d/c.   Difficult to place patient No        Vernelle Emerald MD [facility] Pager [phone]  If 7PM-7AM, please contact  night-coverage [email or web address] Use universal [place] password for that web site. If you do not have the password, please call the hospital operator.  04/08/2021, 10:54 PM

## 2021-04-08 NOTE — Assessment & Plan Note (Signed)
   Patient has been placed on intravenous proton pump inhibitor as noted above.

## 2021-04-08 NOTE — ED Notes (Signed)
Consent signed and at bedside

## 2021-04-08 NOTE — ED Provider Notes (Signed)
Emergency Medicine Provider Triage Evaluation Note  Jason [name] , a 75 y.o. male  was evaluated in triage.  Pt complains of sent by his PCP at the [facility] for hemoglobin of 6.  He does not take any blood thinning medications.  He states that he has not had to have a blood transfusion before.  Review of Systems  Positive: Weakness, anemia Negative: Chest pain, syncope  Physical Exam  BP 134/60 (BP Location: Right Arm)   Pulse 90   Temp 98.1 F (36.7 C) (Oral)   Resp 18   SpO2 99%  Gen:   Awake, no distress   Resp:  Normal effort  MSK:   Moves extremities without difficulty  Other:  Pale.  Patient is awake and alert.  Speech is not slurred.  Medical Decision Making  Medically screening exam initiated at 3:26 PM.  Appropriate orders placed.  Tod Persia was informed that the remainder of the evaluation will be completed by another provider, this initial triage assessment does not replace that evaluation, and the importance of remaining in the ED until their evaluation is complete.  Note: Portions of this report may have been transcribed using voice recognition software. Every effort was made to ensure accuracy; however, inadvertent computerized transcription errors may be present    Ollen Gross 04/08/21 1528    Davonna Belling, MD 04/08/21 [phone]

## 2021-04-08 NOTE — Assessment & Plan Note (Signed)
   Patient reports an approximate 50 pound unintentional weight loss over the past several months with decreasing oral intake  Patient has a known history of prostate cancer status post prostatectomy  Endoscopic work-up performed in 2017 revealed no evidence of malignancy  Physical exam did not reveal obvious evidence of malignancy  Obtaining CT imaging of the chest abdomen pelvis to evaluate for any evidence of malignancy.  Consulting gastroenterology for potential endoscopic work-up

## 2021-04-08 NOTE — Assessment & Plan Note (Signed)
Resume patients home regimen or oral antihypertensives Titrate antihypertensive regimen as necessary to achieve adequate BP control PRN intravenous antihypertensives for excessively elevated blood pressure  

## 2021-04-08 NOTE — ED Notes (Addendum)
0

## 2021-04-08 NOTE — ED Triage Notes (Signed)
Pt sent by his PCP at the [facility] for hemoglobin of 6. Pt c/o weakness and shortness of breath on exertion. Denies chest pain, or obvious bleeding/dark stools.

## 2021-04-09 ENCOUNTER — Observation Stay (HOSPITAL_COMMUNITY): Payer: No Typology Code available for payment source | Admitting: Anesthesiology

## 2021-04-09 ENCOUNTER — Observation Stay (HOSPITAL_COMMUNITY): Payer: No Typology Code available for payment source

## 2021-04-09 ENCOUNTER — Encounter (HOSPITAL_COMMUNITY): Payer: Self-pay | Admitting: Internal Medicine

## 2021-04-09 ENCOUNTER — Encounter (HOSPITAL_COMMUNITY)
Admission: EM | Discharge status: Against Medical Advice | Payer: Self-pay | Source: Home / Self Care | Attending: Family Medicine

## 2021-04-09 DIAGNOSIS — Z888 Allergy status to other drugs, medicaments and biological substances status: Secondary | ICD-10-CM | POA: Diagnosis not present

## 2021-04-09 DIAGNOSIS — Z79899 Other long term (current) drug therapy: Secondary | ICD-10-CM | POA: Diagnosis not present

## 2021-04-09 DIAGNOSIS — Z800 Family history of malignant neoplasm of digestive organs: Secondary | ICD-10-CM | POA: Diagnosis not present

## 2021-04-09 DIAGNOSIS — N1831 Chronic kidney disease, stage 3a: Secondary | ICD-10-CM | POA: Diagnosis present

## 2021-04-09 DIAGNOSIS — Z87891 Personal history of nicotine dependence: Secondary | ICD-10-CM | POA: Diagnosis not present

## 2021-04-09 DIAGNOSIS — K5521 Angiodysplasia of colon with hemorrhage: Secondary | ICD-10-CM | POA: Diagnosis present

## 2021-04-09 DIAGNOSIS — K31819 Angiodysplasia of stomach and duodenum without bleeding: Secondary | ICD-10-CM

## 2021-04-09 DIAGNOSIS — Z20822 Contact with and (suspected) exposure to covid-19: Secondary | ICD-10-CM | POA: Diagnosis present

## 2021-04-09 DIAGNOSIS — K31811 Angiodysplasia of stomach and duodenum with bleeding: Secondary | ICD-10-CM

## 2021-04-09 DIAGNOSIS — Z7982 Long term (current) use of aspirin: Secondary | ICD-10-CM | POA: Diagnosis not present

## 2021-04-09 DIAGNOSIS — Z9079 Acquired absence of other genital organ(s): Secondary | ICD-10-CM | POA: Diagnosis not present

## 2021-04-09 DIAGNOSIS — D649 Anemia, unspecified: Secondary | ICD-10-CM | POA: Diagnosis present

## 2021-04-09 DIAGNOSIS — Z8546 Personal history of malignant neoplasm of prostate: Secondary | ICD-10-CM | POA: Diagnosis not present

## 2021-04-09 DIAGNOSIS — E43 Unspecified severe protein-calorie malnutrition: Secondary | ICD-10-CM | POA: Diagnosis present

## 2021-04-09 DIAGNOSIS — I129 Hypertensive chronic kidney disease with stage 1 through stage 4 chronic kidney disease, or unspecified chronic kidney disease: Secondary | ICD-10-CM | POA: Diagnosis present

## 2021-04-09 DIAGNOSIS — K219 Gastro-esophageal reflux disease without esophagitis: Secondary | ICD-10-CM | POA: Diagnosis present

## 2021-04-09 DIAGNOSIS — F319 Bipolar disorder, unspecified: Secondary | ICD-10-CM | POA: Diagnosis present

## 2021-04-09 DIAGNOSIS — R634 Abnormal weight loss: Secondary | ICD-10-CM | POA: Diagnosis present

## 2021-04-09 DIAGNOSIS — Z880 Allergy status to penicillin: Secondary | ICD-10-CM | POA: Diagnosis not present

## 2021-04-09 DIAGNOSIS — Z8673 Personal history of transient ischemic attack (TIA), and cerebral infarction without residual deficits: Secondary | ICD-10-CM | POA: Diagnosis not present

## 2021-04-09 DIAGNOSIS — Z8249 Family history of ischemic heart disease and other diseases of the circulatory system: Secondary | ICD-10-CM | POA: Diagnosis not present

## 2021-04-09 DIAGNOSIS — E782 Mixed hyperlipidemia: Secondary | ICD-10-CM | POA: Diagnosis present

## 2021-04-09 DIAGNOSIS — D500 Iron deficiency anemia secondary to blood loss (chronic): Secondary | ICD-10-CM | POA: Diagnosis present

## 2021-04-09 DIAGNOSIS — Z6824 Body mass index (BMI) 24.0-24.9, adult: Secondary | ICD-10-CM | POA: Diagnosis not present

## 2021-04-09 DIAGNOSIS — Z823 Family history of stroke: Secondary | ICD-10-CM | POA: Diagnosis not present

## 2021-04-09 HISTORY — PX: HOT HEMOSTASIS: SHX5433

## 2021-04-09 HISTORY — PX: HEMOSTASIS CLIP PLACEMENT: SHX6857

## 2021-04-09 HISTORY — PX: ENTEROSCOPY: SHX5533

## 2021-04-09 LAB — COMPREHENSIVE METABOLIC PANEL
ALT: 19 U/L (ref 0–44)
AST: 22 U/L (ref 15–41)
Albumin: 2.8 g/dL — ABNORMAL LOW (ref 3.5–5.0)
Alkaline Phosphatase: 141 U/L — ABNORMAL HIGH (ref 38–126)
Anion gap: 9 (ref 5–15)
BUN: 17 mg/dL (ref 8–23)
CO2: 26 mmol/L (ref 22–32)
Calcium: 8.9 mg/dL (ref 8.9–10.3)
Chloride: 103 mmol/L (ref 98–111)
Creatinine, Ser: 1.19 mg/dL (ref 0.61–1.24)
GFR, Estimated: >60 mL/min (ref >60)
Glucose, Bld: 101 mg/dL — ABNORMAL HIGH (ref 70–99)
Potassium: 3.7 mmol/L (ref 3.5–5.1)
Sodium: 138 mmol/L (ref 135–145)
Total Bilirubin: 0.4 mg/dL (ref 0.3–1.2)
Total Protein: 6.1 g/dL — ABNORMAL LOW (ref 6.5–8.1)

## 2021-04-09 LAB — CBC
HCT: 26 % — ABNORMAL LOW (ref 39.0–52.0)
HCT: 25.9 % — ABNORMAL LOW (ref 39.0–52.0)
HCT: 26.8 % — ABNORMAL LOW (ref 39.0–52.0)
Hemoglobin: 7.9 g/dL — ABNORMAL LOW (ref 13.0–17.0)
Hemoglobin: 8.2 g/dL — ABNORMAL LOW (ref 13.0–17.0)
Hemoglobin: 8.2 g/dL — ABNORMAL LOW (ref 13.0–17.0)
MCH: 26 pg (ref 26.0–34.0)
MCH: 26.4 pg (ref 26.0–34.0)
MCH: 25.8 pg — ABNORMAL LOW (ref 26.0–34.0)
MCHC: 30.4 g/dL (ref 30.0–36.0)
MCHC: 31.7 g/dL (ref 30.0–36.0)
MCHC: 30.6 g/dL (ref 30.0–36.0)
MCV: 85.5 fL (ref 80.0–100.0)
MCV: 83.3 fL (ref 80.0–100.0)
MCV: 84.3 fL (ref 80.0–100.0)
Platelets: 469 10*3/uL — ABNORMAL HIGH (ref 150–400)
Platelets: 467 10*3/uL — ABNORMAL HIGH (ref 150–400)
Platelets: 445 10*3/uL — ABNORMAL HIGH (ref 150–400)
RBC: 3.04 MIL/uL — ABNORMAL LOW (ref 4.22–5.81)
RBC: 3.11 MIL/uL — ABNORMAL LOW (ref 4.22–5.81)
RBC: 3.18 MIL/uL — ABNORMAL LOW (ref 4.22–5.81)
RDW: 16 % — ABNORMAL HIGH (ref 11.5–15.5)
RDW: 16.1 % — ABNORMAL HIGH (ref 11.5–15.5)
RDW: 15.9 % — ABNORMAL HIGH (ref 11.5–15.5)
WBC: 12.2 10*3/uL — ABNORMAL HIGH (ref 4.0–10.5)
WBC: 9.9 10*3/uL (ref 4.0–10.5)
WBC: 10 10*3/uL (ref 4.0–10.5)
nRBC: 0 % (ref 0.0–0.2)
nRBC: 0 % (ref 0.0–0.2)
nRBC: 0 % (ref 0.0–0.2)

## 2021-04-09 LAB — MAGNESIUM: Magnesium: 2 mg/dL (ref 1.7–2.4)

## 2021-04-09 LAB — URINALYSIS, ROUTINE W REFLEX MICROSCOPIC
Bilirubin Urine: NEGATIVE
Glucose, UA: NEGATIVE mg/dL
Hgb urine dipstick: NEGATIVE
Ketones, ur: NEGATIVE mg/dL
Leukocytes,Ua: NEGATIVE
Nitrite: NEGATIVE
Protein, ur: NEGATIVE mg/dL
Specific Gravity, Urine: 1.011 (ref 1.005–1.030)
pH: 8 (ref 5.0–8.0)

## 2021-04-09 SURGERY — ENTEROSCOPY
Anesthesia: Monitor Anesthesia Care

## 2021-04-09 MED ORDER — LACTATED RINGERS IV SOLN: INTRAVENOUS | Status: AC

## 2021-04-09 MED ORDER — PROPOFOL 500 MG/50ML IV EMUL
INTRAVENOUS | Status: DC | PRN
  Administered 2021-04-09: 75 ug/kg/min via INTRAVENOUS

## 2021-04-09 MED ORDER — PROPOFOL 10 MG/ML IV BOLUS
INTRAVENOUS | Status: DC | PRN
  Administered 2021-04-09: 20 mg via INTRAVENOUS
  Administered 2021-04-09 (×2): 10 mg via INTRAVENOUS

## 2021-04-09 MED ORDER — IOHEXOL 350 MG/ML SOLN
100.0000 mL | Freq: Once | INTRAVENOUS | Status: AC | PRN
  Administered 2021-04-09: 100 mL via INTRAVENOUS

## 2021-04-09 MED ORDER — PANTOPRAZOLE SODIUM 40 MG PO TBEC
40.0000 mg | DELAYED_RELEASE_TABLET | Freq: Two times a day (BID) | ORAL | Status: DC
  Administered 2021-04-09 – 2021-04-10 (×3): 40 mg via ORAL
  Filled 2021-04-09 (×3): qty 1

## 2021-04-09 MED ORDER — PHENYLEPHRINE HCL-NACL 20-0.9 MG/250ML-% IV SOLN
INTRAVENOUS | Status: DC | PRN
  Administered 2021-04-09: 50 ug/min via INTRAVENOUS

## 2021-04-09 MED ORDER — GLUCAGON HCL RDNA (DIAGNOSTIC) 1 MG IJ SOLR
INTRAMUSCULAR | Status: DC | PRN
  Administered 2021-04-09: .25 mg via INTRAVENOUS

## 2021-04-09 MED ORDER — SUCRALFATE 1 GM/10ML PO SUSP
1.0000 g | Freq: Three times a day (TID) | ORAL | Status: DC
  Administered 2021-04-09 – 2021-04-11 (×7): 1 g via ORAL
  Filled 2021-04-09 (×7): qty 10

## 2021-04-09 MED ORDER — PHENYLEPHRINE 40 MCG/ML (10ML) SYRINGE FOR IV PUSH (FOR BLOOD PRESSURE SUPPORT)
PREFILLED_SYRINGE | INTRAVENOUS | Status: DC | PRN
  Administered 2021-04-09: 120 ug via INTRAVENOUS
  Administered 2021-04-09 (×2): 80 ug via INTRAVENOUS
  Administered 2021-04-09: 120 ug via INTRAVENOUS

## 2021-04-09 NOTE — ED Notes (Signed)
Received pt from Endo, drowsy but easily arousable. Wife at bedside. Denies any complaints at this time.

## 2021-04-09 NOTE — Anesthesia Procedure Notes (Signed)
Procedure Name: MAC Date/Time: 04/09/2021 1:36 PM Performed by: Wilburn Cornelia, CRNA Pre-anesthesia Checklist: Patient identified, Emergency Drugs available, Suction available, Patient being monitored and Timeout performed Patient Re-evaluated:Patient Re-evaluated prior to induction Oxygen Delivery Method: Nasal cannula Placement Confirmation: positive ETCO2 and breath sounds checked- equal and bilateral Dental Injury: Teeth and Oropharynx as per pre-operative assessment

## 2021-04-09 NOTE — Op Note (Addendum)
[facility] Patient Name: Jason [name] Procedure Date : 04/09/2021 MRN: [id number] Attending MD: Gladstone Pih. Candis Schatz , MD Date of Birth: [birth year] CSN: [id number] Age: 75 Admit Type: Inpatient Procedure:                Small bowel enteroscopy Indications:              Suspected upper gastrointestinal bleeding in                            patient with unexplained iron deficiency anemia,                            Arteriovenous malformation in the stomach,                            Arteriovenous malformation in the small intestine Providers:                Ulysess Witz E. Candis Schatz, MD, Brooke Person, Janie                            Billups, Technician, Merrilyn Puma, CRNA Referring MD:              Medicines:                Monitored Anesthesia Care Complications:            No immediate complications. Estimated Blood Loss:     Estimated blood loss was minimal. Procedure:                Pre-Anesthesia Assessment:                           - Prior to the procedure, a History and Physical                            was performed, and patient medications and                            allergies were reviewed. The patient's tolerance of                            previous anesthesia was also reviewed. The risks                            and benefits of the procedure and the sedation                            options and risks were discussed with the patient.                            All questions were answered, and informed consent                            was obtained. Prior Anticoagulants: The patient has  taken no previous anticoagulant or antiplatelet                            agents. ASA Grade Assessment: III - A patient with                            severe systemic disease. After reviewing the risks                            and benefits, the patient was deemed in                            satisfactory condition to undergo the  procedure.                           After obtaining informed consent, the endoscope was                            passed under direct vision. Throughout the                            procedure, the patient's blood pressure, pulse, and                            oxygen saturations were monitored continuously. The                            PCF-H190TL ([id number] Olympus slim colonoscope was                            introduced through the mouth and advanced to the                            jejunum, to the 150 cm mark (from the incisors).                            The small bowel enteroscopy was accomplished                            without difficulty. The patient tolerated the                            procedure well. Scope In: Scope Out: Findings:      The examined esophagus was normal.      A few small angiodysplastic lesions with no bleeding were found in the       gastric body. Coagulation for tissue destruction using argon plasma at 1       liter/minute and 20 watts was successful. Estimated blood loss was       minimal.      The exam of the stomach was otherwise normal.      Multiple angiodysplastic lesions with no bleeding were found in the       duodenal bulb, in the second portion of the duodenum, in the third  portion of the duodenum and in the fourth portion of the duodenum.       Coagulation for tissue destruction using argon plasma at 1 liter/minute       and 20 watts was successful. Estimated blood loss was minimal.      The exam of the duodenum was otherwise normal.      Multiple angiodysplastic lesions with no bleeding were found in the       proximal jejunum. Coagulation for tissue destruction using argon plasma       at 1 liter/minute and 20 watts was successful. Estimated blood loss was       minimal. To prevent bleeding post-maneuver, one hemostatic clip was       successfully placed (MR conditional). There was no bleeding at the end       of the  procedure.      Exam of the jejunum was otherwise normal. Impression:               - Normal esophagus.                           - A few non-bleeding angiodysplastic lesions in the                            stomach. Treated with argon plasma coagulation                            (APC).                           - Multiple non-bleeding angiodysplastic lesions in                            the duodenum. Treated with argon plasma coagulation                            (APC).                           - Multiple non-bleeding angiodysplastic lesions in                            the jejunum. Treated with argon plasma coagulation                            (APC). Clip (MR conditional) was placed.                           - No specimens collected. A total of about 10-12                            lesions were treated Recommendation:           - Return patient to hospital ward for observation.                           - Resume regular diet.                           -  Use Protonix (pantoprazole) 40 mg PO BID for 8                            weeks.                           - Use sucralfate suspension 1 gram PO BID for 2                            weeks.                           - If no evidence of further bleeding overnight, can                            go home tomorrow.                           - Recommend follow up with Primary care or primary                            gastroenterologist to recheck CBC as outpatient in                            2 weeks. Procedure Code(s):        --- Professional ---                           [phone], Small intestinal endoscopy, enteroscopy                            beyond second portion of duodenum, not including                            ileum; with ablation of tumor(s), polyp(s), or                            other lesion(s) not amenable to removal by hot                            biopsy forceps, bipolar cautery or snare technique Diagnosis  Code(s):        --- Professional ---                           I50.277, Angiodysplasia of stomach and duodenum                            without bleeding                           D50.9, Iron deficiency anemia, unspecified CPT copyright 2019 American Medical Association. All rights reserved. The codes documented in this report are preliminary and upon coder review may  be revised to meet current compliance requirements. Isaac Dubie E. Candis Schatz, MD 04/09/2021 2:35:26 PM This report has been signed electronically. Number of Addenda: 0

## 2021-04-09 NOTE — Progress Notes (Signed)
PROGRESS NOTE    Jason [name]  QIH:[id number] DOB: [birth year] DOA: 04/08/2021 PCP: [facility]   Chief Complaint  Patient presents with   Abnormal Lab    Brief Narrative:  75 year old male with past medical history of previous gastrointestinal bleeding (2017), bipolar disorder, chronic kidney disease stage IIIa, hyperlipidemia, hypertension, gastroesophageal reflux disease, prostate cancer (S/P prostatectomy done in [place]), carotid stenosis, previous stroke who [facility] after being incidentally found to have Steve Gregg hemoglobin of 6.  Assessment & Plan:   Principal Problem:   Iron deficiency anemia due to chronic blood loss Active Problems:   Bipolar 1 disorder (HCC)   Essential hypertension   GERD without esophagitis   Mixed hyperlipidemia   Chronic kidney disease, stage 3a (HCC)   Unexplained weight loss   AVM (arteriovenous malformation) of stomach, acquired with hemorrhage   AVM (arteriovenous malformation) of small bowel, acquired with hemorrhage  * Iron deficiency anemia due to chronic blood loss Patient presenting with notable significant anemia with hemoglobin of 6.7, compared to hemoglobin of 9.3 at the [place] on 8/23. Of note, patient has Jason [name] history of melena in 2017.  Patient underwent Raydon Chappuis significant endoscopic work-up by Dr. 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 Now s/p small bowel endoscopy with Shagun Wordell few non bleeding angiodysplastic lesions in the stomach and duodenum and proximal jejunum.  Treated with APC.  Clip placed.  10-12 lesions treated.   Gi recommending PPI BID x 8 weeks, sucralfate 1 gram BID x 2 weeks S/p 1 unit pRBC   Unexplained weight loss Patient reports an approximate 50 pound unintentional weight loss over the past several months with  decreasing oral intake Patient has Mackay Hanauer known history of prostate cancer status post prostatectomy Endoscopic work-up performed in 2017 revealed no evidence of malignancy Physical exam did not reveal obvious evidence of malignancy CT chest abdomen/pelvis notable for stable nodular density in RUL c/w benign scarring, stable L adrenal lesion, diverticulosis without diverticulitis   Chronic kidney disease, stage 3a (HCC) Strict intake and output monitoring Creatinine near baseline Minimizing nephrotoxic agents as much as possible Serial chemistries to monitor renal function and electrolytes   Essential hypertension Amlodipine   Bipolar 1 disorder ([place]) Latuda, prozac   Mixed hyperlipidemia Continue home regimen of statin therapy   GERD without esophagitis Patient has been placed on intravenous proton pump inhibitor as noted above.  DVT prophylaxis: SCD Code Status: full  Family Communication: none at bedside Disposition:   Status is: Observation  The patient will require care spanning > 2 midnights and should be moved to inpatient because: Inpatient level of care appropriate due to severity of illness  Dispo: The patient is from: Home              Anticipated d/c is to: Home              Patient currently is not medically stable to d/c.   Difficult to place patient No       Consultants:  GI  Procedures:  Impression - Normal esophagus. - Garwood Wentzell few non-bleeding angiodysplastic lesions in the stomach. Treated with argon plasma coagulation (APC). - Multiple non-bleeding angiodysplastic lesions in the duodenum. Treated with argon plasma coagulation (APC). - Multiple non-bleeding angiodysplastic lesions in the duodenum. Treated with argon plasma coagulation (APC). Clip (MR  conditional) was placed. - No specimens collected. Tzivia Oneil total of about 10-12 lesions were treated Recommendations - Return patient to hospital ward for observation. - Resume regular diet. - Use Protonix  (pantoprazole) 40 mg PO BID for 8 weeks. - Use sucralfate suspension 1 gram PO BID for 2 weeks. - If no evidence of further bleeding overnight, can go home tomorrow. - Recommend follow up with Primary  Antimicrobials:  Anti-infectives (From admission, onward)    None      Subjective: No complaints  Objective: Vitals:   04/09/21 1435 04/09/21 1445 04/09/21 1612 04/09/21 1649  BP: 135/60 138/62 124/70 127/71  Pulse: 76 80 88   Resp: (!) 22 (!) 22 19 16   Temp:    98.2 F (36.8 C)  TempSrc:    Oral  SpO2: 99% 100% 100% 98%  Weight:      Height:        Intake/Output Summary (Last 24 hours) at 04/09/2021 1838 Last data filed at 04/09/2021 1413 Gross per 24 hour  Intake 1000 ml  Output --  Net 1000 ml   Filed Weights   04/09/21 1322  Weight: 65.3 kg    Examination:  General exam: Appears calm and comfortable  Respiratory system: unlabored Cardiovascular system:RRR Gastrointestinal system: Abdomen is nondistended, soft and nontender Central nervous system: Alert and oriented. No focal neurological deficits. Extremities: no LEE Skin: No rashes, lesions or ulcers Psychiatry: Judgement and insight appear normal. Mood & affect appropriate.     Data Reviewed: I have personally reviewed following labs and imaging studies  CBC: Recent Labs  Lab 04/08/21 1529 04/09/21 0316 04/09/21 0800  WBC 13.7* 12.2* 9.9  NEUTROABS 10.1*  --   --   HGB 6.7* 7.9* 8.2*  HCT 23.1* 26.0* 25.9*  MCV 85.9 85.5 83.3  PLT 575* 469* 467*    Basic Metabolic Panel: Recent Labs  Lab 04/08/21 1529 04/09/21 0316  NA 140 138  K 3.7 3.7  CL 102 103  CO2 27 26  GLUCOSE 92 101*  BUN 26* 17  CREATININE 1.36* 1.19  CALCIUM 9.4 8.9  MG  --  2.0    GFR: Estimated Creatinine Clearance: 45.6 mL/min (by C-G formula based on SCr of 1.19 mg/dL).  Liver Function Tests: Recent Labs  Lab 04/08/21 1529 04/09/21 0316  AST 26 22  ALT 20 19  ALKPHOS 155* 141*  BILITOT 0.3 0.4  PROT  7.1 6.1*  ALBUMIN 3.3* 2.8*    CBG: No results for input(s): GLUCAP in the last 168 hours.   Recent Results (from the past 240 hour(s))  Resp Panel by RT-PCR (Flu Xylina Rhoads&B, Covid) Nasopharyngeal Swab     Status: None   Collection Time: 04/08/21  7:26 PM   Specimen: Nasopharyngeal Swab; Nasopharyngeal(NP) swabs in vial transport medium  Result Value Ref Range Status   SARS Coronavirus 2 by RT PCR NEGATIVE NEGATIVE Final    Comment: (NOTE) SARS-CoV-2 target nucleic acids are NOT DETECTED.  The SARS-CoV-2 RNA is generally detectable in upper respiratory specimens during the acute phase of infection. The lowest concentration of SARS-CoV-2 viral copies this assay can detect is 138 copies/mL. Mecca Guitron negative result does not preclude SARS-Cov-2 infection and should not be used as the sole basis for treatment or other patient management decisions. Corinne Goucher negative result may occur with  improper specimen collection/handling, submission of specimen other than nasopharyngeal swab, presence of viral mutation(s) within the areas targeted by this assay, and inadequate number of viral copies(<138 copies/mL). Dawnelle Warman negative result  must be combined with clinical observations, patient history, and epidemiological information. The expected result is Negative.  Fact Sheet for Patients:  EntrepreneurPulse.com.au  Fact Sheet for Healthcare Providers:  IncredibleEmployment.be  This test is no t yet approved or cleared by the [place] FDA and  has been authorized for detection and/or diagnosis of SARS-CoV-2 by FDA under an Emergency Use Authorization (EUA). This EUA will remain  in effect (meaning this test can be used) for the duration of the COVID-19 declaration under Section 564(b)(1) of the Act, 21 U.S.C.section 360bbb-3(b)(1), unless the authorization is terminated  or revoked sooner.       Influenza Ranjit Ashurst by PCR NEGATIVE NEGATIVE Final   Influenza B by PCR NEGATIVE  NEGATIVE Final    Comment: (NOTE) The Xpert Xpress SARS-CoV-2/FLU/RSV plus assay is intended as an aid in the diagnosis of influenza from Nasopharyngeal swab specimens and should not be used as Bellami Farrelly sole basis for treatment. Nasal washings and aspirates are unacceptable for Xpert Xpress SARS-CoV-2/FLU/RSV testing.  Fact Sheet for Patients: EntrepreneurPulse.com.au  Fact Sheet for Healthcare Providers: IncredibleEmployment.be  This test is not yet approved or cleared by the [place] FDA and has been authorized for detection and/or diagnosis of SARS-CoV-2 by FDA under an Emergency Use Authorization (EUA). This EUA will remain in effect (meaning this test can be used) for the duration of the COVID-19 declaration under Section 564(b)(1) of the Act, 21 U.S.C. section 360bbb-3(b)(1), unless the authorization is terminated or revoked.  Performed at [facility], [place] [address], [place], [place] [address]          Radiology Studies: DG Chest 2 View  Result Date: 04/08/2021 CLINICAL DATA:  Shortness of breath EXAM: CHEST - 2 VIEW COMPARISON:  05/21/2018 FINDINGS: Cardiac and mediastinal contours are within normal limits. Somewhat low lung volumes. No focal pulmonary opacity. No pleural effusion or pneumothorax. No acute osseous abnormality. IMPRESSION: No acute cardiopulmonary process. Electronically Signed   By: Merilyn Baba M.D.   On: 04/08/2021 18:44   CT CHEST W CONTRAST  Result Date: 04/09/2021 CLINICAL DATA:  Decreased hemoglobin with shortness of breath EXAM: CT CHEST, ABDOMEN, AND PELVIS WITH CONTRAST TECHNIQUE: Multidetector CT imaging of the chest, abdomen and pelvis was performed following the standard protocol during bolus administration of intravenous contrast. CONTRAST:  152mL OMNIPAQUE IOHEXOL 350 MG/ML SOLN COMPARISON:  None. FINDINGS: CT CHEST FINDINGS Cardiovascular: Thoracic aorta demonstrates atherosclerotic calcifications  without aneurysmal dilatation or dissection. Coronary calcifications are seen. No cardiac enlargement is noted. No large central pulmonary embolus is seen. Mediastinum/Nodes: Thoracic inlet is within normal limits. No sizable hilar or mediastinal adenopathy is noted. No axillary adenopathy is seen. The esophagus as visualized is within normal limits. Lungs/Pleura: Lungs are well aerated bilaterally. Mild linear scarring is noted in the bases bilaterally. Stable nodular density in the right upper lobe is seen measuring 10 mm unchanged from 2018 and consistent with Rafe Mackowski benign area of scarring. No other focal nodules are seen. The right basilar nodule seen previously has resolved in the interval. Musculoskeletal: Degenerative changes of the thoracic spine are noted. No acute bony abnormality is seen. CT ABDOMEN PELVIS FINDINGS Hepatobiliary: Liver and gallbladder are within normal limits. Pancreas: Unremarkable. No pancreatic ductal dilatation or surrounding inflammatory changes. Spleen: Normal in size without focal abnormality. Adrenals/Urinary Tract: Adrenal glands demonstrate Khyler Eschmann left adrenal lesion consistent with adenoma stable from prior exam in 2018. It measures up to 16 mm. Kidneys demonstrate Angeleigh Chiasson normal enhancement pattern bilaterally. Normal excretion is  noted on delayed images. No calculi or obstructive changes are seen. The bladder is partially distended. Stomach/Bowel: Scattered diverticular change of the colon is noted. No evidence of diverticulitis is seen. No obstructive changes are noted. Appendix is air-filled and within normal limits. Small bowel and stomach appear within normal limits. Vascular/Lymphatic: Aortic atherosclerosis. No enlarged abdominal or pelvic lymph nodes. Reproductive: Prostate is been surgically removed. Multiple surgical staples are seen. Other: No abdominal wall hernia or abnormality. No abdominopelvic ascites. Musculoskeletal: Degenerative changes of lumbar spine are seen.  IMPRESSION: CT of the chest: Stable nodular density in the right upper lobe consistent with benign scarring unchanged from 2018. No acute abnormality in the chest is noted. CT of the abdomen and pelvis: Stable left adrenal lesion. Diverticulosis without diverticulitis. Changes consistent with prior prostatectomy consistent with the given clinical history. No other focal abnormality is noted. Electronically Signed   By: Inez Catalina M.D.   On: 04/09/2021 00:41   CT ABDOMEN PELVIS W CONTRAST  Result Date: 04/09/2021 CLINICAL DATA:  Decreased hemoglobin with shortness of breath EXAM: CT CHEST, ABDOMEN, AND PELVIS WITH CONTRAST TECHNIQUE: Multidetector CT imaging of the chest, abdomen and pelvis was performed following the standard protocol during bolus administration of intravenous contrast. CONTRAST:  129mL OMNIPAQUE IOHEXOL 350 MG/ML SOLN COMPARISON:  None. FINDINGS: CT CHEST FINDINGS Cardiovascular: Thoracic aorta demonstrates atherosclerotic calcifications without aneurysmal dilatation or dissection. Coronary calcifications are seen. No cardiac enlargement is noted. No large central pulmonary embolus is seen. Mediastinum/Nodes: Thoracic inlet is within normal limits. No sizable hilar or mediastinal adenopathy is noted. No axillary adenopathy is seen. The esophagus as visualized is within normal limits. Lungs/Pleura: Lungs are well aerated bilaterally. Mild linear scarring is noted in the bases bilaterally. Stable nodular density in the right upper lobe is seen measuring 10 mm unchanged from 2018 and consistent with Fatima Fedie benign area of scarring. No other focal nodules are seen. The right basilar nodule seen previously has resolved in the interval. Musculoskeletal: Degenerative changes of the thoracic spine are noted. No acute bony abnormality is seen. CT ABDOMEN PELVIS FINDINGS Hepatobiliary: Liver and gallbladder are within normal limits. Pancreas: Unremarkable. No pancreatic ductal dilatation or surrounding  inflammatory changes. Spleen: Normal in size without focal abnormality. Adrenals/Urinary Tract: Adrenal glands demonstrate Kitai Purdom left adrenal lesion consistent with adenoma stable from prior exam in 2018. It measures up to 16 mm. Kidneys demonstrate Merek Niu normal enhancement pattern bilaterally. Normal excretion is noted on delayed images. No calculi or obstructive changes are seen. The bladder is partially distended. Stomach/Bowel: Scattered diverticular change of the colon is noted. No evidence of diverticulitis is seen. No obstructive changes are noted. Appendix is air-filled and within normal limits. Small bowel and stomach appear within normal limits. Vascular/Lymphatic: Aortic atherosclerosis. No enlarged abdominal or pelvic lymph nodes. Reproductive: Prostate is been surgically removed. Multiple surgical staples are seen. Other: No abdominal wall hernia or abnormality. No abdominopelvic ascites. Musculoskeletal: Degenerative changes of lumbar spine are seen. IMPRESSION: CT of the chest: Stable nodular density in the right upper lobe consistent with benign scarring unchanged from 2018. No acute abnormality in the chest is noted. CT of the abdomen and pelvis: Stable left adrenal lesion. Diverticulosis without diverticulitis. Changes consistent with prior prostatectomy consistent with the given clinical history. No other focal abnormality is noted. Electronically Signed   By: Inez Catalina M.D.   On: 04/09/2021 00:41        Scheduled Meds:  amLODipine  10 mg Oral Daily   atorvastatin  40 mg Oral QHS   FLUoxetine  20 mg Oral Daily   gabapentin  600 mg Oral QHS   lurasidone  20 mg Oral Daily   mirtazapine  45 mg Oral QHS   pantoprazole  40 mg Oral BID   sucralfate  1 g Oral TID WC & HS   Continuous Infusions:  lactated ringers 75 mL/hr at 04/09/21 1110     LOS: 0 days    Time spent: over 30 min    Fayrene Helper, MD [facility]   To contact the attending provider between 7A-7P or the  covering provider during after hours 7P-7A, please log into the web site [email or web address] and access using universal  password for that web site. If you do not have the password, please call the hospital operator.  04/09/2021, 6:38 PM

## 2021-04-09 NOTE — ED Notes (Signed)
Pt resting/ sleeping, easily arousable, alert, NAD, calm, interactive, resps e/u, speaking in clear complete sentences, VSS, denies pain or nausea.

## 2021-04-09 NOTE — ED Notes (Signed)
Received pt ao x 4, NAD. Warm blanket given and repositioned on the bed. Pt asked when he'll get a room, this RN explained bed situation to pt. Denies any complaints at this time.

## 2021-04-09 NOTE — Anesthesia Preprocedure Evaluation (Addendum)
Anesthesia Evaluation  Patient identified by MRN, date of birth, ID band Patient awake    Reviewed: Allergy & Precautions, NPO status , Patient's Chart, lab work & pertinent test results  Airway Mallampati: II  TM Distance: >3 FB Neck ROM: Full    Dental  (+) Teeth Intact, Dental Advisory Given   Pulmonary neg pulmonary ROS, former smoker,    Pulmonary exam normal breath sounds clear to auscultation       Cardiovascular hypertension, Pt. on medications Normal cardiovascular exam Rhythm:Regular Rate:Normal  TTE 2019 - Left ventricle: The cavity size was normal. There was mild focal  basal hypertrophy of the septum. Systolic function was normal.  The estimated ejection fraction was in the range of 60% to 65%.  Wall motion was normal; there were no regional wall motion  abnormalities. Left ventricular diastolic function parameters  were normal.  - Mitral valve: Calcified annulus. Mildly thickened, mildly  calcified leaflets .  - Left atrium: The atrium was mildly dilated.  - Atrial septum: No defect or patent foramen ovale was identified.    Neuro/Psych PSYCHIATRIC DISORDERS Anxiety Depression Bipolar Disorder CVA    GI/Hepatic Neg liver ROS, PUD, GERD  Medicated and Controlled,  Endo/Other  negative endocrine ROS  Renal/GU Renal InsufficiencyRenal disease  negative genitourinary   Musculoskeletal  (+) Arthritis ,   Abdominal   Peds  Hematology  (+) Blood dyscrasia, anemia ,   Anesthesia Other Findings 75 year old male with past medical history of previous gastrointestinal bleeding (2017), bipolar disorder, chronic kidney disease stage IIIa, hyperlipidemia, hypertension, gastroesophageal reflux disease, prostate cancer (S/P prostatectomy done in [place]), carotid stenosis, previous stroke who [facility] to [facility] after being  incidentally found to have a hemoglobin of 6. Hgb 8.2  Reproductive/Obstetrics                            Anesthesia Physical Anesthesia Plan  ASA: 3  Anesthesia Plan: MAC   Post-op Pain Management:    Induction: Intravenous  PONV Risk Score and Plan: Propofol infusion and Treatment may vary due to age or medical condition  Airway Management Planned: Natural Airway  Additional Equipment:   Intra-op Plan:   Post-operative Plan:   Informed Consent: I have reviewed the patients History and Physical, chart, labs and discussed the procedure including the risks, benefits and alternatives for the proposed anesthesia with the patient or authorized representative who has indicated his/her understanding and acceptance.     Dental advisory given  Plan Discussed with: CRNA  Anesthesia Plan Comments:         Anesthesia Quick Evaluation

## 2021-04-09 NOTE — Plan of Care (Signed)
  Problem: Nutrition: Goal: Adequate nutrition will be maintained Outcome: Progressing   

## 2021-04-09 NOTE — Discharge Instructions (Signed)

## 2021-04-09 NOTE — Transfer of Care (Signed)
Immediate Anesthesia Transfer of Care Note  Patient: Jason [name]  Procedure(s) Performed: ENTEROSCOPY HOT HEMOSTASIS (ARGON PLASMA COAGULATION/BICAP) HEMOSTASIS CLIP PLACEMENT  Patient Location: Endoscopy Unit  Anesthesia Type:MAC  Level of Consciousness: drowsy  Airway & Oxygen Therapy: Patient Spontanous Breathing and Patient connected to nasal cannula oxygen  Post-op Assessment: Report given to RN and Post -op Vital signs reviewed and stable  Post vital signs: Reviewed and stable  Last Vitals:  Vitals Value Taken Time  BP 107/58 04/09/21 1425  Temp    Pulse 81 04/09/21 1425  Resp 27 04/09/21 1425  SpO2 99 % 04/09/21 1425  Vitals shown include unvalidated device data.  Last Pain:  Vitals:   04/09/21 1322  TempSrc: Temporal  PainSc: 0-No pain         Complications: No notable events documented.

## 2021-04-09 NOTE — ED Notes (Signed)
Alert, NAD, calm, interactive. IVF continue. VSS. GI at [facility].

## 2021-04-09 NOTE — Consult Note (Addendum)
Referring Provider:  [facility]         Primary Care Physician:  [facility] Primary Gastroenterologist:  Ileene Patrick, MD Reason for Consultation:  anemia                 ASSESSMENT / PLAN   # 75 yo male with severe, symptomatic and recurrent  iron deficiency anemia in setting on NSAIDS. He has chronic anemia with baseline hgb in 9 to 10 range. Presenting with hgb of 6.7, low ferritin. No overt GI bleed. Heme negative. CT chest/ abd / pelvis unrevealing. 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 Current anemia probably secondary to same. He is up to date on colonoscopy done March 2021 ( results below).  --Will likely need EGD vrs small bowel enteroscopy. Keep NPO until time of procedure decided. If procedure to be done tomorrow then I will give him clears .  --IV iron sometime prior to discharge.  --Continue BID IV PPI --trend CBC. No active bleeding, heme negative     HISTORY OF PRESENT ILLNESS                                                                                                                         Chief Complaint: anemia  Jason [name] is a 75 y.o. male with a past medical history significant for  HTN, CKD 3, CVA, HLD, spinal stenosis of lumbar spine, bipolar disorder, prostate cancer s/p prostatectomy, adenomatous colon polyps, small bowel AVMs. .  See PMH for any additional medical history.   History comes from patient.   Patient sent to ED by PCP at the [facility] for symptomatic anemia ( hgb 6). In ED his hgb was 6.7, MCV 83, platelets 467. BUN 26, alk phos 155, albumin 3.3, bili 0.3. Ferritin 11. CT chest, abd / pelvis unrevealing.   Hgb improved to 8.2 post uPRBC last evening. Patient denies overt GI bleeding. He had an isolated episode of small volume emesis one day last week ( ? Color). He developed acid reflux symptoms about one month ago but says it has improved. Other than that he hasn't had any  GI issues. He takes Aleve sometimes but was taking more a few weeks ago after a move. He has been short of breath and tired recently  Patient describes frequent episodes of "cold sweats" that he thought was med related so he stopped all of his home meds in January. He took no medications for months but has since restarted some of them. After stopping home medications patient says he lost his appetite and subsequently lost about 50 pounds.   SIGNIFICANT DIAGNOTIC STUDIES   CT chest / abd / pelvis IMPRESSION: CT of the chest: Stable nodular density in the right upper lobe consistent with benign scarring unchanged from 2018.   No acute abnormality in the chest is noted.   CT of the abdomen and pelvis: Stable left adrenal lesion.  Diverticulosis without diverticulitis.   Changes consistent with prior prostatectomy consistent with the given clinical history.   No other focal abnormality is noted    PREVIOUS ENDOSCOPIC EVALUATIONS    2017 Video capsule study - 1 erythematous spot in gastric body. Possible small polyp vrs lymphangiectasia at 1 hr 17 minutes not completely visualized. Two small AVMs at 2 hours 8 minutes   April 2017 EGD for melena and iron deficiency anemia --normal.   March 2021 polyp surveillance colonoscopy  --Complete exam, adequate prep.  --Diverticulosis in the entire examined colon, associated with luminal narrowing in the sigmoid colon. - Four 3 to 4 mm polyps in the transverse colon, removed with a cold snare. Resected and retrieved. - The examination was otherwise normal on direct and retroflexion views.  Surgical [P], transverse, polyp (4) - TUBULAR ADENOMA (X3 FRAGMENTS). - BENIGN COLONIC MUCOSA (X3 FRAGMENTS) - NO HIGH GRADE DYSPLASIA OR MALIGNANCY.   Past Medical History:  Diagnosis Date   Acid reflux disease with ulcer    AKI (acute kidney injury) ([place])    Anemia    Arthritis    Bipolar 1 disorder ([place])    Blood transfusion without reported  diagnosis    Carotid stenosis    Cataract    Chronic back pain    Complication of anesthesia 2000   reaction to anesthesia had some swelling    Depression    GERD (gastroesophageal reflux disease)    Hearing loss    Hyperlipidemia    Hypertension    Prostate cancer ([place])    prostate cancer   PTSD (post-traumatic stress disorder)    Stroke ([place]) 08/2015   Substance abuse ([place])    TIA (transient ischemic attack)    has had several mini strokes    Past Surgical History:  Procedure Laterality Date   BACK SURGERY     CARDIAC CATHETERIZATION     cant remember when   COLONOSCOPY N/A 10/16/2015   Procedure: COLONOSCOPY;  Surgeon: [name], MD;  Location: [place];  Service: Gastroenterology;  Laterality: N/A;   COLONOSCOPY     ESOPHAGOGASTRODUODENOSCOPY N/A 10/15/2015   Procedure: ESOPHAGOGASTRODUODENOSCOPY (EGD);  Surgeon: [name], MD;  Location: [place];  Service: Gastroenterology;  Laterality: N/A;   EYE SURGERY     cataracts   GIVENS CAPSULE STUDY N/A 10/16/2015   Procedure: GIVENS CAPSULE STUDY;  Surgeon: [name], MD;  Location: [place];  Service: Gastroenterology;  Laterality: N/A;   POLYPECTOMY     PROSTATE SURGERY     RADIOLOGY WITH ANESTHESIA N/A 05/22/2018   Procedure: MRI WITH ANESTHESIA/LUMBAR SPINE;  Surgeon: Radiologist, Medication, MD;  Location: [place];  Service: Radiology;  Laterality: N/A;    Prior to Admission medications   Medication Sig Start Date End Date Taking? Authorizing Provider  acetaminophen (TYLENOL) 500 MG tablet Take 1,000 mg by mouth every 6 (six) hours as needed. 07/10/20  Yes [provider]  amLODipine (NORVASC) 10 MG tablet Take 10 mg by mouth daily.   Yes [provider]  Ascorbic Acid (VITAMIN C) 1000 MG tablet Take 1,000 mg by mouth daily.   Yes [provider]  aspirin EC 81 MG tablet Take 81 mg by mouth daily.   Yes [provider]  atorvastatin  (LIPITOR) 40 MG tablet Take 1 tablet (40 mg total) by mouth daily at 6 PM. Patient taking differently: Take 40 mg by mouth at bedtime. 04/14/14  Yes Mikhail, Maryann, DO  Carboxymethylcellulose Sodium ([place] OP)  Place 1 drop into both eyes 4 (four) times daily as needed (for dry eyes).    Yes [provider]  ferrous sulfate 325 (65 FE) MG tablet Take 1 tablet (325 mg total) by mouth every Monday, Wednesday, and Friday. 05/24/18  Yes Florencia Reasons, MD  FLUoxetine (PROZAC) 20 MG capsule Take 20 mg by mouth daily.   Yes [provider]  fluticasone (FLONASE) 50 MCG/ACT nasal spray Place 1 spray into both nostrils daily as needed for allergies or rhinitis.   Yes [provider]  gabapentin (NEURONTIN) 300 MG capsule Take 1 capsule (300 mg total) by mouth 3 (three) times daily. Patient taking differently: Take 600 mg by mouth at bedtime. 01/12/17  Yes Barton Dubois, MD  lurasidone (LATUDA) 20 MG TABS tablet Take 20 mg by mouth daily. 03/24/21  Yes [provider]  methocarbamol (ROBAXIN) 750 MG tablet Take 750 mg by mouth every 8 (eight) hours as needed for muscle spasms. 05/07/20  Yes [provider]  mirtazapine (REMERON) 45 MG tablet Take 45 mg by mouth at bedtime.   Yes [provider]  naproxen sodium (ALEVE) 220 MG tablet Take 440 mg by mouth daily as needed (pain).   Yes [provider]  pantoprazole (PROTONIX) 40 MG tablet Take 1 tablet (40 mg total) by mouth 2 (two) times daily before a meal. Patient taking differently: Take 40 mg by mouth daily as needed (heartburn). 12/28/16  Yes Rai, Ripudeep K, MD  polyethylene glycol (MIRALAX / GLYCOLAX) packet Take 17 g by mouth daily. Patient not taking: No sig reported 05/23/18   Florencia Reasons, MD  senna-docusate (SENOKOT-S) 8.6-50 MG tablet Take 1 tablet by mouth at bedtime. Patient not taking: No sig reported 05/23/18   Florencia Reasons, MD  tamsulosin (FLOMAX) 0.4 MG CAPS capsule Take 1 capsule (0.4 mg  total) by mouth daily. Patient not taking: No sig reported 05/23/18   Florencia Reasons, MD  vitamin B-12 1000 MCG tablet Take 1 tablet (1,000 mcg total) by mouth daily. Patient not taking: No sig reported 05/23/18   Florencia Reasons, MD    Current Facility-Administered Medications  Medication Dose Route Frequency Provider Last Rate Last Admin   acetaminophen (TYLENOL) tablet 650 mg  650 mg Oral Q6H PRN Vernelle Emerald, MD   650 mg at 04/08/21 2349   Or   acetaminophen (TYLENOL) suppository 650 mg  650 mg Rectal Q6H PRN Shalhoub, Sherryll Burger, MD       amLODipine (NORVASC) tablet 10 mg  10 mg Oral Daily Shalhoub, Sherryll Burger, MD       atorvastatin (LIPITOR) tablet 40 mg  40 mg Oral QHS Vernelle Emerald, MD   40 mg at 04/08/21 2346   FLUoxetine (PROZAC) capsule 20 mg  20 mg Oral Daily Shalhoub, Sherryll Burger, MD       fluticasone (FLONASE) 50 MCG/ACT nasal spray 1 spray  1 spray Each Nare Daily PRN Shalhoub, Sherryll Burger, MD       gabapentin (NEURONTIN) capsule 600 mg  600 mg Oral QHS Shalhoub, Sherryll Burger, MD   600 mg at 04/08/21 2346   lactated ringers infusion   Intravenous Continuous Shalhoub, Sherryll Burger, MD 75 mL/hr at 04/09/21 0134 New Bag at 04/09/21 0134   lurasidone (LATUDA) tablet 20 mg  20 mg Oral Daily Shalhoub, Sherryll Burger, MD       methocarbamol (ROBAXIN) tablet 750 mg  750 mg Oral Q8H PRN Shalhoub, Sherryll Burger, MD  mirtazapine (REMERON) tablet 45 mg  45 mg Oral QHS Shalhoub, Deno Lunger, MD   45 mg at 04/08/21 2347   ondansetron (ZOFRAN) tablet 4 mg  4 mg Oral Q6H PRN Marinda Elk, MD       Or   ondansetron [facility]) injection 4 mg  4 mg Intravenous Q6H PRN Shalhoub, Deno Lunger, MD       pantoprazole (PROTONIX) injection 40 mg  40 mg Intravenous Q12H Shalhoub, Deno Lunger, MD       Current Outpatient Medications  Medication Sig Dispense Refill   acetaminophen (TYLENOL) 500 MG tablet Take 1,000 mg by mouth every 6 (six) hours as needed.     amLODipine (NORVASC) 10 MG tablet Take 10 mg by mouth daily.      Ascorbic Acid (VITAMIN C) 1000 MG tablet Take 1,000 mg by mouth daily.     aspirin EC 81 MG tablet Take 81 mg by mouth daily.     atorvastatin (LIPITOR) 40 MG tablet Take 1 tablet (40 mg total) by mouth daily at 6 PM. (Patient taking differently: Take 40 mg by mouth at bedtime.) 30 tablet 0   Carboxymethylcellulose Sodium (THERATEARS OP) Place 1 drop into both eyes 4 (four) times daily as needed (for dry eyes).      ferrous sulfate 325 (65 FE) MG tablet Take 1 tablet (325 mg total) by mouth every Monday, Wednesday, and Friday. 60 tablet 0   FLUoxetine (PROZAC) 20 MG capsule Take 20 mg by mouth daily.     fluticasone (FLONASE) 50 MCG/ACT nasal spray Place 1 spray into both nostrils daily as needed for allergies or rhinitis.     gabapentin (NEURONTIN) 300 MG capsule Take 1 capsule (300 mg total) by mouth 3 (three) times daily. (Patient taking differently: Take 600 mg by mouth at bedtime.) 90 capsule 0   lurasidone (LATUDA) 20 MG TABS tablet Take 20 mg by mouth daily.     methocarbamol (ROBAXIN) 750 MG tablet Take 750 mg by mouth every 8 (eight) hours as needed for muscle spasms.     mirtazapine (REMERON) 45 MG tablet Take 45 mg by mouth at bedtime.     naproxen sodium (ALEVE) 220 MG tablet Take 440 mg by mouth daily as needed (pain).     pantoprazole (PROTONIX) 40 MG tablet Take 1 tablet (40 mg total) by mouth 2 (two) times daily before a meal. (Patient taking differently: Take 40 mg by mouth daily as needed (heartburn).) 60 tablet 3   polyethylene glycol (MIRALAX / GLYCOLAX) packet Take 17 g by mouth daily. (Patient not taking: No sig reported) 14 each 0   senna-docusate (SENOKOT-S) 8.6-50 MG tablet Take 1 tablet by mouth at bedtime. (Patient not taking: No sig reported) 30 tablet 0   tamsulosin (FLOMAX) 0.4 MG CAPS capsule Take 1 capsule (0.4 mg total) by mouth daily. (Patient not taking: No sig reported) 30 capsule 0   vitamin B-12 1000 MCG tablet Take 1 tablet (1,000 mcg total) by mouth daily.  (Patient not taking: No sig reported) 30 tablet 0    Allergies as of 04/08/2021 - Review Complete 04/08/2021  Allergen Reaction Noted   Lisinopril  04/08/2021   Penicillins Other (See Comments) 11/19/2012    Family History  Problem Relation Age of Onset   Stroke Mother        Hemorrhagic stroke   Cancer Father        Pancreatic cancer   Hypertension Father    Pancreatic cancer Father  Colon cancer Neg Hx    Esophageal cancer Neg Hx    Liver cancer Neg Hx    Rectal cancer Neg Hx    Stomach cancer Neg Hx     Social History   Socioeconomic History   Marital status: Married    Spouse name: Not on file   Number of children: Not on file   Years of education: Not on file   Highest education level: Not on file  Occupational History   Not on file  Tobacco Use   Smoking status: Former    Packs/day: 1.50    Years: 30.00    Pack years: 45.00    Types: Cigarettes    Quit date: 03/06/2003    Years since quitting: 18.1   Smokeless tobacco: Never  Vaping Use   Vaping Use: Every day  Substance and Sexual Activity   Alcohol use: No    Alcohol/week: 0.0 standard drinks    Comment: drank years ago   Drug use: No   Sexual activity: Never  Other Topics Concern   Not on file  Social History Narrative   Not on file   Social Determinants of Health   Financial Resource Strain: Not on file  Food Insecurity: Not on file  Transportation Needs: Not on file  Physical Activity: Not on file  Stress: Not on file  Social Connections: Not on file  Intimate Partner Violence: Not on file    Review of Systems: All systems reviewed and negative except where noted in HPI.  OBJECTIVE    Physical Exam: Vital signs in last 24 hours: Temp:  [98 F (36.7 C)-99 F (37.2 C)] 98.2 F (36.8 C) (10/06 0130) Pulse Rate:  [89-117] 94 (10/06 0745) Resp:  [15-25] 20 (10/06 0745) BP: (104-146)/(53-94) 145/66 (10/06 0745) SpO2:  [94 %-100 %] 98 % (10/06 0745)   General:   Alert pale, thin  male in NAD Psych:  Pleasant, cooperative. Normal mood and affect. Eyes:  Pupils equal, sclera clear, no icterus.   Conjunctiva pink. Ears:  Normal auditory acuity. Nose:  No deformity, discharge,  or lesions. Neck:  Supple; no masses Lungs:  RLQ crackles, o/w CTA bilaterally Heart:  Regular rate and rhythm;  no lower extremity edema Abdomen:  Soft, non-distended, nontender, BS active, no palp mass   Rectal:  Deferred  Msk:  Symmetrical without gross deformities. . Neurologic:  Alert and  oriented x4;  grossly normal neurologically. Skin:  Intact without significant lesions or rashes.   Scheduled inpatient medications  amLODipine  10 mg Oral Daily   atorvastatin  40 mg Oral QHS   FLUoxetine  20 mg Oral Daily   gabapentin  600 mg Oral QHS   lurasidone  20 mg Oral Daily   mirtazapine  45 mg Oral QHS   pantoprazole (PROTONIX) IV  40 mg Intravenous Q12H      Intake/Output from previous day: No intake/output data recorded. Intake/Output this shift: No intake/output data recorded.   Lab Results: Recent Labs    04/08/21 1529 04/09/21 0316 04/09/21 0800  WBC 13.7* 12.2* 9.9  HGB 6.7* 7.9* 8.2*  HCT 23.1* 26.0* 25.9*  PLT 575* 469* 467*   BMET Recent Labs    04/08/21 1529 04/09/21 0316  NA 140 138  K 3.7 3.7  CL 102 103  CO2 27 26  GLUCOSE 92 101*  BUN 26* 17  CREATININE 1.36* 1.19  CALCIUM 9.4 8.9   LFT Recent Labs    04/09/21 0316  PROT 6.1*  ALBUMIN 2.8*  AST 22  ALT 19  ALKPHOS 141*  BILITOT 0.4   PT/INR Recent Labs    04/08/21 1529  LABPROT 12.8  INR 1.0   Hepatitis Panel No results for input(s): HEPBSAG, HCVAB, HEPAIGM, HEPBIGM in the last 72 hours.   . CBC Latest Ref Rng & Units 04/09/2021 04/09/2021 04/08/2021  WBC 4.0 - 10.5 K/uL 9.9 12.2(H) 13.7(H)  Hemoglobin 13.0 - 17.0 g/dL 8.2(L) 7.9(L) 6.7(LL)  Hematocrit 39.0 - 52.0 % 25.9(L) 26.0(L) 23.1(L)  Platelets 150 - 400 K/uL 467(H) 469(H) 575(H)    . CMP Latest Ref Rng & Units  04/09/2021 04/08/2021 05/23/2018  Glucose 70 - 99 mg/dL 101(H) 92 94  BUN 8 - 23 mg/dL 17 26(H) 10  Creatinine 0.61 - 1.24 mg/dL 1.19 1.36(H) 1.20  Sodium 135 - 145 mmol/L 138 140 144  Potassium 3.5 - 5.1 mmol/L 3.7 3.7 3.6  Chloride 98 - 111 mmol/L 103 102 112(H)  CO2 22 - 32 mmol/L $RemoveB'26 27 23  '[id number]$ Calcium 8.9 - 10.3 mg/dL 8.9 9.4 8.5(L)  Total Protein 6.5 - 8.1 g/dL 6.1(L) 7.1 -  Total Bilirubin 0.3 - 1.2 mg/dL 0.4 0.3 -  Alkaline Phos 38 - 126 U/L 141(H) 155(H) -  AST 15 - 41 U/L 22 26 -  ALT 0 - 44 U/L 19 20 -   Studies/Results: DG Chest 2 View  Result Date: 04/08/2021 CLINICAL DATA:  Shortness of breath EXAM: CHEST - 2 VIEW COMPARISON:  05/21/2018 FINDINGS: Cardiac and mediastinal contours are within normal limits. Somewhat low lung volumes. No focal pulmonary opacity. No pleural effusion or pneumothorax. No acute osseous abnormality. IMPRESSION: No acute cardiopulmonary process. Electronically Signed   By: Merilyn Baba M.D.   On: 04/08/2021 18:44   CT CHEST W CONTRAST  Result Date: 04/09/2021 CLINICAL DATA:  Decreased hemoglobin with shortness of breath EXAM: CT CHEST, ABDOMEN, AND PELVIS WITH CONTRAST TECHNIQUE: Multidetector CT imaging of the chest, abdomen and pelvis was performed following the standard protocol during bolus administration of intravenous contrast. CONTRAST:  160mL OMNIPAQUE IOHEXOL 350 MG/ML SOLN COMPARISON:  None. FINDINGS: CT CHEST FINDINGS Cardiovascular: Thoracic aorta demonstrates atherosclerotic calcifications without aneurysmal dilatation or dissection. Coronary calcifications are seen. No cardiac enlargement is noted. No large central pulmonary embolus is seen. Mediastinum/Nodes: Thoracic inlet is within normal limits. No sizable hilar or mediastinal adenopathy is noted. No axillary adenopathy is seen. The esophagus as visualized is within normal limits. Lungs/Pleura: Lungs are well aerated bilaterally. Mild linear scarring is noted in the bases bilaterally. Stable  nodular density in the right upper lobe is seen measuring 10 mm unchanged from 2018 and consistent with a benign area of scarring. No other focal nodules are seen. The right basilar nodule seen previously has resolved in the interval. Musculoskeletal: Degenerative changes of the thoracic spine are noted. No acute bony abnormality is seen. CT ABDOMEN PELVIS FINDINGS Hepatobiliary: Liver and gallbladder are within normal limits. Pancreas: Unremarkable. No pancreatic ductal dilatation or surrounding inflammatory changes. Spleen: Normal in size without focal abnormality. Adrenals/Urinary Tract: Adrenal glands demonstrate a left adrenal lesion consistent with adenoma stable from prior exam in 2018. It measures up to 16 mm. Kidneys demonstrate a normal enhancement pattern bilaterally. Normal excretion is noted on delayed images. No calculi or obstructive changes are seen. The bladder is partially distended. Stomach/Bowel: Scattered diverticular change of the colon is noted. No evidence of diverticulitis is seen. No obstructive changes are noted. Appendix is air-filled and within normal limits. Small bowel and stomach  appear within normal limits. Vascular/Lymphatic: Aortic atherosclerosis. No enlarged abdominal or pelvic lymph nodes. Reproductive: Prostate is been surgically removed. Multiple surgical staples are seen. Other: No abdominal wall hernia or abnormality. No abdominopelvic ascites. Musculoskeletal: Degenerative changes of lumbar spine are seen. IMPRESSION: CT of the chest: Stable nodular density in the right upper lobe consistent with benign scarring unchanged from 2018. No acute abnormality in the chest is noted. CT of the abdomen and pelvis: Stable left adrenal lesion. Diverticulosis without diverticulitis. Changes consistent with prior prostatectomy consistent with the given clinical history. No other focal abnormality is noted. Electronically Signed   By: Inez Catalina M.D.   On: 04/09/2021 00:41   CT  ABDOMEN PELVIS W CONTRAST  Result Date: 04/09/2021 CLINICAL DATA:  Decreased hemoglobin with shortness of breath EXAM: CT CHEST, ABDOMEN, AND PELVIS WITH CONTRAST TECHNIQUE: Multidetector CT imaging of the chest, abdomen and pelvis was performed following the standard protocol during bolus administration of intravenous contrast. CONTRAST:  132mL OMNIPAQUE IOHEXOL 350 MG/ML SOLN COMPARISON:  None. FINDINGS: CT CHEST FINDINGS Cardiovascular: Thoracic aorta demonstrates atherosclerotic calcifications without aneurysmal dilatation or dissection. Coronary calcifications are seen. No cardiac enlargement is noted. No large central pulmonary embolus is seen. Mediastinum/Nodes: Thoracic inlet is within normal limits. No sizable hilar or mediastinal adenopathy is noted. No axillary adenopathy is seen. The esophagus as visualized is within normal limits. Lungs/Pleura: Lungs are well aerated bilaterally. Mild linear scarring is noted in the bases bilaterally. Stable nodular density in the right upper lobe is seen measuring 10 mm unchanged from 2018 and consistent with a benign area of scarring. No other focal nodules are seen. The right basilar nodule seen previously has resolved in the interval. Musculoskeletal: Degenerative changes of the thoracic spine are noted. No acute bony abnormality is seen. CT ABDOMEN PELVIS FINDINGS Hepatobiliary: Liver and gallbladder are within normal limits. Pancreas: Unremarkable. No pancreatic ductal dilatation or surrounding inflammatory changes. Spleen: Normal in size without focal abnormality. Adrenals/Urinary Tract: Adrenal glands demonstrate a left adrenal lesion consistent with adenoma stable from prior exam in 2018. It measures up to 16 mm. Kidneys demonstrate a normal enhancement pattern bilaterally. Normal excretion is noted on delayed images. No calculi or obstructive changes are seen. The bladder is partially distended. Stomach/Bowel: Scattered diverticular change of the colon is  noted. No evidence of diverticulitis is seen. No obstructive changes are noted. Appendix is air-filled and within normal limits. Small bowel and stomach appear within normal limits. Vascular/Lymphatic: Aortic atherosclerosis. No enlarged abdominal or pelvic lymph nodes. Reproductive: Prostate is been surgically removed. Multiple surgical staples are seen. Other: No abdominal wall hernia or abnormality. No abdominopelvic ascites. Musculoskeletal: Degenerative changes of lumbar spine are seen. IMPRESSION: CT of the chest: Stable nodular density in the right upper lobe consistent with benign scarring unchanged from 2018. No acute abnormality in the chest is noted. CT of the abdomen and pelvis: Stable left adrenal lesion. Diverticulosis without diverticulitis. Changes consistent with prior prostatectomy consistent with the given clinical history. No other focal abnormality is noted. Electronically Signed   By: Inez Catalina M.D.   On: 04/09/2021 00:41    Principal Problem:   Iron deficiency anemia due to chronic blood loss Active Problems:   Bipolar 1 disorder ([facility])   Essential hypertension   GERD without esophagitis   Mixed hyperlipidemia   Chronic kidney disease, stage 3a ([facility])   Unexplained weight loss    Jason Savoy, NP-C @  04/09/2021, 9:18 AM   I have taken a history,  reviewed the chart and examined the patient. I performed a substantive portion of this encounter, including complete performance of at least one of the key components, in conjunction with the APP. I agree with the APP's note, impression and recommendations  75 year old male with history of GI bleed from gastrointestinal AVMs in 2017, with symptomatic iron deficiency anemia for several weeks without overt GI blood loss.  Hgb 6.7 on presentation, down from 9 in August.  Heme negative, hemodynamically stable.  No GI symptoms such as abdominal pain, constipation, diarrhea, nausea or vomiting. He has not eaten in over a day.  Suspect  he bled from AVMs again.  Will plan for push enteroscopy this afternoon.  -Keep NPO until procedure  Abriel Hattery E. Candis Schatz, MD [facility]

## 2021-04-10 ENCOUNTER — Encounter (HOSPITAL_COMMUNITY): Payer: Self-pay | Admitting: Gastroenterology

## 2021-04-10 DIAGNOSIS — K5521 Angiodysplasia of colon with hemorrhage: Principal | ICD-10-CM

## 2021-04-10 LAB — CBC WITH DIFFERENTIAL/PLATELET
Abs Immature Granulocytes: 0.09 10*3/uL — ABNORMAL HIGH (ref 0.00–0.07)
Basophils Absolute: 0.1 10*3/uL (ref 0.0–0.1)
Basophils Relative: 1 %
Eosinophils Absolute: 0.2 10*3/uL (ref 0.0–0.5)
Eosinophils Relative: 3 %
HCT: 24.4 % — ABNORMAL LOW (ref 39.0–52.0)
Hemoglobin: 7.6 g/dL — ABNORMAL LOW (ref 13.0–17.0)
Immature Granulocytes: 1 %
Lymphocytes Relative: 13 %
Lymphs Abs: 1.1 10*3/uL (ref 0.7–4.0)
MCH: 25.8 pg — ABNORMAL LOW (ref 26.0–34.0)
MCHC: 31.1 g/dL (ref 30.0–36.0)
MCV: 82.7 fL (ref 80.0–100.0)
Monocytes Absolute: 0.8 10*3/uL (ref 0.1–1.0)
Monocytes Relative: 10 %
Neutro Abs: 6.1 10*3/uL (ref 1.7–7.7)
Neutrophils Relative %: 72 %
Platelets: 392 10*3/uL (ref 150–400)
RBC: 2.95 MIL/uL — ABNORMAL LOW (ref 4.22–5.81)
RDW: 15.9 % — ABNORMAL HIGH (ref 11.5–15.5)
WBC: 8.3 10*3/uL (ref 4.0–10.5)
nRBC: 0 % (ref 0.0–0.2)

## 2021-04-10 LAB — COMPREHENSIVE METABOLIC PANEL
ALT: 17 U/L (ref 0–44)
AST: 20 U/L (ref 15–41)
Albumin: 2.5 g/dL — ABNORMAL LOW (ref 3.5–5.0)
Alkaline Phosphatase: 127 U/L — ABNORMAL HIGH (ref 38–126)
Anion gap: 10 (ref 5–15)
BUN: 13 mg/dL (ref 8–23)
CO2: 26 mmol/L (ref 22–32)
Calcium: 8.5 mg/dL — ABNORMAL LOW (ref 8.9–10.3)
Chloride: 103 mmol/L (ref 98–111)
Creatinine, Ser: 1.28 mg/dL — ABNORMAL HIGH (ref 0.61–1.24)
GFR, Estimated: 59 mL/min — ABNORMAL LOW (ref >60)
Glucose, Bld: 93 mg/dL (ref 70–99)
Potassium: 3.5 mmol/L (ref 3.5–5.1)
Sodium: 139 mmol/L (ref 135–145)
Total Bilirubin: 0.5 mg/dL (ref 0.3–1.2)
Total Protein: 5.8 g/dL — ABNORMAL LOW (ref 6.5–8.1)

## 2021-04-10 LAB — PREPARE RBC (CROSSMATCH)

## 2021-04-10 LAB — HEMOGLOBIN AND HEMATOCRIT, BLOOD
HCT: 24.4 % — ABNORMAL LOW (ref 39.0–52.0)
HCT: 23.8 % — ABNORMAL LOW (ref 39.0–52.0)
Hemoglobin: 7.5 g/dL — ABNORMAL LOW (ref 13.0–17.0)
Hemoglobin: 7.3 g/dL — ABNORMAL LOW (ref 13.0–17.0)

## 2021-04-10 LAB — MAGNESIUM: Magnesium: 1.7 mg/dL (ref 1.7–2.4)

## 2021-04-10 LAB — PHOSPHORUS: Phosphorus: 3.1 mg/dL (ref 2.5–4.6)

## 2021-04-10 MED ORDER — SUCRALFATE 1 GM/10ML PO SUSP: 1.0000 g | Freq: Three times a day (TID) | ORAL | 0 refills | Status: AC

## 2021-04-10 MED ORDER — FERROUS SULFATE 325 (65 FE) MG PO TABS: 325.0000 mg | ORAL_TABLET | ORAL | 0 refills | Status: AC

## 2021-04-10 MED ORDER — ENSURE ENLIVE PO LIQD
237.0000 mL | Freq: Three times a day (TID) | ORAL | Status: DC
  Administered 2021-04-10 (×2): 237 mL via ORAL

## 2021-04-10 MED ORDER — SODIUM CHLORIDE 0.9 % IV SOLN
510.0000 mg | Freq: Once | INTRAVENOUS | Status: AC
  Administered 2021-04-10: 510 mg via INTRAVENOUS
  Filled 2021-04-10: qty 17

## 2021-04-10 MED ORDER — SODIUM CHLORIDE 0.9% IV SOLUTION: Freq: Once | INTRAVENOUS | Status: AC

## 2021-04-10 MED ORDER — PANTOPRAZOLE SODIUM 40 MG PO TBEC: 40.0000 mg | DELAYED_RELEASE_TABLET | Freq: Two times a day (BID) | ORAL | 1 refills | Status: AC

## 2021-04-10 NOTE — Progress Notes (Signed)
PROGRESS NOTE    Jason [name]  YIR:[id number] DOB: 1945/11/16 DOA: 04/08/2021 PCP: [facility]   Chief Complaint  Patient presents with   Abnormal Lab    Brief Narrative:  75 year old male with past medical history of previous gastrointestinal bleeding (2017), bipolar disorder, chronic kidney disease stage IIIa, hyperlipidemia, hypertension, gastroesophageal reflux disease, prostate cancer (S/P prostatectomy done in [place]), carotid stenosis, previous stroke who presents to [facility] emergency department [facility] after being incidentally found to have Jason [name] hemoglobin of 6.  Assessment & Plan:   Principal Problem:   Iron deficiency anemia due to chronic blood loss Active Problems:   Bipolar 1 disorder (HCC)   Essential hypertension   GERD without esophagitis   Mixed hyperlipidemia   Symptomatic anemia   Chronic kidney disease, stage 3a (HCC)   Unexplained weight loss   AVM (arteriovenous malformation) of stomach, acquired with hemorrhage   AVM (arteriovenous malformation) of small bowel, acquired with hemorrhage  * Iron deficiency anemia due to chronic blood loss Patient presenting with notable significant anemia with hemoglobin of 6.7, compared to hemoglobin of 9.3 at the [place] on 8/23. Of note, patient has Jason [name] history of melena in 2017.  Patient underwent Jason [name] significant endoscopic work-up by Dr. Havery [name] with eventual identification of 2 AVMs in the small bowel via capsule endoscopy that were felt to possibly be the source of the patient's bleeding. Now s/p small bowel endoscopy with Jason [name] few non bleeding angiodysplastic lesions in the stomach and duodenum and jejunum.  Treated with APC.  Clip placed.  10-12 lesions treated.   Gi recommending PPI BID x 8 weeks, sucralfate 1 gram BID x 2 weeks S/p 1 unit pRBC. Hb downtrending today.  Repeat 1 unit pRBC and follow again in AM.   Unexplained weight loss Patient reports an  approximate 50 pound unintentional weight loss over the past several months with decreasing oral intake Patient has Jason [name] known history of prostate cancer status post prostatectomy Endoscopic work-up performed in 2017 revealed no evidence of malignancy Physical exam did not reveal obvious evidence of malignancy CT chest abdomen/pelvis notable for stable nodular density in RUL c/w benign scarring, stable L adrenal lesion, diverticulosis without diverticulitis   Chronic kidney disease, stage 3a (HCC) Strict intake and output monitoring Creatinine near baseline Minimizing nephrotoxic agents as much as possible Serial chemistries to monitor renal function and electrolytes   Essential hypertension Amlodipine   Bipolar 1 disorder ([place]) Latuda, prozac   Mixed hyperlipidemia Continue home regimen of statin therapy   GERD without esophagitis Patient has been placed on intravenous proton pump inhibitor as noted above.  DVT prophylaxis: SCD Code Status: full  Family Communication: none at bedside Disposition:   Status is: Observation  The patient will require care spanning > 2 midnights and should be moved to inpatient because: Inpatient level of care appropriate due to severity of illness  Dispo: The patient is from: Home              Anticipated d/c is to: Home              Patient currently is not medically stable to d/c.   Difficult to place patient No       Consultants:  GI  Procedures:  Impression - Normal esophagus. - Jason [name] few non-bleeding angiodysplastic lesions in the stomach. Treated with argon plasma coagulation (APC). - Multiple non-bleeding angiodysplastic lesions in the duodenum. Treated with argon plasma coagulation (APC). -  Multiple non-bleeding angiodysplastic lesions in the duodenum. Treated with argon plasma coagulation (APC). Clip (MR conditional) was placed. - No specimens collected. Jason [name] total of about 10-12 lesions were treated Recommendations - Return  patient to hospital ward for observation. - Resume regular diet. - Use Protonix (pantoprazole) 40 mg PO BID for 8 weeks. - Use sucralfate suspension 1 gram PO BID for 2 weeks. - If no evidence of further bleeding overnight, can go home tomorrow. - Recommend follow up with Primary  Antimicrobials:  Anti-infectives (From admission, onward)    None      Subjective: No complaints Didn't want to stay, but ultimately agreeable  Objective: Vitals:   04/10/21 0438 04/10/21 0900 04/10/21 1300 04/10/21 1408  BP: 136/73 117/71 120/64 125/63  Pulse: 90 90 90 86  Resp: 16 16 16 16   Temp: 98.2 F (36.8 C)     TempSrc:      SpO2: 98%   97%  Weight:      Height:        Intake/Output Summary (Last 24 hours) at 04/10/2021 1622 Last data filed at 04/10/2021 1549 Gross per 24 hour  Intake 100 ml  Output 550 ml  Net -450 ml   Filed Weights   04/09/21 1322  Weight: 65.3 kg    Examination:  General: No acute distress. Cardiovascular: RRR Lungs: unlabored Abdomen: Soft, nontender, nondistended  Neurological: Alert and oriented 3. Moves all extremities 4 . Cranial nerves II through XII grossly intact. Skin: Warm and dry. No rashes or lesions. Extremities: No clubbing or cyanosis. No edema.     Data Reviewed: I have personally reviewed following labs and imaging studies  CBC: Recent Labs  Lab 04/08/21 1529 04/09/21 0316 04/09/21 0800 04/09/21 1758 04/10/21 0335 04/10/21 0938 04/10/21 1507  WBC 13.7* 12.2* 9.9 10.0 8.3  --   --   NEUTROABS 10.1*  --   --   --  6.1  --   --   HGB 6.7* 7.9* 8.2* 8.2* 7.6* 7.5* 7.3*  HCT 23.1* 26.0* 25.9* 26.8* 24.4* 24.4* 23.8*  MCV 85.9 85.5 83.3 84.3 82.7  --   --   PLT 575* 469* 467* 445* 392  --   --     Basic Metabolic Panel: Recent Labs  Lab 04/08/21 1529 04/09/21 0316 04/10/21 0335  NA 140 138 139  K 3.7 3.7 3.5  CL 102 103 103  CO2 27 26 26   GLUCOSE 92 101* 93  BUN 26* 17 13  CREATININE 1.36* 1.19 1.28*  CALCIUM 9.4  8.9 8.5*  MG  --  2.0 1.7  PHOS  --   --  3.1    GFR: Estimated Creatinine Clearance: 42.4 mL/min (Jason [name]) (by C-G formula based on SCr of 1.28 mg/dL (H)).  Liver Function Tests: Recent Labs  Lab 04/08/21 1529 04/09/21 0316 04/10/21 0335  AST 26 22 20   ALT 20 19 17   ALKPHOS 155* 141* 127*  BILITOT 0.3 0.4 0.5  PROT 7.1 6.1* 5.8*  ALBUMIN 3.3* 2.8* 2.5*    CBG: No results for input(s): GLUCAP in the last 168 hours.   Recent Results (from the past 240 hour(s))  Resp Panel by RT-PCR (Flu Deronda Christian&B, Covid) Nasopharyngeal Swab     Status: None   Collection Time: 04/08/21  7:26 PM   Specimen: Nasopharyngeal Swab; Nasopharyngeal(NP) swabs in vial transport medium  Result Value Ref Range Status   SARS Coronavirus 2 by RT PCR NEGATIVE NEGATIVE Final    Comment: (NOTE) SARS-CoV-2 target nucleic  acids are NOT DETECTED.  The SARS-CoV-2 RNA is generally detectable in upper respiratory specimens during the acute phase of infection. The lowest concentration of SARS-CoV-2 viral copies this assay can detect is 138 copies/mL. Mikesha Migliaccio negative result does not preclude SARS-Cov-2 infection and should not be used as the sole basis for treatment or other patient management decisions. Ricahrd Schwager negative result may occur with  improper specimen collection/handling, submission of specimen other than nasopharyngeal swab, presence of viral mutation(s) within the areas targeted by this assay, and inadequate number of viral copies(<138 copies/mL). Kamiya Acord negative result must be combined with clinical observations, patient history, and epidemiological information. The expected result is Negative.  Fact Sheet for Patients:  EntrepreneurPulse.com.au  Fact Sheet for Healthcare Providers:  IncredibleEmployment.be  This test is no t yet approved or cleared by the [place] FDA and  has been authorized for detection and/or diagnosis of SARS-CoV-2 by FDA under an Emergency Use  Authorization (EUA). This EUA will remain  in effect (meaning this test can be used) for the duration of the COVID-19 declaration under Section 564(b)(1) of the Act, 21 U.S.C.section 360bbb-3(b)(1), unless the authorization is terminated  or revoked sooner.       Influenza Burlin Mcnair by PCR NEGATIVE NEGATIVE Final   Influenza B by PCR NEGATIVE NEGATIVE Final    Comment: (NOTE) The Xpert Xpress SARS-CoV-2/FLU/RSV plus assay is intended as an aid in the diagnosis of influenza from Nasopharyngeal swab specimens and should not be used as Travez Stancil sole basis for treatment. Nasal washings and aspirates are unacceptable for Xpert Xpress SARS-CoV-2/FLU/RSV testing.  Fact Sheet for Patients: EntrepreneurPulse.com.au  Fact Sheet for Healthcare Providers: IncredibleEmployment.be  This test is not yet approved or cleared by the [place] FDA and has been authorized for detection and/or diagnosis of SARS-CoV-2 by FDA under an Emergency Use Authorization (EUA). This EUA will remain in effect (meaning this test can be used) for the duration of the COVID-19 declaration under Section 564(b)(1) of the Act, 21 U.S.C. section 360bbb-3(b)(1), unless the authorization is terminated or revoked.  Performed at [facility], [place] [address] [place],  [address]          Radiology Studies: DG Chest 2 View  Result Date: 04/08/2021 CLINICAL DATA:  Shortness of breath EXAM: CHEST - 2 VIEW COMPARISON:  05/21/2018 FINDINGS: Cardiac and mediastinal contours are within normal limits. Somewhat low lung volumes. No focal pulmonary opacity. No pleural effusion or pneumothorax. No acute osseous abnormality. IMPRESSION: No acute cardiopulmonary process. Electronically Signed   By: Merilyn Baba M.D.   On: 04/08/2021 18:44   CT CHEST W CONTRAST  Result Date: 04/09/2021 CLINICAL DATA:  Decreased hemoglobin with shortness of breath EXAM: CT CHEST, ABDOMEN, AND PELVIS WITH  CONTRAST TECHNIQUE: Multidetector CT imaging of the chest, abdomen and pelvis was performed following the standard protocol during bolus administration of intravenous contrast. CONTRAST:  141mL OMNIPAQUE IOHEXOL 350 MG/ML SOLN COMPARISON:  None. FINDINGS: CT CHEST FINDINGS Cardiovascular: Thoracic aorta demonstrates atherosclerotic calcifications without aneurysmal dilatation or dissection. Coronary calcifications are seen. No cardiac enlargement is noted. No large central pulmonary embolus is seen. Mediastinum/Nodes: Thoracic inlet is within normal limits. No sizable hilar or mediastinal adenopathy is noted. No axillary adenopathy is seen. The esophagus as visualized is within normal limits. Lungs/Pleura: Lungs are well aerated bilaterally. Mild linear scarring is noted in the bases bilaterally. Stable nodular density in the right upper lobe is seen measuring 10 mm unchanged from 2018 and consistent with Marlisha Vanwyk benign area  of scarring. No other focal nodules are seen. The right basilar nodule seen previously has resolved in the interval. Musculoskeletal: Degenerative changes of the thoracic spine are noted. No acute bony abnormality is seen. CT ABDOMEN PELVIS FINDINGS Hepatobiliary: Liver and gallbladder are within normal limits. Pancreas: Unremarkable. No pancreatic ductal dilatation or surrounding inflammatory changes. Spleen: Normal in size without focal abnormality. Adrenals/Urinary Tract: Adrenal glands demonstrate Oriya Kettering left adrenal lesion consistent with adenoma stable from prior exam in 2018. It measures up to 16 mm. Kidneys demonstrate Aidan Caloca normal enhancement pattern bilaterally. Normal excretion is noted on delayed images. No calculi or obstructive changes are seen. The bladder is partially distended. Stomach/Bowel: Scattered diverticular change of the colon is noted. No evidence of diverticulitis is seen. No obstructive changes are noted. Appendix is air-filled and within normal limits. Small bowel and stomach  appear within normal limits. Vascular/Lymphatic: Aortic atherosclerosis. No enlarged abdominal or pelvic lymph nodes. Reproductive: Prostate is been surgically removed. Multiple surgical staples are seen. Other: No abdominal wall hernia or abnormality. No abdominopelvic ascites. Musculoskeletal: Degenerative changes of lumbar spine are seen. IMPRESSION: CT of the chest: Stable nodular density in the right upper lobe consistent with benign scarring unchanged from 2018. No acute abnormality in the chest is noted. CT of the abdomen and pelvis: Stable left adrenal lesion. Diverticulosis without diverticulitis. Changes consistent with prior prostatectomy consistent with the given clinical history. No other focal abnormality is noted. Electronically Signed   By: Inez Catalina M.D.   On: 04/09/2021 00:41   CT ABDOMEN PELVIS W CONTRAST  Result Date: 04/09/2021 CLINICAL DATA:  Decreased hemoglobin with shortness of breath EXAM: CT CHEST, ABDOMEN, AND PELVIS WITH CONTRAST TECHNIQUE: Multidetector CT imaging of the chest, abdomen and pelvis was performed following the standard protocol during bolus administration of intravenous contrast. CONTRAST:  1104mL OMNIPAQUE IOHEXOL 350 MG/ML SOLN COMPARISON:  None. FINDINGS: CT CHEST FINDINGS Cardiovascular: Thoracic aorta demonstrates atherosclerotic calcifications without aneurysmal dilatation or dissection. Coronary calcifications are seen. No cardiac enlargement is noted. No large central pulmonary embolus is seen. Mediastinum/Nodes: Thoracic inlet is within normal limits. No sizable hilar or mediastinal adenopathy is noted. No axillary adenopathy is seen. The esophagus as visualized is within normal limits. Lungs/Pleura: Lungs are well aerated bilaterally. Mild linear scarring is noted in the bases bilaterally. Stable nodular density in the right upper lobe is seen measuring 10 mm unchanged from 2018 and consistent with Lashe Oliveira benign area of scarring. No other focal nodules are seen.  The right basilar nodule seen previously has resolved in the interval. Musculoskeletal: Degenerative changes of the thoracic spine are noted. No acute bony abnormality is seen. CT ABDOMEN PELVIS FINDINGS Hepatobiliary: Liver and gallbladder are within normal limits. Pancreas: Unremarkable. No pancreatic ductal dilatation or surrounding inflammatory changes. Spleen: Normal in size without focal abnormality. Adrenals/Urinary Tract: Adrenal glands demonstrate Lainey Nelson left adrenal lesion consistent with adenoma stable from prior exam in 2018. It measures up to 16 mm. Kidneys demonstrate Jamaury Gumz normal enhancement pattern bilaterally. Normal excretion is noted on delayed images. No calculi or obstructive changes are seen. The bladder is partially distended. Stomach/Bowel: Scattered diverticular change of the colon is noted. No evidence of diverticulitis is seen. No obstructive changes are noted. Appendix is air-filled and within normal limits. Small bowel and stomach appear within normal limits. Vascular/Lymphatic: Aortic atherosclerosis. No enlarged abdominal or pelvic lymph nodes. Reproductive: Prostate is been surgically removed. Multiple surgical staples are seen. Other: No abdominal wall hernia or abnormality. No abdominopelvic ascites. Musculoskeletal: Degenerative changes  of lumbar spine are seen. IMPRESSION: CT of the chest: Stable nodular density in the right upper lobe consistent with benign scarring unchanged from 2018. No acute abnormality in the chest is noted. CT of the abdomen and pelvis: Stable left adrenal lesion. Diverticulosis without diverticulitis. Changes consistent with prior prostatectomy consistent with the given clinical history. No other focal abnormality is noted. Electronically Signed   By: Inez Catalina M.D.   On: 04/09/2021 00:41        Scheduled Meds:  sodium chloride   Intravenous Once   amLODipine  10 mg Oral Daily   atorvastatin  40 mg Oral QHS   feeding supplement  237 mL Oral TID BM    FLUoxetine  20 mg Oral Daily   gabapentin  600 mg Oral QHS   lurasidone  20 mg Oral Daily   mirtazapine  45 mg Oral QHS   pantoprazole  40 mg Oral BID   sucralfate  1 g Oral TID WC & HS   Continuous Infusions:     LOS: 1 day    Time spent: over 30 min    Fayrene Helper, MD [facility]   To contact the attending provider between 7A-7P or the covering provider during after hours 7P-7A, please log into the web site [email or web address] and access using universal [place] password for that web site. If you do not have the password, please call the hospital operator.  04/10/2021, 4:22 PM

## 2021-04-10 NOTE — Anesthesia Postprocedure Evaluation (Signed)
Anesthesia Post Note  Patient: Jason [name]  Procedure(s) Performed: ENTEROSCOPY HOT HEMOSTASIS (ARGON PLASMA COAGULATION/BICAP) HEMOSTASIS CLIP PLACEMENT     Patient location during evaluation: Endoscopy Anesthesia Type: MAC Level of consciousness: awake and alert Pain management: pain level controlled Vital Signs Assessment: post-procedure vital signs reviewed and stable Respiratory status: spontaneous breathing, nonlabored ventilation, respiratory function stable and patient connected to nasal cannula oxygen Cardiovascular status: blood pressure returned to baseline and stable Postop Assessment: no apparent nausea or vomiting Anesthetic complications: no   No notable events documented.  Last Vitals:  Vitals:   04/10/21 0438 04/10/21 0900  BP: 136/73 117/71  Pulse: 90 90  Resp: 16 16  Temp: 36.8 C   SpO2: 98%     Last Pain:  Vitals:   04/10/21 0811  TempSrc:   PainSc: 0-No pain                 Sibley Rolison L Khadar Monger

## 2021-04-10 NOTE — Progress Notes (Signed)
Initial Nutrition Assessment  DOCUMENTATION CODES:   Severe malnutrition in context of chronic illness  INTERVENTION:   Liberalize to regular diet to encourage PO intake  Ensure Enlive po TID, each supplement provides 350 kcal and 20 grams of protein  Provided education and handout on high calories, high protein food options to increase nutritional value of meals/snacks   NUTRITION DIAGNOSIS:   Severe Malnutrition related to chronic illness (chronic iron deficiency anemia) as evidenced by severe fat depletion, severe muscle depletion.   GOAL:   Patient will meet greater than or equal to 90% of their needs   MONITOR:   PO intake, Labs, Weight trends, I & O's  REASON FOR ASSESSMENT:   Malnutrition Screening Tool    ASSESSMENT:   75 yo male presents to the ED 10/5 from [place] [facility] d/t abnormal CBC lab. Admitted with iron deficiency anemia and multiple AVMs in the stomach, duodenum and jejunum. PMH includes GI bleed, CKD stage 3A, hyperlipidemia, HTN, GERD, prostate cancer, and stroke.   10/6 small bowel endoscopy: 10-12 non-bleeding angioplastic lesions in stomach, duodenum, and proximal jejunum treated with APC  Pt reports having a poor appetite x6 months and ate nothing x2 days PTA. He typically eats 2 meals per day and some snacks. His meals consist of eggs/egg sandwich for breakfast and dinner that his wife prepares. Doesn't eat many vegetables and enjoys spaghetti. He drinks 4-5 boost per day and would like to continue to receive them during admission. Spoke with MD about diet liberalization, MD in agreement.  Pt mentioned that he stopped taking all of his medications (including his iron) in January to try to determine which medication was making him feel sick.   He states that he uses a cane to get around but also that he has been feeling weak and has had to sit down between daily activities. With current poor appetite and weight loss, encouraged pt to  continue nutrition supplements after admission in order to provide proper nutrition to aid with weakness and preservation of muscle mass.  He reports a usual weight of around 190 lbs, endorses recent weight loss of about 50 lbs since January and a current weight around 140 lbs. Unfortunately, there is a limited record of weight history to verify weight loss. If accurate, this would be a 26% wt loss in 10 months,which is significant for time frame. States that his doctor was pleased with his weight loss. Spoke to patient regarding healthy weight loss and proper protein intake for maintaining muscle.   Provided handout and education on high protein, high calorie nutrition in order to increase calorie/protein intake and prevent additional weight loss.  Medications: remeron, protonix, carafate  Labs: Creatinine 1.28, hemoglobin 7.5  I/O's: 417mL since admission UOP: 453mL x24 hours  NUTRITION - FOCUSED PHYSICAL EXAM:  Flowsheet Row Most Recent Value  Orbital Region Severe depletion  Upper Arm Region Moderate depletion  Thoracic and Lumbar Region Moderate depletion  Buccal Region Severe depletion  Temple Region Severe depletion  Clavicle Bone Region Severe depletion  Clavicle and Acromion Bone Region Severe depletion  Scapular Bone Region Severe depletion  Dorsal Hand Severe depletion  Patellar Region Severe depletion  Anterior Thigh Region Severe depletion  Posterior Calf Region Moderate depletion  Edema (RD Assessment) None  Hair Reviewed  Eyes Reviewed  Mouth Reviewed  Skin Reviewed  Nails Reviewed       Diet Order:   Diet Order  Diet - low sodium heart healthy           Diet Heart Room service appropriate? Yes; Fluid consistency: Thin  Diet effective now                   EDUCATION NEEDS:   Education needs have been addressed  Skin:  Skin Assessment: Reviewed RN Assessment  Last BM:  04/08/21  Height:   Ht Readings from Last 1 Encounters:   04/09/21 5\' 4"  (1.626 m)    Weight:   Wt Readings from Last 1 Encounters:  04/09/21 65.3 kg    BMI:  Body mass index is 24.72 kg/m.  Estimated Nutritional Needs:   Kcal:  1700-1900  Protein:  85-95g  Fluid:  >1.7L  Clayborne Dana, RDN, LDN Clinical Nutrition

## 2021-04-10 NOTE — TOC Initial Note (Signed)
Transition of Care [facility]) - Initial/Assessment Note    Patient Details  Name: Jason [name] MRN: [id number] Date of Birth: 1945/12/18  Transition of Care [facility]) CM/SW Contact:    Tom-Johnson, Renea Ee, RN Phone Number: 04/10/2021, 5:20 PM  Clinical Narrative:                 CM spoke with patient at bedside. Has PMH of Acid reflux, AKI,  Anemia, Arthritis, Bipolar 1 disorder, Chronic back pain, Depression, Hearing loss, HLD HTN, Prostate cancer, PTSD, Stroke, TIA. Presented to ED  after being incidentally found to have a hemoglobin of 6 by [facility]. Patient states he lives with his wife and has a daughter and three grand children. Independent with care prior to hospitalization. 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 Ambulates with a cane and uses walker at times. Does not drink alcohol or smokes cigarettes but Vaps. Had small bowel enteroscopy for upper GI bleed with unexplained iron anemia deficiency. Recent Hgb trending downwards at 7.3. Possible blood transfusion. Medical work up continues. CM will continue to follow with needs.   Expected Discharge Plan: Home/Self Care Barriers to Discharge: Continued Medical Work up   Patient Goals and CMS Choice Patient states their goals for this hospitalization and ongoing recovery are:: To go home      Expected Discharge Plan and Services Expected Discharge Plan: Home/Self Care   Discharge Planning Services: CM Consult   Living arrangements for the past 2 months: Single Family Home                                      Prior Living Arrangements/Services Living arrangements for the past 2 months: Single Family Home Lives with:: Spouse Patient language and need for interpreter reviewed:: Yes Do you feel safe going back to the place where you live?: Yes      Need for Family Participation in Patient Care: Yes (Comment) Care giver support system in place?: Yes (comment)    Criminal Activity/Legal Involvement Pertinent to Current Situation/Hospitalization: No - Comment as needed  Activities of Daily Living Home Assistive Devices/Equipment: Cane (specify quad or straight), Eyeglasses (straight) ADL Screening (condition at time of admission) Patient's cognitive ability adequate to safely complete daily activities?: Yes Is the patient deaf or have difficulty hearing?: No Does the patient have difficulty seeing, even when wearing glasses/contacts?: No Does the patient have difficulty concentrating, remembering, or making decisions?: No Patient able to express need for assistance with ADLs?: Yes Does the patient have difficulty dressing or bathing?: No Independently performs ADLs?: Yes (appropriate for developmental age) Does the patient have difficulty walking or climbing stairs?: No Weakness of Legs: Both Weakness of Arms/Hands: None  Permission Sought/Granted Permission sought to share information with : Case Manager, Customer service manager Permission granted to share information with : Yes, Verbal Permission Granted              Emotional Assessment Appearance:: Appears stated age Attitude/Demeanor/Rapport: Engaged Affect (typically observed): Accepting, Appropriate, Hopeful Orientation: : Oriented to Self, Oriented to Place, Oriented to  Time, Oriented to Situation Alcohol / Substance Use: Other (comment) (Vaps) Psych Involvement: No (comment)  Admission diagnosis:  Anemia [D64.9] Symptomatic anemia [D64.9] Patient Active Problem List   Diagnosis Date Noted   AVM (arteriovenous malformation) of stomach, acquired with hemorrhage    AVM (arteriovenous malformation) of small bowel, acquired with  hemorrhage    Chronic kidney disease, stage 3a ([place]) 04/08/2021   Unexplained weight loss 04/08/2021   Acute renal failure superimposed on stage 3 chronic kidney disease ([place]) 05/20/2018   DOE (dyspnea on exertion) 01/17/2018   Solitary  pulmonary nodule on lung CT 01/17/2018   Adrenal incidentaloma ([place])    Ureteral calculus    Sepsis ([place]) 12/24/2016   Acute blood loss anemia    Melena    Iron deficiency anemia due to chronic blood loss 10/14/2015   Hyperglycemia 10/14/2015   Acid reflux disease with ulcer    Bleeding gastrointestinal    Symptomatic anemia    Upper GI bleeding    Carotid artery stenosis    Mixed hyperlipidemia 08/31/2015   CVA (cerebral infarction) 08/30/2015   Degeneration of lumbar or lumbosacral intervertebral disc 05/16/2014   Weakness 05/03/2014   Dysarthria 04/30/2014   AKI (acute kidney injury) ([place]) 04/30/2014   Hypotension 04/30/2014   Fall 04/30/2014   CVA (cerebral vascular accident) ([place]) 04/13/2014   Stroke ([place]) 04/13/2014   CAP (community acquired pneumonia) 02/13/2013   Musculoskeletal pain 02/13/2013   Hx of completed stroke 02/13/2013   Bipolar 1 disorder ([place]) 02/13/2013   Depression 02/13/2013   Essential hypertension 02/13/2013   H/O prostate cancer 02/13/2013   Nausea with vomiting 02/13/2013   GERD without esophagitis 02/13/2013   Poor balance 02/13/2013   PCP:  [facility]:   [place] [place] [address] [place] [address] Phone: [phone] Fax: [phone]     Social Determinants of Health (SDOH) Interventions    Readmission Risk Interventions No flowsheet data found.

## 2021-04-10 NOTE — Discharge Summary (Signed)
Physician Discharge Summary  Jason [name] KWI:[id number] DOB: 1946-04-24 DOA: 04/08/2021  PCP: [facility]  Admit date: 04/08/2021 Discharge date: 04/11/2021  Time spent: 40 minutes  Recommendations for Outpatient Follow-up:  Follow outpatient CBC/CMP Follow with GI outpatient Continue PPI, sucralfate per GI recs Follow weight loss outpatient, w/u further as appropriate Follow blood pressure outpt    He left AMA this morning.  Did not wait for labs to be drawn.  I called him, but he said he was not coming back.  Upset because our plan was for labs this morning and possible early d/c, but they haven't been done yet.  We discussed the importance of repeat labs prior to his discharge, but he was unwilling to stay (in fact when I called him he was walking out of the hospital).  I called his wife and discussed with her that he's left the hospital AMA.  I encouraged her to get outpatient labs for him, ideally as soon as possible.   Discussed his medications at the pharmacy (protonix, sucralfate, and to stop NSAIDS).  Discharge Diagnoses:  Principal Problem:   Iron deficiency anemia due to chronic blood loss Active Problems:   Bipolar 1 disorder (HCC)   Essential hypertension   GERD without esophagitis   Mixed hyperlipidemia   Symptomatic anemia   Chronic kidney disease, stage 3a (HCC)   Unexplained weight loss   AVM (arteriovenous malformation) of stomach, acquired with hemorrhage   AVM (arteriovenous malformation) of small bowel, acquired with hemorrhage   Discharge Condition: stable  Diet recommendation: heart healthy  Filed Weights   04/09/21 1322 04/10/21 2037  Weight: 65.3 kg 66 kg    History of present illness:  75 year old male with past medical history of previous gastrointestinal bleeding (2017), bipolar disorder, chronic kidney disease stage IIIa, hyperlipidemia, hypertension, gastroesophageal reflux disease, prostate cancer (S/P prostatectomy done in  [place]), carotid stenosis, previous stroke who presents to [facility] emergency department the direction of his [place] primary care provider after being incidentally found to have Tuck Dulworth hemoglobin of 6.  He's been stable s/p 1 unit pRBC.   He's now s/p small bowel endoscopy with multiple AVMs which were treated.  Hb slowly downtrending on 10/7.  We transfused him 1 unit pRBC again on 10/7 with plan for repeat labs and possible discharge on 10/8, unfortunately, labs not done on 10/8 am, but he refused to wait and left AMA.  Plan for discharge with outpatient follow up on PPI BID and sucralfate.    See below for additional details.  Hospital Course:    * Iron deficiency anemia due to chronic blood loss Patient presenting with notable significant anemia with hemoglobin of 6.7, compared to hemoglobin of 9.3 at the [place] on 8/23. Of note, patient has [name] history of melena in 2017.  Patient underwent Bijon Mineer significant endoscopic work-up by Dr. 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 Now s/p small bowel endoscopy with Erisha Paugh few non bleeding angiodysplastic lesions in the stomach and duodenum and jejunum.  Treated with APC.  Clip placed.  10-12 lesions treated.   Gi recommending PPI BID x 8 weeks, sucralfate 1 gram BID x 2 weeks.  They're ok with him resuming aspirin. IV iron prior to discharge.  Continue iron at home. S/p 1 unit pRBC.  Hb 7.3 10/7.  Transfused additional unit pRBC.  Unfortunately, he left against medical advice  before labs could be drawn.     Unexplained weight loss Patient reports an approximate 50 pound unintentional weight loss over the past several months with decreasing oral intake Patient has Jason [name] known history of prostate cancer status post prostatectomy Endoscopic work-up performed in 2017 revealed no evidence of malignancy Physical exam did not reveal obvious evidence of  malignancy CT chest abdomen/pelvis notable for stable nodular density in RUL c/w benign scarring, stable L adrenal lesion, diverticulosis without diverticulitis Follow this outpatient   Chronic kidney disease, stage 3a (HCC) Strict intake and output monitoring Creatinine near baseline Minimizing nephrotoxic agents as much as possible Serial chemistries to monitor renal function and electrolytes   Essential hypertension Amlodipine Will d/c HCTZ at d/c - BP appropriate   Bipolar 1 disorder ([place]) Latuda, prozac   Mixed hyperlipidemia Continue home regimen of statin therapy   GERD without esophagitis Patient has been placed on intravenous proton pump inhibitor as noted above.  Procedures: Small bowel endoscopy Impression - Normal esophagus. - Inaya Gillham few non-bleeding angiodysplastic lesions in the stomach. Treated with argon plasma coagulation (APC). - Multiple non-bleeding angiodysplastic lesions in the duodenum. Treated with argon plasma coagulation (APC). - Multiple non-bleeding angiodysplastic lesions in the jejunum. Treated with argon plasma coagulation (APC). Clip (MR conditional) was placed. - No specimens collected. Lakely El[place]f total of about 10-12 lesions were treated Recommendations - Return patient to hospital ward for observation. - Resume regular diet. - Use Protonix (pantoprazole) 40 mg PO BID for 8 weeks. - Use sucralfate suspension 1 gram PO BID for 2 weeks. - If no evidence of further bleeding overnight, can go home tomorrow. - Recommend follow up with Primary care or primary gastroenterologist to recheck CBC as outpatient in 2 weeks.   Consultations: GI  Discharge Exam: Vitals:   04/10/21 2117 04/11/21 0439  BP: 136/77 127/74  Pulse: 87 89  Resp: 18 18  Temp: 97.8 F (36.6 C) 98 F (36.7 C)  SpO2: 98% 93%   Patient left AMA prior to being seen  Discharge Instructions   Discharge Instructions     Call MD for:  difficulty breathing, headache or visual  disturbances   Complete by: As directed    Call MD for:  extreme fatigue   Complete by: As directed    Call MD for:  hives   Complete by: As directed    Call MD for:  persistant dizziness or light-headedness   Complete by: As directed    Call MD for:  persistant nausea and vomiting   Complete by: As directed    Call MD for:  redness, tenderness, or signs of infection (pain, swelling, redness, odor or green/yellow discharge around incision site)   Complete by: As directed    Call MD for:  severe uncontrolled pain   Complete by: As directed    Call MD for:  temperature >100.4   Complete by: As directed    Diet - low sodium heart healthy   Complete by: As directed    Discharge instructions   Complete by: As directed    You were seen for chronic blood loss anemia.   You had several AVM's which were treated by gastroenterology.  Stop your naproxen.  Take protonix twice Latreece Mochizuki day for 2 months.  Continue sucralfate for 2 weeks.    Please follow up with gastroenterology outpatient.  You can resume your aspirin.  Follow up with your PCP for repeat labs within [place] week.   You need to follow up with your  PCP regarding your weight loss.  This will need additional workup.  The CT scans we did did not show an obvious cause.    Follow up the CT findings with your PCP outpatient.  Stop your HCTZ, follow up with your PCP outpatient your blood pressure.  Return for new, recurrent, or worsening symptoms.  Please ask your PCP to request records from this hospitalization so they know what was done and what the next steps will be.   Increase activity slowly   Complete by: As directed       Allergies as of 04/11/2021       Reactions   Lisinopril    sweating   Penicillins Other (See Comments)   Reaction:  Unknown  Has patient had Madison Direnzo PCN reaction causing immediate rash, facial/tongue/throat swelling, SOB or lightheadedness with hypotension: Unknown Has patient had Briella Hobday PCN reaction causing severe rash  involving mucus membranes or skin necrosis: Unknown Has patient had Nicol Herbig PCN reaction that required hospitalization: Unknown Has patient had Bexley Mclester PCN reaction occurring within the last 10 years: No If all of the above answers are "NO", then may proceed with Cephalosporin use.        Medication List     STOP taking these medications    hydrochlorothiazide 25 MG tablet Commonly known as: HYDRODIURIL   naproxen sodium 220 MG tablet Commonly known as: ALEVE   polyethylene glycol 17 g packet Commonly known as: MIRALAX / GLYCOLAX   senna-docusate 8.6-50 MG tablet Commonly known as: Senokot-S       TAKE these medications    acetaminophen 500 MG tablet Commonly known as: TYLENOL Take 1,000 mg by mouth every 6 (six) hours as needed.   amLODipine 10 MG tablet Commonly known as: NORVASC Take 10 mg by mouth daily.   aspirin EC 81 MG tablet Take 81 mg by mouth daily.   atorvastatin 40 MG tablet Commonly known as: LIPITOR Take 1 tablet (40 mg total) by mouth daily at 6 PM. What changed: when to take this   cyanocobalamin 1000 MCG tablet Take 1 tablet (1,000 mcg total) by mouth daily.   ferrous sulfate 325 (65 FE) MG tablet Take 1 tablet (325 mg total) by mouth every Monday, Wednesday, and Friday.   FLUoxetine 20 MG capsule Commonly known as: PROZAC Take 20 mg by mouth daily.   fluticasone 50 MCG/ACT nasal spray Commonly known as: FLONASE Place 1 spray into both nostrils daily as needed for allergies or rhinitis.   gabapentin 300 MG capsule Commonly known as: NEURONTIN Take 1 capsule (300 mg total) by mouth 3 (three) times daily. What changed:  how much to take when to take this   lurasidone 20 MG Tabs tablet Commonly known as: LATUDA Take 20 mg by mouth daily.   methocarbamol 750 MG tablet Commonly known as: ROBAXIN Take 750 mg by mouth every 8 (eight) hours as needed for muscle spasms.   mirtazapine 45 MG tablet Commonly known as: REMERON Take 45 mg by mouth  at bedtime.   pantoprazole 40 MG tablet Commonly known as: PROTONIX Take 1 tablet (40 mg total) by mouth 2 (two) times daily. What changed: when to take this   sucralfate 1 GM/10ML suspension Commonly known as: CARAFATE Take 10 mLs (1 g total) by mouth 4 (four) times daily -  with meals and at bedtime for 14 days.   tamsulosin 0.4 MG Caps capsule Commonly known as: FLOMAX Take 1 capsule (0.4 mg total) by mouth daily.   THERATEARS  OP Place 1 drop into both eyes 4 (four) times daily as needed (for dry eyes).   vitamin C 1000 MG tablet Take 1,000 mg by mouth daily.       Allergies  Allergen Reactions   Lisinopril     sweating   Penicillins Other (See Comments)    Reaction:  Unknown  Has patient had Raja Liska PCN reaction causing immediate rash, facial/tongue/throat swelling, SOB or lightheadedness with hypotension: Unknown Has patient had Branch Pacitti PCN reaction causing severe rash involving mucus membranes or skin necrosis: Unknown Has patient had Avalee Castrellon PCN reaction that required hospitalization: Unknown Has patient had Dellia Donnelly PCN reaction occurring within the last 10 years: No If all of the above answers are "NO", then may proceed with Cephalosporin use.      The results of significant diagnostics from this hospitalization (including imaging, microbiology, ancillary and laboratory) are listed below for reference.    Significant Diagnostic Studies: DG Chest 2 View  Result Date: 04/08/2021 CLINICAL DATA:  Shortness of breath EXAM: CHEST - 2 VIEW COMPARISON:  05/21/2018 FINDINGS: Cardiac and mediastinal contours are within normal limits. Somewhat low lung volumes. No focal pulmonary opacity. No pleural effusion or pneumothorax. No acute osseous abnormality. IMPRESSION: No acute cardiopulmonary process. Electronically Signed   By: Merilyn Baba M.D.   On: 04/08/2021 18:44   CT CHEST W CONTRAST  Result Date: 04/09/2021 CLINICAL DATA:  Decreased hemoglobin with shortness of breath EXAM: CT CHEST,  ABDOMEN, AND PELVIS WITH CONTRAST TECHNIQUE: Multidetector CT imaging of the chest, abdomen and pelvis was performed following the standard protocol during bolus administration of intravenous contrast. CONTRAST:  178mL OMNIPAQUE IOHEXOL 350 MG/ML SOLN COMPARISON:  None. FINDINGS: CT CHEST FINDINGS Cardiovascular: Thoracic aorta demonstrates atherosclerotic calcifications without aneurysmal dilatation or dissection. Coronary calcifications are seen. No cardiac enlargement is noted. No large central pulmonary embolus is seen. Mediastinum/Nodes: Thoracic inlet is within normal limits. No sizable hilar or mediastinal adenopathy is noted. No axillary adenopathy is seen. The esophagus as visualized is within normal limits. Lungs/Pleura: Lungs are well aerated bilaterally. Mild linear scarring is noted in the bases bilaterally. Stable nodular density in the right upper lobe is seen measuring 10 mm unchanged from 2018 and consistent with Allisson Schindel benign area of scarring. No other focal nodules are seen. The right basilar nodule seen previously has resolved in the interval. Musculoskeletal: Degenerative changes of the thoracic spine are noted. No acute bony abnormality is seen. CT ABDOMEN PELVIS FINDINGS Hepatobiliary: Liver and gallbladder are within normal limits. Pancreas: Unremarkable. No pancreatic ductal dilatation or surrounding inflammatory changes. Spleen: Normal in size without focal abnormality. Adrenals/Urinary Tract: Adrenal glands demonstrate Tabathia Knoche left adrenal lesion consistent with adenoma stable from prior exam in 2018. It measures up to 16 mm. Kidneys demonstrate Dashae Wilcher normal enhancement pattern bilaterally. Normal excretion is noted on delayed images. No calculi or obstructive changes are seen. The bladder is partially distended. Stomach/Bowel: Scattered diverticular change of the colon is noted. No evidence of diverticulitis is seen. No obstructive changes are noted. Appendix is air-filled and within normal limits.  Small bowel and stomach appear within normal limits. Vascular/Lymphatic: Aortic atherosclerosis. No enlarged abdominal or pelvic lymph nodes. Reproductive: Prostate is been surgically removed. Multiple surgical staples are seen. Other: No abdominal wall hernia or abnormality. No abdominopelvic ascites. Musculoskeletal: Degenerative changes of lumbar spine are seen. IMPRESSION: CT of the chest: Stable nodular density in the right upper lobe consistent with benign scarring unchanged from 2018. No acute abnormality in the chest is noted.  CT of the abdomen and pelvis: Stable left adrenal lesion. Diverticulosis without diverticulitis. Changes consistent with prior prostatectomy consistent with the given clinical history. No other focal abnormality is noted. Electronically Signed   By: Inez Catalina M.D.   On: 04/09/2021 00:41   CT ABDOMEN PELVIS W CONTRAST  Result Date: 04/09/2021 CLINICAL DATA:  Decreased hemoglobin with shortness of breath EXAM: CT CHEST, ABDOMEN, AND PELVIS WITH CONTRAST TECHNIQUE: Multidetector CT imaging of the chest, abdomen and pelvis was performed following the standard protocol during bolus administration of intravenous contrast. CONTRAST:  130mL OMNIPAQUE IOHEXOL 350 MG/ML SOLN COMPARISON:  None. FINDINGS: CT CHEST FINDINGS Cardiovascular: Thoracic aorta demonstrates atherosclerotic calcifications without aneurysmal dilatation or dissection. Coronary calcifications are seen. No cardiac enlargement is noted. No large central pulmonary embolus is seen. Mediastinum/Nodes: Thoracic inlet is within normal limits. No sizable hilar or mediastinal adenopathy is noted. No axillary adenopathy is seen. The esophagus as visualized is within normal limits. Lungs/Pleura: Lungs are well aerated bilaterally. Mild linear scarring is noted in the bases bilaterally. Stable nodular density in the right upper lobe is seen measuring 10 mm unchanged from 2018 and consistent with Kinsey Karch benign area of scarring. No other  focal nodules are seen. The right basilar nodule seen previously has resolved in the interval. Musculoskeletal: Degenerative changes of the thoracic spine are noted. No acute bony abnormality is seen. CT ABDOMEN PELVIS FINDINGS Hepatobiliary: Liver and gallbladder are within normal limits. Pancreas: Unremarkable. No pancreatic ductal dilatation or surrounding inflammatory changes. Spleen: Normal in size without focal abnormality. Adrenals/Urinary Tract: Adrenal glands demonstrate Zadia Uhde left adrenal lesion consistent with adenoma stable from prior exam in 2018. It measures up to 16 mm. Kidneys demonstrate Birdena Kingma normal enhancement pattern bilaterally. Normal excretion is noted on delayed images. No calculi or obstructive changes are seen. The bladder is partially distended. Stomach/Bowel: Scattered diverticular change of the colon is noted. No evidence of diverticulitis is seen. No obstructive changes are noted. Appendix is air-filled and within normal limits. Small bowel and stomach appear within normal limits. Vascular/Lymphatic: Aortic atherosclerosis. No enlarged abdominal or pelvic lymph nodes. Reproductive: Prostate is been surgically removed. Multiple surgical staples are seen. Other: No abdominal wall hernia or abnormality. No abdominopelvic ascites. Musculoskeletal: Degenerative changes of lumbar spine are seen. IMPRESSION: CT of the chest: Stable nodular density in the right upper lobe consistent with benign scarring unchanged from 2018. No acute abnormality in the chest is noted. CT of the abdomen and pelvis: Stable left adrenal lesion. Diverticulosis without diverticulitis. Changes consistent with prior prostatectomy consistent with the given clinical history. No other focal abnormality is noted. Electronically Signed   By: Inez Catalina M.D.   On: 04/09/2021 00:41    Microbiology: Recent Results (from the past 240 hour(s))  Resp Panel by RT-PCR (Flu Xue Low&B, Covid) Nasopharyngeal Swab     Status: None    Collection Time: 04/08/21  7:26 PM   Specimen: Nasopharyngeal Swab; Nasopharyngeal(NP) swabs in vial transport medium  Result Value Ref Range Status   SARS Coronavirus 2 by RT PCR NEGATIVE NEGATIVE Final    Comment: (NOTE) SARS-CoV-2 target nucleic acids are NOT DETECTED.  The SARS-CoV-2 RNA is generally detectable in upper respiratory specimens during the acute phase of infection. The lowest concentration of SARS-CoV-2 viral copies this assay can detect is 138 copies/mL. Parv Manthey negative result does not preclude SARS-Cov-2 infection and should not be used as the sole basis for treatment or other patient management decisions. Cala Kruckenberg negative result may occur with  improper  specimen collection/handling, submission of specimen other than nasopharyngeal swab, presence of viral mutation(s) within the areas targeted by this assay, and inadequate number of viral copies(<138 copies/mL). Nero Sawatzky negative result must be combined with clinical observations, patient history, and epidemiological information. The expected result is Negative.  Fact Sheet for Patients:  EntrepreneurPulse.com.au  Fact Sheet for Healthcare Providers:  IncredibleEmployment.be  This test is no t yet approved or cleared by the [place] FDA and  has been authorized for detection and/or diagnosis of SARS-CoV-2 by FDA under an Emergency Use Authorization (EUA). This EUA will remain  in effect (meaning this test can be used) for the duration of the COVID-19 declaration under Section 564(b)(1) of the Act, 21 U.S.C.section 360bbb-3(b)(1), unless the authorization is terminated  or revoked sooner.       Influenza Lydiah Pong by PCR NEGATIVE NEGATIVE Final   Influenza B by PCR NEGATIVE NEGATIVE Final    Comment: (NOTE) The Xpert Xpress SARS-CoV-2/FLU/RSV plus assay is intended as an aid in the diagnosis of influenza from Nasopharyngeal swab specimens and should not be used as Kamylle Axelson sole basis for treatment.  Nasal washings and aspirates are unacceptable for Xpert Xpress SARS-CoV-2/FLU/RSV testing.  Fact Sheet for Patients: EntrepreneurPulse.com.au  Fact Sheet for Healthcare Providers: IncredibleEmployment.be  This test is not yet approved or cleared by the [place] FDA and has been authorized for detection and/or diagnosis of SARS-CoV-2 by FDA under an Emergency Use Authorization (EUA). This EUA will remain in effect (meaning this test can be used) for the duration of the COVID-19 declaration under Section 564(b)(1) of the Act, 21 U.S.C. section 360bbb-3(b)(1), unless the authorization is terminated or revoked.  Performed at [facility], [place] [address] [place], [place] [address]      Labs: Basic Metabolic Panel: Recent Labs  Lab 04/08/21 1529 04/09/21 0316 04/10/21 0335  NA 140 138 139  K 3.7 3.7 3.5  CL 102 103 103  CO2 27 26 26   GLUCOSE 92 101* 93  BUN 26* 17 13  CREATININE 1.36* 1.19 1.28*  CALCIUM 9.4 8.9 8.5*  MG  --  2.0 1.7  PHOS  --   --  3.1   Liver Function Tests: Recent Labs  Lab 04/08/21 1529 04/09/21 0316 04/10/21 0335  AST 26 22 20   ALT 20 19 17   ALKPHOS 155* 141* 127*  BILITOT 0.3 0.4 0.5  PROT 7.1 6.1* 5.8*  ALBUMIN 3.3* 2.8* 2.5*   No results for input(s): LIPASE, AMYLASE in the last 168 hours. No results for input(s): AMMONIA in the last 168 hours. CBC: Recent Labs  Lab 04/08/21 1529 04/09/21 0316 04/09/21 0800 04/09/21 1758 04/10/21 0335 04/10/21 0938 04/10/21 1507  WBC 13.7* 12.2* 9.9 10.0 8.3  --   --   NEUTROABS 10.1*  --   --   --  6.1  --   --   HGB 6.7* 7.9* 8.2* 8.2* 7.6* 7.5* 7.3*  HCT 23.1* 26.0* 25.9* 26.8* 24.4* 24.4* 23.8*  MCV 85.9 85.5 83.3 84.3 82.7  --   --   PLT 575* 469* 467* 445* 392  --   --    Cardiac Enzymes: No results for input(s): CKTOTAL, CKMB, CKMBINDEX, TROPONINI in the last 168 hours. BNP: BNP (last 3 results) No results for input(s): BNP in the last  8760 hours.  ProBNP (last 3 results) No results for input(s): PROBNP in the last 8760 hours.  CBG: Recent Labs  Lab 04/11/21 0544  GLUCAP 104*  Signed:  Fayrene Helper MD.  [facility] 04/11/2021, 9:03 AM

## 2021-04-10 NOTE — Plan of Care (Signed)
  Problem: Nutrition: Goal: Adequate nutrition will be maintained 04/10/2021 0925 by Berneta Levins, RN Outcome: Progressing 04/09/2021 1948 by Berneta Levins, RN Outcome: Progressing   Problem: Pain Managment: Goal: General experience of comfort will improve Outcome: Progressing

## 2021-04-10 NOTE — Progress Notes (Signed)
[facility] ROUNDING NOTE   Subjective: No acute events overnight.  Patient slept well.  Tolerated regular diet.  Has not yet had a bowel movement.  Hgb drifted from 8.2 to 7.6. Would like to go home if possible   Objective: Vital signs in last 24 hours: Temp:  [97.3 F (36.3 C)-98.7 F (37.1 C)] 98.2 F (36.8 C) (10/07 0438) Pulse Rate:  [76-102] 90 (10/07 0900) Resp:  [14-22] 16 (10/07 0900) BP: (102-140)/(58-89) 117/71 (10/07 0900) SpO2:  [96 %-100 %] 98 % (10/07 0438) Weight:  [65.3 kg] 65.3 kg (10/06 1322) Last BM Date: 04/08/21 General: NAD, pleasant Caucasian male Lungs:  CTA b/l, no w/r/r Heart:  RRR, no m/r/g Abdomen:  Soft, NT, ND, +BS Ext:  No c/c/e    Intake/Output from previous day: 10/06 0701 - 10/07 0700 In: 1000 [I.V.:1000] Out: 400 [Urine:400] Intake/Output this shift: No intake/output data recorded.   Lab Results: Recent Labs    04/09/21 0800 04/09/21 1758 04/10/21 0335  WBC 9.9 10.0 8.3  HGB 8.2* 8.2* 7.6*  PLT 467* 445* 392  MCV 83.3 84.3 82.7   BMET Recent Labs    04/08/21 1529 04/09/21 0316 04/10/21 0335  NA 140 138 139  K 3.7 3.7 3.5  CL 102 103 103  CO2 27 26 26   GLUCOSE 92 101* 93  BUN 26* 17 13  CREATININE 1.36* 1.19 1.28*  CALCIUM 9.4 8.9 8.5*   LFT Recent Labs    04/08/21 1529 04/09/21 0316 04/10/21 0335  PROT 7.1 6.1* 5.8*  ALBUMIN 3.3* 2.8* 2.5*  AST 26 22 20   ALT 20 19 17   ALKPHOS 155* 141* 127*  BILITOT 0.3 0.4 0.5   PT/INR Recent Labs    04/08/21 1529  INR 1.0      Imaging/Other results: DG Chest 2 View  Result Date: 04/08/2021 CLINICAL DATA:  Shortness of breath EXAM: CHEST - 2 VIEW COMPARISON:  05/21/2018 FINDINGS: Cardiac and mediastinal contours are within normal limits. Somewhat low lung volumes. No focal pulmonary opacity. No pleural effusion or pneumothorax. No acute osseous abnormality. IMPRESSION: No acute cardiopulmonary process. Electronically Signed   By: Merilyn Baba M.D.    On: 04/08/2021 18:44   CT CHEST W CONTRAST  Result Date: 04/09/2021 CLINICAL DATA:  Decreased hemoglobin with shortness of breath EXAM: CT CHEST, ABDOMEN, AND PELVIS WITH CONTRAST TECHNIQUE: Multidetector CT imaging of the chest, abdomen and pelvis was performed following the standard protocol during bolus administration of intravenous contrast. CONTRAST:  137mL OMNIPAQUE IOHEXOL 350 MG/ML SOLN COMPARISON:  None. FINDINGS: CT CHEST FINDINGS Cardiovascular: Thoracic aorta demonstrates atherosclerotic calcifications without aneurysmal dilatation or dissection. Coronary calcifications are seen. No cardiac enlargement is noted. No large central pulmonary embolus is seen. Mediastinum/Nodes: Thoracic inlet is within normal limits. No sizable hilar or mediastinal adenopathy is noted. No axillary adenopathy is seen. The esophagus as visualized is within normal limits. Lungs/Pleura: Lungs are well aerated bilaterally. Mild linear scarring is noted in the bases bilaterally. Stable nodular density in the right upper lobe is seen measuring 10 mm unchanged from 2018 and consistent with a benign area of scarring. No other focal nodules are seen. The right basilar nodule seen previously has resolved in the interval. Musculoskeletal: Degenerative changes of the thoracic spine are noted. No acute bony abnormality is seen. CT ABDOMEN PELVIS FINDINGS Hepatobiliary: Liver and gallbladder are within normal limits. Pancreas: Unremarkable. No pancreatic ductal dilatation or surrounding inflammatory changes. Spleen: Normal in size without focal abnormality. Adrenals/Urinary Tract: Adrenal glands demonstrate  a left adrenal lesion consistent with adenoma stable from prior exam in 2018. It measures up to 16 mm. Kidneys demonstrate a normal enhancement pattern bilaterally. Normal excretion is noted on delayed images. No calculi or obstructive changes are seen. The bladder is partially distended. Stomach/Bowel: Scattered diverticular  change of the colon is noted. No evidence of diverticulitis is seen. No obstructive changes are noted. Appendix is air-filled and within normal limits. Small bowel and stomach appear within normal limits. Vascular/Lymphatic: Aortic atherosclerosis. No enlarged abdominal or pelvic lymph nodes. Reproductive: Prostate is been surgically removed. Multiple surgical staples are seen. Other: No abdominal wall hernia or abnormality. No abdominopelvic ascites. Musculoskeletal: Degenerative changes of lumbar spine are seen. IMPRESSION: CT of the chest: Stable nodular density in the right upper lobe consistent with benign scarring unchanged from 2018. No acute abnormality in the chest is noted. CT of the abdomen and pelvis: Stable left adrenal lesion. Diverticulosis without diverticulitis. Changes consistent with prior prostatectomy consistent with the given clinical history. No other focal abnormality is noted. Electronically Signed   By: Inez Catalina M.D.   On: 04/09/2021 00:41   CT ABDOMEN PELVIS W CONTRAST  Result Date: 04/09/2021 CLINICAL DATA:  Decreased hemoglobin with shortness of breath EXAM: CT CHEST, ABDOMEN, AND PELVIS WITH CONTRAST TECHNIQUE: Multidetector CT imaging of the chest, abdomen and pelvis was performed following the standard protocol during bolus administration of intravenous contrast. CONTRAST:  160mL OMNIPAQUE IOHEXOL 350 MG/ML SOLN COMPARISON:  None. FINDINGS: CT CHEST FINDINGS Cardiovascular: Thoracic aorta demonstrates atherosclerotic calcifications without aneurysmal dilatation or dissection. Coronary calcifications are seen. No cardiac enlargement is noted. No large central pulmonary embolus is seen. Mediastinum/Nodes: Thoracic inlet is within normal limits. No sizable hilar or mediastinal adenopathy is noted. No axillary adenopathy is seen. The esophagus as visualized is within normal limits. Lungs/Pleura: Lungs are well aerated bilaterally. Mild linear scarring is noted in the bases  bilaterally. Stable nodular density in the right upper lobe is seen measuring 10 mm unchanged from 2018 and consistent with a benign area of scarring. No other focal nodules are seen. The right basilar nodule seen previously has resolved in the interval. Musculoskeletal: Degenerative changes of the thoracic spine are noted. No acute bony abnormality is seen. CT ABDOMEN PELVIS FINDINGS Hepatobiliary: Liver and gallbladder are within normal limits. Pancreas: Unremarkable. No pancreatic ductal dilatation or surrounding inflammatory changes. Spleen: Normal in size without focal abnormality. Adrenals/Urinary Tract: Adrenal glands demonstrate a left adrenal lesion consistent with adenoma stable from prior exam in 2018. It measures up to 16 mm. Kidneys demonstrate a normal enhancement pattern bilaterally. Normal excretion is noted on delayed images. No calculi or obstructive changes are seen. The bladder is partially distended. Stomach/Bowel: Scattered diverticular change of the colon is noted. No evidence of diverticulitis is seen. No obstructive changes are noted. Appendix is air-filled and within normal limits. Small bowel and stomach appear within normal limits. Vascular/Lymphatic: Aortic atherosclerosis. No enlarged abdominal or pelvic lymph nodes. Reproductive: Prostate is been surgically removed. Multiple surgical staples are seen. Other: No abdominal wall hernia or abnormality. No abdominopelvic ascites. Musculoskeletal: Degenerative changes of lumbar spine are seen. IMPRESSION: CT of the chest: Stable nodular density in the right upper lobe consistent with benign scarring unchanged from 2018. No acute abnormality in the chest is noted. CT of the abdomen and pelvis: Stable left adrenal lesion. Diverticulosis without diverticulitis. Changes consistent with prior prostatectomy consistent with the given clinical history. No other focal abnormality is noted. Electronically Signed   By:  Inez Catalina M.D.   On:  04/09/2021 00:41      Assessment and Plan:  75 year old with history of gastrointestinal AVMs requriing admission in 2017, with recurrent symptomatic iron deficiency anemia, found to have numerous AVMs in the stomach, duodenum and jejunum, treated with APC yesterday.  He did well overnight.  His hemoglobin drifted down slightly this morning.  No overt GI bleeding.  He likely has more AVMs deeper in his small intestine and will be at risk for rebleeding  It is fortunate he has gone 5 years between bleeding episodes.   I recommend he stay on oral iron supplementation as an outpatient and he would likely benefit from some IV iron prior to discharge.  I think he is safe for discharge today, but would recommend repeating a CBC as an outpatient in 2 weeks.  Patient states he can get this done through his [place] provider.   Alternatively, monitoring his blood counts for another day would also be reasonable, but if he is bleeding deep in the small bowel, he would require a balloon enteroscopy, which we would not have the capability to do until next week.    Daryel November, MD  04/10/2021, 9:21 AM [facility]

## 2021-04-11 LAB — TYPE AND SCREEN
ABO/RH(D): O POS
Antibody Screen: NEGATIVE
Unit division: 0
Unit division: 0

## 2021-04-11 LAB — BPAM RBC
Blood Product Expiration Date: 202211022359
Blood Product Expiration Date: 202211022359
ISSUE DATE / TIME: 202210052100
ISSUE DATE / TIME: 202210071818
Unit Type and Rh: 5100
Unit Type and Rh: 5100

## 2021-04-11 LAB — GLUCOSE, CAPILLARY: Glucose-Capillary: 104 mg/dL — ABNORMAL HIGH (ref 70–99)

## 2021-04-11 NOTE — Progress Notes (Signed)
Went into the patient 's room for medication rounding,patient was anticipating to be discharged first thing in the morning.Nurse explained to patient that he needs to wait for a while due to his pending lab work.Patient went ballistic and said '' my doctor promised to me that he would discharging me at this time''.Nurse explained further but the patient not listening and pulled out his telemetry wiring from his chest and changed into street clothes. Nurse informed M.D. on call about the patient's frustration.When the nurse came back into patient's room ,patient is nowhere to be found.Nurse looked after the patient ,but he was already getting into the elevator,he left without signing AMA form.MD made aware.

## 2021-07-02 ENCOUNTER — Emergency Department (HOSPITAL_COMMUNITY)
Admission: EM | Admit: 2021-07-02 | Discharge: 2021-07-02 | Disposition: A | Discharge status: Home / Self Care | Payer: No Typology Code available for payment source | Attending: Emergency Medicine | Admitting: Emergency Medicine

## 2021-07-02 ENCOUNTER — Other Ambulatory Visit: Payer: Self-pay

## 2021-07-02 ENCOUNTER — Encounter (HOSPITAL_COMMUNITY): Payer: Self-pay

## 2021-07-02 DIAGNOSIS — E876 Hypokalemia: Secondary | ICD-10-CM | POA: Diagnosis not present

## 2021-07-02 DIAGNOSIS — Z79899 Other long term (current) drug therapy: Secondary | ICD-10-CM | POA: Diagnosis not present

## 2021-07-02 DIAGNOSIS — Z7982 Long term (current) use of aspirin: Secondary | ICD-10-CM | POA: Insufficient documentation

## 2021-07-02 DIAGNOSIS — R55 Syncope and collapse: Secondary | ICD-10-CM | POA: Insufficient documentation

## 2021-07-02 DIAGNOSIS — E860 Dehydration: Secondary | ICD-10-CM | POA: Insufficient documentation

## 2021-07-02 DIAGNOSIS — Z20822 Contact with and (suspected) exposure to covid-19: Secondary | ICD-10-CM | POA: Diagnosis not present

## 2021-07-02 DIAGNOSIS — Z8546 Personal history of malignant neoplasm of prostate: Secondary | ICD-10-CM | POA: Diagnosis not present

## 2021-07-02 DIAGNOSIS — Z87891 Personal history of nicotine dependence: Secondary | ICD-10-CM | POA: Diagnosis not present

## 2021-07-02 DIAGNOSIS — I129 Hypertensive chronic kidney disease with stage 1 through stage 4 chronic kidney disease, or unspecified chronic kidney disease: Secondary | ICD-10-CM | POA: Diagnosis not present

## 2021-07-02 DIAGNOSIS — N1831 Chronic kidney disease, stage 3a: Secondary | ICD-10-CM | POA: Insufficient documentation

## 2021-07-02 LAB — COMPREHENSIVE METABOLIC PANEL
ALT: 13 U/L (ref 0–44)
AST: 18 U/L (ref 15–41)
Albumin: 3.7 g/dL (ref 3.5–5.0)
Alkaline Phosphatase: 114 U/L (ref 38–126)
Anion gap: 10 (ref 5–15)
BUN: 16 mg/dL (ref 8–23)
CO2: 24 mmol/L (ref 22–32)
Calcium: 8.8 mg/dL — ABNORMAL LOW (ref 8.9–10.3)
Chloride: 104 mmol/L (ref 98–111)
Creatinine, Ser: 1.69 mg/dL — ABNORMAL HIGH (ref 0.61–1.24)
GFR, Estimated: 42 mL/min — ABNORMAL LOW (ref >60)
Glucose, Bld: 81 mg/dL (ref 70–99)
Potassium: 2.7 mmol/L — CL (ref 3.5–5.1)
Sodium: 138 mmol/L (ref 135–145)
Total Bilirubin: 0.4 mg/dL (ref 0.3–1.2)
Total Protein: 6.4 g/dL — ABNORMAL LOW (ref 6.5–8.1)

## 2021-07-02 LAB — CBC WITH DIFFERENTIAL/PLATELET
Abs Immature Granulocytes: 0.03 10*3/uL (ref 0.00–0.07)
Basophils Absolute: 0.1 10*3/uL (ref 0.0–0.1)
Basophils Relative: 1 %
Eosinophils Absolute: 0.1 10*3/uL (ref 0.0–0.5)
Eosinophils Relative: 2 %
HCT: 27.4 % — ABNORMAL LOW (ref 39.0–52.0)
Hemoglobin: 7.9 g/dL — ABNORMAL LOW (ref 13.0–17.0)
Immature Granulocytes: 0 %
Lymphocytes Relative: 19 %
Lymphs Abs: 1.3 10*3/uL (ref 0.7–4.0)
MCH: 23.2 pg — ABNORMAL LOW (ref 26.0–34.0)
MCHC: 28.8 g/dL — ABNORMAL LOW (ref 30.0–36.0)
MCV: 80.4 fL (ref 80.0–100.0)
Monocytes Absolute: 0.5 10*3/uL (ref 0.1–1.0)
Monocytes Relative: 8 %
Neutro Abs: 4.7 10*3/uL (ref 1.7–7.7)
Neutrophils Relative %: 70 %
Platelets: 316 10*3/uL (ref 150–400)
RBC: 3.41 MIL/uL — ABNORMAL LOW (ref 4.22–5.81)
RDW: 16.4 % — ABNORMAL HIGH (ref 11.5–15.5)
WBC: 6.7 10*3/uL (ref 4.0–10.5)
nRBC: 0 % (ref 0.0–0.2)

## 2021-07-02 LAB — RESP PANEL BY RT-PCR (FLU A&B, COVID) ARPGX2
Influenza A by PCR: NEGATIVE
Influenza B by PCR: NEGATIVE
SARS Coronavirus 2 by RT PCR: NEGATIVE

## 2021-07-02 LAB — MAGNESIUM: Magnesium: 1.7 mg/dL (ref 1.7–2.4)

## 2021-07-02 MED ORDER — LACTATED RINGERS IV BOLUS
1000.0000 mL | Freq: Once | INTRAVENOUS | Status: AC
  Administered 2021-07-02: 18:00:00 1000 mL via INTRAVENOUS

## 2021-07-02 MED ORDER — POTASSIUM CHLORIDE CRYS ER 20 MEQ PO TBCR
40.0000 meq | EXTENDED_RELEASE_TABLET | Freq: Once | ORAL | Status: AC
  Administered 2021-07-02: 19:00:00 40 meq via ORAL
  Filled 2021-07-02: qty 2

## 2021-07-02 MED ORDER — ACETAMINOPHEN 500 MG PO TABS
1000.0000 mg | ORAL_TABLET | Freq: Once | ORAL | Status: AC
  Administered 2021-07-02: 21:00:00 1000 mg via ORAL
  Filled 2021-07-02: qty 2

## 2021-07-02 MED ORDER — POTASSIUM CHLORIDE 10 MEQ/100ML IV SOLN
10.0000 meq | Freq: Once | INTRAVENOUS | Status: AC
  Administered 2021-07-02: 19:00:00 10 meq via INTRAVENOUS
  Filled 2021-07-02: qty 100

## 2021-07-02 MED ORDER — POTASSIUM CHLORIDE CRYS ER 20 MEQ PO TBCR: 20.0000 meq | EXTENDED_RELEASE_TABLET | Freq: Two times a day (BID) | ORAL | 0 refills | Status: AC

## 2021-07-02 NOTE — Discharge Instructions (Addendum)
STOP taking the HYDROCHLOROTHIAZIDE. Be sure to drink plenty of water/fluids. Make sure you are eating appropriately.  Follow-up with your doctor in the next week or so.  You need to get your potassium rechecked.   If at any point you develop recurrent dizziness or passing out, chest pain, shortness of breath, palpitations, weakness, or any other new/concerning symptoms then return to the ER for evaluation or call 911

## 2021-07-02 NOTE — ED Notes (Signed)
Patient verbalizes understanding of discharge instructions. Opportunity for questioning and answers were provided. Armband removed by staff, pt discharged from ED ambulatory.   

## 2021-07-02 NOTE — ED Provider Notes (Signed)
[facility] EMERGENCY DEPARTMENT Provider Note   CSN: [id number] Arrival date & time: 07/02/21  1701     History Chief Complaint  Patient presents with   Syncope    Jason [name] is a 75 y.o. male.  HPI 75 year old male presents with syncope.  The patient states that he has had a decreased appetite and not been eating well for the last couple days.  He otherwise denies acute illness besides chronic diffuse pain.  No recent fever, cough, sore throat, etc.  Today when he was in a store he was feeling progressively weak and lightheaded.  He was in line when he all of a sudden could not support himself anymore and fell to the ground.  Did not suffer any injury.  No thunderclap headache.  No chest pain, palpitations, etc.  He is feeling a little better now.  He had some hypotension when EMS was first there at 76/46.  Past Medical History:  Diagnosis Date   Acid reflux disease with ulcer    AKI (acute kidney injury) ([place])    Anemia    Arthritis    Bipolar 1 disorder ([place])    Blood transfusion without reported diagnosis    Carotid stenosis    Cataract    Chronic back pain    Complication of anesthesia 2000   reaction to anesthesia had some swelling    Depression    GERD (gastroesophageal reflux disease)    Hearing loss    Hyperlipidemia    Hypertension    Prostate cancer ([place])    prostate cancer   PTSD (post-traumatic stress disorder)    Stroke ([place]) 08/2015   Substance abuse ([place])    TIA (transient ischemic attack)    has had several mini strokes    Patient Active Problem List   Diagnosis Date Noted   AVM (arteriovenous malformation) of stomach, acquired with hemorrhage    AVM (arteriovenous malformation) of small bowel, acquired with hemorrhage    Chronic kidney disease, stage 3a ([place]) 04/08/2021   Unexplained weight loss 04/08/2021   Acute renal failure superimposed on stage 3 chronic kidney disease ([place]) 05/20/2018   DOE (dyspnea on exertion)  01/17/2018   Solitary pulmonary nodule on lung CT 01/17/2018   Adrenal incidentaloma ([facility])    Ureteral calculus    Sepsis ([place]) 12/24/2016   Acute blood loss anemia    Melena    Iron deficiency anemia due to chronic blood loss 10/14/2015   Hyperglycemia 10/14/2015   Acid reflux disease with ulcer    Bleeding gastrointestinal    Symptomatic anemia    Upper GI bleeding    Carotid artery stenosis    Mixed hyperlipidemia 08/31/2015   CVA (cerebral infarction) 08/30/2015   Degeneration of lumbar or lumbosacral intervertebral disc 05/16/2014   Weakness 05/03/2014   Dysarthria 04/30/2014   AKI (acute kidney injury) ([place]) 04/30/2014   Hypotension 04/30/2014   Fall 04/30/2014   CVA (cerebral vascular accident) ([place]) 04/13/2014   Stroke ([place]) 04/13/2014   CAP (community acquired pneumonia) 02/13/2013   Musculoskeletal pain 02/13/2013   Hx of completed stroke 02/13/2013   Bipolar 1 disorder ([place]) 02/13/2013   Depression 02/13/2013   Essential hypertension 02/13/2013   H/O prostate cancer 02/13/2013   Nausea with vomiting 02/13/2013   GERD without esophagitis 02/13/2013   Poor balance 02/13/2013    Past Surgical History:  Procedure Laterality Date   BACK SURGERY     CARDIAC CATHETERIZATION     cant remember when  COLONOSCOPY N/A 10/16/2015   Procedure: COLONOSCOPY;  Surgeon: [name], MD;  Location: [facility] ENDOSCOPY;  Service: Gastroenterology;  Laterality: N/A;   COLONOSCOPY     ENTEROSCOPY N/A 04/09/2021   Procedure: ENTEROSCOPY;  Surgeon: Daryel November, MD;  Location: [facility] ENDOSCOPY;  Service: Gastroenterology;  Laterality: N/A;   ESOPHAGOGASTRODUODENOSCOPY N/A 10/15/2015   Procedure: ESOPHAGOGASTRODUODENOSCOPY (EGD);  Surgeon: [name], MD;  Location: [facility];  Service: Gastroenterology;  Laterality: N/A;   EYE SURGERY     cataracts   GIVENS CAPSULE STUDY N/A 10/16/2015   Procedure: GIVENS CAPSULE STUDY;  Surgeon: [name], MD;   Location: [facility];  Service: Gastroenterology;  Laterality: N/A;   HEMOSTASIS CLIP PLACEMENT  04/09/2021   Procedure: HEMOSTASIS CLIP PLACEMENT;  Surgeon: Daryel November, MD;  Location: [facility] ENDOSCOPY;  Service: Gastroenterology;;   HOT HEMOSTASIS  04/09/2021   Procedure: HOT HEMOSTASIS (ARGON PLASMA COAGULATION/BICAP);  Surgeon: Daryel November, MD;  Location: [facility] ENDOSCOPY;  Service: Gastroenterology;;   POLYPECTOMY     PROSTATE SURGERY     RADIOLOGY WITH ANESTHESIA N/A 05/22/2018   Procedure: MRI WITH ANESTHESIA/LUMBAR SPINE;  Surgeon: Radiologist, Medication, MD;  Location: [facility];  Service: Radiology;  Laterality: N/A;       Family History  Problem Relation Age of Onset   Stroke Mother        Hemorrhagic stroke   Cancer Father        Pancreatic cancer   Hypertension Father    Pancreatic cancer Father    Colon cancer Neg Hx    Esophageal cancer Neg Hx    Liver cancer Neg Hx    Rectal cancer Neg Hx    Stomach cancer Neg Hx     Social History   Tobacco Use   Smoking status: Former    Packs/day: 1.50    Years: 30.00    Pack years: 45.00    Types: Cigarettes    Quit date: 03/06/2003    Years since quitting: 18.3   Smokeless tobacco: Never  Vaping Use   Vaping Use: Every day  Substance Use Topics   Alcohol use: No    Alcohol/week: 0.0 standard drinks    Comment: drank years ago   Drug use: No    Home Medications Prior to Admission medications   Medication Sig Start Date End Date Taking? Authorizing Provider  ascorbic acid (VITAMIN C) 250 MG tablet TAKE ONE TABLET BY MOUTH MONDAY, [facility] AND FRIDAY 04/08/21  Yes [provider]  gabapentin (NEURONTIN) 600 MG tablet TAKE ONE TABLET BY MOUTH THREE TIMES A DAY RESTLESS LEGS SYNDROME 06/20/21  Yes [provider]  pantoprazole (PROTONIX) 40 MG tablet TAKE ONE TABLET BY MOUTH EVERY MORNING BEFORE A MEAL GERD (TAKE ON AN EMPTY STOMACH 30 MINUTES PRIOR TO A MEAL) 06/20/21  Yes [provider]  potassium chloride SA (KLOR-CON M) 20 MEQ tablet Take 1 tablet (20 mEq total) by mouth 2 (two) times daily for 5 days. 07/02/21 07/07/21 Yes Sherwood Gambler, MD  acetaminophen (TYLENOL) 500 MG tablet Take 1,000 mg by mouth every 6 (six) hours as needed. 07/10/20   [provider]  amLODipine (NORVASC) 10 MG tablet Take 10 mg by mouth daily.    [provider]  Ascorbic Acid (VITAMIN C) 1000 MG tablet Take 1,000 mg by mouth daily.    [provider]  aspirin EC 81 MG tablet Take 81 mg by mouth daily.    [provider]  atorvastatin (  LIPITOR) 40 MG tablet Take 1 tablet (40 mg total) by mouth daily at 6 PM. Patient taking differently: Take 40 mg by mouth at bedtime. 04/14/14   Mikhail, Velta Addison, DO  Carboxymethylcellulose Sodium (THERATEARS OP) Place 1 drop into both eyes 4 (four) times daily as needed (for dry eyes).     [provider]  ferrous sulfate 325 (65 FE) MG tablet Take 1 tablet (325 mg total) by mouth every Monday, Wednesday, and Friday. 04/10/21 05/10/21  Elodia Florence., MD  FLUoxetine (PROZAC) 20 MG capsule Take 20 mg by mouth daily.    [provider]  fluticasone (FLONASE) 50 MCG/ACT nasal spray Place 1 spray into both nostrils daily as needed for allergies or rhinitis.    [provider]  gabapentin (NEURONTIN) 300 MG capsule Take 1 capsule (300 mg total) by mouth 3 (three) times daily. Patient taking differently: Take 600 mg by mouth at bedtime. 01/12/17   Barton Dubois, MD  lurasidone (LATUDA) 20 MG TABS tablet Take 20 mg by mouth daily. 03/24/21   [provider]  methocarbamol (ROBAXIN) 750 MG tablet Take 750 mg by mouth every 8 (eight) hours as needed for muscle spasms. 05/07/20   [provider]  mirtazapine (REMERON) 45 MG tablet Take 45 mg by mouth at bedtime.    [provider]  pantoprazole (PROTONIX) 40 MG tablet Take 1 tablet (40 mg total) by mouth 2 (two) times daily.  04/10/21 06/09/21  Elodia Florence., MD  sucralfate (CARAFATE) 1 GM/10ML suspension Take 10 mLs (1 g total) by mouth 4 (four) times daily -  with meals and at bedtime for 14 days. 04/10/21 04/24/21  Elodia Florence., MD  tamsulosin (FLOMAX) 0.4 MG CAPS capsule Take 1 capsule (0.4 mg total) by mouth daily. Patient not taking: No sig reported 05/23/18   Florencia Reasons, MD  vitamin B-12 1000 MCG tablet Take 1 tablet (1,000 mcg total) by mouth daily. Patient not taking: No sig reported 05/23/18   Florencia Reasons, MD    Allergies    Lisinopril and Penicillins  Review of Systems   Review of Systems  Constitutional:  Negative for fever.  HENT:  Negative for sore throat.   Respiratory:  Negative for shortness of breath.   Cardiovascular:  Negative for chest pain.  Gastrointestinal:  Negative for abdominal pain, blood in stool, diarrhea and vomiting.  Genitourinary:  Negative for dysuria.  Neurological:  Positive for syncope and light-headedness. Negative for headaches.  All other systems reviewed and are negative.  Physical Exam Updated Vital Signs BP (!) 139/59    Pulse 77    Temp 97.7 F (36.5 C) (Oral)    Resp (!) 25    Ht 5\' 5"  (1.651 m)    Wt 63.5 kg    SpO2 98%    BMI 23.30 kg/m   Physical Exam Vitals and nursing note reviewed.  Constitutional:      General: He is not in acute distress.    Appearance: He is well-developed. He is not ill-appearing or diaphoretic.  HENT:     Head: Normocephalic and atraumatic.     Right Ear: External ear normal.     Left Ear: External ear normal.     Nose: Nose normal.  Eyes:     General:        Right eye: No discharge.        Left eye: No discharge.     Extraocular Movements: Extraocular movements intact.  Pupils: Pupils are equal, round, and reactive to light.  Cardiovascular:     Rate and Rhythm: Normal rate and regular rhythm.     Heart sounds: Normal heart sounds.  Pulmonary:     Effort: Pulmonary effort is normal.     Breath sounds:  Normal breath sounds.  Abdominal:     Palpations: Abdomen is soft.     Tenderness: There is no abdominal tenderness.  Musculoskeletal:     Cervical back: Neck supple.  Skin:    General: Skin is warm and dry.  Neurological:     Mental Status: He is alert.     Comments: CN 3-12 grossly intact. 5/5 strength in all 4 extremities. Grossly normal sensation. Normal finger to nose.   Psychiatric:        Mood and Affect: Mood is not anxious.    ED Results / Procedures / Treatments   Labs (all labs ordered are listed, but only abnormal results are displayed) Labs Reviewed  CBC WITH DIFFERENTIAL/PLATELET - Abnormal; Notable for the following components:      Result Value   RBC 3.41 (*)    Hemoglobin 7.9 (*)    HCT 27.4 (*)    MCH 23.2 (*)    MCHC 28.8 (*)    RDW 16.4 (*)    All other components within normal limits  COMPREHENSIVE METABOLIC PANEL - Abnormal; Notable for the following components:   Potassium 2.7 (*)    Creatinine, Ser 1.69 (*)    Calcium 8.8 (*)    Total Protein 6.4 (*)    GFR, Estimated 42 (*)    All other components within normal limits  RESP PANEL BY RT-PCR (FLU A&B, COVID) ARPGX2  MAGNESIUM  URINALYSIS, ROUTINE W REFLEX MICROSCOPIC    EKG EKG Interpretation  Date/Time:  Thursday July 02 2021 17:44:12 EST Ventricular Rate:  71 PR Interval:  230 QRS Duration: 99 QT Interval:  423 QTC Calculation: 460 R Axis:   46 Text Interpretation: Sinus rhythm Prolonged PR interval Confirmed by Sherwood Gambler [phone]) on 07/02/2021 5:46:02 PM  Radiology No results found.  Procedures Procedures   Medications Ordered in ED Medications  acetaminophen (TYLENOL) tablet 1,000 mg (has no administration in time range)  lactated ringers bolus 1,000 mL (0 mLs Intravenous Stopped 07/02/21 1918)  potassium chloride SA (KLOR-CON M) CR tablet 40 mEq (40 mEq Oral Given 07/02/21 1918)  potassium chloride 10 mEq in 100 mL IVPB (0 mEq Intravenous Stopped 07/02/21 2032)     ED Course  I have reviewed the triage vital signs and the nursing notes.  Pertinent labs & imaging results that were available during my care of the patient were reviewed by me and considered in my medical decision making (see chart for details).    MDM Rules/Calculators/A&P                         Patient currently feels well.  He is not hypotensive here.  He was orthostatic which goes along with what is the most likely cause of his syncope which was dehydration and poor p.o. intake.  He has been taking his blood pressure meds despite not eating well.  He is found to have a benign ECG besides mildly prolonged PR.  My suspicion for ACS, cardiac syncope, PE, CNS cause, or any other severe causes of syncope is pretty low.  He will have his potassium repleted.  He feels well enough for discharge.  He is on a  small dose of HCTZ so he will need to stop this with the hypokalemia and some dehydration.  While his creatinine is bumped a little I do not think he necessarily needs to be admitted and he feels comfortable going home.  Wife indicates he is not drinking as much as he should.  Will discharge home with return precautions.    Final Clinical Impression(s) / ED Diagnoses Final diagnoses:  Syncope and collapse  Hypokalemia  Dehydration    Rx / DC Orders ED Discharge Orders          Ordered    potassium chloride SA (KLOR-CON M) 20 MEQ tablet  2 times daily        07/02/21 2029             Sherwood Gambler, MD 07/02/21 2035

## 2021-07-02 NOTE — ED Triage Notes (Signed)
BIB [facility] from [place] in [facility]. Pt was standing in line. Pt got weak and bystanders noticed that we was looking pale. They assisted him to the ground and noted positive LOC. When EMS arrived he was pale, cool, and diaphoretic. BP 76/46. PT states he hasn't felt well the past few days and had poor intake but has continue to take BP meds.   112/62 72 heart rate 97% room air CBG 99

## 2022-02-27 ENCOUNTER — Emergency Department (HOSPITAL_COMMUNITY)
Admission: EM | Admit: 2022-02-27 | Discharge: 2022-03-01 | Disposition: A | Discharge status: Home / Self Care | Payer: No Typology Code available for payment source | Attending: Emergency Medicine | Admitting: Emergency Medicine

## 2022-02-27 ENCOUNTER — Emergency Department (HOSPITAL_COMMUNITY): Payer: No Typology Code available for payment source

## 2022-02-27 DIAGNOSIS — Z7982 Long term (current) use of aspirin: Secondary | ICD-10-CM | POA: Insufficient documentation

## 2022-02-27 DIAGNOSIS — Z79899 Other long term (current) drug therapy: Secondary | ICD-10-CM | POA: Diagnosis not present

## 2022-02-27 DIAGNOSIS — I10 Essential (primary) hypertension: Secondary | ICD-10-CM | POA: Diagnosis not present

## 2022-02-27 DIAGNOSIS — F17200 Nicotine dependence, unspecified, uncomplicated: Secondary | ICD-10-CM | POA: Diagnosis not present

## 2022-02-27 DIAGNOSIS — R000 Tachycardia, unspecified: Secondary | ICD-10-CM | POA: Diagnosis not present

## 2022-02-27 DIAGNOSIS — F430 Acute stress reaction: Secondary | ICD-10-CM

## 2022-02-27 DIAGNOSIS — R45851 Suicidal ideations: Secondary | ICD-10-CM | POA: Diagnosis not present

## 2022-02-27 DIAGNOSIS — R4589 Other symptoms and signs involving emotional state: Secondary | ICD-10-CM

## 2022-02-27 DIAGNOSIS — F314 Bipolar disorder, current episode depressed, severe, without psychotic features: Secondary | ICD-10-CM | POA: Diagnosis present

## 2022-02-27 DIAGNOSIS — Z20822 Contact with and (suspected) exposure to covid-19: Secondary | ICD-10-CM | POA: Diagnosis not present

## 2022-02-27 DIAGNOSIS — R531 Weakness: Secondary | ICD-10-CM | POA: Diagnosis not present

## 2022-02-27 DIAGNOSIS — F319 Bipolar disorder, unspecified: Secondary | ICD-10-CM | POA: Diagnosis present

## 2022-02-27 LAB — CBC WITH DIFFERENTIAL/PLATELET
Abs Immature Granulocytes: 0.04 10*3/uL (ref 0.00–0.07)
Basophils Absolute: 0.1 10*3/uL (ref 0.0–0.1)
Basophils Relative: 1 %
Eosinophils Absolute: 0 10*3/uL (ref 0.0–0.5)
Eosinophils Relative: 0 %
HCT: 43.1 % (ref 39.0–52.0)
Hemoglobin: 13.6 g/dL (ref 13.0–17.0)
Immature Granulocytes: 0 %
Lymphocytes Relative: 14 %
Lymphs Abs: 1.4 10*3/uL (ref 0.7–4.0)
MCH: 26.8 pg (ref 26.0–34.0)
MCHC: 31.6 g/dL (ref 30.0–36.0)
MCV: 85 fL (ref 80.0–100.0)
Monocytes Absolute: 1 10*3/uL (ref 0.1–1.0)
Monocytes Relative: 10 %
Neutro Abs: 7.4 10*3/uL (ref 1.7–7.7)
Neutrophils Relative %: 75 %
Platelets: 395 10*3/uL (ref 150–400)
RBC: 5.07 MIL/uL (ref 4.22–5.81)
RDW: 16.1 % — ABNORMAL HIGH (ref 11.5–15.5)
WBC: 9.9 10*3/uL (ref 4.0–10.5)
nRBC: 0 % (ref 0.0–0.2)

## 2022-02-27 LAB — URINALYSIS, ROUTINE W REFLEX MICROSCOPIC
Bilirubin Urine: NEGATIVE
Glucose, UA: NEGATIVE mg/dL
Hgb urine dipstick: NEGATIVE
Ketones, ur: 20 mg/dL — AB
Leukocytes,Ua: NEGATIVE
Nitrite: NEGATIVE
Protein, ur: 100 mg/dL — AB
Specific Gravity, Urine: 1.015 (ref 1.005–1.030)
pH: 5 (ref 5.0–8.0)

## 2022-02-27 LAB — COMPREHENSIVE METABOLIC PANEL
ALT: 11 U/L (ref 0–44)
AST: 15 U/L (ref 15–41)
Albumin: 4 g/dL (ref 3.5–5.0)
Alkaline Phosphatase: 122 U/L (ref 38–126)
Anion gap: 15 (ref 5–15)
BUN: 20 mg/dL (ref 8–23)
CO2: 19 mmol/L — ABNORMAL LOW (ref 22–32)
Calcium: 10.2 mg/dL (ref 8.9–10.3)
Chloride: 109 mmol/L (ref 98–111)
Creatinine, Ser: 1.67 mg/dL — ABNORMAL HIGH (ref 0.61–1.24)
GFR, Estimated: 42 mL/min — ABNORMAL LOW (ref >60)
Glucose, Bld: 92 mg/dL (ref 70–99)
Potassium: 4.5 mmol/L (ref 3.5–5.1)
Sodium: 143 mmol/L (ref 135–145)
Total Bilirubin: 0.6 mg/dL (ref 0.3–1.2)
Total Protein: 7.6 g/dL (ref 6.5–8.1)

## 2022-02-27 LAB — LACTIC ACID, PLASMA: Lactic Acid, Venous: 2.2 mmol/L (ref 0.5–1.9)

## 2022-02-27 LAB — APTT: aPTT: 30 seconds (ref 24–36)

## 2022-02-27 LAB — RESP PANEL BY RT-PCR (FLU A&B, COVID) ARPGX2
Influenza A by PCR: NEGATIVE
Influenza B by PCR: NEGATIVE
SARS Coronavirus 2 by RT PCR: NEGATIVE

## 2022-02-27 LAB — ACETAMINOPHEN LEVEL: Acetaminophen (Tylenol), Serum: <10 ug/mL — ABNORMAL LOW (ref 10–30)

## 2022-02-27 LAB — SALICYLATE LEVEL: Salicylate Lvl: <7 mg/dL — ABNORMAL LOW (ref 7.0–30.0)

## 2022-02-27 LAB — PROTIME-INR
INR: 1.1 (ref 0.8–1.2)
Prothrombin Time: 13.7 seconds (ref 11.4–15.2)

## 2022-02-27 MED ORDER — GABAPENTIN 300 MG PO CAPS
300.0000 mg | ORAL_CAPSULE | Freq: Three times a day (TID) | ORAL | Status: DC
  Administered 2022-02-27 – 2022-03-01 (×6): 300 mg via ORAL
  Filled 2022-02-27 (×6): qty 1

## 2022-02-27 MED ORDER — MIRTAZAPINE 15 MG PO TABS
45.0000 mg | ORAL_TABLET | Freq: Every day | ORAL | Status: DC
  Administered 2022-02-27 – 2022-02-28 (×2): 45 mg via ORAL
  Filled 2022-02-27 (×2): qty 3

## 2022-02-27 MED ORDER — ASPIRIN 81 MG PO TBEC
81.0000 mg | DELAYED_RELEASE_TABLET | Freq: Every day | ORAL | Status: DC
  Administered 2022-02-27 – 2022-03-01 (×3): 81 mg via ORAL
  Filled 2022-02-27 (×3): qty 1

## 2022-02-27 MED ORDER — SUCRALFATE 1 GM/10ML PO SUSP
1.0000 g | Freq: Three times a day (TID) | ORAL | Status: DC
Start: 2022-02-27 — End: 2022-03-01
  Administered 2022-02-27 – 2022-03-01 (×6): 1 g via ORAL
  Filled 2022-02-27 (×11): qty 10

## 2022-02-27 MED ORDER — PANTOPRAZOLE SODIUM 40 MG PO TBEC
40.0000 mg | DELAYED_RELEASE_TABLET | Freq: Two times a day (BID) | ORAL | Status: DC
  Administered 2022-02-27 – 2022-03-01 (×4): 40 mg via ORAL
  Filled 2022-02-27 (×4): qty 1

## 2022-02-27 MED ORDER — TAMSULOSIN HCL 0.4 MG PO CAPS
0.4000 mg | ORAL_CAPSULE | Freq: Every day | ORAL | Status: DC
  Administered 2022-02-28 – 2022-03-01 (×2): 0.4 mg via ORAL
  Filled 2022-02-27 (×4): qty 1

## 2022-02-27 MED ORDER — ASCORBIC ACID 500 MG PO TABS
1000.0000 mg | ORAL_TABLET | Freq: Every day | ORAL | Status: DC
Start: 2022-02-27 — End: 2022-03-01
  Administered 2022-02-28 – 2022-03-01 (×2): 1000 mg via ORAL
  Filled 2022-02-27 (×4): qty 2

## 2022-02-27 MED ORDER — LACTATED RINGERS IV BOLUS (SEPSIS)
1000.0000 mL | Freq: Once | INTRAVENOUS | Status: AC
  Administered 2022-02-27: 1000 mL via INTRAVENOUS

## 2022-02-27 MED ORDER — AMLODIPINE BESYLATE 5 MG PO TABS
10.0000 mg | ORAL_TABLET | Freq: Every day | ORAL | Status: DC
  Administered 2022-02-28 – 2022-03-01 (×2): 10 mg via ORAL
  Filled 2022-02-27 (×3): qty 2

## 2022-02-27 MED ORDER — ACETAMINOPHEN 325 MG PO TABS: 650.0000 mg | ORAL_TABLET | Freq: Four times a day (QID) | ORAL | Status: DC | PRN

## 2022-02-27 MED ORDER — POLYVINYL ALCOHOL 1.4 % OP SOLN: 1.0000 [drp] | Freq: Four times a day (QID) | OPHTHALMIC | Status: DC | PRN

## 2022-02-27 MED ORDER — FLUOXETINE HCL 20 MG PO CAPS
20.0000 mg | ORAL_CAPSULE | Freq: Every day | ORAL | Status: DC
  Administered 2022-02-27 – 2022-03-01 (×3): 20 mg via ORAL
  Filled 2022-02-27 (×3): qty 1

## 2022-02-27 MED ORDER — LURASIDONE HCL 20 MG PO TABS
20.0000 mg | ORAL_TABLET | Freq: Every day | ORAL | Status: DC
  Administered 2022-02-27 – 2022-02-28 (×2): 20 mg via ORAL
  Filled 2022-02-27 (×5): qty 1

## 2022-02-27 MED ORDER — CARBOXYMETHYLCELLULOSE SODIUM 0.25 % OP SOLN: Freq: Four times a day (QID) | OPHTHALMIC | Status: DC | PRN

## 2022-02-27 MED ORDER — VITAMIN B-12 1000 MCG PO TABS
1000.0000 ug | ORAL_TABLET | Freq: Every day | ORAL | Status: DC
  Administered 2022-02-27 – 2022-03-01 (×3): 1000 ug via ORAL
  Filled 2022-02-27 (×3): qty 1

## 2022-02-27 MED ORDER — ATORVASTATIN CALCIUM 40 MG PO TABS
40.0000 mg | ORAL_TABLET | Freq: Every day | ORAL | Status: DC
  Administered 2022-02-27 – 2022-02-28 (×2): 40 mg via ORAL
  Filled 2022-02-27 (×2): qty 1

## 2022-02-27 MED ORDER — FLUTICASONE PROPIONATE 50 MCG/ACT NA SUSP: 1.0000 | Freq: Every day | NASAL | Status: DC | PRN

## 2022-02-27 NOTE — ED Notes (Signed)
Pt provided meal tray at this time.
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
## 2022-02-27 NOTE — ED Triage Notes (Signed)
Weakness, dry cough and poor appetite x 5 days. Denies sob, chest pain, or any other discomfort. Reports chills at home.

## 2022-02-27 NOTE — ED Provider Notes (Signed)
[facility] EMERGENCY DEPARTMENT Provider Note   CSN: [id number] Arrival date & time: 02/27/22  1514     History  Chief Complaint  Patient presents with   Weakness    Jason [name] is a 76 y.o. male.   Weakness  This patient is a 76 year old male who has a history of hypertension high cholesterol, he is a prior heavy smoker but has been vaping for the last 20 years, he is a retired English as a second language teacher who is struggled with suicidal ideations and has a prior suicide attempt by carbon monoxide poisoning many years ago.  He presents to the hospital with a complaint of having hot and cold flashes over the last several days with increasing depression increasing fatigue not wanting to get out of bed, not eating or drinking and drove himself to the urgent care.  He was found to be slightly tachypneic, slightly tachycardic, slightly hypoxic and expressing thoughts of self-harm.  He was transported to the emergency department for further evaluation.  He denies objective fevers, denies any dysuria diarrhea or significant cough though he does state he coughs occasionally this is a chronic problem.  He does not feel short of breath he is not having any muscle pains abdominal pain or headache and has no blurred vision.  The patient has a flat affect and is not very forthcoming with information.  I did discuss his care with his wife over the phone who states that he in fact has not had much to eat in the last 3 days has been increasingly depressed and actually called 911 because of suicidal thoughts, the police responded, he did endorse suicidal ideations to his wife after she attempted to have a discussion with him about it.  The reports that he believes it is his blood pressure medication that is making him feel this way so he stopped taking it several days ago, it is losartan.  He also stopped taking the Prozac because it is not working  The patient denies hallucinations     Home  Medications Prior to Admission medications   Medication Sig Start Date End Date Taking? Authorizing Provider  acetaminophen (TYLENOL) 500 MG tablet Take 1,000 mg by mouth every 6 (six) hours as needed. 07/10/20   [provider]  amLODipine (NORVASC) 10 MG tablet Take 10 mg by mouth daily.    [provider]  Ascorbic Acid (VITAMIN C) 1000 MG tablet Take 1,000 mg by mouth daily.    [provider]  ascorbic acid (VITAMIN C) 250 MG tablet TAKE ONE TABLET BY MOUTH MONDAY, [facility] AND FRIDAY 04/08/21   [provider]  aspirin EC 81 MG tablet Take 81 mg by mouth daily.    [provider]  atorvastatin (LIPITOR) 40 MG tablet Take 1 tablet (40 mg total) by mouth daily at 6 PM. Patient taking differently: Take 40 mg by mouth at bedtime. 04/14/14   Mikhail, Velta Addison, DO  Carboxymethylcellulose Sodium (THERATEARS OP) Place 1 drop into both eyes 4 (four) times daily as needed (for dry eyes).     [provider]  ferrous sulfate 325 (65 FE) MG tablet Take 1 tablet (325 mg total) by mouth every Monday, Wednesday, and Friday. 04/10/21 05/10/21  Elodia Florence., MD  FLUoxetine (PROZAC) 20 MG capsule Take 20 mg by mouth daily.    [provider]  fluticasone (FLONASE) 50 MCG/ACT nasal spray Place 1 spray into both nostrils daily as needed for allergies or rhinitis.  [provider]  gabapentin (NEURONTIN) 300 MG capsule Take 1 capsule (300 mg total) by mouth 3 (three) times daily. Patient taking differently: Take 600 mg by mouth at bedtime. 01/12/17   Barton Dubois, MD  gabapentin (NEURONTIN) 600 MG tablet TAKE ONE TABLET BY MOUTH THREE TIMES A DAY RESTLESS LEGS SYNDROME 06/20/21   [provider]  lurasidone (LATUDA) 20 MG TABS tablet Take 20 mg by mouth daily. 03/24/21   [provider]  methocarbamol (ROBAXIN) 750 MG tablet Take 750 mg by mouth every 8 (eight) hours as needed for muscle spasms. 05/07/20   [provider]  mirtazapine (REMERON) 45 MG tablet Take 45 mg by mouth at bedtime.    [provider]  pantoprazole (PROTONIX) 40 MG tablet Take 1 tablet (40 mg total) by mouth 2 (two) times daily. 04/10/21 06/09/21  Elodia Florence., MD  pantoprazole (PROTONIX) 40 MG tablet TAKE ONE TABLET BY MOUTH EVERY MORNING BEFORE A MEAL GERD (TAKE ON AN EMPTY STOMACH 30 MINUTES PRIOR TO A MEAL) 06/20/21   [provider]  potassium chloride SA (KLOR-CON M) 20 MEQ tablet Take 1 tablet (20 mEq total) by mouth 2 (two) times daily for 5 days. 07/02/21 07/07/21  Sherwood Gambler, MD  sucralfate (CARAFATE) 1 GM/10ML suspension Take 10 mLs (1 g total) by mouth 4 (four) times daily -  with meals and at bedtime for 14 days. 04/10/21 04/24/21  Elodia Florence., MD  tamsulosin (FLOMAX) 0.4 MG CAPS capsule Take 1 capsule (0.4 mg total) by mouth daily. Patient not taking: No sig reported 05/23/18   Florencia Reasons, MD  vitamin B-12 1000 MCG tablet Take 1 tablet (1,000 mcg total) by mouth daily. Patient not taking: No sig reported 05/23/18   Florencia Reasons, MD      Allergies    Lisinopril and Penicillins    Review of Systems   Review of Systems  Neurological:  Positive for weakness.  All other systems reviewed and are negative.   Physical Exam Updated Vital Signs BP 136/87   Pulse (!) 102   Temp 98.7 F (37.1 C) (Oral)   Resp (!) 23   Ht 1.626 m (5\' 4" )   Wt 56.7 kg   SpO2 97%   BMI 21.46 kg/m  Physical Exam Vitals and nursing note reviewed.  Constitutional:      General: He is not in acute distress.    Appearance: He is well-developed.  HENT:     Head: Normocephalic and atraumatic.     Mouth/Throat:     Pharynx: No oropharyngeal exudate.  Eyes:     General: No scleral icterus.       Right eye: No discharge.        Left eye: No discharge.     Conjunctiva/sclera: Conjunctivae normal.     Pupils: Pupils are equal, round, and reactive to light.  Neck:     Thyroid: No thyromegaly.      Vascular: No JVD.  Cardiovascular:     Rate and Rhythm: Normal rate and regular rhythm.     Heart sounds: Normal heart sounds. No murmur heard.    No friction rub. No gallop.  Pulmonary:     Effort: Pulmonary effort is normal. No respiratory distress.     Breath sounds: Normal breath sounds. No wheezing or rales.  Abdominal:     General: Bowel sounds are normal. There is no distension.     Palpations: Abdomen is soft. There is no mass.  Tenderness: There is no abdominal tenderness.  Musculoskeletal:        General: No tenderness. Normal range of motion.     Cervical back: Normal range of motion and neck supple.     Right lower leg: No edema.     Left lower leg: No edema.  Lymphadenopathy:     Cervical: No cervical adenopathy.  Skin:    General: Skin is warm and dry.     Findings: No erythema or rash.  Neurological:     Mental Status: He is alert.     Coordination: Coordination normal.  Psychiatric:        Behavior: Behavior normal.     ED Results / Procedures / Treatments   Labs (all labs ordered are listed, but only abnormal results are displayed) Labs Reviewed  LACTIC ACID, PLASMA - Abnormal; Notable for the following components:      Result Value   Lactic Acid, Venous 2.2 (*)    All other components within normal limits  COMPREHENSIVE METABOLIC PANEL - Abnormal; Notable for the following components:   CO2 19 (*)    Creatinine, Ser 1.67 (*)    GFR, Estimated 42 (*)    All other components within normal limits  CBC WITH DIFFERENTIAL/PLATELET - Abnormal; Notable for the following components:   RDW 16.1 (*)    All other components within normal limits  ACETAMINOPHEN LEVEL - Abnormal; Notable for the following components:   Acetaminophen (Tylenol), Serum <10 (*)    All other components within normal limits  SALICYLATE LEVEL - Abnormal; Notable for the following components:   Salicylate Lvl <5.1 (*)    All other components within normal limits  CULTURE, BLOOD  (ROUTINE X 2)  CULTURE, BLOOD (ROUTINE X 2)  URINE CULTURE  RESP PANEL BY RT-PCR (FLU A&B, COVID) ARPGX2  PROTIME-INR  APTT  LACTIC ACID, PLASMA  URINALYSIS, ROUTINE W REFLEX MICROSCOPIC    EKG None  Radiology DG Chest Port 1 View  Result Date: 02/27/2022 CLINICAL DATA:  Weakness.  Possible sepsis. EXAM: PORTABLE CHEST 1 VIEW COMPARISON:  10/27/2021 FINDINGS: Normal sized heart. Mildly tortuous aorta. Stable mild elevation or eventration of the left hemidiaphragm. Clear lungs. Unremarkable bones. IMPRESSION: No active disease. Electronically Signed   By: Claudie Revering M.D.   On: 02/27/2022 15:40    Procedures Procedures    Medications Ordered in ED Medications  acetaminophen (TYLENOL) tablet 650 mg (has no administration in time range)  lactated ringers bolus 1,000 mL (0 mLs Intravenous Stopped 02/27/22 1708)    ED Course/ Medical Decision Making/ A&P                           Medical Decision Making Amount and/or Complexity of Data Reviewed Labs: ordered. Radiology: ordered. ECG/medicine tests: ordered.  Risk OTC drugs.   This patient presents to the ED for concern of fatigue, lethargy, slightly abnormal vital signs at the urgent care., this involves an extensive number of treatment options, and is a complaint that carries with it a high risk of complications and morbidity.  The differential diagnosis includes infection, electrolyte abnormalities, intentional overdose, worsening depression   Co morbidities that complicate the patient evaluation  Hypertension, hypercholesterolemia, bipolar 1 disorder   Additional history obtained:  Additional history obtained from spouse as well as the medical record External records from outside source obtained and reviewed including urgent care visit from earlier in the day Interestingly the patient had a visit at  an office visit on August 7 complaining of having cold sweats for the last 5 years intermittently, states he had had  it a few days prior to that.  He states "I cannot live like this my wife threw me out".   Lab Tests:  I Ordered, and personally interpreted labs.  The pertinent results include: Lactic acid of 2.2, potassium and sodium are normal, creatinine is 1.6 which seems similar to what it was in December 2022.  CBC shows no signs of anemia or leukocytosis, INR is 1.1, coingestions of acetaminophen and salicylate are undetectable and blood cultures are pending.   Imaging Studies ordered:  I ordered imaging studies including portable chest x-ray I independently visualized and interpreted imaging which showed no acute findings I agree with the radiologist interpretation   Cardiac Monitoring: / EKG:  The patient was maintained on a cardiac monitor.  I personally viewed and interpreted the cardiac monitored which showed an underlying rhythm of: Normal sinus rhythm EKG performed on February 27, 2022 at 3:26 PM showing normal sinus rhythm rate of 91 bpm, equivocal axis, normal intervals, normal ST segments, normal T waves, unremarkable EKG overall   Consultations Obtained:  I requested consultation with the behavioral health psychiatric team,  and discussed lab and imaging findings as well as pertinent plan - they recommend: Recommendations pending at the time of change of shift   Problem List / ED Course / Critical interventions / Medication management  The patient has had what appears to be worsening of his bipolar disorder, there is no laboratory evidence or physical exam evidence to suggest that he has an acute or ongoing infection or other pathologic process.  His fatigue and the feeling of being hot and cold, his worsening depression and now statements of suicidal ideations are concerning and likely the primary reason for admission to a psychiatric inpatient stay.  I feel very uncomfortable letting this patient go home knowing that he has a history of attempted suicide by carbon monoxide poisoning,  this is clearly established by the history obtained from his wife, he has also made suicidal comments today both to the 911 operator as well as to his wife prehospital and the urgent care providers. I ordered medication including acetaminophen and IV fluids for mild dehydration Reevaluation of the patient after these medicines showed that the patient no change I have reviewed the patients home medicines and have made adjustments as needed   Social Determinants of Health:  Bipolar disorder Vaping   Test / Admission - Considered:  I considered admission to the hospital, the patient will need to be admitted to a psychiatric unit due to his decompensated psychiatric state   I have discussed with the patient at the bedside the results, and the meaning of these results.  They have expressed her understanding to the need for follow-up with primary care physician  At this time, 6:00 PM the patient is formally cleared for psychiatric evaluation        Final Clinical Impression(s) / ED Diagnoses Final diagnoses:  Bipolar disorder, current episode depressed, severe, without psychotic features [facility])    Rx / DC Orders ED Discharge Orders     None         Noemi Chapel, MD 02/27/22 1758

## 2022-02-27 NOTE — BH Assessment (Signed)
Clinician did talk to Dr. Noemi Chapel who is recommending inpatient psychiatric care for patient.  He said that patient did say he wanted to harm himself to Dr. Sabra Heck.  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

## 2022-02-27 NOTE — ED Notes (Signed)
Pt talking with TTS at this time.

## 2022-02-27 NOTE — ED Notes (Signed)
Pt did not eat anything on meal tray. Pt stated "just because I said I haven't eaten in days, doesn't mean I want to eat now."

## 2022-02-28 LAB — URINE CULTURE
Culture: NO GROWTH
Special Requests: NORMAL

## 2022-02-28 NOTE — Progress Notes (Signed)
Per Dr. Noemi Chapel, patient meets criteria for inpatient treatment. There are no available  beds at [facility] today. CSW faxed referrals to the following facilities for review:  [facility]  Pending - No Request Sent N/A [address], [place] [address] [phone] [facility]  Pending - No Request Sent N/A [address], [place] [address] [phone] --  [place] [address] [place] [address] [phone] --  [facility]  Pending - No Request Sent N/A [address] [place] [address] [phone] --  [facility]  Pending - No Request Sent N/A [address] [place], [place] [address] [phone] --  [facility]  Pending - No Request Sent N/A [address] [place] [address] [phone] --  [place]  Pending - No Request Sent N/A [address], [place] [address] [phone] --  [facility]  Pending - No Request Sent N/A [address], [place] [address] [phone] --  [facility]  Pending - No Request Sent N/A [address], [place] [address] [phone] --   TTS will continue to seek bed placement.  Glennie Isle, MSW, [facility] [place] Phone: [phone] Disposition/TOC

## 2022-02-28 NOTE — ED Notes (Signed)
Pt continues to rest, observed even RR and unlabored, blanket on pt for warmth and comfort, lights off to room to help induce sleep, NAD noted, room secured, plan of care on going, no further concerns as of present

## 2022-02-28 NOTE — ED Notes (Signed)
Pt appears to be sleeping, observed even RR and unlabored, blanket on pt for warmth and comfort, lights off to room to help induce sleep, NAD noted, room secured, plan of care on going, no further concerns as of present

## 2022-02-28 NOTE — ED Provider Notes (Signed)
Emergency Medicine Observation Re-evaluation Note  Jason [name] is a 76 y.o. male, seen on rounds today.  Pt initially presented to the ED for complaints of Weakness Currently, the patient is resting awaiting placement.  Physical Exam  BP 109/77 (BP Location: Right Arm)   Pulse 94   Temp 98.1 F (36.7 C) (Oral)   Resp 18   Ht 5\' 4"  (1.626 m)   Wt 56.7 kg   SpO2 98%   BMI 21.46 kg/m  Physical Exam General: Resting reading the newspaper Cardiac: No murmur on my exam Lungs: Lungs clear Psych: No acute agitation or other complaints  ED Course / MDM  EKG:   I have reviewed the labs performed to date as well as medications administered while in observation.  Recent changes in the last 24 hours include none reported by nursing.  Plan  Current plan is for awaiting inpatient placement.  Jason [name] is not under involuntary commitment.     Isaias Dowson, Gwenyth Allegra, MD 02/28/22 2036

## 2022-02-28 NOTE — Progress Notes (Signed)
CSW faxed referral to [facility]/Fax#: [phone].   Glennie Isle, MSW, [place] [place] Phone: [phone] Disposition/TOC

## 2022-03-01 DIAGNOSIS — R45851 Suicidal ideations: Secondary | ICD-10-CM

## 2022-03-01 NOTE — ED Notes (Signed)
Patient given discharge instructions, all questions answered. Patient in possession of all belongings, directed to the discharge area  

## 2022-03-01 NOTE — ED Provider Notes (Signed)
Emergency Medicine Observation Re-evaluation Note  Jason [name] is a 76 y.o. male, seen on rounds today.  Pt initially presented to the ED for complaints of Weakness Currently, the patient is resting comfortably, no complaints through night.  Physical Exam  BP 109/77 (BP Location: Right Arm)   Pulse 94   Temp 98.1 F (36.7 C) (Oral)   Resp 18   Ht 5\' 4"  (1.626 m)   Wt 56.7 kg   SpO2 98%   BMI 21.46 kg/m  Physical Exam Vitals and nursing note reviewed.  Cardiovascular:     Rate and Rhythm: Normal rate.  Pulmonary:     Effort: Pulmonary effort is normal.  Skin:    General: Skin is warm and dry.      ED Course / MDM  EKG:   I have reviewed the labs performed to date as well as medications administered while in observation.  Recent changes in the last 24 hours include none.  Plan  Current plan is for psychiatric placement, [facility].  Jason [name] is not under involuntary commitment.     Jason [name], Jason Cornea, MD 03/01/22 [phone]

## 2022-03-01 NOTE — Consult Note (Signed)
[name]   ED Assessment Time Calculation: Start Time: 1000 Stop Time: 1030 Total Time in Minutes (Assessment Completion): 30   Subjective:   Jason [name] is a 76 y.o. male patient admitted to [facility] ED due to concerns of suicidal ideations and increased depression.  Wife expresses he has some temper and anger issues.  Patient has 1 suicide attempt that happened 45 years ago.  HPI:   Patient seen in his room at [facility] ED for face-to-face evaluation.  He is alert and oriented x4, able to engage in coherent and logical conversation.  He tells me he feels like there is been a big misunderstanding.  He says he was asked the question "if he wants to hurt other people" in which she responded "well I would rather hurt myself than her other people."  He thought that was the right thing to say but states "clearly got me in trouble."  Patient tells me he does have previous trauma.  His best friend shot himself when he was 39 years old and he walked in and found the body.  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  He states he is been previously diagnosed with PTSD and bipolar 1 disorder.  He has struggled with depression and did try and kill himself 45 years ago.  He states he has not tried to self-harm or kill himself since that 1 time.  He states he is happily married and feels like his wife is very supportive.  He denies any current suicidal or homicidal ideations.  He states " I know what it is like for a best friend to leave this earth especially by suicide and I would never want to cause that pain on any family or friends that I have."  He denies any auditory visual hallucinations.  He says he sleeps well when he has his  Remeron at night.  He does express frustration that his Prozac was decreased to 20 mg.  He asked me to increase this medication, however I told him I will not adjust any medications while in the ED due to me not being able to follow-up with him.  He is agreeable to this and states that he is going to acquire psychiatry outpatient care through the [place].  He says he has missed a lot of appointments due to his wife not be able to drive anymore and sometimes he does not feel capable of driving to his appointments.  We talked about how he can call the [place] and they will be able to set up tele appointments for him as he does have a computer at home.  He is wanting to go home today.  He denies any access to weapons or firearms.  He does endorse smoking marijuana to help with joint pain.  Denies any other illicit substances.  Denies drinking alcohol.  He does not knowledge that he has anger issues and has a quick temper.  He states he previously went to therapy for anger which was helpful for him.  He would like to get started in therapy again.  We also spoke about community resources such as behavioral health urgent care where he can walk-in for outpatient care and therapy.  He did  agree to let me speak to his wife.  He is able to contract for safety at this time.  I spoke to his wife, Jason [name], at [phone].  She tells me she is okay with him coming home especially if he feels up to it.  She denies any suicidal behaviors or statements at home.  She does acknowledge his anger and temper can be bad at times, and she does feel like when he was on a higher dose of Prozac it helps with his anger.  She acknowledges community resources such as [facility], and the importance of him seeing a psychiatrist.  Also spoke about the options of seeing a psychiatrist via computer or phone call.  She expressed understanding of resources and the importance of getting him back in therapy and outpatient care.  She denies there being any  access to weapons or firearms in the home.  She feels safe with him discharging home today.  She is unable to drive but her daughter will be coming to pick him up.  Past Psychiatric History:  Reported history of bipolar 1 disorder, cannabis use, and PTSD.  Risk to Self or Others: Is the patient at risk to self? No Has the patient been a risk to self in the past 6 months? No Has the patient been a risk to self within the distant past? Yes Is the patient a risk to others? No Has the patient been a risk to others in the past 6 months? No Has the patient been a risk to others within the distant past? No  [place] Scale:  [place] ED from 02/27/2022 in [place] ED from 07/02/2021 in [place] ED to Hosp-Admission (Discharged) from 04/08/2021 in [place] No Risk No Risk No Risk       Substance Abuse:  Alcohol / Drug Use Pain Medications: None now. Prescriptions: Prozac and another mental health med. Over the Counter: None History of alcohol / drug use?: No history of alcohol / drug abuse (Has not drank regularly in years.)  Past Medical History:  Past Medical History:  Diagnosis Date   Acid reflux disease with ulcer    AKI (acute kidney injury) ([place])    Anemia    Arthritis    Bipolar 1 disorder ([place])    Blood transfusion without reported diagnosis    Carotid stenosis    Cataract    Chronic back pain    Complication of anesthesia 2000   reaction to anesthesia had some swelling    Depression    GERD (gastroesophageal reflux disease)    Hearing loss    Hyperlipidemia    Hypertension    Prostate cancer ([place])    prostate cancer   PTSD (post-traumatic stress disorder)    Stroke ([place]) 08/2015   Substance abuse ([place])    TIA (transient ischemic attack)    has had several mini strokes    Past Surgical History:  Procedure Laterality Date   BACK  SURGERY     CARDIAC CATHETERIZATION     cant remember when   COLONOSCOPY N/A 10/16/2015   Procedure: COLONOSCOPY;  Surgeon: [name], MD;  Location: [place];  Service: Gastroenterology;  Laterality: N/A;   COLONOSCOPY     ENTEROSCOPY N/A 04/09/2021   Procedure: ENTEROSCOPY;  Surgeon: Daryel November, MD;  Location: [facility] ENDOSCOPY;  Service: Gastroenterology;  Laterality: N/A;   ESOPHAGOGASTRODUODENOSCOPY N/A 10/15/2015  Procedure: ESOPHAGOGASTRODUODENOSCOPY (EGD);  Surgeon: [name], MD;  Location: [place];  Service: Gastroenterology;  Laterality: N/A;   EYE SURGERY     cataracts   GIVENS CAPSULE STUDY N/A 10/16/2015   Procedure: GIVENS CAPSULE STUDY;  Surgeon: [name], MD;  Location: [place];  Service: Gastroenterology;  Laterality: N/A;   HEMOSTASIS CLIP PLACEMENT  04/09/2021   Procedure: HEMOSTASIS CLIP PLACEMENT;  Surgeon: Daryel November, MD;  Location: [facility] ENDOSCOPY;  Service: Gastroenterology;;   HOT HEMOSTASIS  04/09/2021   Procedure: HOT HEMOSTASIS (ARGON PLASMA COAGULATION/BICAP);  Surgeon: Daryel November, MD;  Location: [facility] ENDOSCOPY;  Service: Gastroenterology;;   POLYPECTOMY     PROSTATE SURGERY     RADIOLOGY WITH ANESTHESIA N/A 05/22/2018   Procedure: MRI WITH ANESTHESIA/LUMBAR SPINE;  Surgeon: Radiologist, Medication, MD;  Location: [place];  Service: Radiology;  Laterality: N/A;   Family History:  Family History  Problem Relation Age of Onset   Stroke Mother        Hemorrhagic stroke   Cancer Father        Pancreatic cancer   Hypertension Father    Pancreatic cancer Father    Colon cancer Neg Hx    Esophageal cancer Neg Hx    Liver cancer Neg Hx    Rectal cancer Neg Hx    Stomach cancer Neg Hx    Social History:  Social History   Substance and Sexual Activity  Alcohol Use No   Alcohol/week: 0.0 standard drinks of alcohol   Comment: drank years ago     Social History   Substance and Sexual  Activity  Drug Use No    Social History   Socioeconomic History   Marital status: Married    Spouse name: Not on file   Number of children: Not on file   Years of education: Not on file   Highest education level: Not on file  Occupational History   Not on file  Tobacco Use   Smoking status: Former    Packs/day: 1.50    Years: 30.00    Total pack years: 45.00    Types: Cigarettes    Quit date: 03/06/2003    Years since quitting: 19.0   Smokeless tobacco: Never  Vaping Use   Vaping Use: Every day  Substance and Sexual Activity   Alcohol use: No    Alcohol/week: 0.0 standard drinks of alcohol    Comment: drank years ago   Drug use: No   Sexual activity: Never  Other Topics Concern   Not on file  Social History Narrative   Not on file   Social Determinants of Health   Financial Resource Strain: Not on file  Food Insecurity: Not on file  Transportation Needs: Not on file  Physical Activity: Not on file  Stress: Not on file  Social Connections: Not on file   Additional Social History:    Allergies:   Allergies  Allergen Reactions   Lisinopril     sweating   Penicillins Other (See Comments)    Reaction:  Unknown  Has patient had a PCN reaction causing immediate rash, facial/tongue/throat swelling, SOB or lightheadedness with hypotension: Unknown Has patient had a PCN reaction causing severe rash involving mucus membranes or skin necrosis: Unknown Has patient had a PCN reaction that required hospitalization: Unknown Has patient had a PCN reaction occurring within the last 10 years: No If all of the above answers are "NO", then may proceed with Cephalosporin use.  Labs:  Results for orders placed or performed during the hospital encounter of 02/27/22 (from the past 48 hour(s))  Blood Culture (routine x 2)     Status: None (Preliminary result)   Collection Time: 02/27/22  3:20 PM   Specimen: BLOOD  Result Value Ref Range   Specimen Description BLOOD RIGHT  ANTECUBITAL    Special Requests      BOTTLES DRAWN AEROBIC AND ANAEROBIC Blood Culture adequate volume   Culture      NO GROWTH 2 DAYS Performed at [facility] [address] [place], [place] [address]    Report Status PENDING   Urine Culture     Status: None   Collection Time: 02/27/22  3:20 PM   Specimen: In/Out Cath Urine  Result Value Ref Range   Specimen Description IN/OUT CATH URINE    Special Requests Normal    Culture      NO GROWTH Performed at [facility], [place] [address], [place], [place] [address]    Report Status 02/28/2022 FINAL   Blood Culture (routine x 2)     Status: None (Preliminary result)   Collection Time: 02/27/22  3:25 PM   Specimen: BLOOD  Result Value Ref Range   Specimen Description BLOOD SITE NOT SPECIFIED    Special Requests      BOTTLES DRAWN AEROBIC AND ANAEROBIC Blood Culture results may not be optimal due to an inadequate volume of blood received in culture bottles   Culture      NO GROWTH 2 DAYS Performed at [facility], [place] [address] [place], [place] [address]    Report Status PENDING   Lactic acid, plasma     Status: Abnormal   Collection Time: 02/27/22  3:44 PM  Result Value Ref Range   Lactic Acid, Venous 2.2 (HH) 0.5 - 1.9 mmol/L    Comment: CRITICAL RESULT CALLED TO, READ BACK BY AND VERIFIED WITH Theron Arista, [place], 02/27/22, EADEDOKUN Performed at [facility], [place] [address] [place], [place] [address]   Comprehensive metabolic panel     Status: Abnormal   Collection Time: 02/27/22  3:44 PM  Result Value Ref Range   Sodium 143 135 - 145 mmol/L   Potassium 4.5 3.5 - 5.1 mmol/L   Chloride 109 98 - 111 mmol/L   CO2 19 (L) 22 - 32 mmol/L   Glucose, Bld 92 70 - 99 mg/dL    Comment: Glucose reference range applies only to samples taken after fasting for at least 8 hours.   BUN 20 8 - 23 mg/dL   Creatinine, Ser 1.67 (H) 0.61 - 1.24 mg/dL   Calcium 10.2 8.9 - 10.3 mg/dL   Total Protein 7.6 6.5 - 8.1  g/dL   Albumin 4.0 3.5 - 5.0 g/dL   AST 15 15 - 41 U/L   ALT 11 0 - 44 U/L   Alkaline Phosphatase 122 38 - 126 U/L   Total Bilirubin 0.6 0.3 - 1.2 mg/dL   GFR, Estimated 42 (L) >60 mL/min    Comment: (NOTE) Calculated using the CKD-EPI Creatinine Equation (2021)    Anion gap 15 5 - 15    Comment: Performed at [facility] [address] [place], [place] [address]  CBC with Differential     Status: Abnormal   Collection Time: 02/27/22  3:44 PM  Result Value Ref Range   WBC 9.9 4.0 - 10.5 K/uL   RBC 5.07 4.22 -  5.81 MIL/uL   Hemoglobin 13.6 13.0 - 17.0 g/dL   HCT 43.1 39.0 - 52.0 %   MCV 85.0 80.0 - 100.0 fL   MCH 26.8 26.0 - 34.0 pg   MCHC 31.6 30.0 - 36.0 g/dL   RDW 16.1 (H) 11.5 - 15.5 %   Platelets 395 150 - 400 K/uL   nRBC 0.0 0.0 - 0.2 %   Neutrophils Relative % 75 %   Neutro Abs 7.4 1.7 - 7.7 K/uL   Lymphocytes Relative 14 %   Lymphs Abs 1.4 0.7 - 4.0 K/uL   Monocytes Relative 10 %   Monocytes Absolute 1.0 0.1 - 1.0 K/uL   Eosinophils Relative 0 %   Eosinophils Absolute 0.0 0.0 - 0.5 K/uL   Basophils Relative 1 %   Basophils Absolute 0.1 0.0 - 0.1 K/uL   Immature Granulocytes 0 %   Abs Immature Granulocytes 0.04 0.00 - 0.07 K/uL    Comment: Performed at [facility] [address], [place], [place] [address]  Protime-INR     Status: None   Collection Time: 02/27/22  3:44 PM  Result Value Ref Range   Prothrombin Time 13.7 11.4 - 15.2 seconds   INR 1.1 0.8 - 1.2    Comment: (NOTE) INR goal varies based on device and disease states. Performed at [facility], [place] [address], [place], [place] [address]   APTT     Status: None   Collection Time: 02/27/22  3:44 PM  Result Value Ref Range   aPTT 30 24 - 36 seconds    Comment: Performed at [facility] [address], [place], [place] [address]  Acetaminophen level     Status: Abnormal   Collection Time: 02/27/22  3:44 PM  Result Value Ref Range   Acetaminophen (Tylenol), Serum <10 (L)  10 - 30 ug/mL    Comment: (NOTE) Therapeutic concentrations vary significantly. A range of 10-30 ug/mL  may be an effective concentration for many patients. However, some  are best treated at concentrations outside of this range. Acetaminophen concentrations >150 ug/mL at 4 hours after ingestion  and >50 ug/mL at 12 hours after ingestion are often associated with  toxic reactions.  Performed at [facility], [place] [address], [place], [place] [address]   Salicylate level     Status: Abnormal   Collection Time: 02/27/22  3:44 PM  Result Value Ref Range   Salicylate Lvl <8.8 (L) 7.0 - 30.0 mg/dL    Comment: Performed at [facility] [address], [place], [place] [address]  Resp Panel by RT-PCR (Flu A&B, Covid) Anterior Nasal Swab     Status: None   Collection Time: 02/27/22  3:47 PM   Specimen: Anterior Nasal Swab  Result Value Ref Range   SARS Coronavirus 2 by RT PCR NEGATIVE NEGATIVE    Comment: (NOTE) SARS-CoV-2 target nucleic acids are NOT DETECTED.  The SARS-CoV-2 RNA is generally detectable in upper respiratory specimens during the acute phase of infection. The lowest concentration of SARS-CoV-2 viral copies this assay can detect is 138 copies/mL. A negative result does not preclude SARS-Cov-2 infection and should not be used as the sole basis for treatment or other patient management decisions. A negative result may occur with  improper specimen collection/handling, submission of specimen other than nasopharyngeal swab, presence of viral mutation(s) within the areas targeted by this assay, and inadequate number of viral copies(<138 copies/mL). A negative result must be combined with  clinical observations, patient history, and epidemiological information. The expected result is Negative.  Fact Sheet for Patients:  EntrepreneurPulse.com.au  Fact Sheet for Healthcare Providers:  IncredibleEmployment.be  This test is no t  yet approved or cleared by the [place] FDA and  has been authorized for detection and/or diagnosis of SARS-CoV-2 by FDA under an Emergency Use Authorization (EUA). This EUA will remain  in effect (meaning this test can be used) for the duration of the COVID-19 declaration under Section 564(b)(1) of the Act, 21 U.S.C.section 360bbb-3(b)(1), unless the authorization is terminated  or revoked sooner.       Influenza A by PCR NEGATIVE NEGATIVE   Influenza B by PCR NEGATIVE NEGATIVE    Comment: (NOTE) The Xpert Xpress SARS-CoV-2/FLU/RSV plus assay is intended as an aid in the diagnosis of influenza from Nasopharyngeal swab specimens and should not be used as a sole basis for treatment. Nasal washings and aspirates are unacceptable for Xpert Xpress SARS-CoV-2/FLU/RSV testing.  Fact Sheet for Patients: EntrepreneurPulse.com.au  Fact Sheet for Healthcare Providers: IncredibleEmployment.be  This test is not yet approved or cleared by the [place] FDA and has been authorized for detection and/or diagnosis of SARS-CoV-2 by FDA under an Emergency Use Authorization (EUA). This EUA will remain in effect (meaning this test can be used) for the duration of the COVID-19 declaration under Section 564(b)(1) of the Act, 21 U.S.C. section 360bbb-3(b)(1), unless the authorization is terminated or revoked.  Performed at [facility], [place] [address] [place], [place] [address]   Urinalysis, Routine w reflex microscopic     Status: Abnormal   Collection Time: 02/27/22  6:08 PM  Result Value Ref Range   Color, Urine YELLOW YELLOW   APPearance HAZY (A) CLEAR   Specific Gravity, Urine 1.015 1.005 - 1.030   pH 5.0 5.0 - 8.0   Glucose, UA NEGATIVE NEGATIVE mg/dL   Hgb urine dipstick NEGATIVE NEGATIVE   Bilirubin Urine NEGATIVE NEGATIVE   Ketones, ur 20 (A) NEGATIVE mg/dL   Protein, ur 100 (A) NEGATIVE mg/dL   Nitrite NEGATIVE NEGATIVE    Leukocytes,Ua NEGATIVE NEGATIVE   RBC / HPF 0-5 0 - 5 RBC/hpf   WBC, UA 0-5 0 - 5 WBC/hpf   Bacteria, UA RARE (A) NONE SEEN   Squamous Epithelial / LPF 0-5 0 - 5   Mucus PRESENT    Hyaline Casts, UA PRESENT    Granular Casts, UA PRESENT     Comment: Performed at [facility], [place] [address] [place], [place] [address]    Current Facility-Administered Medications  Medication Dose Route Frequency Provider Last Rate Last Admin   acetaminophen (TYLENOL) tablet 650 mg  650 mg Oral Q6H PRN Noemi Chapel, MD       amLODipine (NORVASC) tablet 10 mg  10 mg Oral Daily Noemi Chapel, MD   10 mg at 03/01/22 1031   ascorbic acid (VITAMIN C) tablet 1,000 mg  1,000 mg Oral Daily Noemi Chapel, MD   1,000 mg at 03/01/22 1031   aspirin EC tablet 81 mg  81 mg Oral Daily Noemi Chapel, MD   81 mg at 03/01/22 1031   atorvastatin (LIPITOR) tablet 40 mg  40 mg Oral QHS Noemi Chapel, MD   40 mg at 02/28/22 2243   cyanocobalamin (VITAMIN B12) tablet 1,000 mcg  1,000 mcg Oral Daily Noemi Chapel, MD   1,000 mcg at 03/01/22 1032   FLUoxetine (PROZAC) capsule 20 mg  20 mg Oral Daily Noemi Chapel, MD  20 mg at 03/01/22 1030   fluticasone (FLONASE) 50 MCG/ACT nasal spray 1 spray  1 spray Each Nare Daily PRN Noemi Chapel, MD       gabapentin (NEURONTIN) capsule 300 mg  300 mg Oral TID Noemi Chapel, MD   300 mg at 03/01/22 1032   lurasidone (LATUDA) tablet 20 mg  20 mg Oral Daily Noemi Chapel, MD   20 mg at 02/28/22 0950   mirtazapine (REMERON) tablet 45 mg  45 mg Oral Carole Binning, MD   45 mg at 02/28/22 2243   pantoprazole (PROTONIX) EC tablet 40 mg  40 mg Oral BID Noemi Chapel, MD   40 mg at 03/01/22 1030   polyvinyl alcohol (LIQUIFILM TEARS) 1.4 % ophthalmic solution 1 drop  1 drop Both Eyes QID PRN Noemi Chapel, MD       sucralfate (CARAFATE) 1 GM/10ML suspension 1 g  1 g Oral TID WC & HS Noemi Chapel, MD   1 g at 03/01/22 0815   tamsulosin (FLOMAX) capsule 0.4 mg  0.4 mg Oral Daily Noemi Chapel, MD   0.4 mg at 03/01/22 1031   Current Outpatient Medications  Medication Sig Dispense Refill   acetaminophen (TYLENOL) 500 MG tablet Take 1,000 mg by mouth 3 (three) times daily as needed for mild pain.     amLODipine (NORVASC) 10 MG tablet Take 10 mg by mouth daily.     ascorbic acid (VITAMIN C) 250 MG tablet Take 250 mg by mouth every Monday, Wednesday, and Friday.     cyclobenzaprine (FLEXERIL) 10 MG tablet Take 10 mg by mouth 2 (two) times daily as needed (neck pain).     ferrous sulfate 325 (65 FE) MG tablet Take 1 tablet (325 mg total) by mouth every Monday, Wednesday, and Friday. 12 tablet 0   FLUoxetine (PROZAC) 20 MG capsule Take 20 mg by mouth daily.     gabapentin (NEURONTIN) 600 MG tablet Take 600 mg by mouth 3 (three) times daily. Restless legs syndrome     lidocaine (LIDODERM) 5 % Place 1 patch onto the skin daily. Apply 1 patch to skin once daily for back pain (Apply for 12 hours, then remove for 12 hours)     lurasidone (LATUDA) 20 MG TABS tablet Take 20 mg by mouth daily after supper.     pantoprazole (PROTONIX) 40 MG tablet Take 1 tablet (40 mg total) by mouth 2 (two) times daily. (Patient taking differently: Take 40 mg by mouth in the morning.) 60 tablet 1   vitamin B-12 (CYANOCOBALAMIN) 500 MCG tablet Take 1,000 mcg by mouth daily.     atorvastatin (LIPITOR) 40 MG tablet Take 1 tablet (40 mg total) by mouth daily at 6 PM. (Patient not taking: Reported on 02/28/2022) 30 tablet 0   gabapentin (NEURONTIN) 300 MG capsule Take 1 capsule (300 mg total) by mouth 3 (three) times daily. (Patient not taking: Reported on 02/28/2022) 90 capsule 0   potassium chloride SA (KLOR-CON M) 20 MEQ tablet Take 1 tablet (20 mEq total) by mouth 2 (two) times daily for 5 days. (Patient not taking: Reported on 02/28/2022) 10 tablet 0   sucralfate (CARAFATE) 1 GM/10ML suspension Take 10 mLs (1 g total) by mouth 4 (four) times daily -  with meals and at bedtime for 14 days. (Patient not taking:  Reported on 02/28/2022) 560 mL 0   tamsulosin (FLOMAX) 0.4 MG CAPS capsule Take 1 capsule (0.4 mg total) by mouth daily. (Patient not taking: Reported on 04/08/2021) 30  capsule 0   Psychiatric Specialty Exam: Presentation  General Appearance: Appropriate for Environment  Eye Contact:Good  Speech:Clear and Coherent  Speech Volume:Normal  Handedness:No data recorded  Mood and Affect  Mood:Euthymic  Affect:Congruent   Thought Process  Thought Processes:Coherent  Descriptions of Associations:Intact  Orientation:Full (Time, Place and Person)  Thought Content:Logical  History of Schizophrenia/Schizoaffective disorder:No  Duration of Psychotic Symptoms:No data recorded Hallucinations:Hallucinations: None  Ideas of Reference:None  Suicidal Thoughts:Suicidal Thoughts: No  Homicidal Thoughts:Homicidal Thoughts: No   Sensorium  Memory:Immediate Fair; Recent Fair  Judgment:Good  Insight:Good   Executive Functions  Concentration:Good  Attention Span:Good  [place] of Knowledge:Good  Language:Good   Psychomotor Activity  Psychomotor Activity:Psychomotor Activity: Normal   Assets  Assets:Communication Skills; Desire for Improvement; Physical Health; Resilience; Social Support    Sleep  Sleep:Sleep: Fair   Physical Exam: Physical Exam Neurological:     Mental Status: He is alert and oriented to person, place, and time.  Psychiatric:        Speech: Speech normal.        Behavior: Behavior is cooperative.        Thought Content: Thought content normal.    Review of Systems  Psychiatric/Behavioral:  Positive for depression and substance abuse.   All other systems reviewed and are negative.  Blood pressure 130/74, pulse 84, temperature 97.8 F (36.6 C), temperature source Oral, resp. rate 16, height 5\' 4"  (1.626 m), weight 56.7 kg, SpO2 96 %. Body mass index is 21.46 kg/m.  Medical Decision Making: Patient case reviewed and discussed  with Dr. Dwyane Dee.  Patient does not meet criteria for inpatient psychiatric treatment.  He is able to contract for safety and is requesting discharge home.  His wife, Jason [name], is agreeable with this plan and feels safe with his return home.  EDP and RN notified of disposition.  - resources for outpatient psychiatry and therapy added to AVS -Continue home medications  Disposition: No evidence of imminent risk to self or others at present.   Patient does not meet criteria for psychiatric inpatient admission. Supportive therapy provided about ongoing stressors. Discussed crisis plan, support from social network, calling 911, coming to the Emergency Department, and calling Suicide Hotline.  Vesta Mixer, NP 03/01/2022 12:36 PM

## 2022-03-01 NOTE — ED Notes (Signed)
Psychiatry PA at bedside speaking with patieny

## 2022-03-01 NOTE — ED Provider Notes (Signed)
Emergency Medicine Observation Re-evaluation Note  Jason [name] is a 76 y.o. male, seen on rounds today.  Pt initially presented to the ED for complaints of generally feeling weak, and also recent stressors, and intermittent feelings of depression. Pt reports feeling improved today. Normal appetite. He has been psych cleared for d/c by bh team an expressed optimism about being above to leave ED and follow up as outpatient. No new c/o today.   Physical Exam  BP 130/74   Pulse 84   Temp 97.8 F (36.6 C) (Oral)   Resp 16   Ht 1.626 m (5\' 4" )   Wt 56.7 kg   SpO2 96%   BMI 21.46 kg/m  Physical Exam General: alert, content.  Cardiac: regular rate. Lungs: breathing comfortably. Psych:  normal mood and affect. Pt does not appear acutely depressed or despondent - indicates is looking forward to going home today, and is agreeable to outpt f/u. Pt is not responding  to internal stimuli. No acute psychosis. No thoughts of harm to self or others.   ED Course / MDM   I have reviewed the labs performed to date as well as medications administered while in observation.  Recent changes in the last 24 hours include ED obs, reassessment.   Plan    Jason [name] is not under involuntary commitment.  Pt psych cleared for d/c by [facility] team. Pt currently appears stable for d/c.  Rec close pcp and bh f/u.      Lajean Saver, MD 03/01/22 [phone]

## 2022-03-01 NOTE — Discharge Instructions (Addendum)
It was our pleasure to provide your ER care today - we hope that you feel better.  Follow up with closely with primary care doctor and behavioral health provider in the next 1-2 weeks.  Please coordinate follow up through your primary care doctor. See resource guide attached for additional behavioral health resources.   For mental health issues and/or crisis, you may also go directly to the [facility] - it is open 24/7 and walk-ins are welcome.  Return to ER if worse, new symptoms, chest pain, trouble breathing, or other emergency concern.

## 2022-03-04 LAB — CULTURE, BLOOD (ROUTINE X 2)
Culture: NO GROWTH
Culture: NO GROWTH
Special Requests: ADEQUATE

## 2022-09-02 ENCOUNTER — Encounter: Payer: Self-pay | Admitting: Gastroenterology
